# Patient Record
Sex: Male | Born: 1955 | Race: Black or African American | Hispanic: No | Marital: Married | Smoking: Current every day smoker
Health system: Southern US, Community
[De-identification: ages and names within clinical notes are randomized; demographics above are authoritative.]

## PROBLEM LIST (undated history)

## (undated) DIAGNOSIS — Z992 Dependence on renal dialysis: Secondary | ICD-10-CM

## (undated) DIAGNOSIS — I10 Essential (primary) hypertension: Secondary | ICD-10-CM

## (undated) DIAGNOSIS — I509 Heart failure, unspecified: Secondary | ICD-10-CM

## (undated) DIAGNOSIS — J189 Pneumonia, unspecified organism: Secondary | ICD-10-CM

## (undated) DIAGNOSIS — Z87442 Personal history of urinary calculi: Secondary | ICD-10-CM

## (undated) DIAGNOSIS — D649 Anemia, unspecified: Secondary | ICD-10-CM

## (undated) DIAGNOSIS — J449 Chronic obstructive pulmonary disease, unspecified: Secondary | ICD-10-CM

## (undated) DIAGNOSIS — N289 Disorder of kidney and ureter, unspecified: Secondary | ICD-10-CM

## (undated) DIAGNOSIS — N200 Calculus of kidney: Secondary | ICD-10-CM

## (undated) HISTORY — PX: EXTRACORPOREAL SHOCK WAVE LITHOTRIPSY: SHX1557

## (undated) HISTORY — DX: Chronic obstructive pulmonary disease, unspecified: J44.9

## (undated) HISTORY — PX: PROSTATE BIOPSY: SHX241

## (undated) HISTORY — PX: LITHOTRIPSY: SUR834

## (undated) HISTORY — DX: Calculus of kidney: N20.0

## (undated) SURGERY — COLONOSCOPY
Anesthesia: General

---

## 2012-04-13 DIAGNOSIS — N281 Cyst of kidney, acquired: Secondary | ICD-10-CM | POA: Insufficient documentation

## 2012-08-18 DIAGNOSIS — N184 Chronic kidney disease, stage 4 (severe): Secondary | ICD-10-CM | POA: Diagnosis present

## 2012-08-18 DIAGNOSIS — R972 Elevated prostate specific antigen [PSA]: Secondary | ICD-10-CM | POA: Diagnosis present

## 2012-08-18 DIAGNOSIS — M109 Gout, unspecified: Secondary | ICD-10-CM | POA: Insufficient documentation

## 2012-08-18 DIAGNOSIS — I10 Essential (primary) hypertension: Secondary | ICD-10-CM | POA: Diagnosis present

## 2014-12-13 DIAGNOSIS — Z9289 Personal history of other medical treatment: Secondary | ICD-10-CM | POA: Insufficient documentation

## 2015-02-04 DIAGNOSIS — N186 End stage renal disease: Secondary | ICD-10-CM | POA: Insufficient documentation

## 2015-02-04 DIAGNOSIS — E213 Hyperparathyroidism, unspecified: Secondary | ICD-10-CM | POA: Insufficient documentation

## 2015-02-04 DIAGNOSIS — D631 Anemia in chronic kidney disease: Secondary | ICD-10-CM | POA: Insufficient documentation

## 2015-02-04 DIAGNOSIS — N189 Chronic kidney disease, unspecified: Secondary | ICD-10-CM

## 2015-02-04 DIAGNOSIS — Z992 Dependence on renal dialysis: Secondary | ICD-10-CM | POA: Insufficient documentation

## 2015-04-30 DIAGNOSIS — I770 Arteriovenous fistula, acquired: Secondary | ICD-10-CM | POA: Insufficient documentation

## 2015-08-05 DIAGNOSIS — Z01818 Encounter for other preprocedural examination: Secondary | ICD-10-CM | POA: Insufficient documentation

## 2015-08-05 DIAGNOSIS — N401 Enlarged prostate with lower urinary tract symptoms: Secondary | ICD-10-CM | POA: Insufficient documentation

## 2015-09-18 DIAGNOSIS — C61 Malignant neoplasm of prostate: Secondary | ICD-10-CM | POA: Insufficient documentation

## 2016-04-13 DIAGNOSIS — N186 End stage renal disease: Secondary | ICD-10-CM | POA: Diagnosis not present

## 2016-04-13 DIAGNOSIS — Z992 Dependence on renal dialysis: Secondary | ICD-10-CM | POA: Diagnosis not present

## 2016-04-25 DIAGNOSIS — R972 Elevated prostate specific antigen [PSA]: Secondary | ICD-10-CM | POA: Diagnosis not present

## 2016-04-25 DIAGNOSIS — C61 Malignant neoplasm of prostate: Secondary | ICD-10-CM | POA: Diagnosis not present

## 2016-04-25 DIAGNOSIS — N186 End stage renal disease: Secondary | ICD-10-CM | POA: Diagnosis not present

## 2016-05-27 DIAGNOSIS — M7022 Olecranon bursitis, left elbow: Secondary | ICD-10-CM | POA: Diagnosis not present

## 2016-05-27 DIAGNOSIS — M7020 Olecranon bursitis, unspecified elbow: Secondary | ICD-10-CM | POA: Insufficient documentation

## 2016-05-27 DIAGNOSIS — M25522 Pain in left elbow: Secondary | ICD-10-CM | POA: Diagnosis not present

## 2016-07-11 DIAGNOSIS — J440 Chronic obstructive pulmonary disease with acute lower respiratory infection: Secondary | ICD-10-CM | POA: Diagnosis not present

## 2016-07-11 DIAGNOSIS — J189 Pneumonia, unspecified organism: Secondary | ICD-10-CM | POA: Diagnosis not present

## 2016-07-11 DIAGNOSIS — J441 Chronic obstructive pulmonary disease with (acute) exacerbation: Secondary | ICD-10-CM | POA: Diagnosis not present

## 2016-07-11 DIAGNOSIS — I120 Hypertensive chronic kidney disease with stage 5 chronic kidney disease or end stage renal disease: Secondary | ICD-10-CM | POA: Diagnosis not present

## 2016-07-11 DIAGNOSIS — N186 End stage renal disease: Secondary | ICD-10-CM | POA: Diagnosis not present

## 2016-07-11 DIAGNOSIS — R0602 Shortness of breath: Secondary | ICD-10-CM | POA: Diagnosis not present

## 2016-07-12 DIAGNOSIS — N186 End stage renal disease: Secondary | ICD-10-CM | POA: Diagnosis present

## 2016-07-12 DIAGNOSIS — I120 Hypertensive chronic kidney disease with stage 5 chronic kidney disease or end stage renal disease: Secondary | ICD-10-CM | POA: Diagnosis present

## 2016-07-12 DIAGNOSIS — Z992 Dependence on renal dialysis: Secondary | ICD-10-CM | POA: Diagnosis not present

## 2016-07-12 DIAGNOSIS — J441 Chronic obstructive pulmonary disease with (acute) exacerbation: Secondary | ICD-10-CM | POA: Diagnosis present

## 2016-07-12 DIAGNOSIS — D649 Anemia, unspecified: Secondary | ICD-10-CM | POA: Diagnosis present

## 2016-07-12 DIAGNOSIS — R0602 Shortness of breath: Secondary | ICD-10-CM | POA: Diagnosis not present

## 2016-07-12 DIAGNOSIS — Z85528 Personal history of other malignant neoplasm of kidney: Secondary | ICD-10-CM | POA: Diagnosis not present

## 2016-07-12 DIAGNOSIS — J440 Chronic obstructive pulmonary disease with acute lower respiratory infection: Secondary | ICD-10-CM | POA: Diagnosis present

## 2016-07-12 DIAGNOSIS — J189 Pneumonia, unspecified organism: Secondary | ICD-10-CM | POA: Diagnosis present

## 2016-07-12 DIAGNOSIS — M109 Gout, unspecified: Secondary | ICD-10-CM | POA: Diagnosis present

## 2016-07-12 DIAGNOSIS — F17210 Nicotine dependence, cigarettes, uncomplicated: Secondary | ICD-10-CM | POA: Diagnosis present

## 2016-07-12 DIAGNOSIS — E875 Hyperkalemia: Secondary | ICD-10-CM | POA: Diagnosis not present

## 2016-07-12 DIAGNOSIS — Z8546 Personal history of malignant neoplasm of prostate: Secondary | ICD-10-CM | POA: Diagnosis not present

## 2016-07-14 DIAGNOSIS — J449 Chronic obstructive pulmonary disease, unspecified: Secondary | ICD-10-CM | POA: Diagnosis present

## 2016-07-22 DIAGNOSIS — J181 Lobar pneumonia, unspecified organism: Secondary | ICD-10-CM | POA: Diagnosis not present

## 2016-07-22 DIAGNOSIS — Z6826 Body mass index (BMI) 26.0-26.9, adult: Secondary | ICD-10-CM | POA: Diagnosis not present

## 2016-08-24 DIAGNOSIS — D631 Anemia in chronic kidney disease: Secondary | ICD-10-CM | POA: Diagnosis not present

## 2016-08-24 DIAGNOSIS — N189 Chronic kidney disease, unspecified: Secondary | ICD-10-CM | POA: Diagnosis not present

## 2016-08-26 DIAGNOSIS — D631 Anemia in chronic kidney disease: Secondary | ICD-10-CM | POA: Diagnosis not present

## 2016-08-26 DIAGNOSIS — N189 Chronic kidney disease, unspecified: Secondary | ICD-10-CM | POA: Diagnosis not present

## 2016-09-02 DIAGNOSIS — C61 Malignant neoplasm of prostate: Secondary | ICD-10-CM | POA: Diagnosis not present

## 2016-09-02 DIAGNOSIS — J449 Chronic obstructive pulmonary disease, unspecified: Secondary | ICD-10-CM | POA: Diagnosis not present

## 2016-09-02 DIAGNOSIS — R972 Elevated prostate specific antigen [PSA]: Secondary | ICD-10-CM | POA: Diagnosis not present

## 2016-09-02 DIAGNOSIS — Z992 Dependence on renal dialysis: Secondary | ICD-10-CM | POA: Diagnosis not present

## 2016-09-02 DIAGNOSIS — C649 Malignant neoplasm of unspecified kidney, except renal pelvis: Secondary | ICD-10-CM | POA: Diagnosis not present

## 2016-09-02 DIAGNOSIS — N401 Enlarged prostate with lower urinary tract symptoms: Secondary | ICD-10-CM | POA: Diagnosis not present

## 2016-09-02 DIAGNOSIS — D631 Anemia in chronic kidney disease: Secondary | ICD-10-CM | POA: Diagnosis not present

## 2016-09-02 DIAGNOSIS — I10 Essential (primary) hypertension: Secondary | ICD-10-CM | POA: Diagnosis not present

## 2016-09-02 DIAGNOSIS — N186 End stage renal disease: Secondary | ICD-10-CM | POA: Diagnosis not present

## 2016-09-02 DIAGNOSIS — Z6826 Body mass index (BMI) 26.0-26.9, adult: Secondary | ICD-10-CM | POA: Diagnosis not present

## 2016-09-02 DIAGNOSIS — E213 Hyperparathyroidism, unspecified: Secondary | ICD-10-CM | POA: Diagnosis not present

## 2016-09-13 DIAGNOSIS — N186 End stage renal disease: Secondary | ICD-10-CM | POA: Diagnosis not present

## 2016-09-13 DIAGNOSIS — D631 Anemia in chronic kidney disease: Secondary | ICD-10-CM | POA: Diagnosis not present

## 2016-09-14 DIAGNOSIS — N186 End stage renal disease: Secondary | ICD-10-CM | POA: Diagnosis not present

## 2016-09-14 DIAGNOSIS — D631 Anemia in chronic kidney disease: Secondary | ICD-10-CM | POA: Diagnosis not present

## 2016-12-06 DIAGNOSIS — D631 Anemia in chronic kidney disease: Secondary | ICD-10-CM | POA: Diagnosis not present

## 2016-12-06 DIAGNOSIS — N189 Chronic kidney disease, unspecified: Secondary | ICD-10-CM | POA: Diagnosis not present

## 2016-12-07 DIAGNOSIS — N189 Chronic kidney disease, unspecified: Secondary | ICD-10-CM | POA: Diagnosis not present

## 2016-12-07 DIAGNOSIS — D631 Anemia in chronic kidney disease: Secondary | ICD-10-CM | POA: Diagnosis not present

## 2017-01-09 DIAGNOSIS — N529 Male erectile dysfunction, unspecified: Secondary | ICD-10-CM | POA: Insufficient documentation

## 2017-04-13 DIAGNOSIS — Z992 Dependence on renal dialysis: Secondary | ICD-10-CM | POA: Diagnosis not present

## 2017-04-13 DIAGNOSIS — N186 End stage renal disease: Secondary | ICD-10-CM | POA: Diagnosis not present

## 2017-05-01 DIAGNOSIS — N200 Calculus of kidney: Secondary | ICD-10-CM | POA: Diagnosis not present

## 2017-05-08 DIAGNOSIS — D631 Anemia in chronic kidney disease: Secondary | ICD-10-CM | POA: Diagnosis not present

## 2017-05-08 DIAGNOSIS — N039 Chronic nephritic syndrome with unspecified morphologic changes: Secondary | ICD-10-CM | POA: Diagnosis not present

## 2017-05-10 DIAGNOSIS — D649 Anemia, unspecified: Secondary | ICD-10-CM | POA: Diagnosis not present

## 2017-05-16 DIAGNOSIS — N202 Calculus of kidney with calculus of ureter: Secondary | ICD-10-CM | POA: Diagnosis not present

## 2017-05-18 DIAGNOSIS — Z992 Dependence on renal dialysis: Secondary | ICD-10-CM | POA: Diagnosis not present

## 2017-05-18 DIAGNOSIS — Z8249 Family history of ischemic heart disease and other diseases of the circulatory system: Secondary | ICD-10-CM | POA: Diagnosis not present

## 2017-05-18 DIAGNOSIS — J189 Pneumonia, unspecified organism: Secondary | ICD-10-CM | POA: Insufficient documentation

## 2017-05-18 DIAGNOSIS — Z87442 Personal history of urinary calculi: Secondary | ICD-10-CM | POA: Diagnosis not present

## 2017-05-18 DIAGNOSIS — J181 Lobar pneumonia, unspecified organism: Secondary | ICD-10-CM | POA: Diagnosis present

## 2017-05-18 DIAGNOSIS — D649 Anemia, unspecified: Secondary | ICD-10-CM | POA: Diagnosis not present

## 2017-05-18 DIAGNOSIS — I120 Hypertensive chronic kidney disease with stage 5 chronic kidney disease or end stage renal disease: Secondary | ICD-10-CM | POA: Diagnosis not present

## 2017-05-18 DIAGNOSIS — R0982 Postnasal drip: Secondary | ICD-10-CM | POA: Diagnosis not present

## 2017-05-18 DIAGNOSIS — D631 Anemia in chronic kidney disease: Secondary | ICD-10-CM | POA: Diagnosis present

## 2017-05-18 DIAGNOSIS — J440 Chronic obstructive pulmonary disease with acute lower respiratory infection: Secondary | ICD-10-CM | POA: Diagnosis not present

## 2017-05-18 DIAGNOSIS — J168 Pneumonia due to other specified infectious organisms: Secondary | ICD-10-CM | POA: Diagnosis not present

## 2017-05-18 DIAGNOSIS — N186 End stage renal disease: Secondary | ICD-10-CM | POA: Diagnosis present

## 2017-05-18 DIAGNOSIS — R0981 Nasal congestion: Secondary | ICD-10-CM | POA: Diagnosis not present

## 2017-05-18 DIAGNOSIS — N185 Chronic kidney disease, stage 5: Secondary | ICD-10-CM | POA: Diagnosis not present

## 2017-05-18 DIAGNOSIS — R0602 Shortness of breath: Secondary | ICD-10-CM | POA: Diagnosis not present

## 2017-05-18 DIAGNOSIS — J449 Chronic obstructive pulmonary disease, unspecified: Secondary | ICD-10-CM | POA: Diagnosis not present

## 2017-05-22 DIAGNOSIS — E213 Hyperparathyroidism, unspecified: Secondary | ICD-10-CM | POA: Diagnosis not present

## 2017-05-22 DIAGNOSIS — D631 Anemia in chronic kidney disease: Secondary | ICD-10-CM | POA: Diagnosis not present

## 2017-05-22 DIAGNOSIS — I770 Arteriovenous fistula, acquired: Secondary | ICD-10-CM | POA: Diagnosis not present

## 2017-05-22 DIAGNOSIS — Z992 Dependence on renal dialysis: Secondary | ICD-10-CM | POA: Diagnosis not present

## 2017-05-22 DIAGNOSIS — J449 Chronic obstructive pulmonary disease, unspecified: Secondary | ICD-10-CM | POA: Diagnosis not present

## 2017-05-22 DIAGNOSIS — Z85528 Personal history of other malignant neoplasm of kidney: Secondary | ICD-10-CM | POA: Diagnosis not present

## 2017-05-22 DIAGNOSIS — N186 End stage renal disease: Secondary | ICD-10-CM | POA: Diagnosis not present

## 2017-05-22 DIAGNOSIS — C61 Malignant neoplasm of prostate: Secondary | ICD-10-CM | POA: Diagnosis not present

## 2017-05-22 DIAGNOSIS — R972 Elevated prostate specific antigen [PSA]: Secondary | ICD-10-CM | POA: Diagnosis not present

## 2017-05-23 DIAGNOSIS — Z992 Dependence on renal dialysis: Secondary | ICD-10-CM | POA: Diagnosis not present

## 2017-05-23 DIAGNOSIS — I120 Hypertensive chronic kidney disease with stage 5 chronic kidney disease or end stage renal disease: Secondary | ICD-10-CM | POA: Diagnosis not present

## 2017-05-23 DIAGNOSIS — D631 Anemia in chronic kidney disease: Secondary | ICD-10-CM | POA: Diagnosis not present

## 2017-05-23 DIAGNOSIS — J181 Lobar pneumonia, unspecified organism: Secondary | ICD-10-CM | POA: Diagnosis not present

## 2017-05-23 DIAGNOSIS — N186 End stage renal disease: Secondary | ICD-10-CM | POA: Diagnosis not present

## 2017-05-23 DIAGNOSIS — N039 Chronic nephritic syndrome with unspecified morphologic changes: Secondary | ICD-10-CM | POA: Diagnosis not present

## 2017-05-24 DIAGNOSIS — D631 Anemia in chronic kidney disease: Secondary | ICD-10-CM | POA: Diagnosis not present

## 2017-05-24 DIAGNOSIS — N039 Chronic nephritic syndrome with unspecified morphologic changes: Secondary | ICD-10-CM | POA: Diagnosis not present

## 2017-06-06 DIAGNOSIS — N189 Chronic kidney disease, unspecified: Secondary | ICD-10-CM | POA: Diagnosis not present

## 2017-06-06 DIAGNOSIS — D631 Anemia in chronic kidney disease: Secondary | ICD-10-CM | POA: Diagnosis not present

## 2017-06-07 DIAGNOSIS — N189 Chronic kidney disease, unspecified: Secondary | ICD-10-CM | POA: Diagnosis not present

## 2017-06-07 DIAGNOSIS — D631 Anemia in chronic kidney disease: Secondary | ICD-10-CM | POA: Diagnosis not present

## 2017-06-16 DIAGNOSIS — N189 Chronic kidney disease, unspecified: Secondary | ICD-10-CM | POA: Diagnosis not present

## 2017-06-16 DIAGNOSIS — D631 Anemia in chronic kidney disease: Secondary | ICD-10-CM | POA: Diagnosis not present

## 2017-06-19 DIAGNOSIS — N189 Chronic kidney disease, unspecified: Secondary | ICD-10-CM | POA: Diagnosis not present

## 2017-06-19 DIAGNOSIS — D631 Anemia in chronic kidney disease: Secondary | ICD-10-CM | POA: Diagnosis not present

## 2017-06-21 DIAGNOSIS — N200 Calculus of kidney: Secondary | ICD-10-CM | POA: Insufficient documentation

## 2017-07-03 DIAGNOSIS — N189 Chronic kidney disease, unspecified: Secondary | ICD-10-CM | POA: Diagnosis not present

## 2017-07-03 DIAGNOSIS — D631 Anemia in chronic kidney disease: Secondary | ICD-10-CM | POA: Diagnosis not present

## 2017-07-05 DIAGNOSIS — N201 Calculus of ureter: Secondary | ICD-10-CM | POA: Diagnosis not present

## 2017-07-05 DIAGNOSIS — N200 Calculus of kidney: Secondary | ICD-10-CM | POA: Diagnosis not present

## 2017-07-17 ENCOUNTER — Encounter: Payer: Self-pay | Admitting: Emergency Medicine

## 2017-07-17 ENCOUNTER — Emergency Department
Admission: EM | Admit: 2017-07-17 | Discharge: 2017-07-17 | Disposition: A | Discharge status: Home / Self Care | Payer: BC Managed Care – PPO | Attending: Emergency Medicine | Admitting: Emergency Medicine

## 2017-07-17 ENCOUNTER — Other Ambulatory Visit: Payer: Self-pay

## 2017-07-17 ENCOUNTER — Emergency Department: Payer: BC Managed Care – PPO

## 2017-07-17 DIAGNOSIS — Z79899 Other long term (current) drug therapy: Secondary | ICD-10-CM | POA: Diagnosis not present

## 2017-07-17 DIAGNOSIS — R0602 Shortness of breath: Secondary | ICD-10-CM | POA: Diagnosis not present

## 2017-07-17 DIAGNOSIS — N186 End stage renal disease: Secondary | ICD-10-CM | POA: Insufficient documentation

## 2017-07-17 DIAGNOSIS — Z87891 Personal history of nicotine dependence: Secondary | ICD-10-CM | POA: Diagnosis not present

## 2017-07-17 DIAGNOSIS — I509 Heart failure, unspecified: Secondary | ICD-10-CM

## 2017-07-17 DIAGNOSIS — D649 Anemia, unspecified: Secondary | ICD-10-CM | POA: Diagnosis not present

## 2017-07-17 HISTORY — DX: Heart failure, unspecified: I50.9

## 2017-07-17 HISTORY — DX: Disorder of kidney and ureter, unspecified: N28.9

## 2017-07-17 LAB — CBC WITH DIFFERENTIAL/PLATELET
BASOS PCT: 1 %
Basophils Absolute: 0 10*3/uL (ref 0–0.1)
EOS ABS: 0.1 10*3/uL (ref 0–0.7)
Eosinophils Relative: 3 %
HEMATOCRIT: 21 % — AB (ref 40.0–52.0)
HEMOGLOBIN: 7.3 g/dL — AB (ref 13.0–18.0)
LYMPHS ABS: 0.5 10*3/uL — AB (ref 1.0–3.6)
Lymphocytes Relative: 14 %
MCH: 31.1 pg (ref 26.0–34.0)
MCHC: 34.8 g/dL (ref 32.0–36.0)
MCV: 89.5 fL (ref 80.0–100.0)
MONO ABS: 0.5 10*3/uL (ref 0.2–1.0)
MONOS PCT: 12 %
NEUTROS PCT: 70 %
Neutro Abs: 2.8 10*3/uL (ref 1.4–6.5)
Platelets: 143 10*3/uL — ABNORMAL LOW (ref 150–440)
RBC: 2.35 MIL/uL — ABNORMAL LOW (ref 4.40–5.90)
RDW: 15.3 % — AB (ref 11.5–14.5)
WBC: 4 10*3/uL (ref 3.8–10.6)

## 2017-07-17 LAB — BASIC METABOLIC PANEL
Anion gap: 8 (ref 5–15)
BUN: 28 mg/dL — AB (ref 6–20)
CALCIUM: 9.1 mg/dL (ref 8.9–10.3)
CHLORIDE: 102 mmol/L (ref 101–111)
CO2: 31 mmol/L (ref 22–32)
CREATININE: 6.64 mg/dL — AB (ref 0.61–1.24)
GFR calc Af Amer: 9 mL/min — ABNORMAL LOW (ref >60)
GFR calc non Af Amer: 8 mL/min — ABNORMAL LOW (ref >60)
Glucose, Bld: 89 mg/dL (ref 65–99)
Potassium: 4 mmol/L (ref 3.5–5.1)
SODIUM: 141 mmol/L (ref 135–145)

## 2017-07-17 LAB — TROPONIN I: Troponin I: 0.03 ng/mL (ref <0.03)

## 2017-07-17 LAB — BRAIN NATRIURETIC PEPTIDE: B NATRIURETIC PEPTIDE 5: 2204 pg/mL — AB (ref 0.0–100.0)

## 2017-07-17 NOTE — ED Provider Notes (Signed)
[facility] Emergency Department Provider Note  ____________________________________________   First MD Initiated Contact with Patient 07/17/17 1802     (approximate)  I have reviewed the triage vital signs and the nursing notes.   HISTORY  Chief Complaint Shortness of Breath    HPI Dwayne Reed is a 62 y.o. male whose medical history notably includes end-stage renal disease on hemodialysis (Monday, Wednesday, and Friday) and history of CHF who presents for evaluation of shortness of breath.  He states he has been having episodes at night few times a week where he feels very short of breath and thinks is an asthma attack.  He has an Advair inhaler that he uses that does make it better, but describes it as severe last night.  He went to dialysis this morning and has not had any shortness of breath since that time.  He is ambulatory without difficulty currently.  He denies having any chest pain.  He denies nausea, vomiting, abdominal pain.  Nothing in particular made his symptoms worse and Advair to make it a little bit better.  He denies any recent illness including cough, fever, and chills.  Past Medical History:  Diagnosis Date  . CHF (congestive heart failure) ([place])   . Renal disorder    ESRD on HD (M,W,F)    There are no active problems to display for this patient.   History reviewed. No pertinent surgical history.  Prior to Admission medications   Medication Sig Start Date End Date Taking? Authorizing Provider  albuterol (PROVENTIL HFA;VENTOLIN HFA) 108 (90 Base) MCG/ACT inhaler Inhale 2 puffs into the lungs every 6 (six) hours as needed. 05/20/17  Yes [provider]  amLODipine (NORVASC) 5 MG tablet Take 1 tablet by mouth daily. 06/21/17  Yes [provider]  AURYXIA 1 GM 210 MG(Fe) tablet Take 1 tablet by mouth 3 (three) times daily. 06/26/17  Yes [provider]  carvedilol (COREG) 25 MG tablet Take 1 tablet by mouth 2  (two) times daily. 06/21/17  Yes [provider]  Multiple Vitamins-Minerals (MULTIVITAMIN WITH MINERALS) tablet Take 1 tablet by mouth daily.   Yes [provider]  sodium bicarbonate 650 MG tablet Take 1 tablet by mouth 2 (two) times daily. 07/07/17  Yes [provider]    Allergies Patient has no allergy information on record.  History reviewed. No pertinent family history.  Social History Social History   Tobacco Use  . Smoking status: Former Research scientist (life sciences)  . Smokeless tobacco: Never Used  Substance Use Topics  . Alcohol use: Never    Frequency: Never  . Drug use: Never    Review of Systems Constitutional: No fever/chills Eyes: No visual changes. ENT: No sore throat. Cardiovascular: Denies chest pain. Respiratory: Acute shortness of breath episodes typically at night over the last week or more.  Currently no symptoms Gastrointestinal: No abdominal pain.  No nausea, no vomiting.  No diarrhea.  No constipation. Genitourinary: Negative for dysuria. Musculoskeletal: Negative for neck pain.  Negative for back pain. Integumentary: Negative for rash. Neurological: Negative for headaches, focal weakness or numbness.   ____________________________________________   PHYSICAL EXAM:  VITAL SIGNS: ED Triage Vitals  Enc Vitals Group     BP 07/17/17 1522 (!) 156/93     Pulse Rate 07/17/17 1522 86     Resp 07/17/17 1522 20     Temp 07/17/17 1522 98.1 F (36.7 C)     Temp Source 07/17/17 1522 Oral     SpO2 07/17/17  1522 96 %     Weight 07/17/17 1523 86.2 kg (190 lb)     Height 07/17/17 1523 1.829 m (6')     Head Circumference --      Peak Flow --      Pain Score 07/17/17 1531 0     Pain Loc --      Pain Edu? --      Excl. in [place]? --     Constitutional: Alert and oriented. Well appearing and in no acute distress. Eyes: Conjunctivae are normal.  Head: Atraumatic. Nose: No congestion/rhinnorhea. Mouth/Throat: Mucous membranes are moist. Neck: No  stridor.  No meningeal signs.   Cardiovascular: Normal rate, regular rhythm. Good peripheral circulation. Grossly normal heart sounds. Respiratory: Normal respiratory effort.  No retractions. Lungs CTAB. Gastrointestinal: Soft and nontender. No distention.  Musculoskeletal: No lower extremity tenderness nor edema. No gross deformities of extremities. Neurologic:  Normal speech and language. No gross focal neurologic deficits are appreciated.  Skin:  Skin is warm, dry and intact. No rash noted. Psychiatric: Mood and affect are normal. Speech and behavior are normal.  ____________________________________________   LABS (all labs ordered are listed, but only abnormal results are displayed)  Labs Reviewed  BRAIN NATRIURETIC PEPTIDE - Abnormal; Notable for the following components:      Result Value   B Natriuretic Peptide 2,204.0 (*)    All other components within normal limits  TROPONIN I - Abnormal; Notable for the following components:   Troponin I 0.03 (*)    All other components within normal limits  CBC WITH DIFFERENTIAL/PLATELET - Abnormal; Notable for the following components:   RBC 2.35 (*)    Hemoglobin 7.3 (*)    HCT 21.0 (*)    RDW 15.3 (*)    Platelets 143 (*)    Lymphs Abs 0.5 (*)    All other components within normal limits  BASIC METABOLIC PANEL - Abnormal; Notable for the following components:   BUN 28 (*)    Creatinine, Ser 6.64 (*)    GFR calc non Af Amer 8 (*)    GFR calc Af Amer 9 (*)    All other components within normal limits   ____________________________________________  EKG  ED ECG REPORT I, Hinda Kehr, the attending physician, personally viewed and interpreted this ECG.  Date: 07/17/2017 EKG Time: 15: 28 Rate: 84 Rhythm: normal sinus rhythm QRS Axis: Left axis deviation Intervals: normal ST/T Wave abnormalities: Deep inverted T waves in leads V3 through V6 with inverted T waves as well in leads II, III, and aVF. Narrative Interpretation:  Possible ischemic changes, but no old EKG against which to compare   ____________________________________________  RADIOLOGY I, Hinda Kehr, personally viewed and evaluated these images (plain radiographs) as part of my medical decision making, as well as reviewing the written report by the radiologist.  ED MD interpretation: Cardiomegaly with some mild interstitial edema  Official radiology report(s): Dg Chest Portable 1 View  Result Date: 07/17/2017 CLINICAL DATA:  Asthma attack last evening and this morning. Patient undergoes dialysis 3 times a week. EXAM: PORTABLE CHEST 1 VIEW COMPARISON:  None. FINDINGS: Portable semi upright view of the chest. Mild cardiac enlargement with slight uncoiling of the thoracic aorta. Minimal aortic atherosclerosis is noted. Diffuse interstitial prominence consistent with mild interstitial edema. No alveolar consolidation, effusion or pneumothorax. No acute nor suspicious osseous abnormality. IMPRESSION: Cardiomegaly with mild interstitial edema. Aortic atherosclerosis without aneurysm. Electronically Signed   By: Ashley Royalty M.D.   On: 07/17/2017  18:17    ____________________________________________   PROCEDURES  Critical Care performed: No   Procedure(s) performed:   Procedures   ____________________________________________   INITIAL IMPRESSION / ASSESSMENT AND PLAN / ED COURSE  As part of my medical decision making, I reviewed the following data within the [facility] notes reviewed and incorporated, Labs reviewed , EKG interpreted , Old chart reviewed, Radiograph reviewed  and Notes from prior ED visits    Differential diagnosis includes, but is not limited to, asthma/COPD, ACS, CHF.  The patient is very well-appearing with normal vital signs and no hypoxemia, no increased work of breathing, and clear lung sounds.  His EKG does show some possible ischemic changes and his troponin of 0.03, but he is having absolutely  no chest pain and is very well-appearing with no shortness of breath after getting dialysis this morning.  He has been having these episodes and it sounds like he may be having them the night before he goes to dialysis although he and his wife were not certain.  His labs are otherwise notable for significantly elevated BNP, but again I have no value against which to compare, and he just got dialysis this morning; that could represent either his chronic state or an elevation that occurred prior to his dialysis.  Electrolytes are within normal limits.  I had a long discussion with the patient about checking additional troponin and he does not want to do so.  He feels fine now and I explained to him that I think that that his shortness of breath is because of increased pulmonary edema in the setting of impending dialysis.  I encouraged him to continue using his inhaler which seems to help.  At this point there does not seem to be a reason for admission and he is fine with that.  We discussed repeat troponin but he does not want to check it again given that he has had no chest pain and this is been more of an ongoing issue.  I think that is understandable and appropriate.  As long as he is able to ambulate with no difficulty and no hypoxemia, he can go home and follow-up as an outpatient.  He and his wife understand agree with plan.   Clinical Course as of Jul 18 1947  Mon Jul 17, 2017  7517 Patient ambulated without any difficulty.     [CF]    Clinical Course User Index [CF] Hinda Kehr, MD    ____________________________________________  FINAL CLINICAL IMPRESSION(S) / ED DIAGNOSES  Final diagnoses:  SOB (shortness of breath)  Congestive heart failure, unspecified HF chronicity, unspecified heart failure type ([place])     MEDICATIONS GIVEN DURING THIS VISIT:  Medications - No data to display   ED Discharge Orders    None       Note:  This document was prepared using Dragon voice  recognition software and may include unintentional dictation errors.    Hinda Kehr, MD 07/17/17 1949

## 2017-07-17 NOTE — ED Triage Notes (Signed)
Pt states he had a "asthma attack" last night and this morning. Pt currently denies any pain or shortness of breath. Pt states he goes to dialysis MWF and he did go today. Pt in NAD and breathing unlabored and regular in triage.

## 2017-07-17 NOTE — Discharge Instructions (Signed)
As we discussed, your work-up was generally reassuring today.  Your EKG looks a little bit unusual but it is probably due to your chronic medical issues but unfortunately we do not have an old EKG I can switch to compare it.  Since you are feeling so much better, walking around without any difficulty or shortness of breath, we think it is appropriate for you to go home and follow-up with your outpatient doctor.  Return to the emergency department if you develop new or worsening symptoms that concern you.

## 2017-07-17 NOTE — ED Notes (Signed)
Ambulating with O2 monitoring. Result while ambulating: O2 within range of 97-100% on RA. MD made aware.

## 2017-07-19 DIAGNOSIS — D649 Anemia, unspecified: Secondary | ICD-10-CM | POA: Diagnosis not present

## 2017-07-24 ENCOUNTER — Other Ambulatory Visit: Payer: Self-pay

## 2017-07-24 ENCOUNTER — Encounter (HOSPITAL_COMMUNITY): Payer: Self-pay | Admitting: Emergency Medicine

## 2017-07-24 ENCOUNTER — Emergency Department (HOSPITAL_COMMUNITY): Payer: BC Managed Care – PPO

## 2017-07-24 DIAGNOSIS — Z87891 Personal history of nicotine dependence: Secondary | ICD-10-CM

## 2017-07-24 DIAGNOSIS — Z87442 Personal history of urinary calculi: Secondary | ICD-10-CM | POA: Diagnosis not present

## 2017-07-24 DIAGNOSIS — I160 Hypertensive urgency: Secondary | ICD-10-CM | POA: Diagnosis not present

## 2017-07-24 DIAGNOSIS — Z9114 Patient's other noncompliance with medication regimen: Secondary | ICD-10-CM

## 2017-07-24 DIAGNOSIS — Z992 Dependence on renal dialysis: Secondary | ICD-10-CM

## 2017-07-24 DIAGNOSIS — Z7951 Long term (current) use of inhaled steroids: Secondary | ICD-10-CM

## 2017-07-24 DIAGNOSIS — C61 Malignant neoplasm of prostate: Secondary | ICD-10-CM | POA: Diagnosis present

## 2017-07-24 DIAGNOSIS — N2581 Secondary hyperparathyroidism of renal origin: Secondary | ICD-10-CM | POA: Diagnosis not present

## 2017-07-24 DIAGNOSIS — I5023 Acute on chronic systolic (congestive) heart failure: Secondary | ICD-10-CM | POA: Diagnosis present

## 2017-07-24 DIAGNOSIS — I132 Hypertensive heart and chronic kidney disease with heart failure and with stage 5 chronic kidney disease, or end stage renal disease: Secondary | ICD-10-CM | POA: Diagnosis not present

## 2017-07-24 DIAGNOSIS — J449 Chronic obstructive pulmonary disease, unspecified: Secondary | ICD-10-CM | POA: Diagnosis present

## 2017-07-24 DIAGNOSIS — N186 End stage renal disease: Secondary | ICD-10-CM | POA: Diagnosis present

## 2017-07-24 DIAGNOSIS — J810 Acute pulmonary edema: Secondary | ICD-10-CM | POA: Diagnosis not present

## 2017-07-24 DIAGNOSIS — D631 Anemia in chronic kidney disease: Secondary | ICD-10-CM | POA: Diagnosis not present

## 2017-07-24 DIAGNOSIS — M898X9 Other specified disorders of bone, unspecified site: Secondary | ICD-10-CM | POA: Diagnosis present

## 2017-07-24 DIAGNOSIS — R0602 Shortness of breath: Secondary | ICD-10-CM | POA: Diagnosis not present

## 2017-07-24 NOTE — ED Triage Notes (Signed)
Pt states he is having shortness of breath but only in the evenings and at night  Pt states it started a couple of weeks ago and is worse tonight   Pt states it improves if he lays on his sides

## 2017-07-25 ENCOUNTER — Observation Stay (HOSPITAL_COMMUNITY)
Admission: EM | Admit: 2017-07-25 | Discharge: 2017-07-28 | DRG: 291 | Disposition: A | Discharge status: Home / Self Care | Payer: BC Managed Care – PPO | Attending: Internal Medicine | Admitting: Internal Medicine

## 2017-07-25 DIAGNOSIS — R0789 Other chest pain: Secondary | ICD-10-CM | POA: Diagnosis not present

## 2017-07-25 DIAGNOSIS — I10 Essential (primary) hypertension: Secondary | ICD-10-CM | POA: Diagnosis not present

## 2017-07-25 DIAGNOSIS — Z992 Dependence on renal dialysis: Secondary | ICD-10-CM | POA: Diagnosis not present

## 2017-07-25 DIAGNOSIS — N184 Chronic kidney disease, stage 4 (severe): Secondary | ICD-10-CM | POA: Diagnosis present

## 2017-07-25 DIAGNOSIS — D631 Anemia in chronic kidney disease: Secondary | ICD-10-CM | POA: Diagnosis present

## 2017-07-25 DIAGNOSIS — R0602 Shortness of breath: Secondary | ICD-10-CM | POA: Diagnosis present

## 2017-07-25 DIAGNOSIS — I160 Hypertensive urgency: Secondary | ICD-10-CM | POA: Diagnosis not present

## 2017-07-25 DIAGNOSIS — Z7951 Long term (current) use of inhaled steroids: Secondary | ICD-10-CM | POA: Diagnosis not present

## 2017-07-25 DIAGNOSIS — I120 Hypertensive chronic kidney disease with stage 5 chronic kidney disease or end stage renal disease: Secondary | ICD-10-CM | POA: Diagnosis not present

## 2017-07-25 DIAGNOSIS — J810 Acute pulmonary edema: Secondary | ICD-10-CM

## 2017-07-25 DIAGNOSIS — C61 Malignant neoplasm of prostate: Secondary | ICD-10-CM | POA: Diagnosis present

## 2017-07-25 DIAGNOSIS — N186 End stage renal disease: Secondary | ICD-10-CM | POA: Diagnosis present

## 2017-07-25 DIAGNOSIS — Z9114 Patient's other noncompliance with medication regimen: Secondary | ICD-10-CM | POA: Diagnosis not present

## 2017-07-25 DIAGNOSIS — J449 Chronic obstructive pulmonary disease, unspecified: Secondary | ICD-10-CM | POA: Diagnosis present

## 2017-07-25 DIAGNOSIS — Z87891 Personal history of nicotine dependence: Secondary | ICD-10-CM | POA: Diagnosis not present

## 2017-07-25 DIAGNOSIS — I5023 Acute on chronic systolic (congestive) heart failure: Secondary | ICD-10-CM

## 2017-07-25 DIAGNOSIS — Z87442 Personal history of urinary calculi: Secondary | ICD-10-CM | POA: Diagnosis not present

## 2017-07-25 DIAGNOSIS — R748 Abnormal levels of other serum enzymes: Secondary | ICD-10-CM | POA: Diagnosis not present

## 2017-07-25 DIAGNOSIS — R972 Elevated prostate specific antigen [PSA]: Secondary | ICD-10-CM | POA: Diagnosis present

## 2017-07-25 DIAGNOSIS — M898X9 Other specified disorders of bone, unspecified site: Secondary | ICD-10-CM | POA: Diagnosis present

## 2017-07-25 DIAGNOSIS — N2581 Secondary hyperparathyroidism of renal origin: Secondary | ICD-10-CM | POA: Diagnosis present

## 2017-07-25 DIAGNOSIS — I132 Hypertensive heart and chronic kidney disease with heart failure and with stage 5 chronic kidney disease, or end stage renal disease: Secondary | ICD-10-CM | POA: Diagnosis present

## 2017-07-25 HISTORY — DX: Essential (primary) hypertension: I10

## 2017-07-25 LAB — CBC WITH DIFFERENTIAL/PLATELET
Basophils Absolute: 0 10*3/uL (ref 0.0–0.1)
Basophils Relative: 0 %
EOS PCT: 2 %
Eosinophils Absolute: 0.1 10*3/uL (ref 0.0–0.7)
HCT: 25.1 % — ABNORMAL LOW (ref 39.0–52.0)
Hemoglobin: 8.2 g/dL — ABNORMAL LOW (ref 13.0–17.0)
LYMPHS ABS: 0.7 10*3/uL (ref 0.7–4.0)
LYMPHS PCT: 19 %
MCH: 29.5 pg (ref 26.0–34.0)
MCHC: 32.7 g/dL (ref 30.0–36.0)
MCV: 90.3 fL (ref 78.0–100.0)
Monocytes Absolute: 0.5 10*3/uL (ref 0.1–1.0)
Monocytes Relative: 14 %
Neutro Abs: 2.4 10*3/uL (ref 1.7–7.7)
Neutrophils Relative %: 65 %
PLATELETS: 133 10*3/uL — AB (ref 150–400)
RBC: 2.78 MIL/uL — AB (ref 4.22–5.81)
RDW: 15.1 % (ref 11.5–15.5)
WBC: 3.7 10*3/uL — AB (ref 4.0–10.5)

## 2017-07-25 LAB — BASIC METABOLIC PANEL
Anion gap: 15 (ref 5–15)
BUN: 37 mg/dL — AB (ref 6–20)
CHLORIDE: 101 mmol/L (ref 101–111)
CO2: 26 mmol/L (ref 22–32)
Calcium: 9.3 mg/dL (ref 8.9–10.3)
Creatinine, Ser: 8.32 mg/dL — ABNORMAL HIGH (ref 0.61–1.24)
GFR calc Af Amer: 7 mL/min — ABNORMAL LOW (ref >60)
GFR calc non Af Amer: 6 mL/min — ABNORMAL LOW (ref >60)
Glucose, Bld: 98 mg/dL (ref 65–99)
POTASSIUM: 4.3 mmol/L (ref 3.5–5.1)
SODIUM: 142 mmol/L (ref 135–145)

## 2017-07-25 LAB — TROPONIN I
Troponin I: 0.07 ng/mL (ref <0.03)
Troponin I: 0.06 ng/mL

## 2017-07-25 MED ORDER — DOXAZOSIN MESYLATE 2 MG PO TABS
2.0000 mg | ORAL_TABLET | Freq: Every day | ORAL | Status: DC
  Administered 2017-07-25 – 2017-07-28 (×4): 2 mg via ORAL
  Filled 2017-07-25 (×4): qty 1

## 2017-07-25 MED ORDER — AMLODIPINE BESYLATE 5 MG PO TABS: 5.0000 mg | ORAL_TABLET | Freq: Once | ORAL | Status: DC

## 2017-07-25 MED ORDER — CARVEDILOL 12.5 MG PO TABS
12.5000 mg | ORAL_TABLET | Freq: Two times a day (BID) | ORAL | Status: DC
  Administered 2017-07-25 (×2): 12.5 mg via ORAL
  Filled 2017-07-25 (×3): qty 1

## 2017-07-25 MED ORDER — HYDRALAZINE HCL 25 MG PO TABS
25.0000 mg | ORAL_TABLET | Freq: Once | ORAL | Status: DC
  Filled 2017-07-25: qty 1

## 2017-07-25 MED ORDER — MOMETASONE FURO-FORMOTEROL FUM 100-5 MCG/ACT IN AERO
2.0000 | INHALATION_SPRAY | Freq: Two times a day (BID) | RESPIRATORY_TRACT | Status: DC
Start: 2017-07-25 — End: 2017-07-28
  Administered 2017-07-25 – 2017-07-27 (×3): 2 via RESPIRATORY_TRACT
  Filled 2017-07-25: qty 8.8

## 2017-07-25 MED ORDER — ZINC SULFATE 220 (50 ZN) MG PO CAPS
220.0000 mg | ORAL_CAPSULE | Freq: Every day | ORAL | Status: DC
  Administered 2017-07-25 – 2017-07-28 (×4): 220 mg via ORAL
  Filled 2017-07-25 (×4): qty 1

## 2017-07-25 MED ORDER — ASPIRIN EC 81 MG PO TBEC
81.0000 mg | DELAYED_RELEASE_TABLET | Freq: Every day | ORAL | Status: DC
Start: 2017-07-25 — End: 2017-07-28
  Administered 2017-07-25 – 2017-07-28 (×4): 81 mg via ORAL
  Filled 2017-07-25 (×4): qty 1

## 2017-07-25 MED ORDER — AMLODIPINE BESYLATE 5 MG PO TABS
5.0000 mg | ORAL_TABLET | Freq: Every day | ORAL | Status: DC
  Administered 2017-07-25 – 2017-07-28 (×4): 5 mg via ORAL
  Filled 2017-07-25 (×4): qty 1

## 2017-07-25 MED ORDER — CARVEDILOL 25 MG PO TABS: 25.0000 mg | ORAL_TABLET | Freq: Two times a day (BID) | ORAL | Status: DC

## 2017-07-25 MED ORDER — ZINC GLUCONATE 50 MG PO TABS: 50.0000 mg | ORAL_TABLET | Freq: Every day | ORAL | Status: DC

## 2017-07-25 MED ORDER — HYDRALAZINE HCL 50 MG PO TABS
50.0000 mg | ORAL_TABLET | Freq: Once | ORAL | Status: AC
  Administered 2017-07-25: 50 mg via ORAL
  Filled 2017-07-25: qty 1

## 2017-07-25 MED ORDER — CINACALCET HCL 30 MG PO TABS
30.0000 mg | ORAL_TABLET | ORAL | Status: DC
  Administered 2017-07-25 – 2017-07-27 (×2): 30 mg via ORAL
  Filled 2017-07-25 (×4): qty 1

## 2017-07-25 MED ORDER — NITROGLYCERIN 2 % TD OINT
1.0000 [in_us] | TOPICAL_OINTMENT | Freq: Four times a day (QID) | TRANSDERMAL | Status: DC
  Administered 2017-07-25 – 2017-07-28 (×6): 1 [in_us] via TOPICAL
  Filled 2017-07-25: qty 30

## 2017-07-25 NOTE — Progress Notes (Signed)
Pt admitted to room 5 MW 21 from [place] via [employer].  Pt alert and oriented X 4.  Denies pain. Pleasant.

## 2017-07-25 NOTE — ED Notes (Signed)
Report given to RN

## 2017-07-25 NOTE — ED Notes (Signed)
Attempt report x1  

## 2017-07-25 NOTE — ED Provider Notes (Signed)
[facility] DEPT Provider Note   CSN: [id number] Arrival date & time: 07/24/17  2131     History   Chief Complaint Chief Complaint  Patient presents with  . Shortness of Breath    HPI Dwayne Reed is a 62 y.o. male.  HPI  62 year old male with history of end-stage renal disease on hemodialysis M W F, COPD comes in with chief complaint of shortness of breath.  Patient states that over the past several weeks he has been having shortness of breath when he is sleeping.  He frequently wakes up around 1 or 2 in the morning with shortness of breath that improves when he sits up or gets cold air.  Patient does not have any known history of CHF.  He does not think his events are related to his dialysis days.   Patient came into the ER today because he started having shortness of breath while he was sitting up in the evening yesterday.  Patient describes symptoms as air hunger.  He denies any wheezing, cough.  It also appears based on history that patient might not be taking his medications as prescribed.  He specifically does not appear to be taking his night doses as prescribed.  Past Medical History:  Diagnosis Date  . CHF (congestive heart failure) ([place])   . Hypertension   . Renal disorder    ESRD on HD (M,W,F)    Patient Active Problem List   Diagnosis Date Noted  . SOB (shortness of breath) 07/25/2017    History reviewed. No pertinent surgical history.      Home Medications    Prior to Admission medications   Medication Sig Start Date End Date Taking? Authorizing Provider  albuterol (PROVENTIL HFA;VENTOLIN HFA) 108 (90 Base) MCG/ACT inhaler Inhale 2 puffs into the lungs every 6 (six) hours as needed for wheezing.  05/20/17  Yes [provider]  amLODipine (NORVASC) 5 MG tablet Take 1 tablet by mouth daily. 06/21/17  Yes [provider]  AURYXIA 1 GM 210 MG(Fe) tablet Take 1 tablet by mouth 3 (three) times daily. 06/26/17  Yes  [provider]  carvedilol (COREG) 25 MG tablet Take 1 tablet by mouth 2 (two) times daily. 06/21/17  Yes [provider]  cinacalcet (SENSIPAR) 30 MG tablet Take 1 tablet by mouth as directed. Tuesday, Thursday and saturday 05/25/15  Yes [provider]  doxazosin (CARDURA) 4 MG tablet Take 2 mg by mouth daily.   Yes [provider]  Fluticasone-Salmeterol (ADVAIR) 100-50 MCG/DOSE AEPB Inhale 1 puff into the lungs 2 (two) times daily. 05/20/17  Yes [provider]  zinc gluconate 50 MG tablet Take 50 mg by mouth daily.   Yes [provider]    Family History History reviewed. No pertinent family history.  Social History Social History   Tobacco Use  . Smoking status: Former Research scientist (life sciences)  . Smokeless tobacco: Never Used  Substance Use Topics  . Alcohol use: Yes    Frequency: Never    Comment: on holidays  . Drug use: Never     Allergies   Patient has no known allergies.   Review of Systems Review of Systems  Constitutional: Positive for activity change.  Respiratory: Positive for shortness of breath.   Cardiovascular: Negative for chest pain.  Gastrointestinal: Negative for anal bleeding.  All other systems reviewed and are negative.    Physical Exam Updated Vital Signs BP (!) 169/94   Pulse 79   Temp 98 F (  36.7 C) (Oral)   Resp 16   Ht 6' (1.829 m)   Wt 86.2 kg (190 lb)   SpO2 93%   BMI 25.77 kg/m   Physical Exam  Constitutional: He is oriented to person, place, and time. He appears well-developed.  HENT:  Head: Atraumatic.  Neck: Neck supple.  Cardiovascular: Normal rate.  Pulmonary/Chest: Effort normal. Tachypnea noted. He has no wheezes. He has no rhonchi. He has no rales.  Neurological: He is alert and oriented to person, place, and time.  Skin: Skin is warm.  Nursing note and vitals reviewed.    ED Treatments / Results  Labs (all labs ordered are listed, but only abnormal results are  displayed) Labs Reviewed  BASIC METABOLIC PANEL - Abnormal; Notable for the following components:      Result Value   BUN 37 (*)    Creatinine, Ser 8.32 (*)    GFR calc non Af Amer 6 (*)    GFR calc Af Amer 7 (*)    All other components within normal limits  CBC WITH DIFFERENTIAL/PLATELET - Abnormal; Notable for the following components:   WBC 3.7 (*)    RBC 2.78 (*)    Hemoglobin 8.2 (*)    HCT 25.1 (*)    Platelets 133 (*)    All other components within normal limits  TROPONIN I - Abnormal; Notable for the following components:   Troponin I 0.07 (*)    All other components within normal limits    EKG EKG Interpretation  Date/Time:  Monday Jul 24 2017 21:50:26 EDT Ventricular Rate:  86 PR Interval:    QRS Duration: 98 QT Interval:  395 QTC Calculation: 473 R Axis:   -19 Text Interpretation:  Sinus rhythm Borderline left axis deviation RSR' in V1 or V2, right VCD or RVH Repol abnrm suggests ischemia, lateral leads No acute changes No significant change since last tracing Confirmed by Varney Biles [phone]) on 07/25/2017 2:46:48 AM   Radiology Dg Chest 2 View  Result Date: 07/24/2017 CLINICAL DATA:  Subacute onset of shortness of breath. EXAM: CHEST - 2 VIEW COMPARISON:  Chest radiograph performed 07/17/2017 FINDINGS: The lungs are well-aerated. Vascular congestion is noted. Mild bibasilar airspace opacities may reflect interstitial edema. Peribronchial thickening is seen. There is no evidence of pleural effusion or pneumothorax. The heart is borderline normal in size. No acute osseous abnormalities are seen. IMPRESSION: Vascular congestion. Mild bibasilar airspace opacities may reflect interstitial edema. Peribronchial thickening noted. Electronically Signed   By: Garald Balding M.D.   On: 07/24/2017 22:40    Procedures Procedures (including critical care time)  Medications Ordered in ED Medications  carvedilol (COREG) tablet 12.5 mg (12.5 mg Oral Given 07/25/17 0811)   hydrALAZINE (APRESOLINE) tablet 50 mg (50 mg Oral Given 07/25/17 0313)     Initial Impression / Assessment and Plan / ED Course  I have reviewed the triage vital signs and the nursing notes.  Pertinent labs & imaging results that were available during my care of the patient were reviewed by me and considered in my medical decision making (see chart for details).  Clinical Course as of Jul 25 924  Tue Jul 25, 2017  0925 Patient reassessed.  His blood pressure is improved but he feels like his tachypnea is getting worse again.  I will consult medicine to have patient admitted for optimal blood pressure management.   [AN]    Clinical Course User Index [AN] Varney Biles, MD    62 year old male comes  in with chief complaint of shortness of breath.  Patient is noted to be hypertensive, but not in respiratory distress by me.  He is slightly tachypneic, but states that his symptoms that were home were a lot worse.  It seems like patient has been having episodes of respiratory distress, mostly at nighttime.  He has been noncompliant with his night blood pressure medication.  In the setting of him being hypertensive in the ER I think he is having flash pulmonary edema is at nighttime.  Since patient is looking more comfortable, we have decided to give him his oral meds and reassess him.  Chest x-ray does show pulmonary congestion, which means there is an element of pulmonary edema likely due to his hypertension and not because of volume overload since he is up-to-date with his dialysis..   Final Clinical Impressions(s) / ED Diagnoses   Final diagnoses:  Hypertensive urgency  Acute pulmonary edema [facility])    ED Discharge Orders    None       Varney Biles, MD 07/25/17 [phone]

## 2017-07-25 NOTE — Consult Note (Signed)
Reason for Consult: Congestive  heart failure/mildly elevated troponin I Referring Physician:  Kennth Vanbenschoten is an 62 y.o. male.  HPI: patient is 62 year old male with past medical history significant for hypertension, end-stage renal disease on hemodialysis Monday Wednesday Friday in [facility], history of tobacco abuse one third pack per day for 20+ years quit about 8 months ago, history of questionable congestive heart failure, history of prostate cancer, history of kidney stones came to the ER because of progressive increasing shortness of breath for last 3-4 weeks. Patient states his breathing gradually got worse so decided to come to ED also complains of vague retrosternal chest discomfort/tired feeling. Patient does skilled history of PND orthopnea and occasional leg swelling. Denies any palpitation lightheadedness or syncope. Denies any cardiac workup in the past. Patient was noted to have minimally elevated troponin I and BNP above 2000. EKG done in the ED showed normal sinus rhythm with ST-T wave changes in anterolateral and inferior leads.  Past Medical History:  Diagnosis Date  . CHF (congestive heart failure) ([place])   . Hypertension   . Renal disorder    ESRD on HD (M,W,F)    History reviewed. No pertinent surgical history.  History reviewed. No pertinent family history.  Social History:  reports that he has quit smoking. He has never used smokeless tobacco. He reports that he drinks alcohol. He reports that he does not use drugs.  Allergies: No Known Allergies  Medications: I have reviewed the patient's current medications.  Results for orders placed or performed during the hospital encounter of 07/25/17 (from the past 48 hour(s))  Basic metabolic panel     Status: Abnormal   Collection Time: 07/25/17  3:14 AM  Result Value Ref Range   Sodium 142 135 - 145 mmol/L   Potassium 4.3 3.5 - 5.1 mmol/L   Chloride 101 101 - 111 mmol/L   CO2 26 22 - 32 mmol/L   Glucose, Bld 98 65 -  99 mg/dL   BUN 37 (H) 6 - 20 mg/dL   Creatinine, Ser 8.32 (H) 0.61 - 1.24 mg/dL   Calcium 9.3 8.9 - 10.3 mg/dL   GFR calc non Af Amer 6 (L) >60 mL/min   GFR calc Af Amer 7 (L) >60 mL/min    Comment: (NOTE) The eGFR has been calculated using the CKD EPI equation. This calculation has not been validated in all clinical situations. eGFR's persistently <60 mL/min signify possible Chronic Kidney Disease.    Anion gap 15 5 - 15    Comment: Performed at [facility], [place] [address], [place],  [address]  CBC with Differential     Status: Abnormal   Collection Time: 07/25/17  3:14 AM  Result Value Ref Range   WBC 3.7 (L) 4.0 - 10.5 K/uL   RBC 2.78 (L) 4.22 - 5.81 MIL/uL   Hemoglobin 8.2 (L) 13.0 - 17.0 g/dL   HCT 25.1 (L) 39.0 - 52.0 %   MCV 90.3 78.0 - 100.0 fL   MCH 29.5 26.0 - 34.0 pg   MCHC 32.7 30.0 - 36.0 g/dL   RDW 15.1 11.5 - 15.5 %   Platelets 133 (L) 150 - 400 K/uL   Neutrophils Relative % 65 %   Neutro Abs 2.4 1.7 - 7.7 K/uL   Lymphocytes Relative 19 %   Lymphs Abs 0.7 0.7 - 4.0 K/uL   Monocytes Relative 14 %   Monocytes Absolute 0.5 0.1 - 1.0 K/uL   Eosinophils Relative 2 %  Eosinophils Absolute 0.1 0.0 - 0.7 K/uL   Basophils Relative 0 %   Basophils Absolute 0.0 0.0 - 0.1 K/uL    Comment: Performed at [facility], [place] [address], [place], [place] [address]  Troponin I     Status: Abnormal   Collection Time: 07/25/17  3:14 AM  Result Value Ref Range   Troponin I 0.07 (HH) <0.03 ng/mL    Comment: CRITICAL RESULT CALLED TO, READ BACK BY AND VERIFIED WITH: J NASH RN [id number] 07/25/17 A NAVARRO Performed at [facility], [place] [address], [place], [place] [address]   Troponin I (q 6hr x 3)     Status: Abnormal   Collection Time: 07/25/17  1:37 PM  Result Value Ref Range   Troponin I 0.06 (HH) <0.03 ng/mL    Comment: CRITICAL VALUE NOTED.  VALUE IS CONSISTENT WITH PREVIOUSLY REPORTED AND CALLED  VALUE. Performed at [facility], [place] [address], [place], [place] [address]     Dg Chest 2 View  Result Date: 07/24/2017 CLINICAL DATA:  Subacute onset of shortness of breath. EXAM: CHEST - 2 VIEW COMPARISON:  Chest radiograph performed 07/17/2017 FINDINGS: The lungs are well-aerated. Vascular congestion is noted. Mild bibasilar airspace opacities may reflect interstitial edema. Peribronchial thickening is seen. There is no evidence of pleural effusion or pneumothorax. The heart is borderline normal in size. No acute osseous abnormalities are seen. IMPRESSION: Vascular congestion. Mild bibasilar airspace opacities may reflect interstitial edema. Peribronchial thickening noted. Electronically Signed   By: Garald Balding M.D.   On: 07/24/2017 22:40    Review of Systems  Constitutional: Negative for chills and fever.  HENT: Negative for hearing loss.   Eyes: Negative for blurred vision.  Respiratory: Positive for shortness of breath.   Cardiovascular: Positive for chest pain, orthopnea and PND.  Gastrointestinal: Negative for abdominal pain, nausea and vomiting.  Genitourinary: Negative for dysuria.  Skin: Negative for rash.  Neurological: Negative for dizziness.   Blood pressure (!) 180/94, pulse 88, temperature 98 F (36.7 C), temperature source Oral, resp. rate (!) 21, height 6' (1.829 m), weight 86.2 kg (190 lb), SpO2 94 %. Physical Exam  Constitutional: He is oriented to person, place, and time.  HENT:  Head: Normocephalic and atraumatic.  Eyes: Pupils are equal, round, and reactive to light. Conjunctivae are normal. Left eye exhibits no discharge. No scleral icterus.  Neck: Normal range of motion. Neck supple. No JVD present. No tracheal deviation present. No thyromegaly present.  Cardiovascular: Normal rate and regular rhythm.  Murmur (2/6 systolic murmur and S3 and S4 gallop noted no pericardial rub) heard. Respiratory:  Decrease breath sounds at bases with  faint rales noted  GI: Soft. Bowel sounds are normal. He exhibits no distension. There is no tenderness. There is no rebound.  Musculoskeletal: He exhibits no edema, tenderness or deformity.  Right forearm bruit noted  Neurological: He is alert and oriented to person, place, and time. No cranial nerve deficit. Coordination normal.    Assessment/Plan: . Atypical chest pain/minimally elevated troponin I rule out coronary insufficiency doubt significant MI Resolving decompensated congestive heart failure probably secondary to diastolic dysfunction Minimally elevated troponin I secondary to above Uncontrolled hypertension End-stage renal disease on hemodialysis History of prostate cancer history of kidney stones History of tobacco abuse Plan Agree with present management Add Nitropaste as per orders Check lipid panel and 2-D echo Add aspirin 81 mg daily Discussed with patient regarding various options of treatment i.e. noninvasive stress  testing versus left cardiac catheterization possible PTCA stenting its risk and benefits and agrees for noninvasive stress testing first.   Charolette Forward 07/25/2017, 4:31 PM

## 2017-07-25 NOTE — Progress Notes (Signed)
BP 175/100 gave Coreg 12.5 mg po.

## 2017-07-25 NOTE — H&P (Signed)
HPI  Dwayne Reed NKN:[id number] DOB: 1955/06/24 DOA: 07/25/2017  PCP: Beckie Salts, MD   urologist at [facility] Dr. Harlow Asa Chief Complaint: PND DOE and orthopnea  HPI:  62 year old African-American male COPD, HTN, ESRD on HD MWF He is also known to have prostate cancer under surveillance at his request and follows at [facility] for the same-last PSA was 9.4 Most recent admission 05/18/2017 with pneumonia treated with Levaquin  She just seen at [facility] ED work-up done with possible ischemic changes troponin 0 0.03 patient refused to have further troponin done given albuterol and discharged home  Returns now with shortness of breath and discomfort dyspnea on exertion and dyspnea specifically at night-states that he feels wheezy and uncomfortable and has awoken with these issues-no positional changes specific although when he lays on his right side he gets some relief- His dry weight at dialysis is 184 but his weight today is 190-he underwent dialysis for 3 and half hours as per his usual protocol yesterday  ED Course: Patient given Coreg and hydralazine for hypertensive urgency  Review of Systems:   Negative for fever, visual changes, sore throat, rash, new muscle aches, chest pain, SOB, dysuria, bleeding, n/v/abdominal pain. Does not like to use his Lorin Picket thinks it gives him nausea times--- he also does complain of insomnia over the same period of time  Past Medical History:  Diagnosis Date  . CHF (congestive heart failure) ([place])   . Hypertension   . Renal disorder    ESRD on HD (M,W,F)    History reviewed. No pertinent surgical history.   reports that he has quit smoking. He has never used smokeless tobacco. He reports that he drinks alcohol. He reports that he does not use drugs. Mobility: Mobilizes fairly well  No Known Allergies  History reviewed. No pertinent family history.   Prior to Admission medications   Medication Sig Start Date End Date Taking?  Authorizing Provider  albuterol (PROVENTIL HFA;VENTOLIN HFA) 108 (90 Base) MCG/ACT inhaler Inhale 2 puffs into the lungs every 6 (six) hours as needed for wheezing.  05/20/17  Yes [provider]  amLODipine (NORVASC) 5 MG tablet Take 1 tablet by mouth daily. 06/21/17  Yes [provider]  AURYXIA 1 GM 210 MG(Fe) tablet Take 1 tablet by mouth 3 (three) times daily. 06/26/17  Yes [provider]  carvedilol (COREG) 25 MG tablet Take 1 tablet by mouth 2 (two) times daily. 06/21/17  Yes [provider]  cinacalcet (SENSIPAR) 30 MG tablet Take 1 tablet by mouth as directed. Tuesday, Thursday and saturday 05/25/15  Yes [provider]  doxazosin (CARDURA) 4 MG tablet Take 2 mg by mouth daily.   Yes [provider]  Fluticasone-Salmeterol (ADVAIR) 100-50 MCG/DOSE AEPB Inhale 1 puff into the lungs 2 (two) times daily. 05/20/17  Yes [provider]  zinc gluconate 50 MG tablet Take 50 mg by mouth daily.   Yes [provider]    Physical Exam:  Vitals:   07/25/17 0930 07/25/17 1137  BP: (!) 168/92 (!) 171/109  Pulse: 70 71  Resp: 14 (!) 29  Temp:    SpO2: 94% 94%     Weight very pleasant coherent no distress looks younger than stated age  No icterus no pallor no arcus senilis no JVD no bruit  Chest is clear without added sound no rales no rhonchi  S1-S2 no murmur rub or gallop  Abdomen soft nontender no rebound no guarding  Neurologically intact no focal deficit  euthymic and congruent  Muscular skeletal exam is benign without any swelling of joints  Neck is soft supple without any lymphadenopathy or any swelling   I have personally reviewed following labs and imaging studies  Labs:   Labs are essentially benign except for creatinine in the 8 range which is normal for him  Hemoglobin is 8.2 platelet 133  Imaging studies:   Chest x-ray shows vascular congestion and edema  Medical tests:   EKG independently  reviewed: EKG shows T wave inversions across precordium not unchanged from 07/17/2017 however not able to compare with EKGs from [facility] which she has had done and cannot visualize those rhythm strips  Test discussed with performing physician:  Discussed with ED physician  Decision to obtain old records:   Yes  Review and summation of old records:   Viewed and extensively summarized  Active Problems:   SOB (shortness of breath)   Assessment/Plan 62 year old male with shortness of breath?  Anginal equivalent-I will repeat her troponin the trend is up going from 0.03 a couple of weeks to go to 0.07 I still feel that this is probably secondary to his hypertensive urgency and not taking his Coreg as he admits to not doing because it is twice a day-if troponin peaks then we will ask cardiology to see and evaluate-the other differential in this case is that this is pulmonary edema from under dialysis and he may need an escalation of dialysis by nephrology for getting him to a drier weight-I am not sure how he has been taking his meds once again or if he is compliant with the renal heart healthy diet Lastly he may also have some component of OSA although I think this is less likely but it may fit with his sudden awakenings at night  ESRD-I have contacted Dr. Moshe Cipro of nephrology who will see him in consult at [facility] when he arrives there from for now continue Sensipar 30 daily  Prostate cancer on surveillance as above-continue doxazosin 2 mg daily follow-up as an outpatient with his primary urologist    Severity of Illness: The appropriate patient status for this patient is OBSERVATION. Observation status is judged to be reasonable and necessary in order to provide the required intensity of service to ensure the patient's safety. The patient's presenting symptoms, physical exam findings, and initial radiographic and laboratory data in the context of their medical condition is felt  to place them at decreased risk for further clinical deterioration. Furthermore, it is anticipated that the patient will be medically stable for discharge from the hospital within 2 midnights of admission. The following factors support the patient status of observation.   " The patient's presenting symptoms include shortness of breath fatigue at night. " The physical exam findings include PND. " The initial radiographic and laboratory data are reassuring to some degree however if worsens may need to be converted to inpatient.      DVT prophylaxis: Lovenox Code Status: Code Family Communication: None present Consults called: Nephrology and potentially cardiology will be needed  Time spent: 47 minutes  Fernande Treiber, MD  [facility] Direct contact: [phone] --[place] [place]  --[email or web address]; password [email or web address]  7PM-7AM contact night coverage as above  07/25/2017, 1:04 PM

## 2017-07-25 NOTE — Progress Notes (Signed)
Pt stated that MD said they would do the stress test tomorrow but no orders present. Pt educated to go ahead and stay NPO just in case procedure is tomorrow.

## 2017-07-25 NOTE — ED Notes (Signed)
ED TO INPATIENT HANDOFF REPORT  Name/Age/Gender Dwayne Reed 62 y.o. male  Code Status    Code Status Orders  (From admission, onward)        Start     Ordered   07/25/17 0915  Full code  Continuous     07/25/17 0914    Code Status History    This patient has a current code status but no historical code status.      Home/SNF/Other Home  Chief Complaint shortness of breath   Level of Care/Admitting Diagnosis ED Disposition    ED Disposition Condition [facility] Area: [place] [100100]  Level of Care: Telemetry [5]  Diagnosis: SOB (shortness of breath) [119417]  Admitting Physician: Nita Sells [phone]  Attending Physician: Nita Sells [phone]  Estimated length of stay: 3 - 4 days  Certification:: I certify this patient will need inpatient services for at least 2 midnights  PT Class (Do Not Modify): Inpatient [101]  PT Acc Code (Do Not Modify): Private [1]       Medical History Past Medical History:  Diagnosis Date  . CHF (congestive heart failure) ([place])   . Hypertension   . Renal disorder    ESRD on HD (M,W,F)    Allergies No Known Allergies  IV Location/Drains/Wounds Patient Lines/Drains/Airways Status   Active Line/Drains/Airways    Name:   Placement date:   Placement time:   Site:   Days:   Peripheral IV 07/25/17 Left Forearm   07/25/17    0707    Forearm   less than 1          Labs/Imaging Results for orders placed or performed during the hospital encounter of 07/25/17 (from the past 48 hour(s))  Basic metabolic panel     Status: Abnormal   Collection Time: 07/25/17  3:14 AM  Result Value Ref Range   Sodium 142 135 - 145 mmol/L   Potassium 4.3 3.5 - 5.1 mmol/L   Chloride 101 101 - 111 mmol/L   CO2 26 22 - 32 mmol/L   Glucose, Bld 98 65 - 99 mg/dL   BUN 37 (H) 6 - 20 mg/dL   Creatinine, Ser 8.32 (H) 0.61 - 1.24 mg/dL   Calcium 9.3 8.9 - 10.3 mg/dL   GFR calc non Af Amer 6 (L) >60  mL/min   GFR calc Af Amer 7 (L) >60 mL/min    Comment: (NOTE) The eGFR has been calculated using the CKD EPI equation. This calculation has not been validated in all clinical situations. eGFR's persistently <60 mL/min signify possible Chronic Kidney Disease.    Anion gap 15 5 - 15    Comment: Performed at [facility], [place] [address] [place], [place] [address]  CBC with Differential     Status: Abnormal   Collection Time: 07/25/17  3:14 AM  Result Value Ref Range   WBC 3.7 (L) 4.0 - 10.5 K/uL   RBC 2.78 (L) 4.22 - 5.81 MIL/uL   Hemoglobin 8.2 (L) 13.0 - 17.0 g/dL   HCT 25.1 (L) 39.0 - 52.0 %   MCV 90.3 78.0 - 100.0 fL   MCH 29.5 26.0 - 34.0 pg   MCHC 32.7 30.0 - 36.0 g/dL   RDW 15.1 11.5 - 15.5 %   Platelets 133 (L) 150 - 400 K/uL   Neutrophils Relative % 65 %   Neutro Abs 2.4 1.7 - 7.7 K/uL   Lymphocytes Relative 19 %   Lymphs Abs  0.7 0.7 - 4.0 K/uL   Monocytes Relative 14 %   Monocytes Absolute 0.5 0.1 - 1.0 K/uL   Eosinophils Relative 2 %   Eosinophils Absolute 0.1 0.0 - 0.7 K/uL   Basophils Relative 0 %   Basophils Absolute 0.0 0.0 - 0.1 K/uL    Comment: Performed at [facility], [place] [address] [place], [place] [address]  Troponin I     Status: Abnormal   Collection Time: 07/25/17  3:14 AM  Result Value Ref Range   Troponin I 0.07 (HH) <0.03 ng/mL    Comment: CRITICAL RESULT CALLED TO, READ BACK BY AND VERIFIED WITH: J NASH RN 0407 07/25/17 A NAVARRO Performed at [facility], [place] [address] [place], [place] [address]   Troponin I (q 6hr x 3)     Status: Abnormal   Collection Time: 07/25/17  1:37 PM  Result Value Ref Range   Troponin I 0.06 (HH) <0.03 ng/mL    Comment: CRITICAL VALUE NOTED.  VALUE IS CONSISTENT WITH PREVIOUSLY REPORTED AND CALLED VALUE. Performed at [facility], [place] [address] [place], [place] [address]    Dg Chest 2 View  Result Date: 07/24/2017 CLINICAL DATA:   Subacute onset of shortness of breath. EXAM: CHEST - 2 VIEW COMPARISON:  Chest radiograph performed 07/17/2017 FINDINGS: The lungs are well-aerated. Vascular congestion is noted. Mild bibasilar airspace opacities may reflect interstitial edema. Peribronchial thickening is seen. There is no evidence of pleural effusion or pneumothorax. The heart is borderline normal in size. No acute osseous abnormalities are seen. IMPRESSION: Vascular congestion. Mild bibasilar airspace opacities may reflect interstitial edema. Peribronchial thickening noted. Electronically Signed   By: Garald Balding M.D.   On: 07/24/2017 22:40    Pending Labs Unresulted Labs (From admission, onward)   Start     Ordered   07/26/17 0500  Comprehensive metabolic panel  Tomorrow morning,   R     07/25/17 1536   07/26/17 0500  CBC  Tomorrow morning,   R     07/25/17 1536   07/26/17 0500  Lipid panel  Tomorrow morning,   R     07/25/17 1647   07/25/17 1537  HIV antibody (Routine Testing)  Once,   R     07/25/17 1536   07/25/17 1321  Troponin I (q 6hr x 3)  Now then every 6 hours,   R     07/25/17 1320      Vitals/Pain Today's Vitals   07/25/17 1600 07/25/17 1630 07/25/17 1700 07/25/17 1730  BP: (!) 180/94 (!) 158/88 (!) 164/97 (!) 171/106  Pulse: 88 82 76 73  Resp: (!) 21 (!) 25 17 (!) 28  Temp:      TempSrc:      SpO2: 94% 97% 98% 97%  Weight:      Height:      PainSc:        Isolation Precautions No active isolations  Medications Medications  carvedilol (COREG) tablet 12.5 mg (12.5 mg Oral Given 07/25/17 0811)  amLODipine (NORVASC) tablet 5 mg (5 mg Oral Given 07/25/17 1711)  cinacalcet (SENSIPAR) tablet 30 mg (30 mg Oral Given 07/25/17 1711)  doxazosin (CARDURA) tablet 2 mg (2 mg Oral Given 07/25/17 1711)  mometasone-formoterol (DULERA) 100-5 MCG/ACT inhaler 2 puff (has no administration in time range)  zinc sulfate capsule 220 mg (220 mg Oral Given 07/25/17 1711)  nitroGLYCERIN (NITROGLYN) 2 % ointment 1 inch  (has no administration in time range)  aspirin EC tablet 81 mg (81 mg Oral Given 07/25/17 1711)  hydrALAZINE (APRESOLINE) tablet 50 mg (50 mg Oral Given 07/25/17 0313)    Mobility walks

## 2017-07-25 NOTE — ED Notes (Signed)
Date and time results received: 07/25/17  0407 Test:Troponin Critical Value: 0.07  Name of Provider Notified: Kathrynn Humble Md Orders Received? Or Actions Taken?: Orders Received - See Orders for details

## 2017-07-26 ENCOUNTER — Inpatient Hospital Stay (HOSPITAL_COMMUNITY): Payer: BC Managed Care – PPO

## 2017-07-26 DIAGNOSIS — R0602 Shortness of breath: Secondary | ICD-10-CM | POA: Diagnosis not present

## 2017-07-26 DIAGNOSIS — N186 End stage renal disease: Secondary | ICD-10-CM

## 2017-07-26 DIAGNOSIS — Z992 Dependence on renal dialysis: Secondary | ICD-10-CM

## 2017-07-26 DIAGNOSIS — I5023 Acute on chronic systolic (congestive) heart failure: Secondary | ICD-10-CM

## 2017-07-26 LAB — CBC
HCT: 21.2 % — ABNORMAL LOW (ref 39.0–52.0)
Hemoglobin: 7 g/dL — ABNORMAL LOW (ref 13.0–17.0)
MCH: 29.2 pg (ref 26.0–34.0)
MCHC: 33 g/dL (ref 30.0–36.0)
MCV: 88.3 fL (ref 78.0–100.0)
Platelets: 125 10*3/uL — ABNORMAL LOW (ref 150–400)
RBC: 2.4 MIL/uL — ABNORMAL LOW (ref 4.22–5.81)
RDW: 14.6 % (ref 11.5–15.5)
WBC: 3.8 10*3/uL — ABNORMAL LOW (ref 4.0–10.5)

## 2017-07-26 LAB — RENAL FUNCTION PANEL
Albumin: 2.8 g/dL — ABNORMAL LOW (ref 3.5–5.0)
Anion gap: 13 (ref 5–15)
BUN: 64 mg/dL — ABNORMAL HIGH (ref 6–20)
CO2: 25 mmol/L (ref 22–32)
Calcium: 8.5 mg/dL — ABNORMAL LOW (ref 8.9–10.3)
Chloride: 100 mmol/L — ABNORMAL LOW (ref 101–111)
Creatinine, Ser: 12.73 mg/dL — ABNORMAL HIGH (ref 0.61–1.24)
GFR calc Af Amer: 4 mL/min — ABNORMAL LOW (ref >60)
GFR calc non Af Amer: 4 mL/min — ABNORMAL LOW (ref >60)
Glucose, Bld: 114 mg/dL — ABNORMAL HIGH (ref 65–99)
Phosphorus: 5.6 mg/dL — ABNORMAL HIGH (ref 2.5–4.6)
Potassium: 4.1 mmol/L (ref 3.5–5.1)
Sodium: 138 mmol/L (ref 135–145)

## 2017-07-26 LAB — ECHOCARDIOGRAM COMPLETE
Height: 72 in
WEIGHTICAEL: 3040 [oz_av]

## 2017-07-26 MED ORDER — HEPARIN SODIUM (PORCINE) 1000 UNIT/ML DIALYSIS
5000.0000 [IU] | Freq: Once | INTRAMUSCULAR | Status: AC
  Administered 2017-07-26: 5000 [IU] via INTRAVENOUS_CENTRAL
  Filled 2017-07-26: qty 5

## 2017-07-26 MED ORDER — DOXERCALCIFEROL 4 MCG/2ML IV SOLN
INTRAVENOUS | Status: AC
  Administered 2017-07-26: 3.5 ug via INTRAVENOUS
  Filled 2017-07-26: qty 2

## 2017-07-26 MED ORDER — REGADENOSON 0.4 MG/5ML IV SOLN
0.4000 mg | Freq: Once | INTRAVENOUS | Status: AC
Start: 2017-07-26 — End: 2017-07-26
  Administered 2017-07-26: 0.4 mg via INTRAVENOUS
  Filled 2017-07-26: qty 5

## 2017-07-26 MED ORDER — FERRIC CITRATE 1 GM 210 MG(FE) PO TABS
630.0000 mg | ORAL_TABLET | Freq: Three times a day (TID) | ORAL | Status: DC
  Administered 2017-07-26 – 2017-07-28 (×5): 630 mg via ORAL
  Filled 2017-07-26 (×8): qty 3

## 2017-07-26 MED ORDER — LIDOCAINE HCL (PF) 1 % IJ SOLN: 5.0000 mL | INTRAMUSCULAR | Status: DC | PRN

## 2017-07-26 MED ORDER — ALTEPLASE 2 MG IJ SOLR: 2.0000 mg | Freq: Once | INTRAMUSCULAR | Status: DC | PRN

## 2017-07-26 MED ORDER — TECHNETIUM TC 99M TETROFOSMIN IV KIT
30.0000 | PACK | Freq: Once | INTRAVENOUS | Status: AC | PRN
  Administered 2017-07-26: 30 via INTRAVENOUS

## 2017-07-26 MED ORDER — TECHNETIUM TC 99M TETROFOSMIN IV KIT
10.0000 | PACK | Freq: Once | INTRAVENOUS | Status: AC | PRN
Start: 2017-07-26 — End: 2017-07-26
  Administered 2017-07-26: 10 via INTRAVENOUS

## 2017-07-26 MED ORDER — SODIUM CHLORIDE 0.9 % IV SOLN: 100.0000 mL | INTRAVENOUS | Status: DC | PRN

## 2017-07-26 MED ORDER — HEPARIN SODIUM (PORCINE) 1000 UNIT/ML DIALYSIS: 1000.0000 [IU] | INTRAMUSCULAR | Status: DC | PRN

## 2017-07-26 MED ORDER — REGADENOSON 0.4 MG/5ML IV SOLN
INTRAVENOUS | Status: AC
  Filled 2017-07-26: qty 5

## 2017-07-26 MED ORDER — LIDOCAINE-PRILOCAINE 2.5-2.5 % EX CREA: 1.0000 "application " | TOPICAL_CREAM | CUTANEOUS | Status: DC | PRN

## 2017-07-26 MED ORDER — HYDRALAZINE HCL 50 MG PO TABS
50.0000 mg | ORAL_TABLET | Freq: Three times a day (TID) | ORAL | Status: DC
  Administered 2017-07-27 – 2017-07-28 (×5): 50 mg via ORAL
  Filled 2017-07-26 (×5): qty 1

## 2017-07-26 MED ORDER — DOXERCALCIFEROL 4 MCG/2ML IV SOLN
3.5000 ug | INTRAVENOUS | Status: DC
Start: 2017-07-26 — End: 2017-07-28
  Administered 2017-07-26 – 2017-07-28 (×2): 3.5 ug via INTRAVENOUS
  Filled 2017-07-26 (×2): qty 2

## 2017-07-26 MED ORDER — DARBEPOETIN ALFA 60 MCG/0.3ML IJ SOSY
60.0000 ug | PREFILLED_SYRINGE | INTRAMUSCULAR | Status: DC
  Administered 2017-07-26: 60 ug via INTRAVENOUS
  Filled 2017-07-26: qty 0.3

## 2017-07-26 MED ORDER — PENTAFLUOROPROP-TETRAFLUOROETH EX AERO
1.0000 "application " | INHALATION_SPRAY | CUTANEOUS | Status: DC | PRN
  Administered 2017-07-26: 1 via TOPICAL

## 2017-07-26 MED ORDER — CARVEDILOL 25 MG PO TABS
25.0000 mg | ORAL_TABLET | Freq: Two times a day (BID) | ORAL | Status: DC
  Administered 2017-07-27 (×3): 25 mg via ORAL
  Filled 2017-07-26 (×3): qty 1

## 2017-07-26 MED ORDER — DARBEPOETIN ALFA 60 MCG/0.3ML IJ SOSY
PREFILLED_SYRINGE | INTRAMUSCULAR | Status: AC
  Administered 2017-07-26: 60 ug via INTRAVENOUS
  Filled 2017-07-26: qty 0.3

## 2017-07-26 NOTE — Progress Notes (Signed)
HD tx initiated via 15Gx2 w/o problem, pull/push/flush well w/o problem, VSS w/ increased bp, will cont to monitor while on HD tx 

## 2017-07-26 NOTE — Progress Notes (Signed)
Subjective:  Denies any chest pain.  States breathing has improved.  Seen in nuclear medicine department.  Objective:  Vital Signs in the last 24 hours: Temp:  [97.7 F (36.5 C)-98.6 F (37 C)] 97.7 F (36.5 C) (05/15 1616) Pulse Rate:  [72-84] 73 (05/15 1616) Resp:  [15-28] 18 (05/15 1616) BP: (154-175)/(78-106) 170/94 (05/15 1616) SpO2:  [92 %-97 %] 95 % (05/15 1616)  Intake/Output from previous day: No intake/output data recorded. Intake/Output from this shift: No intake/output data recorded.  Physical Exam: Neck: no adenopathy, no carotid bruit, no JVD and supple, symmetrical, trachea midline Lungs: decreased breath sounds at bases with faint rales Heart: regular rate and rhythm, S1, S2 normal and soft systolic murmur noted Abdomen: soft, non-tender; bowel sounds normal; no masses,  no organomegaly  Lab Results: Recent Labs    07/25/17 0314  WBC 3.7*  HGB 8.2*  PLT 133*   Recent Labs    07/25/17 0314  NA 142  K 4.3  CL 101  CO2 26  GLUCOSE 98  BUN 37*  CREATININE 8.32*   Recent Labs    07/25/17 0314 07/25/17 1337  TROPONINI 0.07* 0.06*   Hepatic Function Panel No results for input(s): PROT, ALBUMIN, AST, ALT, ALKPHOS, BILITOT, BILIDIR, IBILI in the last 72 hours. No results for input(s): CHOL in the last 72 hours. No results for input(s): PROTIME in the last 72 hours.  Imaging: Imaging results have been reviewed  Cardiac Studies:  Assessment/Plan:  Atypical chest pain/minimally elevated troponin I rule out coronary insufficiency doubt significant MI Resolving decompensated congestive heart failure  secondary to systolic/ diastolic dysfunction Minimally elevated troponin I secondary to above Uncontrolled hypertension End-stage renal disease on hemodialysis History of prostate cancer history of kidney stones History of tobacco abuse Anemia of chronic disease. Plan Continue present management. Scheduled for hemodialysis today. Will discuss with  patient regarding left cardiac catheterization in view of markedly depressed LV systolic function and heart failure versus medical management.  LOS: 1 day    Charolette Forward 07/26/2017, 5:26 PM

## 2017-07-26 NOTE — Progress Notes (Addendum)
PROGRESS NOTE    Dwayne Reed  BWG:[id number] DOB: 1955/10/01 DOA: 07/25/2017 PCP: Beckie Salts, MD   Brief Narrative: Patient is a 62 year old African-American male with past medical history of ESRD on dialysis on M,W,F COPD, hypertension, prostate cancer who presented to the emergency department with shortness of breath, chest discomfort, on exertion.  Patient was also elevated blood pressure on presentation. Cardiology has evaluated the patient and he  underwent stress test today which did not show reversible ischemia but showed decreased ejection fraction.  He is also undergoing dialysis as per nephrology.  Assessment & Plan:   Principal Problem:   SOB (shortness of breath) Active Problems:   Chronic kidney disease, stage IV (severe) (HCC)   COPD (chronic obstructive pulmonary disease) (HCC)   Elevated prostate specific antigen (PSA)   Essential hypertension   Acute on chronic systolic CHF (congestive heart failure) (HCC)   ESRD on dialysis ([place])  Dyspnea/Chest discomfort: He presented with  exertional dyspnea.  Denies any chest pain now.  Chest pain was atypical on presentation.  Troponin found to be mildly elevated.  EKG on presentation showed normal sinus rhythm with ST-T changes in the anterolateral and  inferior leads. Cardiology consulted after admission.  He underwent stress test today which does not show any reversible ischemia. This morning he was found to be comfortable .Denies any chest pain, Dyspnea has resolved. Aspirin added. Will check lipid panel.  Congestive heart failure: Stress test showed ejection fraction of 35%.  Global hypokinesis. We will check echocardiogram.Possible cath by cardiology.  End-stage renal disease on dialysis: Nephrology following.  Dialysis today.  He has AV fistula on his right forearm. Continue binders, Sensipar.  Hypertension: Found to be hypertensive on presentation.  Continue his home meds.  Antihypertensives resumed.  Carvedilol 25 mg  twice daily.  Added hydralazine.  Also on amlodipine and Cardura.  COPD: Lungs clear on examination.  History of 20+ pack year .  Quit smoking.  History of prostate cancer :On surveillance.  Follow-up with primary urologist as an outpatient.Last PSA was 9.4  Anemia of chronic disease: Associated with chronic kidney disease.  Hemoglobin 8.2.Plan for Aranesp IV on HD.   DVT prophylaxis: Hep [place] Code Status: Full Family Communication: None present at the bedside Disposition Plan: Further plan awaiting by cardio.To home   Consultants: Cardiology, nephrology  Procedures: Stress test   Antimicrobials: None  Subjective: Patient seen and examined the pressure this morning.  Remains comfortable.  Denies any shortness of breath or chest pain. Underwent dialysis today  Objective: Vitals:   07/26/17 0900 07/26/17 1105 07/26/17 1107 07/26/17 1418  BP: (!) 172/82 (!) 171/85 (!) 162/82 (!) 164/87  Pulse:    72  Resp:      Temp:      TempSrc:      SpO2:      Weight:      Height:        Intake/Output Summary (Last 24 hours) at 07/26/2017 1503 Last data filed at 07/26/2017 0536 Gross per 24 hour  Intake 0 ml  Output 0 ml  Net 0 ml   Filed Weights   07/25/17 0323  Weight: 86.2 kg (190 lb)    Examination:  General exam: Appears calm and comfortable ,Not in distress,average built HEENT:PERRL,Oral mucosa moist, Ear/Nose normal on gross exam Respiratory system: Bilateral equal air entry, normal vesicular breath sounds, no wheezes or crackles  Cardiovascular system: S1 & S2 heard, RRR. No JVD, murmurs, rubs, gallops or clicks. No pedal edema. Gastrointestinal  system: Abdomen is nondistended, soft and nontender. No organomegaly or masses felt. Normal bowel sounds heard. Central nervous system: Alert and oriented. No focal neurological deficits. Extremities: No edema, no clubbing ,no cyanosis, distal peripheral pulses palpable.AV fistula on the right forearm Skin: No rashes, lesions or  ulcers,no icterus ,no pallor MSK: Normal muscle bulk,tone ,power Psychiatry: Judgement and insight appear normal. Mood & affect appropriate.     Data Reviewed: I have personally reviewed following labs and imaging studies  CBC: Recent Labs  Lab 07/25/17 0314  WBC 3.7*  NEUTROABS 2.4  HGB 8.2*  HCT 25.1*  MCV 90.3  PLT 696*   Basic Metabolic Panel: Recent Labs  Lab 07/25/17 0314  NA 142  K 4.3  CL 101  CO2 26  GLUCOSE 98  BUN 37*  CREATININE 8.32*  CALCIUM 9.3   GFR: Estimated Creatinine Clearance: 10 mL/min (A) (by C-G formula based on SCr of 8.32 mg/dL (H)). Liver Function Tests: No results for input(s): AST, ALT, ALKPHOS, BILITOT, PROT, ALBUMIN in the last 168 hours. No results for input(s): LIPASE, AMYLASE in the last 168 hours. No results for input(s): AMMONIA in the last 168 hours. Coagulation Profile: No results for input(s): INR, PROTIME in the last 168 hours. Cardiac Enzymes: Recent Labs  Lab 07/25/17 0314 07/25/17 1337  TROPONINI 0.07* 0.06*   BNP (last 3 results) No results for input(s): PROBNP in the last 8760 hours. HbA1C: No results for input(s): HGBA1C in the last 72 hours. CBG: No results for input(s): GLUCAP in the last 168 hours. Lipid Profile: No results for input(s): CHOL, HDL, LDLCALC, TRIG, CHOLHDL, LDLDIRECT in the last 72 hours. Thyroid Function Tests: No results for input(s): TSH, T4TOTAL, FREET4, T3FREE, THYROIDAB in the last 72 hours. Anemia Panel: No results for input(s): VITAMINB12, FOLATE, FERRITIN, TIBC, IRON, RETICCTPCT in the last 72 hours. Sepsis Labs: No results for input(s): PROCALCITON, LATICACIDVEN in the last 168 hours.  No results found for this or any previous visit (from the past 240 hour(s)).       Radiology Studies: Dg Chest 2 View  Result Date: 07/24/2017 CLINICAL DATA:  Subacute onset of shortness of breath. EXAM: CHEST - 2 VIEW COMPARISON:  Chest radiograph performed 07/17/2017 FINDINGS: The lungs are  well-aerated. Vascular congestion is noted. Mild bibasilar airspace opacities may reflect interstitial edema. Peribronchial thickening is seen. There is no evidence of pleural effusion or pneumothorax. The heart is borderline normal in size. No acute osseous abnormalities are seen. IMPRESSION: Vascular congestion. Mild bibasilar airspace opacities may reflect interstitial edema. Peribronchial thickening noted. Electronically Signed   By: Garald Balding M.D.   On: 07/24/2017 22:40   Nm Myocar Multi W/spect W/wall Motion / Ef  Result Date: 07/26/2017 CLINICAL DATA:  Hypertension. Short of breath. CHF. COPD. End-stage renal disease. EXAM: MYOCARDIAL IMAGING WITH SPECT (REST AND PHARMACOLOGIC-STRESS) GATED LEFT VENTRICULAR WALL MOTION STUDY LEFT VENTRICULAR EJECTION FRACTION TECHNIQUE: Standard myocardial SPECT imaging was performed after resting intravenous injection of 10 mCi Tc-52m tetrofosmin. Subsequently, intravenous infusion of Lexiscan was performed under the supervision of the Cardiology staff. At peak effect of the drug, 30 mCi Tc-39m tetrofosmin was injected intravenously and standard myocardial SPECT imaging was performed. Quantitative gated imaging was also performed to evaluate left ventricular wall motion, and estimate left ventricular ejection fraction. COMPARISON:  Chest radiograph 07/24/2017 FINDINGS: Perfusion: A medium size, mild severity apical inferior wall rest defect is identified. No areas of reversibility to suggest inducible ischemia. Wall Motion: Global hypokinesis. Left Ventricular Ejection Fraction:  35 % End diastolic volume 097 ml End systolic volume 353 ml IMPRESSION: 1. No reversible ischemia. Apical inferior wall mild rest defect could represent infarct or diaphragmatic attenuation. 2. Global hypokinesis. 3. Left ventricular ejection fraction 35% 4. Non invasive risk stratification*: Intermediate-based on ejection fraction of 35% *2012 Appropriate Use Criteria for Coronary  Revascularization Focused Update: J Am Coll Cardiol. 2992;42(6):834-196. http://content.airportbarriers.com.aspx?articleid=[email or web address] Electronically Signed   By: Abigail Miyamoto M.D.   On: 07/26/2017 14:34        Scheduled Meds: . amLODipine  5 mg Oral Daily  . aspirin EC  81 mg Oral Daily  . carvedilol  25 mg Oral BID WC  . cinacalcet  30 mg Oral Once per day on Tue Thu Sat  . darbepoetin (ARANESP) injection - DIALYSIS  60 mcg Intravenous Q Wed-HD  . doxazosin  2 mg Oral Daily  . doxercalciferol  3.5 mcg Intravenous Q M,W,F-HD  . ferric citrate  630 mg Oral TID WC  . hydrALAZINE  50 mg Oral Q8H  . mometasone-formoterol  2 puff Inhalation BID  . nitroGLYCERIN  1 inch Topical Q6H  . zinc sulfate  220 mg Oral Daily   Continuous Infusions:   LOS: 1 day    Time spent: More than 50% of that time was spent in counseling and/or coordination of care.      Shelly Coss, MD [facility] Pager [phone]  If 7PM-7AM, please contact night-coverage [email or web address] Password [facility] 07/26/2017, 3:03 PM

## 2017-07-26 NOTE — Progress Notes (Signed)
  Echocardiogram 2D Echocardiogram has been performed.  Dwayne Reed 07/26/2017, 3:09 PM

## 2017-07-26 NOTE — Consult Note (Addendum)
[facility] Renal Consultation Note    Indication for Consultation:  Management of ESRD/hemodialysis; anemia, hypertension/volume and secondary hyperparathyroidism PCP: Dr. Beckie Salts  HPI: Dwayne Reed is a 62 y.o. male with ESRD on hemodialysis since 2016 at [facility], [place]. PMH significant for HTN, COPD, prostate cancer under surveillance followed by Urology at [facility]. He has recently moved to [place], [place] and is planning on starting HD at [facility] next week.    Patient presented to ED with C/O SOB, DOE, orthopnea. He states this has been ongoing for 2 months and he wishes to find out what is going on. He says he usually feels good post HD after volume has been removed but he awoke extremely SOB this past Tuesday and decided to come for evaluation. He was seen in ED at [facility] for same complaint but left stating he felt his SOB was D/T to pulmonary edema prior to HD. Labs done in ED basically unremarkable. CXR done 07/24/17 shows vascular congestion with mild bibasilar opacities which may reflect interstitial edema. He has been admitted for SOB. Cardiology has been consulted.   Patient seen in room, just returned from VQ scan. Seems comfortable without C/O SOB at present. Asked if his EDW has been challenged in an attempt to alleviate SOB and he does not know. Primary nephrologist is Dr. Willene Hatchet. He says DOE is usually relieved with rest.  He denies fever, chills, does have some nausea associated with binder use, denies chest pain, does have SOB particularly at night, orthopnea, DOE. Denies abdominal pain, flank pain, dysuria, hematuria. (Pt still urinates). He has hemodialysis MWF at [facility], last HD 07/24/2017.   Past Medical History:  Diagnosis Date  . CHF (congestive heart failure) ([place])   . Hypertension   . Renal disorder    ESRD on HD (M,W,F)   History reviewed. No pertinent surgical history. History reviewed. No  pertinent family history. Social History:  reports that he has quit smoking. He has never used smokeless tobacco. He reports that he drinks alcohol. He reports that he does not use drugs. No Known Allergies Prior to Admission medications   Medication Sig Start Date End Date Taking? Authorizing Provider  albuterol (PROVENTIL HFA;VENTOLIN HFA) 108 (90 Base) MCG/ACT inhaler Inhale 2 puffs into the lungs every 6 (six) hours as needed for wheezing.  05/20/17  Yes [provider]  amLODipine (NORVASC) 5 MG tablet Take 1 tablet by mouth daily. 06/21/17  Yes [provider]  AURYXIA 1 GM 210 MG(Fe) tablet Take 1 tablet by mouth 3 (three) times daily. 06/26/17  Yes [provider]  carvedilol (COREG) 25 MG tablet Take 1 tablet by mouth 2 (two) times daily. 06/21/17  Yes [provider]  cinacalcet (SENSIPAR) 30 MG tablet Take 1 tablet by mouth as directed. Tuesday, Thursday and saturday 05/25/15  Yes [provider]  doxazosin (CARDURA) 4 MG tablet Take 2 mg by mouth daily.   Yes [provider]  Fluticasone-Salmeterol (ADVAIR) 100-50 MCG/DOSE AEPB Inhale 1 puff into the lungs 2 (two) times daily. 05/20/17  Yes [provider]  zinc gluconate 50 MG tablet Take 50 mg by mouth daily.   Yes [provider]   Current Facility-Administered Medications  Medication Dose Route Frequency Provider Last Rate Last Dose  . amLODipine (NORVASC) tablet 5 mg  5 mg Oral Daily Nita Sells, MD   5 mg at 07/25/17 1711  . aspirin EC tablet 81 mg  81 mg  Oral Daily Charolette Forward, MD   81 mg at 07/25/17 1711  . carvedilol (COREG) tablet 12.5 mg  12.5 mg Oral BID WC Nita Sells, MD   12.5 mg at 07/25/17 1902  . cinacalcet (SENSIPAR) tablet 30 mg  30 mg Oral Once per day on Tue Thu Sat Nita Sells, MD   30 mg at 07/25/17 1711  . Darbepoetin Alfa (ARANESP) injection 60 mcg  60 mcg Intravenous Q Wed-HD Valentina Gu, NP      .  doxazosin (CARDURA) tablet 2 mg  2 mg Oral Daily Nita Sells, MD   2 mg at 07/25/17 1711  . doxercalciferol (HECTOROL) injection 3.5 mcg  3.5 mcg Intravenous Q M,W,F-HD Valentina Gu, NP      . ferric citrate (AURYXIA) tablet 630 mg  630 mg Oral TID WC Valentina Gu, NP      . mometasone-formoterol [facility]) 100-5 MCG/ACT inhaler 2 puff  2 puff Inhalation BID Nita Sells, MD   2 puff at 07/25/17 2127  . nitroGLYCERIN (NITROGLYN) 2 % ointment 1 inch  1 inch Topical Q6H Charolette Forward, MD   1 inch at 07/25/17 2150  . zinc sulfate capsule 220 mg  220 mg Oral Daily Nita Sells, MD   220 mg at 07/25/17 1711   Labs: Basic Metabolic Panel: Recent Labs  Lab 07/25/17 0314  NA 142  K 4.3  CL 101  CO2 26  GLUCOSE 98  BUN 37*  CREATININE 8.32*  CALCIUM 9.3   Liver Function Tests: No results for input(s): AST, ALT, ALKPHOS, BILITOT, PROT, ALBUMIN in the last 168 hours. No results for input(s): LIPASE, AMYLASE in the last 168 hours. No results for input(s): AMMONIA in the last 168 hours. CBC: Recent Labs  Lab 07/25/17 0314  WBC 3.7*  NEUTROABS 2.4  HGB 8.2*  HCT 25.1*  MCV 90.3  PLT 133*   Cardiac Enzymes: Recent Labs  Lab 07/25/17 0314 07/25/17 1337  TROPONINI 0.07* 0.06*   CBG: No results for input(s): GLUCAP in the last 168 hours. Iron Studies: No results for input(s): IRON, TIBC, TRANSFERRIN, FERRITIN in the last 72 hours. Studies/Results: Dg Chest 2 View  Result Date: 07/24/2017 CLINICAL DATA:  Subacute onset of shortness of breath. EXAM: CHEST - 2 VIEW COMPARISON:  Chest radiograph performed 07/17/2017 FINDINGS: The lungs are well-aerated. Vascular congestion is noted. Mild bibasilar airspace opacities may reflect interstitial edema. Peribronchial thickening is seen. There is no evidence of pleural effusion or pneumothorax. The heart is borderline normal in size. No acute osseous abnormalities are seen. IMPRESSION: Vascular congestion.  Mild bibasilar airspace opacities may reflect interstitial edema. Peribronchial thickening noted. Electronically Signed   By: Garald Balding M.D.   On: 07/24/2017 22:40    ROS: As per HPI otherwise negative.  Physical Exam: Vitals:   07/26/17 0536 07/26/17 0900 07/26/17 1105 07/26/17 1107  BP: (!) 154/78 (!) 172/82 (!) 171/85 (!) 162/82  Pulse: 76     Resp: 15     Temp: 98.2 F (36.8 C)     TempSrc: Oral     SpO2: 92%     Weight:      Height:         General: Well developed, well nourished, in no acute distress. Head: Normocephalic, atraumatic, sclera non-icteric, mucus membranes are moist Neck: Supple. JVD not elevated. Lungs: Bilateral breath sounds, slightly decreased in bases with few bibasilar crackles.  Heart: RRR with S1 S2 + S4. No murmurs, rubs, or gallops appreciated. Abdomen:  Soft, non-tender, non-distended with normoactive bowel sounds. No rebound/guarding. No obvious abdominal masses. M-S:  Strength and tone appear normal for age. Lower extremities:without edema or ischemic changes, no open wounds  Neuro: Alert and oriented X 3. Moves all extremities spontaneously. Psych:  Responds to questions appropriately with a normal affect. Dialysis Access: RFA AVF + bruit  Dialysis Orders: [facility], MWF 3.5 hrs 186 lbs (84.5 kg) 400/800 2.0 K/2.5 Ca  -Heparin 3500 units IV bolus 500 units/hr-total heparin dose 4750 units per treatment -Hectorol 3.5 mcg IV TIW  Assessment/Plan: 1.  SOB/DOE: Per primary. CXR shows pulmonary edema.  Will attempt to lower EDW in HD today. Concern for anginal equivalent-very mildly elevated troponin-flat trend. Cardiology consulted.  2. Uncontrolled HTN-on 4 agents. Will increase carvedilol to 25 mg PO BID, Add hydralazine 50 mg PO TID, Amlodipine 5 mg PO (ordered) and Cardura 2 mg PO q HS per OP med list  3.  ESRD -  MWF via RFA AVF. Check labs with HD. Last K 4.3 2.0 K bath. Usual heparin  4.  Hypertension/volume  - Does not  appear overtly volume overloaded. Agrees to allow challenge of EDW in HD today. Attempt 2.5-3.5 liters in HD today as tolerated.  5.  Anemia  - HGB 8.2. No ESA noted on OP orders. Give Aranesp 60 mcg IV today in HD.  6.  Metabolic bone disease -  Continue binders, sensipar and VDRA 7.  Nutrition - Renal diet with fld restrictions. Nepro, renal vit.   Rita H. Owens Shark, NP-C 07/26/2017, 1:37 PM  [employer] [phone]  Pt seen, examined, agree w assess/plan as above with additions as indicated. ESRD patient from [facility] w/ SOB, orthopnea. On exam has rales and CXR showing pulm edema. Recommend max UF on HD tonight and probably needs lowering of his dry wt.  Will follow.  Kelly Splinter MD [employer] pager [phone]    cell [phone] 07/26/2017, 2:57 PM

## 2017-07-27 DIAGNOSIS — R0602 Shortness of breath: Secondary | ICD-10-CM | POA: Diagnosis not present

## 2017-07-27 LAB — CBC WITH DIFFERENTIAL/PLATELET
Abs Immature Granulocytes: 0 10*3/uL (ref 0.0–0.1)
BASOS ABS: 0 10*3/uL (ref 0.0–0.1)
BASOS PCT: 0 %
EOS ABS: 0.1 10*3/uL (ref 0.0–0.7)
Eosinophils Relative: 3 %
HCT: 22.9 % — ABNORMAL LOW (ref 39.0–52.0)
HEMOGLOBIN: 7.5 g/dL — AB (ref 13.0–17.0)
Immature Granulocytes: 0 %
LYMPHS PCT: 19 %
Lymphs Abs: 0.7 10*3/uL (ref 0.7–4.0)
MCH: 29.1 pg (ref 26.0–34.0)
MCHC: 32.8 g/dL (ref 30.0–36.0)
MCV: 88.8 fL (ref 78.0–100.0)
MONO ABS: 0.4 10*3/uL (ref 0.1–1.0)
Monocytes Relative: 12 %
Neutro Abs: 2.2 10*3/uL (ref 1.7–7.7)
Neutrophils Relative %: 66 %
Platelets: 128 10*3/uL — ABNORMAL LOW (ref 150–400)
RBC: 2.58 MIL/uL — ABNORMAL LOW (ref 4.22–5.81)
RDW: 14.6 % (ref 11.5–15.5)
WBC: 3.4 10*3/uL — ABNORMAL LOW (ref 4.0–10.5)

## 2017-07-27 LAB — BASIC METABOLIC PANEL
Anion gap: 11 (ref 5–15)
BUN: 25 mg/dL — AB (ref 6–20)
CALCIUM: 8.7 mg/dL — AB (ref 8.9–10.3)
CO2: 30 mmol/L (ref 22–32)
CREATININE: 7.12 mg/dL — AB (ref 0.61–1.24)
Chloride: 96 mmol/L — ABNORMAL LOW (ref 101–111)
GFR calc Af Amer: 8 mL/min — ABNORMAL LOW (ref >60)
GFR, EST NON AFRICAN AMERICAN: 7 mL/min — AB (ref >60)
GLUCOSE: 161 mg/dL — AB (ref 65–99)
Potassium: 3.4 mmol/L — ABNORMAL LOW (ref 3.5–5.1)
SODIUM: 137 mmol/L (ref 135–145)

## 2017-07-27 LAB — LIPID PANEL
Cholesterol: 152 mg/dL (ref 0–200)
HDL: 43 mg/dL (ref >40)
LDL CALC: 91 mg/dL (ref 0–99)
TRIGLYCERIDES: 88 mg/dL (ref <150)
Total CHOL/HDL Ratio: 3.5 RATIO
VLDL: 18 mg/dL (ref 0–40)

## 2017-07-27 LAB — PREPARE RBC (CROSSMATCH)

## 2017-07-27 LAB — ABO/RH: ABO/RH(D): O POS

## 2017-07-27 MED ORDER — HEPARIN SODIUM (PORCINE) 5000 UNIT/ML IJ SOLN
5000.0000 [IU] | Freq: Three times a day (TID) | INTRAMUSCULAR | Status: DC
Start: 2017-07-27 — End: 2017-07-28
  Administered 2017-07-27 – 2017-07-28 (×2): 5000 [IU] via SUBCUTANEOUS
  Filled 2017-07-27 (×3): qty 1

## 2017-07-27 MED ORDER — LOSARTAN POTASSIUM 25 MG PO TABS
25.0000 mg | ORAL_TABLET | Freq: Every day | ORAL | Status: DC
  Administered 2017-07-27 – 2017-07-28 (×2): 25 mg via ORAL
  Filled 2017-07-27 (×2): qty 1

## 2017-07-27 MED ORDER — SODIUM CHLORIDE 0.9 % IV SOLN: Freq: Once | INTRAVENOUS | Status: DC

## 2017-07-27 MED ORDER — POTASSIUM CHLORIDE CRYS ER 20 MEQ PO TBCR
20.0000 meq | EXTENDED_RELEASE_TABLET | Freq: Once | ORAL | Status: AC
  Administered 2017-07-27: 20 meq via ORAL
  Filled 2017-07-27: qty 1

## 2017-07-27 NOTE — Progress Notes (Signed)
HD tx completed @ 2355 w/o problem, UF goal met,blood rinsed back, VSS, report called to Percell Boston, RN

## 2017-07-27 NOTE — Care Management Note (Addendum)
Case Management Note  Patient Details  Name: Dwayne Reed MRN: [id number] Date of Birth: [birth year]  Subjective/Objective:   History of ESRD on HD, prostate cancer, kidney stones, tobacco abuse, Anemia of chronic disease; Admitted for Atypical chest pain/minimally elevated troponin I              Action/Plan: 5/16-cardiology consulted: Discussed with patient regarding invasive left cardiac catheterization possible PCI in view of depressed LV systolic function again and wanted to be treated medically. Plan: Consider packed RBC transfusion during hemodialysis in a.m.;We will add low-dose ARB Monitor renal function in a.m.  NCM will continue to follow for discharge planning needs.  Expected Discharge Date:  (UNKNOWN)               Expected Discharge Plan:   Home self-care In-House Referral:   N/A Discharge planning Services   CM consult  Status of Service:   In progress will continue to monitor  Kristen Cardinal, RN 07/27/2017, 11:04 AM

## 2017-07-27 NOTE — Progress Notes (Signed)
[facility] Progress Note  Subjective: SOB "a little" better, no new c/o's.  Took off 3.5 L on HD last night w/ no BP drops, BP's high the whole Rx.  No cough or CP.  ECHO showed new low EF 30%.    Vitals:   07/27/17 0108 07/27/17 0504 07/27/17 0831 07/27/17 0837  BP: (!) 174/89 121/67 (!) 144/80   Pulse: 84 72 75 76  Resp: 16 16  16   Temp: 98.1 F (36.7 C) 98.3 F (36.8 C) 98.3 F (36.8 C)   TempSrc: Oral Oral Oral   SpO2: 93% 93% 94% 96%  Weight:      Height:        Inpatient medications: . amLODipine  5 mg Oral Daily  . aspirin EC  81 mg Oral Daily  . carvedilol  25 mg Oral BID WC  . cinacalcet  30 mg Oral Once per day on Tue Thu Sat  . darbepoetin (ARANESP) injection - DIALYSIS  60 mcg Intravenous Q Wed-HD  . doxazosin  2 mg Oral Daily  . doxercalciferol  3.5 mcg Intravenous Q M,W,F-HD  . ferric citrate  630 mg Oral TID WC  . heparin injection (subcutaneous)  5,000 Units Subcutaneous Q8H  . hydrALAZINE  50 mg Oral Q8H  . losartan  25 mg Oral Daily  . mometasone-formoterol  2 puff Inhalation BID  . nitroGLYCERIN  1 inch Topical Q6H  . zinc sulfate  220 mg Oral Daily   . sodium chloride    . sodium chloride     sodium chloride, sodium chloride, alteplase, heparin, lidocaine (PF), lidocaine-prilocaine, pentafluoroprop-tetrafluoroeth  Exam: Gen alert wdwn no distress at 45deg in bed No rash, cyanosis or gangrene Sclera anicteric, throat clear  No jvd or bruits Chest bibasilar fine rales R> L and better than yest RRR no MRG Abd soft ntnd no mass or ascites +bs GU normal male Ext no LE edema Neuro is alert, Ox 3 , nf RFA AVF+bruit    Dialysis: [facility] MWF 3.5h  84.5kg (186lbs)  2/2.5 bath  Heparin 3500 then 500 /hr (total 4750)  RFA AVF -hectorol 3.5 ug tiw     Impression: 1  SOB/ orthopnea - pulm edema / vol overload prob due to lean body wt loss. Also new lowering of EF% by echo. Getting volume down with dialysis.  Cardiac  issues per cards consult.  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 6  Anemia ckd - Hb is low, gave darbe 60 ug yest and will get 2u prbc's on HD tomorrow 7  Wt loss/ anemia - not sure cause of this would see if he has PCP to work this up as outpt 8  Dispo - from renal / fluid standpoint should be OK for dc tomorrow after HD   Plan - HD Friday am 1st shift   Kelly Splinter MD [place] pager [phone]   07/27/2017, 12:55 PM   Recent Labs  Lab 07/25/17 0314 07/26/17 2118 07/27/17 0350  NA 142 138 137  K 4.3 4.1 3.4*  CL 101 100* 96*  CO2 26 25 30   GLUCOSE 98 114* 161*  BUN 37* 64* 25*  CREATININE 8.32* 12.73* 7.12*  CALCIUM 9.3 8.5* 8.7*  PHOS  --  5.6*  --    Recent Labs  Lab 07/26/17 2118  ALBUMIN 2.8*   Recent Labs  Lab 07/25/17 0314 07/26/17 2119 07/27/17 0811  WBC 3.7* 3.8* 3.4*  NEUTROABS 2.4  --  2.2  HGB 8.2* 7.0* 7.5*  HCT 25.1* 21.2* 22.9*  MCV 90.3 88.3 88.8  PLT 133* 125* 128*   Iron/TIBC/Ferritin/ %Sat No results found for: IRON, TIBC, FERRITIN, IRONPCTSAT

## 2017-07-27 NOTE — Progress Notes (Signed)
PROGRESS NOTE    Dwayne Reed  VQM:[id number] DOB: 1956-01-23 DOA: 07/25/2017 PCP: Beckie Salts, MD   Brief Narrative: Patient is a 62 year old African-American male with past medical history of ESRD on dialysis on M,W,F COPD, hypertension, prostate cancer who presented to the emergency department with shortness of breath, chest discomfort, on exertion.  Patient was also elevated blood pressure on presentation. Cardiology has evaluated the patient and he  underwent stress test today which did not show reversible ischemia but showed decreased ejection fraction.  He is also undergoing dialysis as per nephrology. Plan is to continue medical management.  Most likely will be dialyzed tomorrow and the plan is for discharge to home.  Assessment & Plan:   Principal Problem:   SOB (shortness of breath) Active Problems:   Chronic kidney disease, stage IV (severe) (HCC)   COPD (chronic obstructive pulmonary disease) (HCC)   Elevated prostate specific antigen (PSA)   Essential hypertension   Acute on chronic systolic CHF (congestive heart failure) (HCC)   ESRD on dialysis ([place])  Dyspnea/Chest discomfort: He presented with  exertional dyspnea.  Denies any chest pain now.  Chest pain was atypical on presentation.  Troponin found to be mildly elevated.  EKG on presentation showed normal sinus rhythm with ST-T changes in the anterolateral and  inferior leads. Cardiology consulted after admission.  He underwent stress test today which does not show any reversible ischemia. This morning he was found to be comfortable .Denies any chest pain, Dyspnea has resolved. Aspirin added.   Congestive heart failure: Echo showed ejection fraction of 35%-40%  diffuse hypokinesis.  Cardiac cath discussed by cardiology but patient chose to continue medical treatment.  Started on losartan.  He will follow-up with cardiology as an outpatient.  End-stage renal disease on dialysis: Nephrology following.  Dialysis tomorrow.   He has AV fistula on his right forearm. Continue binders, Sensipar.   Hypertension: Found to be hypertensive on presentation.  Continue his home meds.  Antihypertensives resumed.  Carvedilol 25 mg twice daily.  Added hydralazine.  Also on amlodipine and Cardura.  COPD: Lungs clear on examination.  History of 20+ pack year .  Quit smoking.  History of prostate cancer :On surveillance.  Follow-up with primary urologist as an outpatient.Last PSA was 9.4  Anemia of chronic disease: Associated with chronic kidney disease.  Hemoglobin 7.4 today.  He will be transfused tomorrow during dialysis.   DVT prophylaxis: Hep [place] Code Status: Full Family Communication: None present at the bedside Disposition Plan: Further plan awaiting by cardio.To home   Consultants: Cardiology, nephrology  Procedures: Stress test ,Echo  Antimicrobials: None   Subjective: Patient seen and examined the pressure this morning.  Remains comfortable.  Denies any shortness of breath or chest pain. No complaints today  Objective: Vitals:   07/27/17 0108 07/27/17 0504 07/27/17 0831 07/27/17 0837  BP: (!) 174/89 121/67 (!) 144/80   Pulse: 84 72 75 76  Resp: 16 16  16   Temp: 98.1 F (36.7 C) 98.3 F (36.8 C) 98.3 F (36.8 C)   TempSrc: Oral Oral Oral   SpO2: 93% 93% 94% 96%  Weight:      Height:        Intake/Output Summary (Last 24 hours) at 07/27/2017 1223 Last data filed at 07/27/2017 1000 Gross per 24 hour  Intake 600 ml  Output 3500 ml  Net -2900 ml   Filed Weights   07/25/17 0323 07/26/17 2015 07/27/17 0022  Weight: 86.2 kg (190 lb) 81.9 kg (180  lb 8.9 oz) 78.4 kg (172 lb 13.5 oz)    Examination:  General exam: Appears calm and comfortable ,Not in distress,average built HEENT:PERRL,Oral mucosa moist, Ear/Nose normal on gross exam Respiratory system: Bilateral equal air entry, normal vesicular breath sounds, no wheezes or crackles  Cardiovascular system: S1 & S2 heard, RRR. No JVD, murmurs,  rubs, gallops or clicks. Gastrointestinal system: Abdomen is nondistended, soft and nontender. No organomegaly or masses felt. Normal bowel sounds heard. Central nervous system: Alert and oriented. No focal neurological deficits. Extremities: No edema, no clubbing ,no cyanosis, distal peripheral pulses palpable. Skin: No rashes, lesions or ulcers,no icterus ,no pallor Psychiatry: Judgement and insight appear normal. Mood & affect appropriate.      Data Reviewed: I have personally reviewed following labs and imaging studies  CBC: Recent Labs  Lab 07/25/17 0314 07/26/17 2119 07/27/17 0811  WBC 3.7* 3.8* 3.4*  NEUTROABS 2.4  --  2.2  HGB 8.2* 7.0* 7.5*  HCT 25.1* 21.2* 22.9*  MCV 90.3 88.3 88.8  PLT 133* 125* 314*   Basic Metabolic Panel: Recent Labs  Lab 07/25/17 0314 07/26/17 2118 07/27/17 0350  NA 142 138 137  K 4.3 4.1 3.4*  CL 101 100* 96*  CO2 26 25 30   GLUCOSE 98 114* 161*  BUN 37* 64* 25*  CREATININE 8.32* 12.73* 7.12*  CALCIUM 9.3 8.5* 8.7*  PHOS  --  5.6*  --    GFR: Estimated Creatinine Clearance: 11.7 mL/min (A) (by C-G formula based on SCr of 7.12 mg/dL (H)). Liver Function Tests: Recent Labs  Lab 07/26/17 2118  ALBUMIN 2.8*   No results for input(s): LIPASE, AMYLASE in the last 168 hours. No results for input(s): AMMONIA in the last 168 hours. Coagulation Profile: No results for input(s): INR, PROTIME in the last 168 hours. Cardiac Enzymes: Recent Labs  Lab 07/25/17 0314 07/25/17 1337  TROPONINI 0.07* 0.06*   BNP (last 3 results) No results for input(s): PROBNP in the last 8760 hours. HbA1C: No results for input(s): HGBA1C in the last 72 hours. CBG: No results for input(s): GLUCAP in the last 168 hours. Lipid Profile: Recent Labs    07/27/17 0350  CHOL 152  HDL 43  LDLCALC 91  TRIG 88  CHOLHDL 3.5   Thyroid Function Tests: No results for input(s): TSH, T4TOTAL, FREET4, T3FREE, THYROIDAB in the last 72 hours. Anemia Panel: No  results for input(s): VITAMINB12, FOLATE, FERRITIN, TIBC, IRON, RETICCTPCT in the last 72 hours. Sepsis Labs: No results for input(s): PROCALCITON, LATICACIDVEN in the last 168 hours.  No results found for this or any previous visit (from the past 240 hour(s)).       Radiology Studies: Nm Myocar Multi W/spect W/wall Motion / Ef  Result Date: 07/26/2017 CLINICAL DATA:  Hypertension. Short of breath. CHF. COPD. End-stage renal disease. EXAM: MYOCARDIAL IMAGING WITH SPECT (REST AND PHARMACOLOGIC-STRESS) GATED LEFT VENTRICULAR WALL MOTION STUDY LEFT VENTRICULAR EJECTION FRACTION TECHNIQUE: Standard myocardial SPECT imaging was performed after resting intravenous injection of 10 mCi Tc-9m tetrofosmin. Subsequently, intravenous infusion of Lexiscan was performed under the supervision of the Cardiology staff. At peak effect of the drug, 30 mCi Tc-58m tetrofosmin was injected intravenously and standard myocardial SPECT imaging was performed. Quantitative gated imaging was also performed to evaluate left ventricular wall motion, and estimate left ventricular ejection fraction. COMPARISON:  Chest radiograph 07/24/2017 FINDINGS: Perfusion: A medium size, mild severity apical inferior wall rest defect is identified. No areas of reversibility to suggest inducible ischemia. Wall Motion: Global hypokinesis.  Left Ventricular Ejection Fraction: 35 % End diastolic volume 117 ml End systolic volume 356 ml IMPRESSION: 1. No reversible ischemia. Apical inferior wall mild rest defect could represent infarct or diaphragmatic attenuation. 2. Global hypokinesis. 3. Left ventricular ejection fraction 35% 4. Non invasive risk stratification*: Intermediate-based on ejection fraction of 35% *2012 Appropriate Use Criteria for Coronary Revascularization Focused Update: J Am Coll Cardiol. 7014;10(3):013-143. http://content.[email or web address] Electronically Signed   By: Abigail Miyamoto M.D.   On: 07/26/2017  14:34        Scheduled Meds: . amLODipine  5 mg Oral Daily  . aspirin EC  81 mg Oral Daily  . carvedilol  25 mg Oral BID WC  . cinacalcet  30 mg Oral Once per day on Tue Thu Sat  . darbepoetin (ARANESP) injection - DIALYSIS  60 mcg Intravenous Q Wed-HD  . doxazosin  2 mg Oral Daily  . doxercalciferol  3.5 mcg Intravenous Q M,W,F-HD  . ferric citrate  630 mg Oral TID WC  . hydrALAZINE  50 mg Oral Q8H  . losartan  25 mg Oral Daily  . mometasone-formoterol  2 puff Inhalation BID  . nitroGLYCERIN  1 inch Topical Q6H  . zinc sulfate  220 mg Oral Daily   Continuous Infusions: . sodium chloride    . sodium chloride       LOS: 2 days    Time spent: 25 mins.More than 50% of that time was spent in counseling and/or coordination of care.      Shelly Coss, MD [facility] Pager [phone]  If 7PM-7AM, please contact night-coverage [email or web address] Password [facility] 07/27/2017, 12:23 PM

## 2017-07-27 NOTE — Progress Notes (Signed)
Subjective:  Patient denies any chest pain or shortness of breath states breathing has improved after hemodialysis last night. Scheduled for dialysis tomorrow. Discussed with patient regarding invasive left cardiac catheterization possible PCI in view of depressed LV systolic function again and wanted to be treated medically.   Objective:  Vital Signs in the last 24 hours: Temp:  [97.7 F (36.5 C)-98.3 F (36.8 C)] 98.3 F (36.8 C) (05/16 0831) Pulse Rate:  [72-84] 76 (05/16 0837) Resp:  [15-20] 16 (05/16 0837) BP: (121-176)/(67-100) 144/80 (05/16 0831) SpO2:  [93 %-100 %] 96 % (05/16 0837) Weight:  [78.4 kg (172 lb 13.5 oz)-81.9 kg (180 lb 8.9 oz)] 78.4 kg (172 lb 13.5 oz) (05/16 0022)  Intake/Output from previous day: 05/15 0701 - 05/16 0700 In: 240 [P.O.:240] Out: 3500  Intake/Output from this shift: Total I/O In: 120 [P.O.:120] Out: -   Physical Exam: Neck: no adenopathy, no carotid bruit, no JVD and supple, symmetrical, trachea midline Lungs: Decreased breath sound at bases with faint rales air entry improved Heart: regular rate and rhythm, S1, S2 normal and Soft systolic murmur noted Abdomen: soft, non-tender; bowel sounds normal; no masses,  no organomegaly Extremities: extremities normal, atraumatic, no cyanosis or edema  Lab Results: Recent Labs    07/26/17 2119 07/27/17 0811  WBC 3.8* 3.4*  HGB 7.0* 7.5*  PLT 125* 128*   Recent Labs    07/26/17 2118 07/27/17 0350  NA 138 137  K 4.1 3.4*  CL 100* 96*  CO2 25 30  GLUCOSE 114* 161*  BUN 64* 25*  CREATININE 12.73* 7.12*   Recent Labs    07/25/17 0314 07/25/17 1337  TROPONINI 0.07* 0.06*   Hepatic Function Panel Recent Labs    07/26/17 2118  ALBUMIN 2.8*   Recent Labs    07/27/17 0350  CHOL 152   No results for input(s): PROTIME in the last 72 hours.  Imaging: Imaging results have been reviewed and Nm Myocar Multi W/spect W/wall Motion / Ef  Result Date: 07/26/2017 CLINICAL DATA:   Hypertension. Short of breath. CHF. COPD. End-stage renal disease. EXAM: MYOCARDIAL IMAGING WITH SPECT (REST AND PHARMACOLOGIC-STRESS) GATED LEFT VENTRICULAR WALL MOTION STUDY LEFT VENTRICULAR EJECTION FRACTION TECHNIQUE: Standard myocardial SPECT imaging was performed after resting intravenous injection of 10 mCi Tc-37m tetrofosmin. Subsequently, intravenous infusion of Lexiscan was performed under the supervision of the Cardiology staff. At peak effect of the drug, 30 mCi Tc-22m tetrofosmin was injected intravenously and standard myocardial SPECT imaging was performed. Quantitative gated imaging was also performed to evaluate left ventricular wall motion, and estimate left ventricular ejection fraction. COMPARISON:  Chest radiograph 07/24/2017 FINDINGS: Perfusion: A medium size, mild severity apical inferior wall rest defect is identified. No areas of reversibility to suggest inducible ischemia. Wall Motion: Global hypokinesis. Left Ventricular Ejection Fraction: 35 % End diastolic volume 209 ml End systolic volume 470 ml IMPRESSION: 1. No reversible ischemia. Apical inferior wall mild rest defect could represent infarct or diaphragmatic attenuation. 2. Global hypokinesis. 3. Left ventricular ejection fraction 35% 4. Non invasive risk stratification*: Intermediate-based on ejection fraction of 35% *2012 Appropriate Use Criteria for Coronary Revascularization Focused Update: J Am Coll Cardiol. 9628;36(6):294-765. http://content.[email or web address]?[email or web address] Electronically Signed   By: Abigail Miyamoto M.D.   On: 07/26/2017 14:34    Cardiac Studies:  Assessment/Plan:  Atypical chest pain/minimally elevated troponin I rule out coronary insufficiency doubt significant MI Resolving decompensated congestive heart failure  secondary to systolic/ diastolic dysfunction Minimally elevated troponin I secondary to above Uncontrolled hypertension End-stage  renal disease on hemodialysis History of  prostate cancer history of kidney stones History of tobacco abuse Anemia of chronic disease. Plan Consider packed RBC transfusion during hemodialysis in a.m. We will add low-dose ARB Monitor renal function in a.m.  LOS: 2 days    Dwayne Reed 07/27/2017, 10:25 AM

## 2017-07-28 DIAGNOSIS — R0602 Shortness of breath: Secondary | ICD-10-CM | POA: Diagnosis not present

## 2017-07-28 LAB — CBC
HEMATOCRIT: 22.6 % — AB (ref 39.0–52.0)
Hemoglobin: 7.4 g/dL — ABNORMAL LOW (ref 13.0–17.0)
MCH: 29.4 pg (ref 26.0–34.0)
MCHC: 32.7 g/dL (ref 30.0–36.0)
MCV: 89.7 fL (ref 78.0–100.0)
Platelets: 128 10*3/uL — ABNORMAL LOW (ref 150–400)
RBC: 2.52 MIL/uL — ABNORMAL LOW (ref 4.22–5.81)
RDW: 14.6 % (ref 11.5–15.5)
WBC: 3.6 10*3/uL — ABNORMAL LOW (ref 4.0–10.5)

## 2017-07-28 LAB — LIPID PANEL
Cholesterol: 144 mg/dL (ref 0–200)
HDL: 35 mg/dL — AB (ref >40)
LDL CALC: 88 mg/dL (ref 0–99)
TRIGLYCERIDES: 104 mg/dL (ref <150)
Total CHOL/HDL Ratio: 4.1 RATIO
VLDL: 21 mg/dL (ref 0–40)

## 2017-07-28 LAB — HEPATITIS B SURFACE ANTIGEN: Hepatitis B Surface Ag: NEGATIVE

## 2017-07-28 LAB — RENAL FUNCTION PANEL
ALBUMIN: 2.7 g/dL — AB (ref 3.5–5.0)
Anion gap: 15 (ref 5–15)
BUN: 45 mg/dL — AB (ref 6–20)
CALCIUM: 8.7 mg/dL — AB (ref 8.9–10.3)
CO2: 24 mmol/L (ref 22–32)
CREATININE: 11.31 mg/dL — AB (ref 0.61–1.24)
Chloride: 100 mmol/L — ABNORMAL LOW (ref 101–111)
GFR calc Af Amer: 5 mL/min — ABNORMAL LOW (ref >60)
GFR, EST NON AFRICAN AMERICAN: 4 mL/min — AB (ref >60)
GLUCOSE: 90 mg/dL (ref 65–99)
PHOSPHORUS: 5.3 mg/dL — AB (ref 2.5–4.6)
POTASSIUM: 4.2 mmol/L (ref 3.5–5.1)
SODIUM: 139 mmol/L (ref 135–145)

## 2017-07-28 MED ORDER — LOSARTAN POTASSIUM 25 MG PO TABS: 25.0000 mg | ORAL_TABLET | Freq: Every day | ORAL | 0 refills | Status: DC

## 2017-07-28 MED ORDER — CALCITRIOL 1 MCG/ML IV SOLN
INTRAVENOUS | Status: AC
  Filled 2017-07-28: qty 4

## 2017-07-28 MED ORDER — HYDRALAZINE HCL 50 MG PO TABS: 50.0000 mg | ORAL_TABLET | Freq: Three times a day (TID) | ORAL | 0 refills | Status: DC

## 2017-07-28 MED ORDER — DOXERCALCIFEROL 4 MCG/2ML IV SOLN
INTRAVENOUS | Status: AC
  Administered 2017-07-28: 3.5 ug via INTRAVENOUS
  Filled 2017-07-28: qty 2

## 2017-07-28 MED ORDER — ASPIRIN 81 MG PO TBEC: 81.0000 mg | DELAYED_RELEASE_TABLET | Freq: Every day | ORAL | 0 refills | Status: DC

## 2017-07-28 MED ORDER — VANCOMYCIN HCL IN DEXTROSE 1-5 GM/200ML-% IV SOLN
INTRAVENOUS | Status: AC
  Filled 2017-07-28: qty 200

## 2017-07-28 MED ORDER — HEPARIN SODIUM (PORCINE) 1000 UNIT/ML DIALYSIS: 2400.0000 [IU] | INTRAMUSCULAR | Status: DC | PRN

## 2017-07-28 NOTE — Progress Notes (Signed)
Subjective:  atient denies any chest pain.  States breathing is improved.  Seen during hemodialysis, tolerating well  We will be receiving 2 units of packed RBCs during hemodialysis.  Objective:  Vital Signs in the last 24 hours: Temp:  [97.8 F (36.6 C)-98.5 F (36.9 C)] 97.8 F (36.6 C) (05/17 0945) Pulse Rate:  [69-80] 74 (05/17 1000) Resp:  [9-18] 17 (05/17 0945) BP: (133-157)/(76-89) 133/82 (05/17 1000) SpO2:  [94 %-98 %] 98 % (05/17 0945) Weight:  [78.4 kg (172 lb 13.4 oz)-80.6 kg (177 lb 11.1 oz)] 80.6 kg (177 lb 11.1 oz) (05/17 0756)  Intake/Output from previous day: 05/16 0701 - 05/17 0700 In: 420 [P.O.:420] Out: 0  Intake/Output from this shift: No intake/output data recorded.  Physical Exam: Neck: no adenopathy, no carotid bruit, no JVD and supple, symmetrical, trachea midline Lungs: clear to auscultation bilaterally Heart: regular rate and rhythm, S1, S2 normal and soft systolic murmur noted Abdomen: soft, non-tender; bowel sounds normal; no masses,  no organomegaly Extremities: extremities normal, atraumatic, no cyanosis or edema  Lab Results: Recent Labs    07/27/17 0811 07/28/17 0640  WBC 3.4* 3.6*  HGB 7.5* 7.4*  PLT 128* 128*   Recent Labs    07/27/17 0350 07/28/17 0633  NA 137 139  K 3.4* 4.2  CL 96* 100*  CO2 30 24  GLUCOSE 161* 90  BUN 25* 45*  CREATININE 7.12* 11.31*   Recent Labs    07/25/17 1337  TROPONINI 0.06*   Hepatic Function Panel Recent Labs    07/28/17 0633  ALBUMIN 2.7*   Recent Labs    07/28/17 0633  CHOL 144   No results for input(s): PROTIME in the last 72 hours.  Imaging: Imaging results have been reviewed and Nm Myocar Multi W/spect W/wall Motion / Ef  Result Date: 07/26/2017 CLINICAL DATA:  Hypertension. Short of breath. CHF. COPD. End-stage renal disease. EXAM: MYOCARDIAL IMAGING WITH SPECT (REST AND PHARMACOLOGIC-STRESS) GATED LEFT VENTRICULAR WALL MOTION STUDY LEFT VENTRICULAR EJECTION FRACTION TECHNIQUE:  Standard myocardial SPECT imaging was performed after resting intravenous injection of 10 mCi Tc-35m tetrofosmin. Subsequently, intravenous infusion of Lexiscan was performed under the supervision of the Cardiology staff. At peak effect of the drug, 30 mCi Tc-38m tetrofosmin was injected intravenously and standard myocardial SPECT imaging was performed. Quantitative gated imaging was also performed to evaluate left ventricular wall motion, and estimate left ventricular ejection fraction. COMPARISON:  Chest radiograph 07/24/2017 FINDINGS: Perfusion: A medium size, mild severity apical inferior wall rest defect is identified. No areas of reversibility to suggest inducible ischemia. Wall Motion: Global hypokinesis. Left Ventricular Ejection Fraction: 35 % End diastolic volume 893 ml End systolic volume 734 ml IMPRESSION: 1. No reversible ischemia. Apical inferior wall mild rest defect could represent infarct or diaphragmatic attenuation. 2. Global hypokinesis. 3. Left ventricular ejection fraction 35% 4. Non invasive risk stratification*: Intermediate-based on ejection fraction of 35% *2012 Appropriate Use Criteria for Coronary Revascularization Focused Update: J Am Coll Cardiol. 2876;81(1):572-620. http://content.[email or web address]?[email or web address] Electronically Signed   By: Abigail Miyamoto M.D.   On: 07/26/2017 14:34    Cardiac Studies:  Assessment/Plan:  Atypical chest pain/minimally elevated troponin I rule out coronary insufficiency doubt significant MI Resolving decompensated congestive heart failure secondary to systolic/diastolic dysfunction Minimally elevated troponin I secondary to above Uncontrolled hypertension End-stage renal disease on hemodialysis History of prostate cancer history of kidney stones History of tobacco abuse Anemia of chronic disease. Plan Continue present management. okay to discharge from cardiac point of view. Follow-up with  me in 2 weeks   LOS: 3 days     Charolette Forward 07/28/2017, 10:28 AM

## 2017-07-28 NOTE — Progress Notes (Signed)
Discharge instructions discussed and reviewed with pt and wife at bedside, verbalized understanding. Copy of AVS given. Pt was escorted out of the unit in wheelchair.

## 2017-07-28 NOTE — Progress Notes (Signed)
[facility] Progress Note  Subjective: sob much better  Vitals:   07/28/17 1130 07/28/17 1151 07/28/17 1215 07/28/17 1226  BP: (!) 163/88 (!) 150/82 (!) 143/83   Pulse: 69 66 69   Resp:  11    Temp:  98 F (36.7 C)    TempSrc:  Oral    SpO2:  98%    Weight:    78.1 kg (172 lb 2.9 oz)  Height:        Inpatient medications: . amLODipine  5 mg Oral Daily  . aspirin EC  81 mg Oral Daily  . carvedilol  25 mg Oral BID WC  . cinacalcet  30 mg Oral Once per day on Tue Thu Sat  . darbepoetin (ARANESP) injection - DIALYSIS  60 mcg Intravenous Q Wed-HD  . doxazosin  2 mg Oral Daily  . doxercalciferol  3.5 mcg Intravenous Q M,W,F-HD  . ferric citrate  630 mg Oral TID WC  . heparin injection (subcutaneous)  5,000 Units Subcutaneous Q8H  . hydrALAZINE  50 mg Oral Q8H  . losartan  25 mg Oral Daily  . mometasone-formoterol  2 puff Inhalation BID  . nitroGLYCERIN  1 inch Topical Q6H  . zinc sulfate  220 mg Oral Daily   . sodium chloride       Exam: Gen alert wdwn no distress  No jvd or bruits Chest clear bilat, rales resolved RRR no MRG Abd soft ntnd no mass or ascites +bs GU normal male Ext no LE edema Neuro is alert, Ox 3 , nf RFA AVF+bruit    Dialysis: [place] [facility] MWF 3.5h  84.5kg (186lbs)  2/2.5 bath  Heparin 3500 then 500 /hr (total 4750)  RFA AVF -hectorol 3.5 ug tiw     Impression: 1  SOB/ orthopnea - pulm edema / vol overload due to lean body wt loss in esrd patient.  New dry wt will be about 78kg (was 84kg).  I have notified his HD unit in [place] of this change.    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 6  Anemia ckd - Hb was low got 2u prbc's here 7  Dispo - ok for dc after hd today   Plan - as above   Kelly Splinter MD [facility] pager [phone]   07/28/2017, 1:22 PM   Recent Labs  Lab 07/26/17 2118 07/27/17 0350 07/28/17 0633  NA 138 137 139  K  4.1 3.4* 4.2  CL 100* 96* 100*  CO2 25 30 24   GLUCOSE 114* 161* 90  BUN 64* 25* 45*  CREATININE 12.73* 7.12* 11.31*  CALCIUM 8.5* 8.7* 8.7*  PHOS 5.6*  --  5.3*   Recent Labs  Lab 07/26/17 2118 07/28/17 0633  ALBUMIN 2.8* 2.7*   Recent Labs  Lab 07/25/17 0314 07/26/17 2119 07/27/17 0811 07/28/17 0640  WBC 3.7* 3.8* 3.4* 3.6*  NEUTROABS 2.4  --  2.2  --   HGB 8.2* 7.0* 7.5* 7.4*  HCT 25.1* 21.2* 22.9* 22.6*  MCV 90.3 88.3 88.8 89.7  PLT 133* 125* 128* 128*   Iron/TIBC/Ferritin/ %Sat No results found for: IRON, TIBC, FERRITIN, IRONPCTSAT

## 2017-07-28 NOTE — Discharge Summary (Signed)
Physician Discharge Summary  Dwayne Reed EPP:[id number] DOB: 12-19-1955 DOA: 07/25/2017  PCP: Beckie Salts, MD  Admit date: 07/25/2017 Discharge date: 07/28/2017  Admitted From: Home Disposition:  Home  Discharge Condition:Stable CODE STATUS:FULL Diet recommendation: Heart Healthy  Brief/Interim Summary: Patient is a 62 year old African-American male with past medical history of ESRD on dialysis on M,W,F COPD, hypertension, prostate cancer who presented to the emergency department with shortness of breath, chest discomfort, on exertion.  Patient was also found to have elevated blood pressure on presentation. Chest discomfort,dysnpnea resolved after admission.  Cardiology  evaluated the patient and he  underwent stress test  which did not show reversible ischemia but showed decreased ejection fraction.Echocardiogram showed ejection fraction of 35 to 40%, diffuse hypokinesis, grade 1 diastolic dysfunction.  Different options were discussed with the patient including left heart catheterization and medical management.Patient was reluctant for undergoing cardiac cath so plan is to continue medical management.  He also underwent dialysis as per nephrology. Patient is stable to be discharged to home today.  He will follow-up with cardiology as an outpatient.  Following problems were addressed during his hospitalization:  Dyspnea/Chest discomfort: He presented with  exertional dyspnea.  Denies any chest pain now.  Chest pain was atypical on presentation.  Troponin found to be mildly elevated.  EKG on presentation showed normal sinus rhythm with ST-T changes in the anterolateral and  inferior leads. Cardiology consulted after admission.  He underwent stress test today which does not show any reversible ischemia. This morning he was found to be comfortable .Denies any chest pain, Dyspnea has resolved. Aspirin has been added.   Congestive heart failure: Echo showed ejection fraction of 35%-40%   diffuse hypokinesis.  Cardiac cath discussed by cardiology but patient chose to continue medical treatment.  Started on losartan.  He will follow-up with cardiology as an outpatient.  End-stage renal disease on dialysis: Nephrology following.  Dialysis tomorrow.  He has AV fistula on his right forearm. Continue binders, Sensipar.   Hypertension: Found to be hypertensive on presentation.  Continue his home meds.    Added hydralazine.  Also on coreg, amlodipine and Cardura.  COPD: Lungs clear on examination.  History of 20+ pack year .  Quit smoking.  History of prostate cancer :On surveillance.  Follow-up with primary urologist as an outpatient.Last PSA was 9.4  Anemia of chronic disease: Associated with chronic kidney disease. Hemoglobin 7.4 on 07/27/17 andhe will be transfused today during dialysis.    Discharge Diagnoses:  Principal Problem:   SOB (shortness of breath) Active Problems:   Chronic kidney disease, stage IV (severe) (HCC)   COPD (chronic obstructive pulmonary disease) (HCC)   Elevated prostate specific antigen (PSA)   Essential hypertension   Acute on chronic systolic CHF (congestive heart failure) (HCC)   ESRD on dialysis [facility])    Discharge Instructions  Discharge Instructions    Diet - low sodium heart healthy   Complete by:  As directed    Discharge instructions   Complete by:  As directed    1) Follow up with your PCP in a week. 2 follow-up with cardiology in 2 weeks.  Name and number of the provider has been attached. 3)Take prescribed medications as instructed.   Increase activity slowly   Complete by:  As directed      Allergies as of 07/28/2017   No Known Allergies     Medication List    TAKE these medications   albuterol 108 (90 Base) MCG/ACT inhaler Commonly known as:  PROVENTIL HFA;VENTOLIN HFA Inhale 2 puffs into the lungs every 6 (six) hours as needed for wheezing.   amLODipine 5 MG tablet Commonly known as:  NORVASC Take 1 tablet  by mouth daily.   aspirin 81 MG EC tablet Take 1 tablet (81 mg total) by mouth daily.   AURYXIA 1 GM 210 MG(Fe) tablet Generic drug:  ferric citrate Take 1 tablet by mouth 3 (three) times daily.   carvedilol 25 MG tablet Commonly known as:  COREG Take 1 tablet by mouth 2 (two) times daily.   doxazosin 4 MG tablet Commonly known as:  CARDURA Take 2 mg by mouth daily.   Fluticasone-Salmeterol 100-50 MCG/DOSE Aepb Commonly known as:  ADVAIR Inhale 1 puff into the lungs 2 (two) times daily.   hydrALAZINE 50 MG tablet Commonly known as:  APRESOLINE Take 1 tablet (50 mg total) by mouth every 8 (eight) hours.   losartan 25 MG tablet Commonly known as:  COZAAR Take 1 tablet (25 mg total) by mouth daily.   SENSIPAR 30 MG tablet Generic drug:  cinacalcet Take 1 tablet by mouth as directed. Tuesday, Thursday and saturday   zinc gluconate 50 MG tablet Take 50 mg by mouth daily.      Follow-up Information    Beckie Salts, MD. Schedule an appointment as soon as possible for a visit in 1 week(s).   Specialty:  Internal Medicine       Charolette Forward, MD. Schedule an appointment as soon as possible for a visit in 2 week(s).   Specialty:  Cardiology Contact information: [address] [place] [address] [phone]          No Known Allergies  Consultations: Cardiology,Nephrology  Procedures/Studies: Dg Chest 2 View  Result Date: 07/24/2017 CLINICAL DATA:  Subacute onset of shortness of breath. EXAM: CHEST - 2 VIEW COMPARISON:  Chest radiograph performed 07/17/2017 FINDINGS: The lungs are well-aerated. Vascular congestion is noted. Mild bibasilar airspace opacities may reflect interstitial edema. Peribronchial thickening is seen. There is no evidence of pleural effusion or pneumothorax. The heart is borderline normal in size. No acute osseous abnormalities are seen. IMPRESSION: Vascular congestion. Mild bibasilar airspace opacities may reflect  interstitial edema. Peribronchial thickening noted. Electronically Signed   By: Garald Balding M.D.   On: 07/24/2017 22:40   Nm Myocar Multi W/spect W/wall Motion / Ef  Result Date: 07/26/2017 CLINICAL DATA:  Hypertension. Short of breath. CHF. COPD. End-stage renal disease. EXAM: MYOCARDIAL IMAGING WITH SPECT (REST AND PHARMACOLOGIC-STRESS) GATED LEFT VENTRICULAR WALL MOTION STUDY LEFT VENTRICULAR EJECTION FRACTION TECHNIQUE: Standard myocardial SPECT imaging was performed after resting intravenous injection of 10 mCi Tc-66m tetrofosmin. Subsequently, intravenous infusion of Lexiscan was performed under the supervision of the Cardiology staff. At peak effect of the drug, 30 mCi Tc-35m tetrofosmin was injected intravenously and standard myocardial SPECT imaging was performed. Quantitative gated imaging was also performed to evaluate left ventricular wall motion, and estimate left ventricular ejection fraction. COMPARISON:  Chest radiograph 07/24/2017 FINDINGS: Perfusion: A medium size, mild severity apical inferior wall rest defect is identified. No areas of reversibility to suggest inducible ischemia. Wall Motion: Global hypokinesis. Left Ventricular Ejection Fraction: 35 % End diastolic volume 176 ml End systolic volume 160 ml IMPRESSION: 1. No reversible ischemia. Apical inferior wall mild rest defect could represent infarct or diaphragmatic attenuation. 2. Global hypokinesis. 3. Left ventricular ejection fraction 35% 4. Non invasive risk stratification*: Intermediate-based on ejection fraction of 35% *2012 Appropriate Use Criteria for Coronary Revascularization Focused  Update: J Am Coll Cardiol. 8588;50(2):774-128. http://content.airportbarriers.com.aspx?articleid=[email or web address] Electronically Signed   By: Abigail Miyamoto M.D.   On: 07/26/2017 14:34   Dg Chest Portable 1 View  Result Date: 07/17/2017 CLINICAL DATA:  Asthma attack last evening and this morning. Patient undergoes dialysis 3 times a week. EXAM:  PORTABLE CHEST 1 VIEW COMPARISON:  None. FINDINGS: Portable semi upright view of the chest. Mild cardiac enlargement with slight uncoiling of the thoracic aorta. Minimal aortic atherosclerosis is noted. Diffuse interstitial prominence consistent with mild interstitial edema. No alveolar consolidation, effusion or pneumothorax. No acute nor suspicious osseous abnormality. IMPRESSION: Cardiomegaly with mild interstitial edema. Aortic atherosclerosis without aneurysm. Electronically Signed   By: Ashley Royalty M.D.   On: 07/17/2017 18:17       Subjective: Patient seen and examined the bedside in the dialysis .Remains comfortable.  Denies any complaints.  No new issues/events.  Stable for discharge home today.  Discharge Exam: Vitals:   07/28/17 0945 07/28/17 1000  BP: (!) 144/76 133/82  Pulse: 75 74  Resp: 17   Temp: 97.8 F (36.6 C)   SpO2: 98%    Vitals:   07/28/17 0900 07/28/17 0930 07/28/17 0945 07/28/17 1000  BP: (!) 146/81 (!) 142/78 (!) 144/76 133/82  Pulse: 69 70 75 74  Resp:   17   Temp:   97.8 F (36.6 C)   TempSrc:   Oral   SpO2:   98%   Weight:      Height:        General: Pt is alert, awake, not in acute distress Cardiovascular: RRR, S1/S2 +, no rubs, no gallops Respiratory: CTA bilaterally, no wheezing, no rhonchi Abdominal: Soft, NT, ND, bowel sounds + Extremities: no edema, no cyanosis    The results of significant diagnostics from this hospitalization (including imaging, microbiology, ancillary and laboratory) are listed below for reference.     Microbiology: No results found for this or any previous visit (from the past 240 hour(s)).   Labs: BNP (last 3 results) Recent Labs    07/17/17 1532  BNP 7,867.6*   Basic Metabolic Panel: Recent Labs  Lab 07/25/17 0314 07/26/17 2118 07/27/17 0350 07/28/17 0633  NA 142 138 137 139  K 4.3 4.1 3.4* 4.2  CL 101 100* 96* 100*  CO2 26 25 30 24   GLUCOSE 98 114* 161* 90  BUN 37* 64* 25* 45*  CREATININE  8.32* 12.73* 7.12* 11.31*  CALCIUM 9.3 8.5* 8.7* 8.7*  PHOS  --  5.6*  --  5.3*   Liver Function Tests: Recent Labs  Lab 07/26/17 2118 07/28/17 0633  ALBUMIN 2.8* 2.7*   No results for input(s): LIPASE, AMYLASE in the last 168 hours. No results for input(s): AMMONIA in the last 168 hours. CBC: Recent Labs  Lab 07/25/17 0314 07/26/17 2119 07/27/17 0811 07/28/17 0640  WBC 3.7* 3.8* 3.4* 3.6*  NEUTROABS 2.4  --  2.2  --   HGB 8.2* 7.0* 7.5* 7.4*  HCT 25.1* 21.2* 22.9* 22.6*  MCV 90.3 88.3 88.8 89.7  PLT 133* 125* 128* 128*   Cardiac Enzymes: Recent Labs  Lab 07/25/17 0314 07/25/17 1337  TROPONINI 0.07* 0.06*   BNP: Invalid input(s): POCBNP CBG: No results for input(s): GLUCAP in the last 168 hours. D-Dimer No results for input(s): DDIMER in the last 72 hours. Hgb A1c No results for input(s): HGBA1C in the last 72 hours. Lipid Profile Recent Labs    07/27/17 0350 07/28/17 0633  CHOL 152 144  HDL 43 35*  LDLCALC 91 88  TRIG 88 104  CHOLHDL 3.5 4.1   Thyroid function studies No results for input(s): TSH, T4TOTAL, T3FREE, THYROIDAB in the last 72 hours.  Invalid input(s): FREET3 Anemia work up No results for input(s): VITAMINB12, FOLATE, FERRITIN, TIBC, IRON, RETICCTPCT in the last 72 hours. Urinalysis No results found for: COLORURINE, APPEARANCEUR, LABSPEC, [place], GLUCOSEU, HGBUR, BILIRUBINUR, KETONESUR, PROTEINUR, UROBILINOGEN, NITRITE, LEUKOCYTESUR Sepsis Labs Invalid input(s): PROCALCITONIN,  WBC,  LACTICIDVEN Microbiology No results found for this or any previous visit (from the past 240 hour(s)).  Please note: You were cared for by a hospitalist during your hospital stay. Once you are discharged, your primary care physician will handle any further medical issues. Please note that NO REFILLS for any discharge medications will be authorized once you are discharged, as it is imperative that you return to your primary care physician (or establish a  relationship with a primary care physician if you do not have one) for your post hospital discharge needs so that they can reassess your need for medications and monitor your lab values.    Time coordinating discharge: 40 minutes  SIGNED:   Shelly Coss, MD  [facility] 07/28/2017, 10:26 AM Pager [phone]  If 7PM-7AM, please contact night-coverage [email or web address] Password [email or web address]

## 2017-07-29 DIAGNOSIS — N2581 Secondary hyperparathyroidism of renal origin: Secondary | ICD-10-CM | POA: Insufficient documentation

## 2017-07-29 DIAGNOSIS — D509 Iron deficiency anemia, unspecified: Secondary | ICD-10-CM | POA: Insufficient documentation

## 2017-07-29 DIAGNOSIS — D631 Anemia in chronic kidney disease: Secondary | ICD-10-CM | POA: Insufficient documentation

## 2017-07-29 DIAGNOSIS — N189 Chronic kidney disease, unspecified: Secondary | ICD-10-CM | POA: Insufficient documentation

## 2017-07-29 DIAGNOSIS — D689 Coagulation defect, unspecified: Secondary | ICD-10-CM | POA: Insufficient documentation

## 2017-07-29 LAB — BPAM RBC
BLOOD PRODUCT EXPIRATION DATE: 201906092359
Blood Product Expiration Date: 201906092359
ISSUE DATE / TIME: 201905170944
ISSUE DATE / TIME: 201905171055
Unit Type and Rh: 5100
Unit Type and Rh: 5100

## 2017-07-29 LAB — TYPE AND SCREEN
ABO/RH(D): O POS
Antibody Screen: NEGATIVE
UNIT DIVISION: 0
UNIT DIVISION: 0

## 2017-08-04 DIAGNOSIS — C61 Malignant neoplasm of prostate: Secondary | ICD-10-CM | POA: Diagnosis not present

## 2017-08-04 DIAGNOSIS — Z992 Dependence on renal dialysis: Secondary | ICD-10-CM | POA: Diagnosis not present

## 2017-08-04 DIAGNOSIS — E785 Hyperlipidemia, unspecified: Secondary | ICD-10-CM | POA: Diagnosis not present

## 2017-08-04 DIAGNOSIS — I1311 Hypertensive heart and chronic kidney disease without heart failure, with stage 5 chronic kidney disease, or end stage renal disease: Secondary | ICD-10-CM | POA: Diagnosis not present

## 2017-08-04 DIAGNOSIS — F17290 Nicotine dependence, other tobacco product, uncomplicated: Secondary | ICD-10-CM | POA: Diagnosis not present

## 2017-08-04 DIAGNOSIS — I5020 Unspecified systolic (congestive) heart failure: Secondary | ICD-10-CM | POA: Diagnosis not present

## 2017-08-04 DIAGNOSIS — N186 End stage renal disease: Secondary | ICD-10-CM | POA: Diagnosis not present

## 2017-08-18 DIAGNOSIS — E441 Mild protein-calorie malnutrition: Secondary | ICD-10-CM | POA: Insufficient documentation

## 2017-08-22 ENCOUNTER — Ambulatory Visit (HOSPITAL_COMMUNITY)
Admission: RE | Admit: 2017-08-22 | Discharge: 2017-08-22 | Disposition: A | Discharge status: Home / Self Care | Payer: BC Managed Care – PPO | Source: Ambulatory Visit | Attending: Internal Medicine | Admitting: Internal Medicine

## 2017-08-22 DIAGNOSIS — D649 Anemia, unspecified: Secondary | ICD-10-CM | POA: Insufficient documentation

## 2017-08-22 LAB — PREPARE RBC (CROSSMATCH)

## 2017-08-22 LAB — ABO/RH: ABO/RH(D): O POS

## 2017-08-22 MED ORDER — SODIUM CHLORIDE 0.9 % IV SOLN: Freq: Once | INTRAVENOUS | Status: DC

## 2017-08-22 NOTE — Discharge Instructions (Signed)

## 2017-08-22 NOTE — Progress Notes (Signed)
PATIENT CARE CENTER NOTE  Diagnosis: Anemia    Provider: Dr. Pearson Grippe   Procedure: 2 units PRBC's    Note: Patient received 2 units of blood. Patient tolerated transfusion well with no adverse reaction. Discharge instructions given to patient. Patient alert, oriented and ambulatory at discharge.

## 2017-08-22 NOTE — Progress Notes (Signed)
Post transfusion of the first unit of Blood, patient's blood pressure was 146/67 and pulse 62. Sanford R. MD was notified. Per MD verbal order, it's okay to transfuse the second unit of blood.

## 2017-08-23 LAB — TYPE AND SCREEN
ABO/RH(D): O POS
Antibody Screen: NEGATIVE
UNIT DIVISION: 0
Unit division: 0

## 2017-08-23 LAB — BPAM RBC
BLOOD PRODUCT EXPIRATION DATE: 201907042359
Blood Product Expiration Date: 201907042359
ISSUE DATE / TIME: 201906110945
ISSUE DATE / TIME: 201906110945
UNIT TYPE AND RH: 5100
Unit Type and Rh: 5100

## 2017-08-29 ENCOUNTER — Other Ambulatory Visit (INDEPENDENT_AMBULATORY_CARE_PROVIDER_SITE_OTHER): Payer: BC Managed Care – PPO

## 2017-08-29 ENCOUNTER — Ambulatory Visit (INDEPENDENT_AMBULATORY_CARE_PROVIDER_SITE_OTHER): Payer: BC Managed Care – PPO | Admitting: Gastroenterology

## 2017-08-29 ENCOUNTER — Encounter: Payer: Self-pay | Admitting: Gastroenterology

## 2017-08-29 VITALS — BP 90/58 | HR 76 | Ht 70.75 in | Wt 187.1 lb

## 2017-08-29 DIAGNOSIS — D508 Other iron deficiency anemias: Secondary | ICD-10-CM

## 2017-08-29 DIAGNOSIS — D649 Anemia, unspecified: Secondary | ICD-10-CM

## 2017-08-29 DIAGNOSIS — Z992 Dependence on renal dialysis: Secondary | ICD-10-CM | POA: Diagnosis not present

## 2017-08-29 DIAGNOSIS — R195 Other fecal abnormalities: Secondary | ICD-10-CM

## 2017-08-29 DIAGNOSIS — N186 End stage renal disease: Secondary | ICD-10-CM

## 2017-08-29 LAB — CBC WITH DIFFERENTIAL/PLATELET
Basophils Absolute: 0 10*3/uL (ref 0.0–0.1)
Basophils Relative: 1 % (ref 0.0–3.0)
EOS ABS: 0.1 10*3/uL (ref 0.0–0.7)
EOS PCT: 3.6 % (ref 0.0–5.0)
Hemoglobin: 6.5 g/dL — CL (ref 13.0–17.0)
LYMPHS PCT: 18.5 % (ref 12.0–46.0)
Lymphs Abs: 0.7 10*3/uL (ref 0.7–4.0)
MCHC: 34.5 g/dL (ref 30.0–36.0)
MCV: 95.4 fl (ref 78.0–100.0)
MONO ABS: 0.5 10*3/uL (ref 0.1–1.0)
Monocytes Relative: 13.4 % — ABNORMAL HIGH (ref 3.0–12.0)
Neutro Abs: 2.4 10*3/uL (ref 1.4–7.7)
Neutrophils Relative %: 63.5 % (ref 43.0–77.0)
Platelets: 176 10*3/uL (ref 150.0–400.0)
RDW: 19.6 % — ABNORMAL HIGH (ref 11.5–15.5)
WBC: 3.7 10*3/uL — AB (ref 4.0–10.5)

## 2017-08-29 NOTE — Patient Instructions (Signed)
Go to the basement for labs today  You have been scheduled for an endoscopy. Please follow written instructions given to you at your visit today. If you use inhalers (even only as needed), please bring them with you on the day of your procedure. Your physician has requested that you go to www.startemmi.com and enter the access code given to you at your visit today. This web site gives a general overview about your procedure. However, you should still follow specific instructions given to you by our office regarding your preparation for the procedure.  We will try to obtain your colonoscopy report from [facility]   Follow up as needed   If you are age 40 or older, your body mass index should be between 23-30. Your Body mass index is 26.28 kg/m. If this is out of the aforementioned range listed, please consider follow up with your Primary Care Provider.  If you are age 62 or younger, your body mass index should be between 19-25. Your Body mass index is 26.28 kg/m. If this is out of the aformentioned range listed, please consider follow up with your Primary Care Provider.

## 2017-08-29 NOTE — Progress Notes (Addendum)
Dwayne Reed    [id number]    Sep 21, 1955  Primary Care Physician:Gassemi, Ronalee Belts, MD  Referring Physician: Beckie Salts, MD No address on file  Chief complaint:  Heme positive stool, anemia  HPI:  62 year old male with end-stage renal disease on hemodialysis with AV fistula, chronic anemia, COPD, CHF here for evaluation of heme positive stool and anemia. Hgb 7-8 based on labs since 2016 but he is having more transfusion requirement.  He was recently hospitalized in May with severe symptomatic anemia and was transfused.  Atypical chest pain with mild elevation of troponin was thought to be secondary to demand ischemia with severe anemia.  Based on records and labs from dialysis center patient had hemoglobin of 6.3 on May 22 and he was transfused a unit.  Denies any dark stool, melena or blood per rectum.  Fecal Hemoccult was positive.  According to patient he [facility] about 3 or 4 years ago, does not think it is over 5 years and was normal with no polyps.  Never had an EGD. He undergoes hemol dialysis on Monday-Wednesday and Friday through AV fistula  Denies any nausea, vomiting, abdominal pain, dysphagia, odynophagia, loss of appetite or weight loss.  He has severe fatigue and also shortness of breath.   Outpatient Encounter Medications as of 08/29/2017  Medication Sig  . albuterol (PROVENTIL HFA;VENTOLIN HFA) 108 (90 Base) MCG/ACT inhaler Inhale 2 puffs into the lungs every 6 (six) hours as needed for wheezing.   Marland Kitchen amLODipine (NORVASC) 5 MG tablet Take 1 tablet by mouth daily.  Marland Kitchen aspirin 81 MG EC tablet Take 1 tablet (81 mg total) by mouth daily.  Lorin Picket 1 GM 210 MG(Fe) tablet Take 1 tablet by mouth 3 (three) times daily.  . carvedilol (COREG) 25 MG tablet Take 1 tablet by mouth 2 (two) times daily.  . cinacalcet (SENSIPAR) 30 MG tablet Take 1 tablet by mouth as directed. Tuesday, Thursday and saturday  . doxazosin (CARDURA) 4  MG tablet Take 2 mg by mouth daily.  . Fluticasone-Salmeterol (ADVAIR) 100-50 MCG/DOSE AEPB Inhale 1 puff into the lungs 2 (two) times daily.  . hydrALAZINE (APRESOLINE) 50 MG tablet Take 1 tablet (50 mg total) by mouth every 8 (eight) hours.  Marland Kitchen losartan (COZAAR) 25 MG tablet Take 1 tablet (25 mg total) by mouth daily.  Marland Kitchen zinc gluconate 50 MG tablet Take 50 mg by mouth daily.   No facility-administered encounter medications on file as of 08/29/2017.     Allergies as of 08/29/2017  . (No Known Allergies)    Past Medical History:  Diagnosis Date  . CHF (congestive heart failure) ([place])   . COPD (chronic obstructive pulmonary disease) ([place])   . Hypertension   . Kidney stones   . Renal disorder    ESRD on HD (M,W,F)    Past Surgical History:  Procedure Laterality Date  . EXTRACORPOREAL SHOCK WAVE LITHOTRIPSY    . LITHOTRIPSY     x 5  . PROSTATE BIOPSY      Family History  Problem Relation Age of Onset  . Heart disease Mother   . Sarcoidosis Sister   . Cancer Maternal Grandmother        type unknown    Social History   Socioeconomic History  . Marital status: Single    Spouse name: Not on file  . Number of children: 1  . Years of education: Not on  file  . Highest education level: Not on file  Occupational History  . Occupation: admin support speacialist  Social Needs  . Financial resource strain: Not on file  . Food insecurity:    Worry: Not on file    Inability: Not on file  . Transportation needs:    Medical: Not on file    Non-medical: Not on file  Tobacco Use  . Smoking status: Former Smoker    Last attempt to quit: 2019    Years since quitting: 0.4  . Smokeless tobacco: Never Used  Substance and Sexual Activity  . Alcohol use: Yes    Frequency: Never    Comment: on holidays  . Drug use: Never  . Sexual activity: Not on file  Lifestyle  . Physical activity:    Days per week: Not on file    Minutes per session: Not on file  . Stress: Not on file    Relationships  . Social connections:    Talks on phone: Not on file    Gets together: Not on file    Attends religious service: Not on file    Active member of club or organization: Not on file    Attends meetings of clubs or organizations: Not on file    Relationship status: Not on file  . Intimate partner violence:    Fear of current or ex partner: Not on file    Emotionally abused: Not on file    Physically abused: Not on file    Forced sexual activity: Not on file  Other Topics Concern  . Not on file  Social History Narrative  . Not on file      Review of systems: Review of Systems  Constitutional: Negative for fever and chills.  Positive for fatigue HENT: Negative.   Eyes: Negative for blurred vision.  Respiratory: Negative for cough and wheezing.  Positive for shortness of breath Cardiovascular: Negative for chest pain and palpitations.  Gastrointestinal: as per HPI Genitourinary: Negative for dysuria, urgency, frequency and hematuria.  Musculoskeletal: Positive for myalgias, back pain and joint pain.  Skin: Negative for itching and rash.  Neurological: Negative for dizziness, tremors, focal weakness, seizures and loss of consciousness.  Endo/Heme/Allergies: Positive for seasonal allergies.  Psychiatric/Behavioral: Negative for depression, suicidal ideas and hallucinations.  All other systems reviewed and are negative.   Physical Exam: Vitals:   08/29/17 1405  BP: (!) 90/58  Pulse: 76   Body mass index is 26.28 kg/m. Gen:      No acute distress HEENT:  EOMI, sclera anicteric Neck:     No masses; no thyromegaly Lungs:    Clear to auscultation bilaterally; normal respiratory effort CV:         Regular rate and rhythm; no murmurs Abd:      + bowel sounds; soft, non-tender; no palpable masses, no distension Ext:    No edema; adequate peripheral perfusion Skin:      Warm and dry; no rash Neuro: alert and oriented x 3 Psych: normal mood and affect  Data  Reviewed:  Reviewed labs, radiology imaging, old records and pertinent past GI work up   Assessment and Plan/Recommendations:  62 year old male with history of end-stage renal disease on hemodialysis through AV fistula, CHF (EF 35 to 40%) with severe symptomatic anemia requiring blood transfusion more frequently in the past couple months.  Fecal Hemoccult positive no overt GI bleed. He is currently not on renal transplant list due to elevated PSA with small ?prostate cancer  According to patient he had a normal colonoscopy within the last 5 years at [facility], will try to obtain the records Recheck hemoglobin Scheduled for EGD for evaluation  The risks and benefits as well as alternatives of endoscopic procedure(s) have been discussed and reviewed. All questions answered. The patient agrees to proceed.  Damaris Hippo , MD [phone]    CC: Beckie Salts, MD

## 2017-08-30 ENCOUNTER — Encounter (HOSPITAL_COMMUNITY)
Admission: RE | Admit: 2017-08-30 | Discharge: 2017-08-30 | Disposition: A | Discharge status: Home / Self Care | Payer: BC Managed Care – PPO | Source: Ambulatory Visit | Attending: Nephrology | Admitting: Nephrology

## 2017-08-30 DIAGNOSIS — D631 Anemia in chronic kidney disease: Secondary | ICD-10-CM | POA: Insufficient documentation

## 2017-08-30 DIAGNOSIS — N186 End stage renal disease: Secondary | ICD-10-CM | POA: Diagnosis not present

## 2017-08-30 DIAGNOSIS — J449 Chronic obstructive pulmonary disease, unspecified: Secondary | ICD-10-CM | POA: Diagnosis not present

## 2017-08-30 DIAGNOSIS — I120 Hypertensive chronic kidney disease with stage 5 chronic kidney disease or end stage renal disease: Secondary | ICD-10-CM | POA: Diagnosis not present

## 2017-08-30 LAB — PREPARE RBC (CROSSMATCH)

## 2017-08-30 LAB — ABO/RH: ABO/RH(D): O POS

## 2017-08-31 ENCOUNTER — Encounter (HOSPITAL_COMMUNITY): Payer: Self-pay

## 2017-08-31 ENCOUNTER — Telehealth: Payer: Self-pay | Admitting: *Deleted

## 2017-08-31 ENCOUNTER — Encounter: Payer: Self-pay | Admitting: Gastroenterology

## 2017-08-31 ENCOUNTER — Encounter (HOSPITAL_COMMUNITY)
Admission: RE | Admit: 2017-08-31 | Discharge: 2017-08-31 | Disposition: A | Discharge status: Home / Self Care | Payer: BC Managed Care – PPO | Source: Ambulatory Visit | Attending: Nephrology | Admitting: Nephrology

## 2017-08-31 DIAGNOSIS — I120 Hypertensive chronic kidney disease with stage 5 chronic kidney disease or end stage renal disease: Secondary | ICD-10-CM | POA: Diagnosis not present

## 2017-08-31 MED ORDER — SODIUM CHLORIDE 0.9% IV SOLUTION
Freq: Once | INTRAVENOUS | Status: AC
  Administered 2017-08-31: 10:00:00 via INTRAVENOUS

## 2017-08-31 MED ORDER — DIPHENHYDRAMINE HCL 25 MG PO CAPS
25.0000 mg | ORAL_CAPSULE | Freq: Once | ORAL | Status: AC
  Administered 2017-08-31: 25 mg via ORAL
  Filled 2017-08-31: qty 1

## 2017-08-31 MED ORDER — ACETAMINOPHEN 325 MG PO TABS
650.0000 mg | ORAL_TABLET | Freq: Once | ORAL | Status: AC
  Administered 2017-08-31: 650 mg via ORAL
  Filled 2017-08-31: qty 2

## 2017-08-31 NOTE — Progress Notes (Signed)
  August 31, 2017  Patient: Dwayne Reed  Date of Birth: [birth year]  Date of Visit: 08/31/2017    To Whom It May Concern:  [name] was seen and treated in our department or urgent care center on 08/31/2017. [name] may return to work on 09/01/2017.  Sincerely,   Richarda Osmond, RN

## 2017-08-31 NOTE — Telephone Encounter (Signed)
Im trying to get in touch with patient to inform him is EGD has been changed to [facility] on 09/07/2017 at 12 noon  Cant get a working number for him

## 2017-09-01 LAB — TYPE AND SCREEN
ABO/RH(D): O POS
Antibody Screen: NEGATIVE
Unit division: 0
Unit division: 0

## 2017-09-01 LAB — BPAM RBC
BLOOD PRODUCT EXPIRATION DATE: 201907112359
Blood Product Expiration Date: 201907132359
ISSUE DATE / TIME: 201906200958
ISSUE DATE / TIME: 201906201218
UNIT TYPE AND RH: 5100
Unit Type and Rh: 5100

## 2017-09-01 NOTE — Telephone Encounter (Signed)
[facility] [phone] 

## 2017-09-01 NOTE — Telephone Encounter (Signed)
Spoke with [facility] and told them to give patient the message that his procedure date and time and location has changed...... He was there in their office getting dialysis treatment today

## 2017-09-05 ENCOUNTER — Encounter (HOSPITAL_COMMUNITY): Payer: BC Managed Care – PPO

## 2017-09-06 ENCOUNTER — Encounter (HOSPITAL_COMMUNITY): Payer: Self-pay | Admitting: *Deleted

## 2017-09-06 ENCOUNTER — Other Ambulatory Visit: Payer: Self-pay

## 2017-09-06 NOTE — Progress Notes (Signed)
Spoke with pt for pre-op call. Pt denies cardiac history, he is treated for HTN. Pt is on Hemodialysis M/W/F.  EKG - 07/25/17 - in Epic Echo - 07/26/17 - in [place] - 07/26/17 - in [place]

## 2017-09-07 ENCOUNTER — Ambulatory Visit (HOSPITAL_COMMUNITY): Payer: Medicare Other | Admitting: Certified Registered Nurse Anesthetist

## 2017-09-07 ENCOUNTER — Encounter (HOSPITAL_COMMUNITY)
Admission: RE | Disposition: A | Discharge status: Home / Self Care | Payer: Self-pay | Source: Ambulatory Visit | Attending: Gastroenterology

## 2017-09-07 ENCOUNTER — Ambulatory Visit (HOSPITAL_COMMUNITY)
Admission: RE | Admit: 2017-09-07 | Discharge: 2017-09-07 | Disposition: A | Discharge status: Home / Self Care | Payer: Medicare Other | Source: Ambulatory Visit | Attending: Gastroenterology | Admitting: Gastroenterology

## 2017-09-07 ENCOUNTER — Encounter (HOSPITAL_COMMUNITY): Payer: Self-pay | Admitting: Certified Registered Nurse Anesthetist

## 2017-09-07 DIAGNOSIS — D508 Other iron deficiency anemias: Secondary | ICD-10-CM

## 2017-09-07 DIAGNOSIS — R195 Other fecal abnormalities: Secondary | ICD-10-CM

## 2017-09-07 DIAGNOSIS — Z538 Procedure and treatment not carried out for other reasons: Secondary | ICD-10-CM | POA: Insufficient documentation

## 2017-09-07 DIAGNOSIS — I509 Heart failure, unspecified: Secondary | ICD-10-CM | POA: Insufficient documentation

## 2017-09-07 DIAGNOSIS — N186 End stage renal disease: Secondary | ICD-10-CM | POA: Insufficient documentation

## 2017-09-07 DIAGNOSIS — D649 Anemia, unspecified: Secondary | ICD-10-CM | POA: Diagnosis not present

## 2017-09-07 DIAGNOSIS — I132 Hypertensive heart and chronic kidney disease with heart failure and with stage 5 chronic kidney disease, or end stage renal disease: Secondary | ICD-10-CM | POA: Diagnosis not present

## 2017-09-07 DIAGNOSIS — Z992 Dependence on renal dialysis: Secondary | ICD-10-CM | POA: Insufficient documentation

## 2017-09-07 HISTORY — DX: Pneumonia, unspecified organism: J18.9

## 2017-09-07 HISTORY — DX: Anemia, unspecified: D64.9

## 2017-09-07 HISTORY — DX: Personal history of urinary calculi: Z87.442

## 2017-09-07 SURGERY — CANCELLED PROCEDURE
Anesthesia: Monitor Anesthesia Care

## 2017-09-07 MED ORDER — SODIUM CHLORIDE 0.9 % IV SOLN: INTRAVENOUS | Status: DC

## 2017-09-07 SURGICAL SUPPLY — 14 items

## 2017-09-07 NOTE — Anesthesia Preprocedure Evaluation (Addendum)
Anesthesia Evaluation  Patient identified by MRN, date of birth, ID band Patient awake    Reviewed: Allergy & Precautions, H&P , NPO status , Patient's Chart, lab work & pertinent test results, reviewed documented beta blocker date and time   Airway Mallampati: II  TM Distance: >3 FB Neck ROM: Full    Dental no notable dental hx. (+) Teeth Intact, Dental Advisory Given   Pulmonary COPD,  COPD inhaler, former smoker,    Pulmonary exam normal breath sounds clear to auscultation       Cardiovascular hypertension, Pt. on medications and Pt. on home beta blockers +CHF   Rhythm:Regular Rate:Normal     Neuro/Psych negative neurological ROS  negative psych ROS   GI/Hepatic negative GI ROS, Neg liver ROS,   Endo/Other  negative endocrine ROS  Renal/GU ESRF and DialysisRenal disease  negative genitourinary   Musculoskeletal   Abdominal   Peds  Hematology  (+) anemia ,   Anesthesia Other Findings   Reproductive/Obstetrics negative OB ROS                            Anesthesia Physical Anesthesia Plan  ASA: III  Anesthesia Plan: MAC   Post-op Pain Management:    Induction: Intravenous  PONV Risk Score and Plan: 1 and Propofol infusion  Airway Management Planned: Nasal Cannula  Additional Equipment:   Intra-op Plan:   Post-operative Plan:   Informed Consent: I have reviewed the patients History and Physical, chart, labs and discussed the procedure including the risks, benefits and alternatives for the proposed anesthesia with the patient or authorized representative who has indicated his/her understanding and acceptance.   Dental advisory given  Plan Discussed with: CRNA  Anesthesia Plan Comments:         Anesthesia Quick Evaluation

## 2017-09-07 NOTE — Progress Notes (Signed)
Patient admitted to Endo for EGD. Multiple attempts for IV. (see flowsheets). Patient frustrated and wanting to cancel procedure and reschedule for a different time. Dr. Silverio Decamp notified and asked patient to stay and continue with procedure due to patients probability of bleeding. Patient signed out AMA and MD notified.

## 2017-09-11 DIAGNOSIS — Z23 Encounter for immunization: Secondary | ICD-10-CM | POA: Diagnosis not present

## 2017-09-11 DIAGNOSIS — D509 Iron deficiency anemia, unspecified: Secondary | ICD-10-CM | POA: Diagnosis not present

## 2017-09-11 DIAGNOSIS — D631 Anemia in chronic kidney disease: Secondary | ICD-10-CM | POA: Diagnosis not present

## 2017-09-11 DIAGNOSIS — N2581 Secondary hyperparathyroidism of renal origin: Secondary | ICD-10-CM | POA: Diagnosis not present

## 2017-09-11 DIAGNOSIS — N186 End stage renal disease: Secondary | ICD-10-CM | POA: Diagnosis not present

## 2017-09-13 DIAGNOSIS — N186 End stage renal disease: Secondary | ICD-10-CM | POA: Diagnosis not present

## 2017-09-13 DIAGNOSIS — D509 Iron deficiency anemia, unspecified: Secondary | ICD-10-CM | POA: Diagnosis not present

## 2017-09-13 DIAGNOSIS — Z23 Encounter for immunization: Secondary | ICD-10-CM | POA: Diagnosis not present

## 2017-09-13 DIAGNOSIS — D631 Anemia in chronic kidney disease: Secondary | ICD-10-CM | POA: Diagnosis not present

## 2017-09-13 DIAGNOSIS — N2581 Secondary hyperparathyroidism of renal origin: Secondary | ICD-10-CM | POA: Diagnosis not present

## 2017-09-13 NOTE — Telephone Encounter (Signed)
Patient left Dwayne Reed on AMA refused to have his procedure.....  Dr Silverio Decamp tried to talk with patient but he hung up on her..... Will mail him a letter to call back to schedule another Endoscopy    Mailed letter out to patient today to call back and reschedule

## 2017-09-15 DIAGNOSIS — D631 Anemia in chronic kidney disease: Secondary | ICD-10-CM | POA: Diagnosis not present

## 2017-09-15 DIAGNOSIS — Z23 Encounter for immunization: Secondary | ICD-10-CM | POA: Diagnosis not present

## 2017-09-15 DIAGNOSIS — D509 Iron deficiency anemia, unspecified: Secondary | ICD-10-CM | POA: Diagnosis not present

## 2017-09-15 DIAGNOSIS — N2581 Secondary hyperparathyroidism of renal origin: Secondary | ICD-10-CM | POA: Diagnosis not present

## 2017-09-15 DIAGNOSIS — N186 End stage renal disease: Secondary | ICD-10-CM | POA: Diagnosis not present

## 2017-09-18 ENCOUNTER — Ambulatory Visit: Payer: BC Managed Care – PPO | Admitting: Family Medicine

## 2017-09-18 DIAGNOSIS — N186 End stage renal disease: Secondary | ICD-10-CM | POA: Diagnosis not present

## 2017-09-18 DIAGNOSIS — N2581 Secondary hyperparathyroidism of renal origin: Secondary | ICD-10-CM | POA: Diagnosis not present

## 2017-09-18 DIAGNOSIS — D509 Iron deficiency anemia, unspecified: Secondary | ICD-10-CM | POA: Diagnosis not present

## 2017-09-18 DIAGNOSIS — Z23 Encounter for immunization: Secondary | ICD-10-CM | POA: Diagnosis not present

## 2017-09-18 DIAGNOSIS — Z0289 Encounter for other administrative examinations: Secondary | ICD-10-CM

## 2017-09-18 DIAGNOSIS — D631 Anemia in chronic kidney disease: Secondary | ICD-10-CM | POA: Diagnosis not present

## 2017-09-20 DIAGNOSIS — D631 Anemia in chronic kidney disease: Secondary | ICD-10-CM | POA: Diagnosis not present

## 2017-09-20 DIAGNOSIS — D509 Iron deficiency anemia, unspecified: Secondary | ICD-10-CM | POA: Diagnosis not present

## 2017-09-20 DIAGNOSIS — Z23 Encounter for immunization: Secondary | ICD-10-CM | POA: Diagnosis not present

## 2017-09-20 DIAGNOSIS — N186 End stage renal disease: Secondary | ICD-10-CM | POA: Diagnosis not present

## 2017-09-20 DIAGNOSIS — N2581 Secondary hyperparathyroidism of renal origin: Secondary | ICD-10-CM | POA: Diagnosis not present

## 2017-09-22 DIAGNOSIS — N186 End stage renal disease: Secondary | ICD-10-CM | POA: Diagnosis not present

## 2017-09-22 DIAGNOSIS — D631 Anemia in chronic kidney disease: Secondary | ICD-10-CM | POA: Diagnosis not present

## 2017-09-22 DIAGNOSIS — N2581 Secondary hyperparathyroidism of renal origin: Secondary | ICD-10-CM | POA: Diagnosis not present

## 2017-09-22 DIAGNOSIS — D509 Iron deficiency anemia, unspecified: Secondary | ICD-10-CM | POA: Diagnosis not present

## 2017-09-22 DIAGNOSIS — Z23 Encounter for immunization: Secondary | ICD-10-CM | POA: Diagnosis not present

## 2017-09-25 DIAGNOSIS — Z23 Encounter for immunization: Secondary | ICD-10-CM | POA: Diagnosis not present

## 2017-09-25 DIAGNOSIS — D631 Anemia in chronic kidney disease: Secondary | ICD-10-CM | POA: Diagnosis not present

## 2017-09-25 DIAGNOSIS — N2581 Secondary hyperparathyroidism of renal origin: Secondary | ICD-10-CM | POA: Diagnosis not present

## 2017-09-25 DIAGNOSIS — N186 End stage renal disease: Secondary | ICD-10-CM | POA: Diagnosis not present

## 2017-09-25 DIAGNOSIS — D509 Iron deficiency anemia, unspecified: Secondary | ICD-10-CM | POA: Diagnosis not present

## 2017-09-27 DIAGNOSIS — D631 Anemia in chronic kidney disease: Secondary | ICD-10-CM | POA: Diagnosis not present

## 2017-09-27 DIAGNOSIS — D509 Iron deficiency anemia, unspecified: Secondary | ICD-10-CM | POA: Diagnosis not present

## 2017-09-27 DIAGNOSIS — N186 End stage renal disease: Secondary | ICD-10-CM | POA: Diagnosis not present

## 2017-09-27 DIAGNOSIS — Z23 Encounter for immunization: Secondary | ICD-10-CM | POA: Diagnosis not present

## 2017-09-27 DIAGNOSIS — N2581 Secondary hyperparathyroidism of renal origin: Secondary | ICD-10-CM | POA: Diagnosis not present

## 2017-09-28 ENCOUNTER — Encounter: Payer: BC Managed Care – PPO | Admitting: Gastroenterology

## 2017-09-28 DIAGNOSIS — R3121 Asymptomatic microscopic hematuria: Secondary | ICD-10-CM | POA: Diagnosis not present

## 2017-09-28 DIAGNOSIS — Z85528 Personal history of other malignant neoplasm of kidney: Secondary | ICD-10-CM | POA: Diagnosis not present

## 2017-09-28 DIAGNOSIS — C61 Malignant neoplasm of prostate: Secondary | ICD-10-CM | POA: Diagnosis not present

## 2017-09-28 DIAGNOSIS — N200 Calculus of kidney: Secondary | ICD-10-CM | POA: Diagnosis not present

## 2017-09-29 DIAGNOSIS — D631 Anemia in chronic kidney disease: Secondary | ICD-10-CM | POA: Diagnosis not present

## 2017-09-29 DIAGNOSIS — Z23 Encounter for immunization: Secondary | ICD-10-CM | POA: Diagnosis not present

## 2017-09-29 DIAGNOSIS — N2581 Secondary hyperparathyroidism of renal origin: Secondary | ICD-10-CM | POA: Diagnosis not present

## 2017-09-29 DIAGNOSIS — D509 Iron deficiency anemia, unspecified: Secondary | ICD-10-CM | POA: Diagnosis not present

## 2017-09-29 DIAGNOSIS — N186 End stage renal disease: Secondary | ICD-10-CM | POA: Diagnosis not present

## 2017-10-02 DIAGNOSIS — N2581 Secondary hyperparathyroidism of renal origin: Secondary | ICD-10-CM | POA: Diagnosis not present

## 2017-10-02 DIAGNOSIS — D509 Iron deficiency anemia, unspecified: Secondary | ICD-10-CM | POA: Diagnosis not present

## 2017-10-02 DIAGNOSIS — D631 Anemia in chronic kidney disease: Secondary | ICD-10-CM | POA: Diagnosis not present

## 2017-10-02 DIAGNOSIS — Z23 Encounter for immunization: Secondary | ICD-10-CM | POA: Diagnosis not present

## 2017-10-02 DIAGNOSIS — N186 End stage renal disease: Secondary | ICD-10-CM | POA: Diagnosis not present

## 2017-10-04 DIAGNOSIS — D631 Anemia in chronic kidney disease: Secondary | ICD-10-CM | POA: Diagnosis not present

## 2017-10-04 DIAGNOSIS — N186 End stage renal disease: Secondary | ICD-10-CM | POA: Diagnosis not present

## 2017-10-04 DIAGNOSIS — D509 Iron deficiency anemia, unspecified: Secondary | ICD-10-CM | POA: Diagnosis not present

## 2017-10-04 DIAGNOSIS — N2581 Secondary hyperparathyroidism of renal origin: Secondary | ICD-10-CM | POA: Diagnosis not present

## 2017-10-04 DIAGNOSIS — Z23 Encounter for immunization: Secondary | ICD-10-CM | POA: Diagnosis not present

## 2017-10-06 DIAGNOSIS — N186 End stage renal disease: Secondary | ICD-10-CM | POA: Diagnosis not present

## 2017-10-06 DIAGNOSIS — Z23 Encounter for immunization: Secondary | ICD-10-CM | POA: Diagnosis not present

## 2017-10-06 DIAGNOSIS — D631 Anemia in chronic kidney disease: Secondary | ICD-10-CM | POA: Diagnosis not present

## 2017-10-06 DIAGNOSIS — N2581 Secondary hyperparathyroidism of renal origin: Secondary | ICD-10-CM | POA: Diagnosis not present

## 2017-10-06 DIAGNOSIS — D509 Iron deficiency anemia, unspecified: Secondary | ICD-10-CM | POA: Diagnosis not present

## 2017-10-09 DIAGNOSIS — D631 Anemia in chronic kidney disease: Secondary | ICD-10-CM | POA: Diagnosis not present

## 2017-10-09 DIAGNOSIS — Z23 Encounter for immunization: Secondary | ICD-10-CM | POA: Diagnosis not present

## 2017-10-09 DIAGNOSIS — N2581 Secondary hyperparathyroidism of renal origin: Secondary | ICD-10-CM | POA: Diagnosis not present

## 2017-10-09 DIAGNOSIS — N186 End stage renal disease: Secondary | ICD-10-CM | POA: Diagnosis not present

## 2017-10-09 DIAGNOSIS — D509 Iron deficiency anemia, unspecified: Secondary | ICD-10-CM | POA: Diagnosis not present

## 2017-10-11 DIAGNOSIS — Z23 Encounter for immunization: Secondary | ICD-10-CM | POA: Diagnosis not present

## 2017-10-11 DIAGNOSIS — N186 End stage renal disease: Secondary | ICD-10-CM | POA: Diagnosis not present

## 2017-10-11 DIAGNOSIS — D631 Anemia in chronic kidney disease: Secondary | ICD-10-CM | POA: Diagnosis not present

## 2017-10-11 DIAGNOSIS — Z992 Dependence on renal dialysis: Secondary | ICD-10-CM | POA: Diagnosis not present

## 2017-10-11 DIAGNOSIS — D509 Iron deficiency anemia, unspecified: Secondary | ICD-10-CM | POA: Diagnosis not present

## 2017-10-11 DIAGNOSIS — N2581 Secondary hyperparathyroidism of renal origin: Secondary | ICD-10-CM | POA: Diagnosis not present

## 2017-10-11 DIAGNOSIS — I120 Hypertensive chronic kidney disease with stage 5 chronic kidney disease or end stage renal disease: Secondary | ICD-10-CM | POA: Diagnosis not present

## 2017-10-13 DIAGNOSIS — D509 Iron deficiency anemia, unspecified: Secondary | ICD-10-CM | POA: Diagnosis not present

## 2017-10-13 DIAGNOSIS — N2581 Secondary hyperparathyroidism of renal origin: Secondary | ICD-10-CM | POA: Diagnosis not present

## 2017-10-13 DIAGNOSIS — N186 End stage renal disease: Secondary | ICD-10-CM | POA: Diagnosis not present

## 2017-10-13 DIAGNOSIS — D631 Anemia in chronic kidney disease: Secondary | ICD-10-CM | POA: Diagnosis not present

## 2017-10-16 DIAGNOSIS — N2581 Secondary hyperparathyroidism of renal origin: Secondary | ICD-10-CM | POA: Diagnosis not present

## 2017-10-16 DIAGNOSIS — D631 Anemia in chronic kidney disease: Secondary | ICD-10-CM | POA: Diagnosis not present

## 2017-10-16 DIAGNOSIS — N186 End stage renal disease: Secondary | ICD-10-CM | POA: Diagnosis not present

## 2017-10-16 DIAGNOSIS — D509 Iron deficiency anemia, unspecified: Secondary | ICD-10-CM | POA: Diagnosis not present

## 2017-10-17 DIAGNOSIS — N200 Calculus of kidney: Secondary | ICD-10-CM | POA: Diagnosis not present

## 2017-10-17 DIAGNOSIS — C61 Malignant neoplasm of prostate: Secondary | ICD-10-CM | POA: Diagnosis not present

## 2017-10-18 DIAGNOSIS — N186 End stage renal disease: Secondary | ICD-10-CM | POA: Diagnosis not present

## 2017-10-18 DIAGNOSIS — D631 Anemia in chronic kidney disease: Secondary | ICD-10-CM | POA: Diagnosis not present

## 2017-10-18 DIAGNOSIS — N2581 Secondary hyperparathyroidism of renal origin: Secondary | ICD-10-CM | POA: Diagnosis not present

## 2017-10-18 DIAGNOSIS — D509 Iron deficiency anemia, unspecified: Secondary | ICD-10-CM | POA: Diagnosis not present

## 2017-10-20 DIAGNOSIS — D631 Anemia in chronic kidney disease: Secondary | ICD-10-CM | POA: Diagnosis not present

## 2017-10-20 DIAGNOSIS — N186 End stage renal disease: Secondary | ICD-10-CM | POA: Diagnosis not present

## 2017-10-20 DIAGNOSIS — N2581 Secondary hyperparathyroidism of renal origin: Secondary | ICD-10-CM | POA: Diagnosis not present

## 2017-10-20 DIAGNOSIS — D509 Iron deficiency anemia, unspecified: Secondary | ICD-10-CM | POA: Diagnosis not present

## 2017-10-23 ENCOUNTER — Encounter: Payer: Self-pay | Admitting: Family Medicine

## 2017-10-23 ENCOUNTER — Ambulatory Visit (INDEPENDENT_AMBULATORY_CARE_PROVIDER_SITE_OTHER): Payer: BC Managed Care – PPO | Admitting: Family Medicine

## 2017-10-23 VITALS — BP 120/64 | HR 77 | Temp 98.3°F | Ht 70.75 in | Wt 190.5 lb

## 2017-10-23 DIAGNOSIS — D631 Anemia in chronic kidney disease: Secondary | ICD-10-CM | POA: Diagnosis not present

## 2017-10-23 DIAGNOSIS — D509 Iron deficiency anemia, unspecified: Secondary | ICD-10-CM | POA: Diagnosis not present

## 2017-10-23 DIAGNOSIS — N2581 Secondary hyperparathyroidism of renal origin: Secondary | ICD-10-CM | POA: Diagnosis not present

## 2017-10-23 DIAGNOSIS — R062 Wheezing: Secondary | ICD-10-CM | POA: Diagnosis not present

## 2017-10-23 DIAGNOSIS — Z7689 Persons encountering health services in other specified circumstances: Secondary | ICD-10-CM | POA: Diagnosis not present

## 2017-10-23 DIAGNOSIS — I10 Essential (primary) hypertension: Secondary | ICD-10-CM | POA: Diagnosis not present

## 2017-10-23 DIAGNOSIS — N186 End stage renal disease: Secondary | ICD-10-CM | POA: Diagnosis not present

## 2017-10-23 DIAGNOSIS — R5383 Other fatigue: Secondary | ICD-10-CM | POA: Insufficient documentation

## 2017-10-23 DIAGNOSIS — T82510A Breakdown (mechanical) of surgically created arteriovenous fistula, initial encounter: Secondary | ICD-10-CM | POA: Insufficient documentation

## 2017-10-23 DIAGNOSIS — N184 Chronic kidney disease, stage 4 (severe): Secondary | ICD-10-CM

## 2017-10-23 MED ORDER — ALBUTEROL SULFATE HFA 108 (90 BASE) MCG/ACT IN AERS: 2.0000 | INHALATION_SPRAY | Freq: Four times a day (QID) | RESPIRATORY_TRACT | 3 refills | Status: DC | PRN

## 2017-10-23 NOTE — Patient Instructions (Signed)
Good to see you today  Please follow up in 3-4 months for your complete physical

## 2017-10-23 NOTE — Progress Notes (Signed)
Subjective:    Patient ID: Dwayne Reed, male    DOB: April 06, 1955, 62 y.o.   MRN: [id number]  HPI This is a 62 yo male who presents today to establish care. Previously patient of Dr. Genevie Cheshire.  Moved to [facility] from [place].  Enjoys sports. Works at [employer], works in Youth worker. Lives with wife.   Last CPE- overdue PSA- followed by urology Colonoscopy- thinks it was 2 years ago, done in [facility], per patient 5 year recall Tdap- unsure Flu- annual Dental- regular Exercise- active  PSA- elevated, biopsy, having second biopsy. Sees Dr. Monia Pouch. Was on kidney transplant list but was taken off due to elevated PSA.   Anemia- unknown cause, was admitted for EGD, left AMA when they were unable to get IV access.   Nephrolitatsis- no symptoms recently  CKD- on dialysis, voids some. Dr. Willene Hatchet- nephrologist  No headaches, no dizziness, no chest pain, no SOB, Pain at base of thumbs. No abdominal pain, nausea/vomiting/diarrhea/constipation/blood or dark stools. Requests refill of albuterol inhaler for rare wheezing.    Past Medical History:  Diagnosis Date  . Anemia    low iron  . CHF (congestive heart failure) ([place])    pt denies  . COPD (chronic obstructive pulmonary disease) ([place])   . History of kidney stones   . Hypertension   . Kidney stones   . Pneumonia   . 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 EXTRACORPOREAL SHOCK WAVE LITHOTRIPSY    . LITHOTRIPSY     x 5  . PROSTATE BIOPSY     Family History  Problem Relation Age of Onset  . Heart disease Mother   . Sarcoidosis Sister   . Cancer Maternal Grandmother        type unknown   Social History   Tobacco Use  . Smoking status: Former Smoker    Last attempt to quit: 2019    Years since quitting: 0.6  . Smokeless tobacco: Never Used  Substance Use Topics  . Alcohol use: Yes    Frequency: Never    Comment: on holidays  . Drug use: Never       Review of Systems Per HPI    Objective:   Physical Exam  Constitutional: He is oriented to person, place, and time. He appears well-developed and well-nourished.  HENT:  Head: Normocephalic and atraumatic.  Eyes: Conjunctivae are normal.  Cardiovascular: Normal rate, regular rhythm and normal heart sounds.  Pulmonary/Chest: Effort normal and breath sounds normal.  Musculoskeletal: He exhibits no edema.  Neurological: He is alert and oriented to person, place, and time.  Skin: Skin is warm and dry.  R UE bandage over fistula clean and dry.   Psychiatric: He has a normal mood and affect. His behavior is normal. Judgment and thought content normal.      BP 120/64 (BP Location: Left Arm, Patient Position: Sitting, Cuff Size: Normal)   Pulse 77   Temp 98.3 F (36.8 C) (Oral)   Ht 5' 10.75" (1.797 m)   Wt 190 lb 8 oz (86.4 kg)   SpO2 97%   BMI 26.76 kg/m      Assessment & Plan:  1. Chronic kidney disease, stage IV (severe) (HCC) - CBC with Differential - Comprehensive metabolic panel  2. Essential hypertension - CBC with Differential - Comprehensive metabolic panel  3. Wheeze - albuterol (PROVENTIL HFA;VENTOLIN HFA) 108 (90 Base) MCG/ACT inhaler; Inhale 2  puffs into the lungs every 6 (six) hours as needed for wheezing.  Dispense: 1 Inhaler; Refill: 3  4. Encounter to establish care - will request outside records and have him follow up for CPE in 3-4 months   Clarene Reamer, FNP-BC  [facility], [place]  10/27/2017 5:39 AM

## 2017-10-24 ENCOUNTER — Telehealth: Payer: Self-pay | Admitting: Radiology

## 2017-10-24 LAB — COMPREHENSIVE METABOLIC PANEL
ALK PHOS: 76 U/L (ref 39–117)
ALT: 9 U/L (ref 0–53)
AST: 10 U/L (ref 0–37)
Albumin: 4.1 g/dL (ref 3.5–5.2)
BUN: 32 mg/dL — AB (ref 6–23)
CHLORIDE: 95 meq/L — AB (ref 96–112)
CO2: 34 mEq/L — ABNORMAL HIGH (ref 19–32)
CREATININE: 9.07 mg/dL — AB (ref 0.40–1.50)
Calcium: 10.2 mg/dL (ref 8.4–10.5)
GFR: 7.61 mL/min — CL (ref >60.00)
GLUCOSE: 83 mg/dL (ref 70–99)
POTASSIUM: 4.1 meq/L (ref 3.5–5.1)
SODIUM: 137 meq/L (ref 135–145)
TOTAL PROTEIN: 7.2 g/dL (ref 6.0–8.3)
Total Bilirubin: 0.4 mg/dL (ref 0.2–1.2)

## 2017-10-24 LAB — CBC WITH DIFFERENTIAL/PLATELET
Basophils Absolute: 0 10*3/uL (ref 0.0–0.1)
Basophils Relative: 0.5 % (ref 0.0–3.0)
EOS ABS: 0.2 10*3/uL (ref 0.0–0.7)
EOS PCT: 4.4 % (ref 0.0–5.0)
HCT: 32.7 % — ABNORMAL LOW (ref 39.0–52.0)
Hemoglobin: 11.3 g/dL — ABNORMAL LOW (ref 13.0–17.0)
LYMPHS ABS: 0.9 10*3/uL (ref 0.7–4.0)
Lymphocytes Relative: 22 % (ref 12.0–46.0)
MCHC: 34.6 g/dL (ref 30.0–36.0)
MCV: 93 fl (ref 78.0–100.0)
MONO ABS: 0.6 10*3/uL (ref 0.1–1.0)
Monocytes Relative: 14.7 % — ABNORMAL HIGH (ref 3.0–12.0)
NEUTROS PCT: 58.4 % (ref 43.0–77.0)
Neutro Abs: 2.4 10*3/uL (ref 1.4–7.7)
Platelets: 168 10*3/uL (ref 150.0–400.0)
RBC: 3.52 Mil/uL — ABNORMAL LOW (ref 4.22–5.81)
RDW: 16.7 % — ABNORMAL HIGH (ref 11.5–15.5)
WBC: 4.1 10*3/uL (ref 4.0–10.5)

## 2017-10-24 NOTE — Telephone Encounter (Signed)
ESRD on dialysis

## 2017-10-24 NOTE — Telephone Encounter (Signed)
[facility] lac called critical results, CRT - 9.07, GFR - 7.61. Results given to Dr Diona Browner

## 2017-10-25 DIAGNOSIS — D631 Anemia in chronic kidney disease: Secondary | ICD-10-CM | POA: Diagnosis not present

## 2017-10-25 DIAGNOSIS — D509 Iron deficiency anemia, unspecified: Secondary | ICD-10-CM | POA: Diagnosis not present

## 2017-10-25 DIAGNOSIS — N186 End stage renal disease: Secondary | ICD-10-CM | POA: Diagnosis not present

## 2017-10-25 DIAGNOSIS — N2581 Secondary hyperparathyroidism of renal origin: Secondary | ICD-10-CM | POA: Diagnosis not present

## 2017-10-25 NOTE — Telephone Encounter (Signed)
Aware. 

## 2017-10-27 ENCOUNTER — Encounter: Payer: Self-pay | Admitting: Family Medicine

## 2017-10-27 DIAGNOSIS — D509 Iron deficiency anemia, unspecified: Secondary | ICD-10-CM | POA: Diagnosis not present

## 2017-10-27 DIAGNOSIS — D631 Anemia in chronic kidney disease: Secondary | ICD-10-CM | POA: Diagnosis not present

## 2017-10-27 DIAGNOSIS — N186 End stage renal disease: Secondary | ICD-10-CM | POA: Diagnosis not present

## 2017-10-27 DIAGNOSIS — N2581 Secondary hyperparathyroidism of renal origin: Secondary | ICD-10-CM | POA: Diagnosis not present

## 2017-10-30 DIAGNOSIS — N2581 Secondary hyperparathyroidism of renal origin: Secondary | ICD-10-CM | POA: Diagnosis not present

## 2017-10-30 DIAGNOSIS — N186 End stage renal disease: Secondary | ICD-10-CM | POA: Diagnosis not present

## 2017-10-30 DIAGNOSIS — D631 Anemia in chronic kidney disease: Secondary | ICD-10-CM | POA: Diagnosis not present

## 2017-10-30 DIAGNOSIS — D509 Iron deficiency anemia, unspecified: Secondary | ICD-10-CM | POA: Diagnosis not present

## 2017-11-01 DIAGNOSIS — D631 Anemia in chronic kidney disease: Secondary | ICD-10-CM | POA: Diagnosis not present

## 2017-11-01 DIAGNOSIS — D509 Iron deficiency anemia, unspecified: Secondary | ICD-10-CM | POA: Diagnosis not present

## 2017-11-01 DIAGNOSIS — N2581 Secondary hyperparathyroidism of renal origin: Secondary | ICD-10-CM | POA: Diagnosis not present

## 2017-11-01 DIAGNOSIS — N186 End stage renal disease: Secondary | ICD-10-CM | POA: Diagnosis not present

## 2017-11-03 DIAGNOSIS — D631 Anemia in chronic kidney disease: Secondary | ICD-10-CM | POA: Diagnosis not present

## 2017-11-03 DIAGNOSIS — D509 Iron deficiency anemia, unspecified: Secondary | ICD-10-CM | POA: Diagnosis not present

## 2017-11-03 DIAGNOSIS — N186 End stage renal disease: Secondary | ICD-10-CM | POA: Diagnosis not present

## 2017-11-03 DIAGNOSIS — N2581 Secondary hyperparathyroidism of renal origin: Secondary | ICD-10-CM | POA: Diagnosis not present

## 2017-11-06 DIAGNOSIS — N186 End stage renal disease: Secondary | ICD-10-CM | POA: Diagnosis not present

## 2017-11-06 DIAGNOSIS — D631 Anemia in chronic kidney disease: Secondary | ICD-10-CM | POA: Diagnosis not present

## 2017-11-06 DIAGNOSIS — D509 Iron deficiency anemia, unspecified: Secondary | ICD-10-CM | POA: Diagnosis not present

## 2017-11-06 DIAGNOSIS — N2581 Secondary hyperparathyroidism of renal origin: Secondary | ICD-10-CM | POA: Diagnosis not present

## 2017-11-08 DIAGNOSIS — N2581 Secondary hyperparathyroidism of renal origin: Secondary | ICD-10-CM | POA: Diagnosis not present

## 2017-11-08 DIAGNOSIS — D631 Anemia in chronic kidney disease: Secondary | ICD-10-CM | POA: Diagnosis not present

## 2017-11-08 DIAGNOSIS — N186 End stage renal disease: Secondary | ICD-10-CM | POA: Diagnosis not present

## 2017-11-08 DIAGNOSIS — D509 Iron deficiency anemia, unspecified: Secondary | ICD-10-CM | POA: Diagnosis not present

## 2017-11-10 DIAGNOSIS — N2581 Secondary hyperparathyroidism of renal origin: Secondary | ICD-10-CM | POA: Diagnosis not present

## 2017-11-10 DIAGNOSIS — N186 End stage renal disease: Secondary | ICD-10-CM | POA: Diagnosis not present

## 2017-11-10 DIAGNOSIS — D509 Iron deficiency anemia, unspecified: Secondary | ICD-10-CM | POA: Diagnosis not present

## 2017-11-10 DIAGNOSIS — D631 Anemia in chronic kidney disease: Secondary | ICD-10-CM | POA: Diagnosis not present

## 2017-11-11 DIAGNOSIS — I129 Hypertensive chronic kidney disease with stage 1 through stage 4 chronic kidney disease, or unspecified chronic kidney disease: Secondary | ICD-10-CM | POA: Diagnosis not present

## 2017-11-11 DIAGNOSIS — N186 End stage renal disease: Secondary | ICD-10-CM | POA: Diagnosis not present

## 2017-11-11 DIAGNOSIS — Z992 Dependence on renal dialysis: Secondary | ICD-10-CM | POA: Diagnosis not present

## 2017-11-13 DIAGNOSIS — N2581 Secondary hyperparathyroidism of renal origin: Secondary | ICD-10-CM | POA: Diagnosis not present

## 2017-11-13 DIAGNOSIS — D631 Anemia in chronic kidney disease: Secondary | ICD-10-CM | POA: Diagnosis not present

## 2017-11-13 DIAGNOSIS — D509 Iron deficiency anemia, unspecified: Secondary | ICD-10-CM | POA: Diagnosis not present

## 2017-11-13 DIAGNOSIS — Z23 Encounter for immunization: Secondary | ICD-10-CM | POA: Diagnosis not present

## 2017-11-13 DIAGNOSIS — N186 End stage renal disease: Secondary | ICD-10-CM | POA: Diagnosis not present

## 2017-11-15 DIAGNOSIS — N2581 Secondary hyperparathyroidism of renal origin: Secondary | ICD-10-CM | POA: Diagnosis not present

## 2017-11-15 DIAGNOSIS — D631 Anemia in chronic kidney disease: Secondary | ICD-10-CM | POA: Diagnosis not present

## 2017-11-15 DIAGNOSIS — N186 End stage renal disease: Secondary | ICD-10-CM | POA: Diagnosis not present

## 2017-11-15 DIAGNOSIS — D509 Iron deficiency anemia, unspecified: Secondary | ICD-10-CM | POA: Diagnosis not present

## 2017-11-15 DIAGNOSIS — Z23 Encounter for immunization: Secondary | ICD-10-CM | POA: Diagnosis not present

## 2017-11-17 DIAGNOSIS — N2581 Secondary hyperparathyroidism of renal origin: Secondary | ICD-10-CM | POA: Diagnosis not present

## 2017-11-17 DIAGNOSIS — N186 End stage renal disease: Secondary | ICD-10-CM | POA: Diagnosis not present

## 2017-11-17 DIAGNOSIS — D509 Iron deficiency anemia, unspecified: Secondary | ICD-10-CM | POA: Diagnosis not present

## 2017-11-17 DIAGNOSIS — Z23 Encounter for immunization: Secondary | ICD-10-CM | POA: Diagnosis not present

## 2017-11-17 DIAGNOSIS — D631 Anemia in chronic kidney disease: Secondary | ICD-10-CM | POA: Diagnosis not present

## 2017-11-20 DIAGNOSIS — D509 Iron deficiency anemia, unspecified: Secondary | ICD-10-CM | POA: Diagnosis not present

## 2017-11-20 DIAGNOSIS — D631 Anemia in chronic kidney disease: Secondary | ICD-10-CM | POA: Diagnosis not present

## 2017-11-20 DIAGNOSIS — N186 End stage renal disease: Secondary | ICD-10-CM | POA: Diagnosis not present

## 2017-11-20 DIAGNOSIS — Z23 Encounter for immunization: Secondary | ICD-10-CM | POA: Diagnosis not present

## 2017-11-20 DIAGNOSIS — N2581 Secondary hyperparathyroidism of renal origin: Secondary | ICD-10-CM | POA: Diagnosis not present

## 2017-11-22 DIAGNOSIS — D631 Anemia in chronic kidney disease: Secondary | ICD-10-CM | POA: Diagnosis not present

## 2017-11-22 DIAGNOSIS — Z23 Encounter for immunization: Secondary | ICD-10-CM | POA: Diagnosis not present

## 2017-11-22 DIAGNOSIS — D509 Iron deficiency anemia, unspecified: Secondary | ICD-10-CM | POA: Diagnosis not present

## 2017-11-22 DIAGNOSIS — N186 End stage renal disease: Secondary | ICD-10-CM | POA: Diagnosis not present

## 2017-11-22 DIAGNOSIS — N2581 Secondary hyperparathyroidism of renal origin: Secondary | ICD-10-CM | POA: Diagnosis not present

## 2017-11-24 DIAGNOSIS — D509 Iron deficiency anemia, unspecified: Secondary | ICD-10-CM | POA: Diagnosis not present

## 2017-11-24 DIAGNOSIS — D631 Anemia in chronic kidney disease: Secondary | ICD-10-CM | POA: Diagnosis not present

## 2017-11-24 DIAGNOSIS — Z23 Encounter for immunization: Secondary | ICD-10-CM | POA: Diagnosis not present

## 2017-11-24 DIAGNOSIS — N2581 Secondary hyperparathyroidism of renal origin: Secondary | ICD-10-CM | POA: Diagnosis not present

## 2017-11-24 DIAGNOSIS — N186 End stage renal disease: Secondary | ICD-10-CM | POA: Diagnosis not present

## 2017-11-27 DIAGNOSIS — Z23 Encounter for immunization: Secondary | ICD-10-CM | POA: Diagnosis not present

## 2017-11-27 DIAGNOSIS — D509 Iron deficiency anemia, unspecified: Secondary | ICD-10-CM | POA: Diagnosis not present

## 2017-11-27 DIAGNOSIS — N2581 Secondary hyperparathyroidism of renal origin: Secondary | ICD-10-CM | POA: Diagnosis not present

## 2017-11-27 DIAGNOSIS — N186 End stage renal disease: Secondary | ICD-10-CM | POA: Diagnosis not present

## 2017-11-27 DIAGNOSIS — D631 Anemia in chronic kidney disease: Secondary | ICD-10-CM | POA: Diagnosis not present

## 2017-11-29 DIAGNOSIS — Z23 Encounter for immunization: Secondary | ICD-10-CM | POA: Diagnosis not present

## 2017-11-29 DIAGNOSIS — D509 Iron deficiency anemia, unspecified: Secondary | ICD-10-CM | POA: Diagnosis not present

## 2017-11-29 DIAGNOSIS — N2581 Secondary hyperparathyroidism of renal origin: Secondary | ICD-10-CM | POA: Diagnosis not present

## 2017-11-29 DIAGNOSIS — D631 Anemia in chronic kidney disease: Secondary | ICD-10-CM | POA: Diagnosis not present

## 2017-11-29 DIAGNOSIS — N186 End stage renal disease: Secondary | ICD-10-CM | POA: Diagnosis not present

## 2017-12-01 DIAGNOSIS — N186 End stage renal disease: Secondary | ICD-10-CM | POA: Diagnosis not present

## 2017-12-01 DIAGNOSIS — D509 Iron deficiency anemia, unspecified: Secondary | ICD-10-CM | POA: Diagnosis not present

## 2017-12-01 DIAGNOSIS — N2581 Secondary hyperparathyroidism of renal origin: Secondary | ICD-10-CM | POA: Diagnosis not present

## 2017-12-01 DIAGNOSIS — D631 Anemia in chronic kidney disease: Secondary | ICD-10-CM | POA: Diagnosis not present

## 2017-12-01 DIAGNOSIS — Z23 Encounter for immunization: Secondary | ICD-10-CM | POA: Diagnosis not present

## 2017-12-04 DIAGNOSIS — Z23 Encounter for immunization: Secondary | ICD-10-CM | POA: Diagnosis not present

## 2017-12-04 DIAGNOSIS — N2581 Secondary hyperparathyroidism of renal origin: Secondary | ICD-10-CM | POA: Diagnosis not present

## 2017-12-04 DIAGNOSIS — N186 End stage renal disease: Secondary | ICD-10-CM | POA: Diagnosis not present

## 2017-12-04 DIAGNOSIS — D631 Anemia in chronic kidney disease: Secondary | ICD-10-CM | POA: Diagnosis not present

## 2017-12-04 DIAGNOSIS — D509 Iron deficiency anemia, unspecified: Secondary | ICD-10-CM | POA: Diagnosis not present

## 2017-12-06 DIAGNOSIS — D509 Iron deficiency anemia, unspecified: Secondary | ICD-10-CM | POA: Diagnosis not present

## 2017-12-06 DIAGNOSIS — D631 Anemia in chronic kidney disease: Secondary | ICD-10-CM | POA: Diagnosis not present

## 2017-12-06 DIAGNOSIS — Z23 Encounter for immunization: Secondary | ICD-10-CM | POA: Diagnosis not present

## 2017-12-06 DIAGNOSIS — N2581 Secondary hyperparathyroidism of renal origin: Secondary | ICD-10-CM | POA: Diagnosis not present

## 2017-12-06 DIAGNOSIS — N186 End stage renal disease: Secondary | ICD-10-CM | POA: Diagnosis not present

## 2017-12-08 DIAGNOSIS — Z23 Encounter for immunization: Secondary | ICD-10-CM | POA: Diagnosis not present

## 2017-12-08 DIAGNOSIS — N2581 Secondary hyperparathyroidism of renal origin: Secondary | ICD-10-CM | POA: Diagnosis not present

## 2017-12-08 DIAGNOSIS — N186 End stage renal disease: Secondary | ICD-10-CM | POA: Diagnosis not present

## 2017-12-08 DIAGNOSIS — D631 Anemia in chronic kidney disease: Secondary | ICD-10-CM | POA: Diagnosis not present

## 2017-12-08 DIAGNOSIS — D509 Iron deficiency anemia, unspecified: Secondary | ICD-10-CM | POA: Diagnosis not present

## 2017-12-11 DIAGNOSIS — Z992 Dependence on renal dialysis: Secondary | ICD-10-CM | POA: Diagnosis not present

## 2017-12-11 DIAGNOSIS — Z23 Encounter for immunization: Secondary | ICD-10-CM | POA: Diagnosis not present

## 2017-12-11 DIAGNOSIS — I129 Hypertensive chronic kidney disease with stage 1 through stage 4 chronic kidney disease, or unspecified chronic kidney disease: Secondary | ICD-10-CM | POA: Diagnosis not present

## 2017-12-11 DIAGNOSIS — D631 Anemia in chronic kidney disease: Secondary | ICD-10-CM | POA: Diagnosis not present

## 2017-12-11 DIAGNOSIS — N186 End stage renal disease: Secondary | ICD-10-CM | POA: Diagnosis not present

## 2017-12-11 DIAGNOSIS — N2581 Secondary hyperparathyroidism of renal origin: Secondary | ICD-10-CM | POA: Diagnosis not present

## 2017-12-11 DIAGNOSIS — D509 Iron deficiency anemia, unspecified: Secondary | ICD-10-CM | POA: Diagnosis not present

## 2017-12-13 DIAGNOSIS — N186 End stage renal disease: Secondary | ICD-10-CM | POA: Diagnosis not present

## 2017-12-13 DIAGNOSIS — N2581 Secondary hyperparathyroidism of renal origin: Secondary | ICD-10-CM | POA: Diagnosis not present

## 2017-12-15 DIAGNOSIS — N2581 Secondary hyperparathyroidism of renal origin: Secondary | ICD-10-CM | POA: Diagnosis not present

## 2017-12-15 DIAGNOSIS — N186 End stage renal disease: Secondary | ICD-10-CM | POA: Diagnosis not present

## 2017-12-18 DIAGNOSIS — N186 End stage renal disease: Secondary | ICD-10-CM | POA: Diagnosis not present

## 2017-12-18 DIAGNOSIS — N2581 Secondary hyperparathyroidism of renal origin: Secondary | ICD-10-CM | POA: Diagnosis not present

## 2017-12-20 DIAGNOSIS — N2581 Secondary hyperparathyroidism of renal origin: Secondary | ICD-10-CM | POA: Diagnosis not present

## 2017-12-20 DIAGNOSIS — N186 End stage renal disease: Secondary | ICD-10-CM | POA: Diagnosis not present

## 2017-12-22 DIAGNOSIS — N186 End stage renal disease: Secondary | ICD-10-CM | POA: Diagnosis not present

## 2017-12-22 DIAGNOSIS — N2581 Secondary hyperparathyroidism of renal origin: Secondary | ICD-10-CM | POA: Diagnosis not present

## 2017-12-25 DIAGNOSIS — N186 End stage renal disease: Secondary | ICD-10-CM | POA: Diagnosis not present

## 2017-12-25 DIAGNOSIS — N2581 Secondary hyperparathyroidism of renal origin: Secondary | ICD-10-CM | POA: Diagnosis not present

## 2017-12-27 DIAGNOSIS — N2581 Secondary hyperparathyroidism of renal origin: Secondary | ICD-10-CM | POA: Diagnosis not present

## 2017-12-27 DIAGNOSIS — N186 End stage renal disease: Secondary | ICD-10-CM | POA: Diagnosis not present

## 2017-12-29 DIAGNOSIS — N2581 Secondary hyperparathyroidism of renal origin: Secondary | ICD-10-CM | POA: Diagnosis not present

## 2017-12-29 DIAGNOSIS — N186 End stage renal disease: Secondary | ICD-10-CM | POA: Diagnosis not present

## 2018-01-01 DIAGNOSIS — N2581 Secondary hyperparathyroidism of renal origin: Secondary | ICD-10-CM | POA: Diagnosis not present

## 2018-01-01 DIAGNOSIS — N186 End stage renal disease: Secondary | ICD-10-CM | POA: Diagnosis not present

## 2018-01-03 DIAGNOSIS — N186 End stage renal disease: Secondary | ICD-10-CM | POA: Diagnosis not present

## 2018-01-03 DIAGNOSIS — N2581 Secondary hyperparathyroidism of renal origin: Secondary | ICD-10-CM | POA: Diagnosis not present

## 2018-01-05 DIAGNOSIS — N2581 Secondary hyperparathyroidism of renal origin: Secondary | ICD-10-CM | POA: Diagnosis not present

## 2018-01-05 DIAGNOSIS — N186 End stage renal disease: Secondary | ICD-10-CM | POA: Diagnosis not present

## 2018-01-08 DIAGNOSIS — N2581 Secondary hyperparathyroidism of renal origin: Secondary | ICD-10-CM | POA: Diagnosis not present

## 2018-01-08 DIAGNOSIS — N186 End stage renal disease: Secondary | ICD-10-CM | POA: Diagnosis not present

## 2018-01-10 DIAGNOSIS — N2581 Secondary hyperparathyroidism of renal origin: Secondary | ICD-10-CM | POA: Diagnosis not present

## 2018-01-10 DIAGNOSIS — N186 End stage renal disease: Secondary | ICD-10-CM | POA: Diagnosis not present

## 2018-01-11 DIAGNOSIS — N186 End stage renal disease: Secondary | ICD-10-CM | POA: Diagnosis not present

## 2018-01-11 DIAGNOSIS — I129 Hypertensive chronic kidney disease with stage 1 through stage 4 chronic kidney disease, or unspecified chronic kidney disease: Secondary | ICD-10-CM | POA: Diagnosis not present

## 2018-01-11 DIAGNOSIS — Z992 Dependence on renal dialysis: Secondary | ICD-10-CM | POA: Diagnosis not present

## 2018-01-12 DIAGNOSIS — N186 End stage renal disease: Secondary | ICD-10-CM | POA: Diagnosis not present

## 2018-01-12 DIAGNOSIS — I129 Hypertensive chronic kidney disease with stage 1 through stage 4 chronic kidney disease, or unspecified chronic kidney disease: Secondary | ICD-10-CM | POA: Diagnosis not present

## 2018-01-12 DIAGNOSIS — Z992 Dependence on renal dialysis: Secondary | ICD-10-CM | POA: Diagnosis not present

## 2018-01-12 DIAGNOSIS — D631 Anemia in chronic kidney disease: Secondary | ICD-10-CM | POA: Diagnosis not present

## 2018-01-12 DIAGNOSIS — N2581 Secondary hyperparathyroidism of renal origin: Secondary | ICD-10-CM | POA: Diagnosis not present

## 2018-01-12 DIAGNOSIS — Z23 Encounter for immunization: Secondary | ICD-10-CM | POA: Diagnosis not present

## 2018-01-15 DIAGNOSIS — Z23 Encounter for immunization: Secondary | ICD-10-CM | POA: Diagnosis not present

## 2018-01-15 DIAGNOSIS — N186 End stage renal disease: Secondary | ICD-10-CM | POA: Diagnosis not present

## 2018-01-15 DIAGNOSIS — D631 Anemia in chronic kidney disease: Secondary | ICD-10-CM | POA: Diagnosis not present

## 2018-01-15 DIAGNOSIS — N2581 Secondary hyperparathyroidism of renal origin: Secondary | ICD-10-CM | POA: Diagnosis not present

## 2018-01-17 DIAGNOSIS — N2581 Secondary hyperparathyroidism of renal origin: Secondary | ICD-10-CM | POA: Diagnosis not present

## 2018-01-17 DIAGNOSIS — Z23 Encounter for immunization: Secondary | ICD-10-CM | POA: Diagnosis not present

## 2018-01-17 DIAGNOSIS — D631 Anemia in chronic kidney disease: Secondary | ICD-10-CM | POA: Diagnosis not present

## 2018-01-17 DIAGNOSIS — N186 End stage renal disease: Secondary | ICD-10-CM | POA: Diagnosis not present

## 2018-01-19 DIAGNOSIS — Z23 Encounter for immunization: Secondary | ICD-10-CM | POA: Diagnosis not present

## 2018-01-19 DIAGNOSIS — N186 End stage renal disease: Secondary | ICD-10-CM | POA: Diagnosis not present

## 2018-01-19 DIAGNOSIS — N2581 Secondary hyperparathyroidism of renal origin: Secondary | ICD-10-CM | POA: Diagnosis not present

## 2018-01-19 DIAGNOSIS — D631 Anemia in chronic kidney disease: Secondary | ICD-10-CM | POA: Diagnosis not present

## 2018-01-22 DIAGNOSIS — N186 End stage renal disease: Secondary | ICD-10-CM | POA: Diagnosis not present

## 2018-01-22 DIAGNOSIS — Z23 Encounter for immunization: Secondary | ICD-10-CM | POA: Diagnosis not present

## 2018-01-22 DIAGNOSIS — N2581 Secondary hyperparathyroidism of renal origin: Secondary | ICD-10-CM | POA: Diagnosis not present

## 2018-01-22 DIAGNOSIS — D631 Anemia in chronic kidney disease: Secondary | ICD-10-CM | POA: Diagnosis not present

## 2018-01-24 DIAGNOSIS — D631 Anemia in chronic kidney disease: Secondary | ICD-10-CM | POA: Diagnosis not present

## 2018-01-24 DIAGNOSIS — N186 End stage renal disease: Secondary | ICD-10-CM | POA: Diagnosis not present

## 2018-01-24 DIAGNOSIS — Z23 Encounter for immunization: Secondary | ICD-10-CM | POA: Diagnosis not present

## 2018-01-24 DIAGNOSIS — N2581 Secondary hyperparathyroidism of renal origin: Secondary | ICD-10-CM | POA: Diagnosis not present

## 2018-01-26 ENCOUNTER — Encounter: Payer: BC Managed Care – PPO | Admitting: Family Medicine

## 2018-01-26 DIAGNOSIS — D631 Anemia in chronic kidney disease: Secondary | ICD-10-CM | POA: Diagnosis not present

## 2018-01-26 DIAGNOSIS — Z0289 Encounter for other administrative examinations: Secondary | ICD-10-CM

## 2018-01-26 DIAGNOSIS — N2581 Secondary hyperparathyroidism of renal origin: Secondary | ICD-10-CM | POA: Diagnosis not present

## 2018-01-26 DIAGNOSIS — N186 End stage renal disease: Secondary | ICD-10-CM | POA: Diagnosis not present

## 2018-01-26 DIAGNOSIS — Z23 Encounter for immunization: Secondary | ICD-10-CM | POA: Diagnosis not present

## 2018-01-29 DIAGNOSIS — D631 Anemia in chronic kidney disease: Secondary | ICD-10-CM | POA: Diagnosis not present

## 2018-01-29 DIAGNOSIS — Z23 Encounter for immunization: Secondary | ICD-10-CM | POA: Diagnosis not present

## 2018-01-29 DIAGNOSIS — N186 End stage renal disease: Secondary | ICD-10-CM | POA: Diagnosis not present

## 2018-01-29 DIAGNOSIS — N2581 Secondary hyperparathyroidism of renal origin: Secondary | ICD-10-CM | POA: Diagnosis not present

## 2018-01-31 DIAGNOSIS — Z23 Encounter for immunization: Secondary | ICD-10-CM | POA: Diagnosis not present

## 2018-01-31 DIAGNOSIS — D631 Anemia in chronic kidney disease: Secondary | ICD-10-CM | POA: Diagnosis not present

## 2018-01-31 DIAGNOSIS — N186 End stage renal disease: Secondary | ICD-10-CM | POA: Diagnosis not present

## 2018-01-31 DIAGNOSIS — N2581 Secondary hyperparathyroidism of renal origin: Secondary | ICD-10-CM | POA: Diagnosis not present

## 2018-02-02 DIAGNOSIS — D631 Anemia in chronic kidney disease: Secondary | ICD-10-CM | POA: Diagnosis not present

## 2018-02-02 DIAGNOSIS — N2581 Secondary hyperparathyroidism of renal origin: Secondary | ICD-10-CM | POA: Diagnosis not present

## 2018-02-02 DIAGNOSIS — Z23 Encounter for immunization: Secondary | ICD-10-CM | POA: Diagnosis not present

## 2018-02-02 DIAGNOSIS — N186 End stage renal disease: Secondary | ICD-10-CM | POA: Diagnosis not present

## 2018-02-04 DIAGNOSIS — N186 End stage renal disease: Secondary | ICD-10-CM | POA: Diagnosis not present

## 2018-02-04 DIAGNOSIS — D631 Anemia in chronic kidney disease: Secondary | ICD-10-CM | POA: Diagnosis not present

## 2018-02-04 DIAGNOSIS — N2581 Secondary hyperparathyroidism of renal origin: Secondary | ICD-10-CM | POA: Diagnosis not present

## 2018-02-04 DIAGNOSIS — Z23 Encounter for immunization: Secondary | ICD-10-CM | POA: Diagnosis not present

## 2018-02-06 DIAGNOSIS — Z23 Encounter for immunization: Secondary | ICD-10-CM | POA: Diagnosis not present

## 2018-02-06 DIAGNOSIS — D631 Anemia in chronic kidney disease: Secondary | ICD-10-CM | POA: Diagnosis not present

## 2018-02-06 DIAGNOSIS — N2581 Secondary hyperparathyroidism of renal origin: Secondary | ICD-10-CM | POA: Diagnosis not present

## 2018-02-06 DIAGNOSIS — N186 End stage renal disease: Secondary | ICD-10-CM | POA: Diagnosis not present

## 2018-02-09 DIAGNOSIS — Z23 Encounter for immunization: Secondary | ICD-10-CM | POA: Diagnosis not present

## 2018-02-09 DIAGNOSIS — N2581 Secondary hyperparathyroidism of renal origin: Secondary | ICD-10-CM | POA: Diagnosis not present

## 2018-02-09 DIAGNOSIS — D631 Anemia in chronic kidney disease: Secondary | ICD-10-CM | POA: Diagnosis not present

## 2018-02-09 DIAGNOSIS — N186 End stage renal disease: Secondary | ICD-10-CM | POA: Diagnosis not present

## 2018-02-11 DIAGNOSIS — I129 Hypertensive chronic kidney disease with stage 1 through stage 4 chronic kidney disease, or unspecified chronic kidney disease: Secondary | ICD-10-CM | POA: Diagnosis not present

## 2018-02-11 DIAGNOSIS — N186 End stage renal disease: Secondary | ICD-10-CM | POA: Diagnosis not present

## 2018-02-11 DIAGNOSIS — Z992 Dependence on renal dialysis: Secondary | ICD-10-CM | POA: Diagnosis not present

## 2018-02-12 DIAGNOSIS — D631 Anemia in chronic kidney disease: Secondary | ICD-10-CM | POA: Diagnosis not present

## 2018-02-12 DIAGNOSIS — N2581 Secondary hyperparathyroidism of renal origin: Secondary | ICD-10-CM | POA: Diagnosis not present

## 2018-02-12 DIAGNOSIS — Z23 Encounter for immunization: Secondary | ICD-10-CM | POA: Diagnosis not present

## 2018-02-12 DIAGNOSIS — N186 End stage renal disease: Secondary | ICD-10-CM | POA: Diagnosis not present

## 2018-02-14 DIAGNOSIS — Z23 Encounter for immunization: Secondary | ICD-10-CM | POA: Diagnosis not present

## 2018-02-14 DIAGNOSIS — N186 End stage renal disease: Secondary | ICD-10-CM | POA: Diagnosis not present

## 2018-02-14 DIAGNOSIS — N2581 Secondary hyperparathyroidism of renal origin: Secondary | ICD-10-CM | POA: Diagnosis not present

## 2018-02-14 DIAGNOSIS — D631 Anemia in chronic kidney disease: Secondary | ICD-10-CM | POA: Diagnosis not present

## 2018-02-16 DIAGNOSIS — D631 Anemia in chronic kidney disease: Secondary | ICD-10-CM | POA: Diagnosis not present

## 2018-02-16 DIAGNOSIS — N2581 Secondary hyperparathyroidism of renal origin: Secondary | ICD-10-CM | POA: Diagnosis not present

## 2018-02-16 DIAGNOSIS — N186 End stage renal disease: Secondary | ICD-10-CM | POA: Diagnosis not present

## 2018-02-16 DIAGNOSIS — Z23 Encounter for immunization: Secondary | ICD-10-CM | POA: Diagnosis not present

## 2018-02-19 DIAGNOSIS — Z23 Encounter for immunization: Secondary | ICD-10-CM | POA: Diagnosis not present

## 2018-02-19 DIAGNOSIS — N186 End stage renal disease: Secondary | ICD-10-CM | POA: Diagnosis not present

## 2018-02-19 DIAGNOSIS — N2581 Secondary hyperparathyroidism of renal origin: Secondary | ICD-10-CM | POA: Diagnosis not present

## 2018-02-19 DIAGNOSIS — D631 Anemia in chronic kidney disease: Secondary | ICD-10-CM | POA: Diagnosis not present

## 2018-02-21 DIAGNOSIS — N2581 Secondary hyperparathyroidism of renal origin: Secondary | ICD-10-CM | POA: Diagnosis not present

## 2018-02-21 DIAGNOSIS — D631 Anemia in chronic kidney disease: Secondary | ICD-10-CM | POA: Diagnosis not present

## 2018-02-21 DIAGNOSIS — Z23 Encounter for immunization: Secondary | ICD-10-CM | POA: Diagnosis not present

## 2018-02-21 DIAGNOSIS — N186 End stage renal disease: Secondary | ICD-10-CM | POA: Diagnosis not present

## 2018-02-23 DIAGNOSIS — Z23 Encounter for immunization: Secondary | ICD-10-CM | POA: Diagnosis not present

## 2018-02-23 DIAGNOSIS — N2581 Secondary hyperparathyroidism of renal origin: Secondary | ICD-10-CM | POA: Diagnosis not present

## 2018-02-23 DIAGNOSIS — D631 Anemia in chronic kidney disease: Secondary | ICD-10-CM | POA: Diagnosis not present

## 2018-02-23 DIAGNOSIS — N186 End stage renal disease: Secondary | ICD-10-CM | POA: Diagnosis not present

## 2018-02-27 DIAGNOSIS — N186 End stage renal disease: Secondary | ICD-10-CM | POA: Diagnosis not present

## 2018-02-27 DIAGNOSIS — Z23 Encounter for immunization: Secondary | ICD-10-CM | POA: Diagnosis not present

## 2018-02-27 DIAGNOSIS — D631 Anemia in chronic kidney disease: Secondary | ICD-10-CM | POA: Diagnosis not present

## 2018-02-27 DIAGNOSIS — N2581 Secondary hyperparathyroidism of renal origin: Secondary | ICD-10-CM | POA: Diagnosis not present

## 2018-02-28 DIAGNOSIS — N186 End stage renal disease: Secondary | ICD-10-CM | POA: Diagnosis not present

## 2018-02-28 DIAGNOSIS — N2581 Secondary hyperparathyroidism of renal origin: Secondary | ICD-10-CM | POA: Diagnosis not present

## 2018-02-28 DIAGNOSIS — D631 Anemia in chronic kidney disease: Secondary | ICD-10-CM | POA: Diagnosis not present

## 2018-02-28 DIAGNOSIS — Z23 Encounter for immunization: Secondary | ICD-10-CM | POA: Diagnosis not present

## 2018-03-02 DIAGNOSIS — Z23 Encounter for immunization: Secondary | ICD-10-CM | POA: Diagnosis not present

## 2018-03-02 DIAGNOSIS — N186 End stage renal disease: Secondary | ICD-10-CM | POA: Diagnosis not present

## 2018-03-02 DIAGNOSIS — N2581 Secondary hyperparathyroidism of renal origin: Secondary | ICD-10-CM | POA: Diagnosis not present

## 2018-03-02 DIAGNOSIS — D631 Anemia in chronic kidney disease: Secondary | ICD-10-CM | POA: Diagnosis not present

## 2018-03-04 DIAGNOSIS — D631 Anemia in chronic kidney disease: Secondary | ICD-10-CM | POA: Diagnosis not present

## 2018-03-04 DIAGNOSIS — N186 End stage renal disease: Secondary | ICD-10-CM | POA: Diagnosis not present

## 2018-03-04 DIAGNOSIS — Z23 Encounter for immunization: Secondary | ICD-10-CM | POA: Diagnosis not present

## 2018-03-04 DIAGNOSIS — N2581 Secondary hyperparathyroidism of renal origin: Secondary | ICD-10-CM | POA: Diagnosis not present

## 2018-03-06 DIAGNOSIS — D631 Anemia in chronic kidney disease: Secondary | ICD-10-CM | POA: Diagnosis not present

## 2018-03-06 DIAGNOSIS — Z23 Encounter for immunization: Secondary | ICD-10-CM | POA: Diagnosis not present

## 2018-03-06 DIAGNOSIS — N186 End stage renal disease: Secondary | ICD-10-CM | POA: Diagnosis not present

## 2018-03-06 DIAGNOSIS — N2581 Secondary hyperparathyroidism of renal origin: Secondary | ICD-10-CM | POA: Diagnosis not present

## 2018-03-09 DIAGNOSIS — Z23 Encounter for immunization: Secondary | ICD-10-CM | POA: Diagnosis not present

## 2018-03-09 DIAGNOSIS — N2581 Secondary hyperparathyroidism of renal origin: Secondary | ICD-10-CM | POA: Diagnosis not present

## 2018-03-09 DIAGNOSIS — N186 End stage renal disease: Secondary | ICD-10-CM | POA: Diagnosis not present

## 2018-03-09 DIAGNOSIS — D631 Anemia in chronic kidney disease: Secondary | ICD-10-CM | POA: Diagnosis not present

## 2018-03-11 DIAGNOSIS — Z23 Encounter for immunization: Secondary | ICD-10-CM | POA: Diagnosis not present

## 2018-03-11 DIAGNOSIS — D631 Anemia in chronic kidney disease: Secondary | ICD-10-CM | POA: Diagnosis not present

## 2018-03-11 DIAGNOSIS — N186 End stage renal disease: Secondary | ICD-10-CM | POA: Diagnosis not present

## 2018-03-11 DIAGNOSIS — N2581 Secondary hyperparathyroidism of renal origin: Secondary | ICD-10-CM | POA: Diagnosis not present

## 2018-03-13 DIAGNOSIS — N186 End stage renal disease: Secondary | ICD-10-CM | POA: Diagnosis not present

## 2018-03-13 DIAGNOSIS — D631 Anemia in chronic kidney disease: Secondary | ICD-10-CM | POA: Diagnosis not present

## 2018-03-13 DIAGNOSIS — N2581 Secondary hyperparathyroidism of renal origin: Secondary | ICD-10-CM | POA: Diagnosis not present

## 2018-03-13 DIAGNOSIS — Z23 Encounter for immunization: Secondary | ICD-10-CM | POA: Diagnosis not present

## 2018-03-14 DIAGNOSIS — I129 Hypertensive chronic kidney disease with stage 1 through stage 4 chronic kidney disease, or unspecified chronic kidney disease: Secondary | ICD-10-CM | POA: Diagnosis not present

## 2018-03-14 DIAGNOSIS — N186 End stage renal disease: Secondary | ICD-10-CM | POA: Diagnosis not present

## 2018-03-14 DIAGNOSIS — Z992 Dependence on renal dialysis: Secondary | ICD-10-CM | POA: Diagnosis not present

## 2018-03-16 DIAGNOSIS — N186 End stage renal disease: Secondary | ICD-10-CM | POA: Diagnosis not present

## 2018-03-16 DIAGNOSIS — N2581 Secondary hyperparathyroidism of renal origin: Secondary | ICD-10-CM | POA: Diagnosis not present

## 2018-03-16 DIAGNOSIS — D631 Anemia in chronic kidney disease: Secondary | ICD-10-CM | POA: Diagnosis not present

## 2018-03-16 DIAGNOSIS — Z23 Encounter for immunization: Secondary | ICD-10-CM | POA: Diagnosis not present

## 2018-03-16 DIAGNOSIS — D509 Iron deficiency anemia, unspecified: Secondary | ICD-10-CM | POA: Diagnosis not present

## 2018-03-19 DIAGNOSIS — N2581 Secondary hyperparathyroidism of renal origin: Secondary | ICD-10-CM | POA: Diagnosis not present

## 2018-03-19 DIAGNOSIS — D509 Iron deficiency anemia, unspecified: Secondary | ICD-10-CM | POA: Diagnosis not present

## 2018-03-19 DIAGNOSIS — Z23 Encounter for immunization: Secondary | ICD-10-CM | POA: Diagnosis not present

## 2018-03-19 DIAGNOSIS — D631 Anemia in chronic kidney disease: Secondary | ICD-10-CM | POA: Diagnosis not present

## 2018-03-19 DIAGNOSIS — N186 End stage renal disease: Secondary | ICD-10-CM | POA: Diagnosis not present

## 2018-03-21 DIAGNOSIS — Z23 Encounter for immunization: Secondary | ICD-10-CM | POA: Diagnosis not present

## 2018-03-21 DIAGNOSIS — N2581 Secondary hyperparathyroidism of renal origin: Secondary | ICD-10-CM | POA: Diagnosis not present

## 2018-03-21 DIAGNOSIS — D509 Iron deficiency anemia, unspecified: Secondary | ICD-10-CM | POA: Diagnosis not present

## 2018-03-21 DIAGNOSIS — D631 Anemia in chronic kidney disease: Secondary | ICD-10-CM | POA: Diagnosis not present

## 2018-03-21 DIAGNOSIS — N186 End stage renal disease: Secondary | ICD-10-CM | POA: Diagnosis not present

## 2018-03-23 DIAGNOSIS — D509 Iron deficiency anemia, unspecified: Secondary | ICD-10-CM | POA: Diagnosis not present

## 2018-03-23 DIAGNOSIS — N186 End stage renal disease: Secondary | ICD-10-CM | POA: Diagnosis not present

## 2018-03-23 DIAGNOSIS — Z23 Encounter for immunization: Secondary | ICD-10-CM | POA: Diagnosis not present

## 2018-03-23 DIAGNOSIS — D631 Anemia in chronic kidney disease: Secondary | ICD-10-CM | POA: Diagnosis not present

## 2018-03-23 DIAGNOSIS — N2581 Secondary hyperparathyroidism of renal origin: Secondary | ICD-10-CM | POA: Diagnosis not present

## 2018-03-26 DIAGNOSIS — D509 Iron deficiency anemia, unspecified: Secondary | ICD-10-CM | POA: Diagnosis not present

## 2018-03-26 DIAGNOSIS — Z23 Encounter for immunization: Secondary | ICD-10-CM | POA: Diagnosis not present

## 2018-03-26 DIAGNOSIS — N186 End stage renal disease: Secondary | ICD-10-CM | POA: Diagnosis not present

## 2018-03-26 DIAGNOSIS — N2581 Secondary hyperparathyroidism of renal origin: Secondary | ICD-10-CM | POA: Diagnosis not present

## 2018-03-26 DIAGNOSIS — D631 Anemia in chronic kidney disease: Secondary | ICD-10-CM | POA: Diagnosis not present

## 2018-03-29 DIAGNOSIS — N186 End stage renal disease: Secondary | ICD-10-CM | POA: Diagnosis not present

## 2018-03-29 DIAGNOSIS — N2581 Secondary hyperparathyroidism of renal origin: Secondary | ICD-10-CM | POA: Diagnosis not present

## 2018-03-29 DIAGNOSIS — D631 Anemia in chronic kidney disease: Secondary | ICD-10-CM | POA: Diagnosis not present

## 2018-03-29 DIAGNOSIS — Z23 Encounter for immunization: Secondary | ICD-10-CM | POA: Diagnosis not present

## 2018-03-29 DIAGNOSIS — D509 Iron deficiency anemia, unspecified: Secondary | ICD-10-CM | POA: Diagnosis not present

## 2018-03-30 DIAGNOSIS — D631 Anemia in chronic kidney disease: Secondary | ICD-10-CM | POA: Diagnosis not present

## 2018-03-30 DIAGNOSIS — N186 End stage renal disease: Secondary | ICD-10-CM | POA: Diagnosis not present

## 2018-03-30 DIAGNOSIS — D509 Iron deficiency anemia, unspecified: Secondary | ICD-10-CM | POA: Diagnosis not present

## 2018-03-30 DIAGNOSIS — Z23 Encounter for immunization: Secondary | ICD-10-CM | POA: Diagnosis not present

## 2018-03-30 DIAGNOSIS — N2581 Secondary hyperparathyroidism of renal origin: Secondary | ICD-10-CM | POA: Diagnosis not present

## 2018-04-02 DIAGNOSIS — N2581 Secondary hyperparathyroidism of renal origin: Secondary | ICD-10-CM | POA: Diagnosis not present

## 2018-04-02 DIAGNOSIS — Z23 Encounter for immunization: Secondary | ICD-10-CM | POA: Diagnosis not present

## 2018-04-02 DIAGNOSIS — N186 End stage renal disease: Secondary | ICD-10-CM | POA: Diagnosis not present

## 2018-04-02 DIAGNOSIS — D509 Iron deficiency anemia, unspecified: Secondary | ICD-10-CM | POA: Diagnosis not present

## 2018-04-02 DIAGNOSIS — D631 Anemia in chronic kidney disease: Secondary | ICD-10-CM | POA: Diagnosis not present

## 2018-04-04 DIAGNOSIS — D631 Anemia in chronic kidney disease: Secondary | ICD-10-CM | POA: Diagnosis not present

## 2018-04-04 DIAGNOSIS — D509 Iron deficiency anemia, unspecified: Secondary | ICD-10-CM | POA: Diagnosis not present

## 2018-04-04 DIAGNOSIS — Z23 Encounter for immunization: Secondary | ICD-10-CM | POA: Diagnosis not present

## 2018-04-04 DIAGNOSIS — N186 End stage renal disease: Secondary | ICD-10-CM | POA: Diagnosis not present

## 2018-04-04 DIAGNOSIS — N2581 Secondary hyperparathyroidism of renal origin: Secondary | ICD-10-CM | POA: Diagnosis not present

## 2018-04-06 DIAGNOSIS — Z23 Encounter for immunization: Secondary | ICD-10-CM | POA: Diagnosis not present

## 2018-04-06 DIAGNOSIS — D631 Anemia in chronic kidney disease: Secondary | ICD-10-CM | POA: Diagnosis not present

## 2018-04-06 DIAGNOSIS — N2581 Secondary hyperparathyroidism of renal origin: Secondary | ICD-10-CM | POA: Diagnosis not present

## 2018-04-06 DIAGNOSIS — D509 Iron deficiency anemia, unspecified: Secondary | ICD-10-CM | POA: Diagnosis not present

## 2018-04-06 DIAGNOSIS — N186 End stage renal disease: Secondary | ICD-10-CM | POA: Diagnosis not present

## 2018-04-09 DIAGNOSIS — D631 Anemia in chronic kidney disease: Secondary | ICD-10-CM | POA: Diagnosis not present

## 2018-04-09 DIAGNOSIS — N2581 Secondary hyperparathyroidism of renal origin: Secondary | ICD-10-CM | POA: Diagnosis not present

## 2018-04-09 DIAGNOSIS — N186 End stage renal disease: Secondary | ICD-10-CM | POA: Diagnosis not present

## 2018-04-09 DIAGNOSIS — D509 Iron deficiency anemia, unspecified: Secondary | ICD-10-CM | POA: Diagnosis not present

## 2018-04-09 DIAGNOSIS — Z23 Encounter for immunization: Secondary | ICD-10-CM | POA: Diagnosis not present

## 2018-04-11 DIAGNOSIS — D509 Iron deficiency anemia, unspecified: Secondary | ICD-10-CM | POA: Diagnosis not present

## 2018-04-11 DIAGNOSIS — Z23 Encounter for immunization: Secondary | ICD-10-CM | POA: Diagnosis not present

## 2018-04-11 DIAGNOSIS — N186 End stage renal disease: Secondary | ICD-10-CM | POA: Diagnosis not present

## 2018-04-11 DIAGNOSIS — D631 Anemia in chronic kidney disease: Secondary | ICD-10-CM | POA: Diagnosis not present

## 2018-04-11 DIAGNOSIS — N2581 Secondary hyperparathyroidism of renal origin: Secondary | ICD-10-CM | POA: Diagnosis not present

## 2018-04-13 DIAGNOSIS — Z23 Encounter for immunization: Secondary | ICD-10-CM | POA: Diagnosis not present

## 2018-04-13 DIAGNOSIS — N2581 Secondary hyperparathyroidism of renal origin: Secondary | ICD-10-CM | POA: Diagnosis not present

## 2018-04-13 DIAGNOSIS — D631 Anemia in chronic kidney disease: Secondary | ICD-10-CM | POA: Diagnosis not present

## 2018-04-13 DIAGNOSIS — D509 Iron deficiency anemia, unspecified: Secondary | ICD-10-CM | POA: Diagnosis not present

## 2018-04-13 DIAGNOSIS — N186 End stage renal disease: Secondary | ICD-10-CM | POA: Diagnosis not present

## 2018-04-14 DIAGNOSIS — N186 End stage renal disease: Secondary | ICD-10-CM | POA: Diagnosis not present

## 2018-04-14 DIAGNOSIS — I129 Hypertensive chronic kidney disease with stage 1 through stage 4 chronic kidney disease, or unspecified chronic kidney disease: Secondary | ICD-10-CM | POA: Diagnosis not present

## 2018-04-14 DIAGNOSIS — Z992 Dependence on renal dialysis: Secondary | ICD-10-CM | POA: Diagnosis not present

## 2018-04-16 DIAGNOSIS — D509 Iron deficiency anemia, unspecified: Secondary | ICD-10-CM | POA: Diagnosis not present

## 2018-04-16 DIAGNOSIS — N2581 Secondary hyperparathyroidism of renal origin: Secondary | ICD-10-CM | POA: Diagnosis not present

## 2018-04-16 DIAGNOSIS — N186 End stage renal disease: Secondary | ICD-10-CM | POA: Diagnosis not present

## 2018-04-16 DIAGNOSIS — D631 Anemia in chronic kidney disease: Secondary | ICD-10-CM | POA: Diagnosis not present

## 2018-04-18 DIAGNOSIS — N186 End stage renal disease: Secondary | ICD-10-CM | POA: Diagnosis not present

## 2018-04-18 DIAGNOSIS — D509 Iron deficiency anemia, unspecified: Secondary | ICD-10-CM | POA: Diagnosis not present

## 2018-04-18 DIAGNOSIS — N2581 Secondary hyperparathyroidism of renal origin: Secondary | ICD-10-CM | POA: Diagnosis not present

## 2018-04-18 DIAGNOSIS — D631 Anemia in chronic kidney disease: Secondary | ICD-10-CM | POA: Diagnosis not present

## 2018-04-20 DIAGNOSIS — N2581 Secondary hyperparathyroidism of renal origin: Secondary | ICD-10-CM | POA: Diagnosis not present

## 2018-04-20 DIAGNOSIS — D631 Anemia in chronic kidney disease: Secondary | ICD-10-CM | POA: Diagnosis not present

## 2018-04-20 DIAGNOSIS — N186 End stage renal disease: Secondary | ICD-10-CM | POA: Diagnosis not present

## 2018-04-20 DIAGNOSIS — D509 Iron deficiency anemia, unspecified: Secondary | ICD-10-CM | POA: Diagnosis not present

## 2018-04-23 DIAGNOSIS — D631 Anemia in chronic kidney disease: Secondary | ICD-10-CM | POA: Diagnosis not present

## 2018-04-23 DIAGNOSIS — N2581 Secondary hyperparathyroidism of renal origin: Secondary | ICD-10-CM | POA: Diagnosis not present

## 2018-04-23 DIAGNOSIS — N186 End stage renal disease: Secondary | ICD-10-CM | POA: Diagnosis not present

## 2018-04-23 DIAGNOSIS — D509 Iron deficiency anemia, unspecified: Secondary | ICD-10-CM | POA: Diagnosis not present

## 2018-04-25 DIAGNOSIS — N2581 Secondary hyperparathyroidism of renal origin: Secondary | ICD-10-CM | POA: Diagnosis not present

## 2018-04-25 DIAGNOSIS — D631 Anemia in chronic kidney disease: Secondary | ICD-10-CM | POA: Diagnosis not present

## 2018-04-25 DIAGNOSIS — D509 Iron deficiency anemia, unspecified: Secondary | ICD-10-CM | POA: Diagnosis not present

## 2018-04-25 DIAGNOSIS — N186 End stage renal disease: Secondary | ICD-10-CM | POA: Diagnosis not present

## 2018-04-27 DIAGNOSIS — N2581 Secondary hyperparathyroidism of renal origin: Secondary | ICD-10-CM | POA: Diagnosis not present

## 2018-04-27 DIAGNOSIS — D631 Anemia in chronic kidney disease: Secondary | ICD-10-CM | POA: Diagnosis not present

## 2018-04-27 DIAGNOSIS — N186 End stage renal disease: Secondary | ICD-10-CM | POA: Diagnosis not present

## 2018-04-27 DIAGNOSIS — D509 Iron deficiency anemia, unspecified: Secondary | ICD-10-CM | POA: Diagnosis not present

## 2018-04-30 DIAGNOSIS — D509 Iron deficiency anemia, unspecified: Secondary | ICD-10-CM | POA: Diagnosis not present

## 2018-04-30 DIAGNOSIS — N2581 Secondary hyperparathyroidism of renal origin: Secondary | ICD-10-CM | POA: Diagnosis not present

## 2018-04-30 DIAGNOSIS — N186 End stage renal disease: Secondary | ICD-10-CM | POA: Diagnosis not present

## 2018-04-30 DIAGNOSIS — D631 Anemia in chronic kidney disease: Secondary | ICD-10-CM | POA: Diagnosis not present

## 2018-05-02 DIAGNOSIS — N2581 Secondary hyperparathyroidism of renal origin: Secondary | ICD-10-CM | POA: Diagnosis not present

## 2018-05-02 DIAGNOSIS — D509 Iron deficiency anemia, unspecified: Secondary | ICD-10-CM | POA: Diagnosis not present

## 2018-05-02 DIAGNOSIS — N186 End stage renal disease: Secondary | ICD-10-CM | POA: Diagnosis not present

## 2018-05-02 DIAGNOSIS — D631 Anemia in chronic kidney disease: Secondary | ICD-10-CM | POA: Diagnosis not present

## 2018-05-04 DIAGNOSIS — D631 Anemia in chronic kidney disease: Secondary | ICD-10-CM | POA: Diagnosis not present

## 2018-05-04 DIAGNOSIS — N2581 Secondary hyperparathyroidism of renal origin: Secondary | ICD-10-CM | POA: Diagnosis not present

## 2018-05-04 DIAGNOSIS — N186 End stage renal disease: Secondary | ICD-10-CM | POA: Diagnosis not present

## 2018-05-04 DIAGNOSIS — D509 Iron deficiency anemia, unspecified: Secondary | ICD-10-CM | POA: Diagnosis not present

## 2018-05-07 DIAGNOSIS — D509 Iron deficiency anemia, unspecified: Secondary | ICD-10-CM | POA: Diagnosis not present

## 2018-05-07 DIAGNOSIS — N2581 Secondary hyperparathyroidism of renal origin: Secondary | ICD-10-CM | POA: Diagnosis not present

## 2018-05-07 DIAGNOSIS — D631 Anemia in chronic kidney disease: Secondary | ICD-10-CM | POA: Diagnosis not present

## 2018-05-07 DIAGNOSIS — N186 End stage renal disease: Secondary | ICD-10-CM | POA: Diagnosis not present

## 2018-05-09 DIAGNOSIS — D631 Anemia in chronic kidney disease: Secondary | ICD-10-CM | POA: Diagnosis not present

## 2018-05-09 DIAGNOSIS — D509 Iron deficiency anemia, unspecified: Secondary | ICD-10-CM | POA: Diagnosis not present

## 2018-05-09 DIAGNOSIS — N2581 Secondary hyperparathyroidism of renal origin: Secondary | ICD-10-CM | POA: Diagnosis not present

## 2018-05-09 DIAGNOSIS — N186 End stage renal disease: Secondary | ICD-10-CM | POA: Diagnosis not present

## 2018-05-11 DIAGNOSIS — D509 Iron deficiency anemia, unspecified: Secondary | ICD-10-CM | POA: Diagnosis not present

## 2018-05-11 DIAGNOSIS — D631 Anemia in chronic kidney disease: Secondary | ICD-10-CM | POA: Diagnosis not present

## 2018-05-11 DIAGNOSIS — N186 End stage renal disease: Secondary | ICD-10-CM | POA: Diagnosis not present

## 2018-05-11 DIAGNOSIS — N2581 Secondary hyperparathyroidism of renal origin: Secondary | ICD-10-CM | POA: Diagnosis not present

## 2018-05-13 DIAGNOSIS — N186 End stage renal disease: Secondary | ICD-10-CM | POA: Diagnosis not present

## 2018-05-13 DIAGNOSIS — I129 Hypertensive chronic kidney disease with stage 1 through stage 4 chronic kidney disease, or unspecified chronic kidney disease: Secondary | ICD-10-CM | POA: Diagnosis not present

## 2018-05-13 DIAGNOSIS — Z992 Dependence on renal dialysis: Secondary | ICD-10-CM | POA: Diagnosis not present

## 2018-05-14 DIAGNOSIS — D509 Iron deficiency anemia, unspecified: Secondary | ICD-10-CM | POA: Diagnosis not present

## 2018-05-14 DIAGNOSIS — N2581 Secondary hyperparathyroidism of renal origin: Secondary | ICD-10-CM | POA: Diagnosis not present

## 2018-05-14 DIAGNOSIS — D631 Anemia in chronic kidney disease: Secondary | ICD-10-CM | POA: Diagnosis not present

## 2018-05-14 DIAGNOSIS — Z23 Encounter for immunization: Secondary | ICD-10-CM | POA: Diagnosis not present

## 2018-05-14 DIAGNOSIS — N186 End stage renal disease: Secondary | ICD-10-CM | POA: Diagnosis not present

## 2018-05-16 DIAGNOSIS — D631 Anemia in chronic kidney disease: Secondary | ICD-10-CM | POA: Diagnosis not present

## 2018-05-16 DIAGNOSIS — D509 Iron deficiency anemia, unspecified: Secondary | ICD-10-CM | POA: Diagnosis not present

## 2018-05-16 DIAGNOSIS — N2581 Secondary hyperparathyroidism of renal origin: Secondary | ICD-10-CM | POA: Diagnosis not present

## 2018-05-16 DIAGNOSIS — Z23 Encounter for immunization: Secondary | ICD-10-CM | POA: Diagnosis not present

## 2018-05-16 DIAGNOSIS — N186 End stage renal disease: Secondary | ICD-10-CM | POA: Diagnosis not present

## 2018-05-18 DIAGNOSIS — N2581 Secondary hyperparathyroidism of renal origin: Secondary | ICD-10-CM | POA: Diagnosis not present

## 2018-05-18 DIAGNOSIS — D631 Anemia in chronic kidney disease: Secondary | ICD-10-CM | POA: Diagnosis not present

## 2018-05-18 DIAGNOSIS — Z23 Encounter for immunization: Secondary | ICD-10-CM | POA: Diagnosis not present

## 2018-05-18 DIAGNOSIS — N186 End stage renal disease: Secondary | ICD-10-CM | POA: Diagnosis not present

## 2018-05-18 DIAGNOSIS — D509 Iron deficiency anemia, unspecified: Secondary | ICD-10-CM | POA: Diagnosis not present

## 2018-05-21 DIAGNOSIS — D631 Anemia in chronic kidney disease: Secondary | ICD-10-CM | POA: Diagnosis not present

## 2018-05-21 DIAGNOSIS — D509 Iron deficiency anemia, unspecified: Secondary | ICD-10-CM | POA: Diagnosis not present

## 2018-05-21 DIAGNOSIS — Z23 Encounter for immunization: Secondary | ICD-10-CM | POA: Diagnosis not present

## 2018-05-21 DIAGNOSIS — N186 End stage renal disease: Secondary | ICD-10-CM | POA: Diagnosis not present

## 2018-05-21 DIAGNOSIS — N2581 Secondary hyperparathyroidism of renal origin: Secondary | ICD-10-CM | POA: Diagnosis not present

## 2018-05-23 DIAGNOSIS — N2581 Secondary hyperparathyroidism of renal origin: Secondary | ICD-10-CM | POA: Diagnosis not present

## 2018-05-23 DIAGNOSIS — D509 Iron deficiency anemia, unspecified: Secondary | ICD-10-CM | POA: Diagnosis not present

## 2018-05-23 DIAGNOSIS — Z23 Encounter for immunization: Secondary | ICD-10-CM | POA: Diagnosis not present

## 2018-05-23 DIAGNOSIS — D631 Anemia in chronic kidney disease: Secondary | ICD-10-CM | POA: Diagnosis not present

## 2018-05-23 DIAGNOSIS — N186 End stage renal disease: Secondary | ICD-10-CM | POA: Diagnosis not present

## 2018-05-25 DIAGNOSIS — N186 End stage renal disease: Secondary | ICD-10-CM | POA: Diagnosis not present

## 2018-05-25 DIAGNOSIS — N2581 Secondary hyperparathyroidism of renal origin: Secondary | ICD-10-CM | POA: Diagnosis not present

## 2018-05-25 DIAGNOSIS — D509 Iron deficiency anemia, unspecified: Secondary | ICD-10-CM | POA: Diagnosis not present

## 2018-05-25 DIAGNOSIS — Z23 Encounter for immunization: Secondary | ICD-10-CM | POA: Diagnosis not present

## 2018-05-25 DIAGNOSIS — D631 Anemia in chronic kidney disease: Secondary | ICD-10-CM | POA: Diagnosis not present

## 2018-05-28 DIAGNOSIS — N186 End stage renal disease: Secondary | ICD-10-CM | POA: Diagnosis not present

## 2018-05-28 DIAGNOSIS — Z23 Encounter for immunization: Secondary | ICD-10-CM | POA: Diagnosis not present

## 2018-05-28 DIAGNOSIS — D631 Anemia in chronic kidney disease: Secondary | ICD-10-CM | POA: Diagnosis not present

## 2018-05-28 DIAGNOSIS — N2581 Secondary hyperparathyroidism of renal origin: Secondary | ICD-10-CM | POA: Diagnosis not present

## 2018-05-28 DIAGNOSIS — D509 Iron deficiency anemia, unspecified: Secondary | ICD-10-CM | POA: Diagnosis not present

## 2018-05-30 DIAGNOSIS — D509 Iron deficiency anemia, unspecified: Secondary | ICD-10-CM | POA: Diagnosis not present

## 2018-05-30 DIAGNOSIS — D631 Anemia in chronic kidney disease: Secondary | ICD-10-CM | POA: Diagnosis not present

## 2018-05-30 DIAGNOSIS — N186 End stage renal disease: Secondary | ICD-10-CM | POA: Diagnosis not present

## 2018-05-30 DIAGNOSIS — N2581 Secondary hyperparathyroidism of renal origin: Secondary | ICD-10-CM | POA: Diagnosis not present

## 2018-05-30 DIAGNOSIS — Z23 Encounter for immunization: Secondary | ICD-10-CM | POA: Diagnosis not present

## 2018-06-01 DIAGNOSIS — D631 Anemia in chronic kidney disease: Secondary | ICD-10-CM | POA: Diagnosis not present

## 2018-06-01 DIAGNOSIS — N186 End stage renal disease: Secondary | ICD-10-CM | POA: Diagnosis not present

## 2018-06-01 DIAGNOSIS — Z23 Encounter for immunization: Secondary | ICD-10-CM | POA: Diagnosis not present

## 2018-06-01 DIAGNOSIS — N2581 Secondary hyperparathyroidism of renal origin: Secondary | ICD-10-CM | POA: Diagnosis not present

## 2018-06-01 DIAGNOSIS — D509 Iron deficiency anemia, unspecified: Secondary | ICD-10-CM | POA: Diagnosis not present

## 2018-06-04 DIAGNOSIS — N186 End stage renal disease: Secondary | ICD-10-CM | POA: Diagnosis not present

## 2018-06-04 DIAGNOSIS — N2581 Secondary hyperparathyroidism of renal origin: Secondary | ICD-10-CM | POA: Diagnosis not present

## 2018-06-04 DIAGNOSIS — Z23 Encounter for immunization: Secondary | ICD-10-CM | POA: Diagnosis not present

## 2018-06-04 DIAGNOSIS — D631 Anemia in chronic kidney disease: Secondary | ICD-10-CM | POA: Diagnosis not present

## 2018-06-04 DIAGNOSIS — D509 Iron deficiency anemia, unspecified: Secondary | ICD-10-CM | POA: Diagnosis not present

## 2018-06-06 DIAGNOSIS — D509 Iron deficiency anemia, unspecified: Secondary | ICD-10-CM | POA: Diagnosis not present

## 2018-06-06 DIAGNOSIS — D631 Anemia in chronic kidney disease: Secondary | ICD-10-CM | POA: Diagnosis not present

## 2018-06-06 DIAGNOSIS — N2581 Secondary hyperparathyroidism of renal origin: Secondary | ICD-10-CM | POA: Diagnosis not present

## 2018-06-06 DIAGNOSIS — Z23 Encounter for immunization: Secondary | ICD-10-CM | POA: Diagnosis not present

## 2018-06-06 DIAGNOSIS — N186 End stage renal disease: Secondary | ICD-10-CM | POA: Diagnosis not present

## 2018-06-08 DIAGNOSIS — D509 Iron deficiency anemia, unspecified: Secondary | ICD-10-CM | POA: Diagnosis not present

## 2018-06-08 DIAGNOSIS — N186 End stage renal disease: Secondary | ICD-10-CM | POA: Diagnosis not present

## 2018-06-08 DIAGNOSIS — Z23 Encounter for immunization: Secondary | ICD-10-CM | POA: Diagnosis not present

## 2018-06-08 DIAGNOSIS — N2581 Secondary hyperparathyroidism of renal origin: Secondary | ICD-10-CM | POA: Diagnosis not present

## 2018-06-08 DIAGNOSIS — D631 Anemia in chronic kidney disease: Secondary | ICD-10-CM | POA: Diagnosis not present

## 2018-06-11 DIAGNOSIS — Z23 Encounter for immunization: Secondary | ICD-10-CM | POA: Diagnosis not present

## 2018-06-11 DIAGNOSIS — D631 Anemia in chronic kidney disease: Secondary | ICD-10-CM | POA: Diagnosis not present

## 2018-06-11 DIAGNOSIS — N2581 Secondary hyperparathyroidism of renal origin: Secondary | ICD-10-CM | POA: Diagnosis not present

## 2018-06-11 DIAGNOSIS — D509 Iron deficiency anemia, unspecified: Secondary | ICD-10-CM | POA: Diagnosis not present

## 2018-06-11 DIAGNOSIS — N186 End stage renal disease: Secondary | ICD-10-CM | POA: Diagnosis not present

## 2018-06-13 DIAGNOSIS — Z23 Encounter for immunization: Secondary | ICD-10-CM | POA: Diagnosis not present

## 2018-06-13 DIAGNOSIS — Z992 Dependence on renal dialysis: Secondary | ICD-10-CM | POA: Diagnosis not present

## 2018-06-13 DIAGNOSIS — I129 Hypertensive chronic kidney disease with stage 1 through stage 4 chronic kidney disease, or unspecified chronic kidney disease: Secondary | ICD-10-CM | POA: Diagnosis not present

## 2018-06-13 DIAGNOSIS — N186 End stage renal disease: Secondary | ICD-10-CM | POA: Diagnosis not present

## 2018-06-13 DIAGNOSIS — N2581 Secondary hyperparathyroidism of renal origin: Secondary | ICD-10-CM | POA: Diagnosis not present

## 2018-06-13 DIAGNOSIS — D631 Anemia in chronic kidney disease: Secondary | ICD-10-CM | POA: Diagnosis not present

## 2018-06-13 DIAGNOSIS — D509 Iron deficiency anemia, unspecified: Secondary | ICD-10-CM | POA: Diagnosis not present

## 2018-06-15 DIAGNOSIS — Z23 Encounter for immunization: Secondary | ICD-10-CM | POA: Diagnosis not present

## 2018-06-15 DIAGNOSIS — N2581 Secondary hyperparathyroidism of renal origin: Secondary | ICD-10-CM | POA: Diagnosis not present

## 2018-06-15 DIAGNOSIS — D509 Iron deficiency anemia, unspecified: Secondary | ICD-10-CM | POA: Diagnosis not present

## 2018-06-15 DIAGNOSIS — D631 Anemia in chronic kidney disease: Secondary | ICD-10-CM | POA: Diagnosis not present

## 2018-06-15 DIAGNOSIS — N186 End stage renal disease: Secondary | ICD-10-CM | POA: Diagnosis not present

## 2018-06-18 DIAGNOSIS — D631 Anemia in chronic kidney disease: Secondary | ICD-10-CM | POA: Diagnosis not present

## 2018-06-18 DIAGNOSIS — N2581 Secondary hyperparathyroidism of renal origin: Secondary | ICD-10-CM | POA: Diagnosis not present

## 2018-06-18 DIAGNOSIS — N186 End stage renal disease: Secondary | ICD-10-CM | POA: Diagnosis not present

## 2018-06-18 DIAGNOSIS — Z23 Encounter for immunization: Secondary | ICD-10-CM | POA: Diagnosis not present

## 2018-06-18 DIAGNOSIS — D509 Iron deficiency anemia, unspecified: Secondary | ICD-10-CM | POA: Diagnosis not present

## 2018-06-20 DIAGNOSIS — D631 Anemia in chronic kidney disease: Secondary | ICD-10-CM | POA: Diagnosis not present

## 2018-06-20 DIAGNOSIS — N2581 Secondary hyperparathyroidism of renal origin: Secondary | ICD-10-CM | POA: Diagnosis not present

## 2018-06-20 DIAGNOSIS — D509 Iron deficiency anemia, unspecified: Secondary | ICD-10-CM | POA: Diagnosis not present

## 2018-06-20 DIAGNOSIS — N186 End stage renal disease: Secondary | ICD-10-CM | POA: Diagnosis not present

## 2018-06-20 DIAGNOSIS — Z23 Encounter for immunization: Secondary | ICD-10-CM | POA: Diagnosis not present

## 2018-06-22 DIAGNOSIS — N2581 Secondary hyperparathyroidism of renal origin: Secondary | ICD-10-CM | POA: Diagnosis not present

## 2018-06-22 DIAGNOSIS — Z23 Encounter for immunization: Secondary | ICD-10-CM | POA: Diagnosis not present

## 2018-06-22 DIAGNOSIS — D509 Iron deficiency anemia, unspecified: Secondary | ICD-10-CM | POA: Diagnosis not present

## 2018-06-22 DIAGNOSIS — D631 Anemia in chronic kidney disease: Secondary | ICD-10-CM | POA: Diagnosis not present

## 2018-06-22 DIAGNOSIS — N186 End stage renal disease: Secondary | ICD-10-CM | POA: Diagnosis not present

## 2018-06-25 DIAGNOSIS — N186 End stage renal disease: Secondary | ICD-10-CM | POA: Diagnosis not present

## 2018-06-25 DIAGNOSIS — D509 Iron deficiency anemia, unspecified: Secondary | ICD-10-CM | POA: Diagnosis not present

## 2018-06-25 DIAGNOSIS — N2581 Secondary hyperparathyroidism of renal origin: Secondary | ICD-10-CM | POA: Diagnosis not present

## 2018-06-25 DIAGNOSIS — D631 Anemia in chronic kidney disease: Secondary | ICD-10-CM | POA: Diagnosis not present

## 2018-06-25 DIAGNOSIS — Z23 Encounter for immunization: Secondary | ICD-10-CM | POA: Diagnosis not present

## 2018-06-27 DIAGNOSIS — D509 Iron deficiency anemia, unspecified: Secondary | ICD-10-CM | POA: Diagnosis not present

## 2018-06-27 DIAGNOSIS — N2581 Secondary hyperparathyroidism of renal origin: Secondary | ICD-10-CM | POA: Diagnosis not present

## 2018-06-27 DIAGNOSIS — N186 End stage renal disease: Secondary | ICD-10-CM | POA: Diagnosis not present

## 2018-06-27 DIAGNOSIS — Z23 Encounter for immunization: Secondary | ICD-10-CM | POA: Diagnosis not present

## 2018-06-27 DIAGNOSIS — D631 Anemia in chronic kidney disease: Secondary | ICD-10-CM | POA: Diagnosis not present

## 2018-06-29 DIAGNOSIS — D631 Anemia in chronic kidney disease: Secondary | ICD-10-CM | POA: Diagnosis not present

## 2018-06-29 DIAGNOSIS — D509 Iron deficiency anemia, unspecified: Secondary | ICD-10-CM | POA: Diagnosis not present

## 2018-06-29 DIAGNOSIS — Z23 Encounter for immunization: Secondary | ICD-10-CM | POA: Diagnosis not present

## 2018-06-29 DIAGNOSIS — N186 End stage renal disease: Secondary | ICD-10-CM | POA: Diagnosis not present

## 2018-06-29 DIAGNOSIS — N2581 Secondary hyperparathyroidism of renal origin: Secondary | ICD-10-CM | POA: Diagnosis not present

## 2018-07-02 DIAGNOSIS — D509 Iron deficiency anemia, unspecified: Secondary | ICD-10-CM | POA: Diagnosis not present

## 2018-07-02 DIAGNOSIS — D631 Anemia in chronic kidney disease: Secondary | ICD-10-CM | POA: Diagnosis not present

## 2018-07-02 DIAGNOSIS — N186 End stage renal disease: Secondary | ICD-10-CM | POA: Diagnosis not present

## 2018-07-02 DIAGNOSIS — N2581 Secondary hyperparathyroidism of renal origin: Secondary | ICD-10-CM | POA: Diagnosis not present

## 2018-07-02 DIAGNOSIS — Z23 Encounter for immunization: Secondary | ICD-10-CM | POA: Diagnosis not present

## 2018-07-04 DIAGNOSIS — D509 Iron deficiency anemia, unspecified: Secondary | ICD-10-CM | POA: Diagnosis not present

## 2018-07-04 DIAGNOSIS — N186 End stage renal disease: Secondary | ICD-10-CM | POA: Diagnosis not present

## 2018-07-04 DIAGNOSIS — D631 Anemia in chronic kidney disease: Secondary | ICD-10-CM | POA: Diagnosis not present

## 2018-07-04 DIAGNOSIS — N2581 Secondary hyperparathyroidism of renal origin: Secondary | ICD-10-CM | POA: Diagnosis not present

## 2018-07-04 DIAGNOSIS — Z23 Encounter for immunization: Secondary | ICD-10-CM | POA: Diagnosis not present

## 2018-07-06 DIAGNOSIS — N186 End stage renal disease: Secondary | ICD-10-CM | POA: Diagnosis not present

## 2018-07-06 DIAGNOSIS — N2581 Secondary hyperparathyroidism of renal origin: Secondary | ICD-10-CM | POA: Diagnosis not present

## 2018-07-06 DIAGNOSIS — Z23 Encounter for immunization: Secondary | ICD-10-CM | POA: Diagnosis not present

## 2018-07-06 DIAGNOSIS — D631 Anemia in chronic kidney disease: Secondary | ICD-10-CM | POA: Diagnosis not present

## 2018-07-06 DIAGNOSIS — D509 Iron deficiency anemia, unspecified: Secondary | ICD-10-CM | POA: Diagnosis not present

## 2018-07-09 DIAGNOSIS — D631 Anemia in chronic kidney disease: Secondary | ICD-10-CM | POA: Diagnosis not present

## 2018-07-09 DIAGNOSIS — N186 End stage renal disease: Secondary | ICD-10-CM | POA: Diagnosis not present

## 2018-07-09 DIAGNOSIS — Z23 Encounter for immunization: Secondary | ICD-10-CM | POA: Diagnosis not present

## 2018-07-09 DIAGNOSIS — N2581 Secondary hyperparathyroidism of renal origin: Secondary | ICD-10-CM | POA: Diagnosis not present

## 2018-07-09 DIAGNOSIS — D509 Iron deficiency anemia, unspecified: Secondary | ICD-10-CM | POA: Diagnosis not present

## 2018-07-11 DIAGNOSIS — N186 End stage renal disease: Secondary | ICD-10-CM | POA: Diagnosis not present

## 2018-07-11 DIAGNOSIS — N2581 Secondary hyperparathyroidism of renal origin: Secondary | ICD-10-CM | POA: Diagnosis not present

## 2018-07-11 DIAGNOSIS — D509 Iron deficiency anemia, unspecified: Secondary | ICD-10-CM | POA: Diagnosis not present

## 2018-07-11 DIAGNOSIS — D631 Anemia in chronic kidney disease: Secondary | ICD-10-CM | POA: Diagnosis not present

## 2018-07-11 DIAGNOSIS — Z23 Encounter for immunization: Secondary | ICD-10-CM | POA: Diagnosis not present

## 2018-07-13 DIAGNOSIS — N2581 Secondary hyperparathyroidism of renal origin: Secondary | ICD-10-CM | POA: Diagnosis not present

## 2018-07-13 DIAGNOSIS — Z992 Dependence on renal dialysis: Secondary | ICD-10-CM | POA: Diagnosis not present

## 2018-07-13 DIAGNOSIS — N186 End stage renal disease: Secondary | ICD-10-CM | POA: Diagnosis not present

## 2018-07-13 DIAGNOSIS — D631 Anemia in chronic kidney disease: Secondary | ICD-10-CM | POA: Diagnosis not present

## 2018-07-13 DIAGNOSIS — D509 Iron deficiency anemia, unspecified: Secondary | ICD-10-CM | POA: Diagnosis not present

## 2018-07-13 DIAGNOSIS — Z23 Encounter for immunization: Secondary | ICD-10-CM | POA: Diagnosis not present

## 2018-07-13 DIAGNOSIS — I129 Hypertensive chronic kidney disease with stage 1 through stage 4 chronic kidney disease, or unspecified chronic kidney disease: Secondary | ICD-10-CM | POA: Diagnosis not present

## 2018-07-16 DIAGNOSIS — Z23 Encounter for immunization: Secondary | ICD-10-CM | POA: Diagnosis not present

## 2018-07-16 DIAGNOSIS — D631 Anemia in chronic kidney disease: Secondary | ICD-10-CM | POA: Diagnosis not present

## 2018-07-16 DIAGNOSIS — N186 End stage renal disease: Secondary | ICD-10-CM | POA: Diagnosis not present

## 2018-07-16 DIAGNOSIS — N2581 Secondary hyperparathyroidism of renal origin: Secondary | ICD-10-CM | POA: Diagnosis not present

## 2018-07-16 DIAGNOSIS — D509 Iron deficiency anemia, unspecified: Secondary | ICD-10-CM | POA: Diagnosis not present

## 2018-07-18 DIAGNOSIS — D631 Anemia in chronic kidney disease: Secondary | ICD-10-CM | POA: Diagnosis not present

## 2018-07-18 DIAGNOSIS — D509 Iron deficiency anemia, unspecified: Secondary | ICD-10-CM | POA: Diagnosis not present

## 2018-07-18 DIAGNOSIS — N2581 Secondary hyperparathyroidism of renal origin: Secondary | ICD-10-CM | POA: Diagnosis not present

## 2018-07-18 DIAGNOSIS — Z23 Encounter for immunization: Secondary | ICD-10-CM | POA: Diagnosis not present

## 2018-07-18 DIAGNOSIS — N186 End stage renal disease: Secondary | ICD-10-CM | POA: Diagnosis not present

## 2018-07-20 DIAGNOSIS — Z23 Encounter for immunization: Secondary | ICD-10-CM | POA: Diagnosis not present

## 2018-07-20 DIAGNOSIS — N186 End stage renal disease: Secondary | ICD-10-CM | POA: Diagnosis not present

## 2018-07-20 DIAGNOSIS — N2581 Secondary hyperparathyroidism of renal origin: Secondary | ICD-10-CM | POA: Diagnosis not present

## 2018-07-20 DIAGNOSIS — D631 Anemia in chronic kidney disease: Secondary | ICD-10-CM | POA: Diagnosis not present

## 2018-07-20 DIAGNOSIS — D509 Iron deficiency anemia, unspecified: Secondary | ICD-10-CM | POA: Diagnosis not present

## 2018-07-23 DIAGNOSIS — D509 Iron deficiency anemia, unspecified: Secondary | ICD-10-CM | POA: Diagnosis not present

## 2018-07-23 DIAGNOSIS — N2581 Secondary hyperparathyroidism of renal origin: Secondary | ICD-10-CM | POA: Diagnosis not present

## 2018-07-23 DIAGNOSIS — D631 Anemia in chronic kidney disease: Secondary | ICD-10-CM | POA: Diagnosis not present

## 2018-07-23 DIAGNOSIS — N186 End stage renal disease: Secondary | ICD-10-CM | POA: Diagnosis not present

## 2018-07-23 DIAGNOSIS — Z23 Encounter for immunization: Secondary | ICD-10-CM | POA: Diagnosis not present

## 2018-07-25 DIAGNOSIS — N186 End stage renal disease: Secondary | ICD-10-CM | POA: Diagnosis not present

## 2018-07-25 DIAGNOSIS — D631 Anemia in chronic kidney disease: Secondary | ICD-10-CM | POA: Diagnosis not present

## 2018-07-25 DIAGNOSIS — D509 Iron deficiency anemia, unspecified: Secondary | ICD-10-CM | POA: Diagnosis not present

## 2018-07-25 DIAGNOSIS — N2581 Secondary hyperparathyroidism of renal origin: Secondary | ICD-10-CM | POA: Diagnosis not present

## 2018-07-25 DIAGNOSIS — Z23 Encounter for immunization: Secondary | ICD-10-CM | POA: Diagnosis not present

## 2018-07-27 DIAGNOSIS — Z23 Encounter for immunization: Secondary | ICD-10-CM | POA: Diagnosis not present

## 2018-07-27 DIAGNOSIS — N2581 Secondary hyperparathyroidism of renal origin: Secondary | ICD-10-CM | POA: Diagnosis not present

## 2018-07-27 DIAGNOSIS — N186 End stage renal disease: Secondary | ICD-10-CM | POA: Diagnosis not present

## 2018-07-27 DIAGNOSIS — D631 Anemia in chronic kidney disease: Secondary | ICD-10-CM | POA: Diagnosis not present

## 2018-07-27 DIAGNOSIS — D509 Iron deficiency anemia, unspecified: Secondary | ICD-10-CM | POA: Diagnosis not present

## 2018-07-30 DIAGNOSIS — N2581 Secondary hyperparathyroidism of renal origin: Secondary | ICD-10-CM | POA: Diagnosis not present

## 2018-07-30 DIAGNOSIS — D631 Anemia in chronic kidney disease: Secondary | ICD-10-CM | POA: Diagnosis not present

## 2018-07-30 DIAGNOSIS — N186 End stage renal disease: Secondary | ICD-10-CM | POA: Diagnosis not present

## 2018-07-30 DIAGNOSIS — Z23 Encounter for immunization: Secondary | ICD-10-CM | POA: Diagnosis not present

## 2018-07-30 DIAGNOSIS — D509 Iron deficiency anemia, unspecified: Secondary | ICD-10-CM | POA: Diagnosis not present

## 2018-08-01 DIAGNOSIS — N2581 Secondary hyperparathyroidism of renal origin: Secondary | ICD-10-CM | POA: Diagnosis not present

## 2018-08-01 DIAGNOSIS — D631 Anemia in chronic kidney disease: Secondary | ICD-10-CM | POA: Diagnosis not present

## 2018-08-01 DIAGNOSIS — D509 Iron deficiency anemia, unspecified: Secondary | ICD-10-CM | POA: Diagnosis not present

## 2018-08-01 DIAGNOSIS — Z23 Encounter for immunization: Secondary | ICD-10-CM | POA: Diagnosis not present

## 2018-08-01 DIAGNOSIS — N186 End stage renal disease: Secondary | ICD-10-CM | POA: Diagnosis not present

## 2018-08-03 DIAGNOSIS — N186 End stage renal disease: Secondary | ICD-10-CM | POA: Diagnosis not present

## 2018-08-03 DIAGNOSIS — D509 Iron deficiency anemia, unspecified: Secondary | ICD-10-CM | POA: Diagnosis not present

## 2018-08-03 DIAGNOSIS — Z23 Encounter for immunization: Secondary | ICD-10-CM | POA: Diagnosis not present

## 2018-08-03 DIAGNOSIS — N2581 Secondary hyperparathyroidism of renal origin: Secondary | ICD-10-CM | POA: Diagnosis not present

## 2018-08-03 DIAGNOSIS — D631 Anemia in chronic kidney disease: Secondary | ICD-10-CM | POA: Diagnosis not present

## 2018-08-06 DIAGNOSIS — N186 End stage renal disease: Secondary | ICD-10-CM | POA: Diagnosis not present

## 2018-08-06 DIAGNOSIS — N2581 Secondary hyperparathyroidism of renal origin: Secondary | ICD-10-CM | POA: Diagnosis not present

## 2018-08-06 DIAGNOSIS — D631 Anemia in chronic kidney disease: Secondary | ICD-10-CM | POA: Diagnosis not present

## 2018-08-06 DIAGNOSIS — Z23 Encounter for immunization: Secondary | ICD-10-CM | POA: Diagnosis not present

## 2018-08-06 DIAGNOSIS — D509 Iron deficiency anemia, unspecified: Secondary | ICD-10-CM | POA: Diagnosis not present

## 2018-08-08 DIAGNOSIS — N2581 Secondary hyperparathyroidism of renal origin: Secondary | ICD-10-CM | POA: Diagnosis not present

## 2018-08-08 DIAGNOSIS — D509 Iron deficiency anemia, unspecified: Secondary | ICD-10-CM | POA: Diagnosis not present

## 2018-08-08 DIAGNOSIS — N186 End stage renal disease: Secondary | ICD-10-CM | POA: Diagnosis not present

## 2018-08-08 DIAGNOSIS — Z23 Encounter for immunization: Secondary | ICD-10-CM | POA: Diagnosis not present

## 2018-08-08 DIAGNOSIS — D631 Anemia in chronic kidney disease: Secondary | ICD-10-CM | POA: Diagnosis not present

## 2018-08-10 DIAGNOSIS — Z23 Encounter for immunization: Secondary | ICD-10-CM | POA: Diagnosis not present

## 2018-08-10 DIAGNOSIS — D509 Iron deficiency anemia, unspecified: Secondary | ICD-10-CM | POA: Diagnosis not present

## 2018-08-10 DIAGNOSIS — N2581 Secondary hyperparathyroidism of renal origin: Secondary | ICD-10-CM | POA: Diagnosis not present

## 2018-08-10 DIAGNOSIS — D631 Anemia in chronic kidney disease: Secondary | ICD-10-CM | POA: Diagnosis not present

## 2018-08-10 DIAGNOSIS — N186 End stage renal disease: Secondary | ICD-10-CM | POA: Diagnosis not present

## 2018-08-13 DIAGNOSIS — N186 End stage renal disease: Secondary | ICD-10-CM | POA: Diagnosis not present

## 2018-08-13 DIAGNOSIS — N2581 Secondary hyperparathyroidism of renal origin: Secondary | ICD-10-CM | POA: Diagnosis not present

## 2018-08-13 DIAGNOSIS — D631 Anemia in chronic kidney disease: Secondary | ICD-10-CM | POA: Diagnosis not present

## 2018-08-13 DIAGNOSIS — Z992 Dependence on renal dialysis: Secondary | ICD-10-CM | POA: Diagnosis not present

## 2018-08-13 DIAGNOSIS — I129 Hypertensive chronic kidney disease with stage 1 through stage 4 chronic kidney disease, or unspecified chronic kidney disease: Secondary | ICD-10-CM | POA: Diagnosis not present

## 2018-08-15 DIAGNOSIS — D631 Anemia in chronic kidney disease: Secondary | ICD-10-CM | POA: Diagnosis not present

## 2018-08-15 DIAGNOSIS — N186 End stage renal disease: Secondary | ICD-10-CM | POA: Diagnosis not present

## 2018-08-15 DIAGNOSIS — N2581 Secondary hyperparathyroidism of renal origin: Secondary | ICD-10-CM | POA: Diagnosis not present

## 2018-08-17 DIAGNOSIS — D631 Anemia in chronic kidney disease: Secondary | ICD-10-CM | POA: Diagnosis not present

## 2018-08-17 DIAGNOSIS — N2581 Secondary hyperparathyroidism of renal origin: Secondary | ICD-10-CM | POA: Diagnosis not present

## 2018-08-17 DIAGNOSIS — N186 End stage renal disease: Secondary | ICD-10-CM | POA: Diagnosis not present

## 2018-08-20 DIAGNOSIS — N2581 Secondary hyperparathyroidism of renal origin: Secondary | ICD-10-CM | POA: Diagnosis not present

## 2018-08-20 DIAGNOSIS — N186 End stage renal disease: Secondary | ICD-10-CM | POA: Diagnosis not present

## 2018-08-20 DIAGNOSIS — D631 Anemia in chronic kidney disease: Secondary | ICD-10-CM | POA: Diagnosis not present

## 2018-08-22 DIAGNOSIS — N186 End stage renal disease: Secondary | ICD-10-CM | POA: Diagnosis not present

## 2018-08-22 DIAGNOSIS — D631 Anemia in chronic kidney disease: Secondary | ICD-10-CM | POA: Diagnosis not present

## 2018-08-22 DIAGNOSIS — N2581 Secondary hyperparathyroidism of renal origin: Secondary | ICD-10-CM | POA: Diagnosis not present

## 2018-08-24 DIAGNOSIS — N2581 Secondary hyperparathyroidism of renal origin: Secondary | ICD-10-CM | POA: Diagnosis not present

## 2018-08-24 DIAGNOSIS — D631 Anemia in chronic kidney disease: Secondary | ICD-10-CM | POA: Diagnosis not present

## 2018-08-24 DIAGNOSIS — N186 End stage renal disease: Secondary | ICD-10-CM | POA: Diagnosis not present

## 2018-08-27 DIAGNOSIS — D631 Anemia in chronic kidney disease: Secondary | ICD-10-CM | POA: Diagnosis not present

## 2018-08-27 DIAGNOSIS — N186 End stage renal disease: Secondary | ICD-10-CM | POA: Diagnosis not present

## 2018-08-27 DIAGNOSIS — N2581 Secondary hyperparathyroidism of renal origin: Secondary | ICD-10-CM | POA: Diagnosis not present

## 2018-08-29 DIAGNOSIS — D631 Anemia in chronic kidney disease: Secondary | ICD-10-CM | POA: Diagnosis not present

## 2018-08-29 DIAGNOSIS — N2581 Secondary hyperparathyroidism of renal origin: Secondary | ICD-10-CM | POA: Diagnosis not present

## 2018-08-29 DIAGNOSIS — N186 End stage renal disease: Secondary | ICD-10-CM | POA: Diagnosis not present

## 2018-08-31 DIAGNOSIS — D631 Anemia in chronic kidney disease: Secondary | ICD-10-CM | POA: Diagnosis not present

## 2018-08-31 DIAGNOSIS — N186 End stage renal disease: Secondary | ICD-10-CM | POA: Diagnosis not present

## 2018-08-31 DIAGNOSIS — N2581 Secondary hyperparathyroidism of renal origin: Secondary | ICD-10-CM | POA: Diagnosis not present

## 2018-09-03 DIAGNOSIS — N2581 Secondary hyperparathyroidism of renal origin: Secondary | ICD-10-CM | POA: Diagnosis not present

## 2018-09-03 DIAGNOSIS — N186 End stage renal disease: Secondary | ICD-10-CM | POA: Diagnosis not present

## 2018-09-03 DIAGNOSIS — D631 Anemia in chronic kidney disease: Secondary | ICD-10-CM | POA: Diagnosis not present

## 2018-09-05 DIAGNOSIS — N186 End stage renal disease: Secondary | ICD-10-CM | POA: Diagnosis not present

## 2018-09-05 DIAGNOSIS — N2581 Secondary hyperparathyroidism of renal origin: Secondary | ICD-10-CM | POA: Diagnosis not present

## 2018-09-05 DIAGNOSIS — D631 Anemia in chronic kidney disease: Secondary | ICD-10-CM | POA: Diagnosis not present

## 2018-09-07 DIAGNOSIS — N186 End stage renal disease: Secondary | ICD-10-CM | POA: Diagnosis not present

## 2018-09-07 DIAGNOSIS — D631 Anemia in chronic kidney disease: Secondary | ICD-10-CM | POA: Diagnosis not present

## 2018-09-07 DIAGNOSIS — N2581 Secondary hyperparathyroidism of renal origin: Secondary | ICD-10-CM | POA: Diagnosis not present

## 2018-09-10 DIAGNOSIS — N2581 Secondary hyperparathyroidism of renal origin: Secondary | ICD-10-CM | POA: Diagnosis not present

## 2018-09-10 DIAGNOSIS — D631 Anemia in chronic kidney disease: Secondary | ICD-10-CM | POA: Diagnosis not present

## 2018-09-10 DIAGNOSIS — N186 End stage renal disease: Secondary | ICD-10-CM | POA: Diagnosis not present

## 2018-09-12 DIAGNOSIS — N2581 Secondary hyperparathyroidism of renal origin: Secondary | ICD-10-CM | POA: Diagnosis not present

## 2018-09-12 DIAGNOSIS — D631 Anemia in chronic kidney disease: Secondary | ICD-10-CM | POA: Diagnosis not present

## 2018-09-12 DIAGNOSIS — Z992 Dependence on renal dialysis: Secondary | ICD-10-CM | POA: Diagnosis not present

## 2018-09-12 DIAGNOSIS — I129 Hypertensive chronic kidney disease with stage 1 through stage 4 chronic kidney disease, or unspecified chronic kidney disease: Secondary | ICD-10-CM | POA: Diagnosis not present

## 2018-09-12 DIAGNOSIS — N186 End stage renal disease: Secondary | ICD-10-CM | POA: Diagnosis not present

## 2018-09-14 DIAGNOSIS — N186 End stage renal disease: Secondary | ICD-10-CM | POA: Diagnosis not present

## 2018-09-14 DIAGNOSIS — D631 Anemia in chronic kidney disease: Secondary | ICD-10-CM | POA: Diagnosis not present

## 2018-09-14 DIAGNOSIS — N2581 Secondary hyperparathyroidism of renal origin: Secondary | ICD-10-CM | POA: Diagnosis not present

## 2018-09-17 DIAGNOSIS — D631 Anemia in chronic kidney disease: Secondary | ICD-10-CM | POA: Diagnosis not present

## 2018-09-17 DIAGNOSIS — N2581 Secondary hyperparathyroidism of renal origin: Secondary | ICD-10-CM | POA: Diagnosis not present

## 2018-09-17 DIAGNOSIS — N186 End stage renal disease: Secondary | ICD-10-CM | POA: Diagnosis not present

## 2018-09-19 DIAGNOSIS — N2581 Secondary hyperparathyroidism of renal origin: Secondary | ICD-10-CM | POA: Diagnosis not present

## 2018-09-19 DIAGNOSIS — D631 Anemia in chronic kidney disease: Secondary | ICD-10-CM | POA: Diagnosis not present

## 2018-09-19 DIAGNOSIS — N186 End stage renal disease: Secondary | ICD-10-CM | POA: Diagnosis not present

## 2018-09-21 DIAGNOSIS — D631 Anemia in chronic kidney disease: Secondary | ICD-10-CM | POA: Diagnosis not present

## 2018-09-21 DIAGNOSIS — N2581 Secondary hyperparathyroidism of renal origin: Secondary | ICD-10-CM | POA: Diagnosis not present

## 2018-09-21 DIAGNOSIS — N186 End stage renal disease: Secondary | ICD-10-CM | POA: Diagnosis not present

## 2018-09-24 DIAGNOSIS — N186 End stage renal disease: Secondary | ICD-10-CM | POA: Diagnosis not present

## 2018-09-24 DIAGNOSIS — N2581 Secondary hyperparathyroidism of renal origin: Secondary | ICD-10-CM | POA: Diagnosis not present

## 2018-09-24 DIAGNOSIS — D631 Anemia in chronic kidney disease: Secondary | ICD-10-CM | POA: Diagnosis not present

## 2018-09-26 DIAGNOSIS — N2581 Secondary hyperparathyroidism of renal origin: Secondary | ICD-10-CM | POA: Diagnosis not present

## 2018-09-26 DIAGNOSIS — N186 End stage renal disease: Secondary | ICD-10-CM | POA: Diagnosis not present

## 2018-09-26 DIAGNOSIS — D631 Anemia in chronic kidney disease: Secondary | ICD-10-CM | POA: Diagnosis not present

## 2018-09-28 DIAGNOSIS — D631 Anemia in chronic kidney disease: Secondary | ICD-10-CM | POA: Diagnosis not present

## 2018-09-28 DIAGNOSIS — N2581 Secondary hyperparathyroidism of renal origin: Secondary | ICD-10-CM | POA: Diagnosis not present

## 2018-09-28 DIAGNOSIS — N186 End stage renal disease: Secondary | ICD-10-CM | POA: Diagnosis not present

## 2018-10-01 DIAGNOSIS — D631 Anemia in chronic kidney disease: Secondary | ICD-10-CM | POA: Diagnosis not present

## 2018-10-01 DIAGNOSIS — N186 End stage renal disease: Secondary | ICD-10-CM | POA: Diagnosis not present

## 2018-10-01 DIAGNOSIS — N2581 Secondary hyperparathyroidism of renal origin: Secondary | ICD-10-CM | POA: Diagnosis not present

## 2018-10-03 DIAGNOSIS — N2581 Secondary hyperparathyroidism of renal origin: Secondary | ICD-10-CM | POA: Diagnosis not present

## 2018-10-03 DIAGNOSIS — N186 End stage renal disease: Secondary | ICD-10-CM | POA: Diagnosis not present

## 2018-10-03 DIAGNOSIS — D631 Anemia in chronic kidney disease: Secondary | ICD-10-CM | POA: Diagnosis not present

## 2018-10-05 DIAGNOSIS — N186 End stage renal disease: Secondary | ICD-10-CM | POA: Diagnosis not present

## 2018-10-05 DIAGNOSIS — D631 Anemia in chronic kidney disease: Secondary | ICD-10-CM | POA: Diagnosis not present

## 2018-10-05 DIAGNOSIS — N2581 Secondary hyperparathyroidism of renal origin: Secondary | ICD-10-CM | POA: Diagnosis not present

## 2018-10-08 DIAGNOSIS — N186 End stage renal disease: Secondary | ICD-10-CM | POA: Diagnosis not present

## 2018-10-08 DIAGNOSIS — D631 Anemia in chronic kidney disease: Secondary | ICD-10-CM | POA: Diagnosis not present

## 2018-10-08 DIAGNOSIS — N2581 Secondary hyperparathyroidism of renal origin: Secondary | ICD-10-CM | POA: Diagnosis not present

## 2018-10-10 DIAGNOSIS — N186 End stage renal disease: Secondary | ICD-10-CM | POA: Diagnosis not present

## 2018-10-10 DIAGNOSIS — N2581 Secondary hyperparathyroidism of renal origin: Secondary | ICD-10-CM | POA: Diagnosis not present

## 2018-10-10 DIAGNOSIS — D631 Anemia in chronic kidney disease: Secondary | ICD-10-CM | POA: Diagnosis not present

## 2018-10-12 DIAGNOSIS — N186 End stage renal disease: Secondary | ICD-10-CM | POA: Diagnosis not present

## 2018-10-12 DIAGNOSIS — D631 Anemia in chronic kidney disease: Secondary | ICD-10-CM | POA: Diagnosis not present

## 2018-10-12 DIAGNOSIS — N2581 Secondary hyperparathyroidism of renal origin: Secondary | ICD-10-CM | POA: Diagnosis not present

## 2018-10-13 DIAGNOSIS — I129 Hypertensive chronic kidney disease with stage 1 through stage 4 chronic kidney disease, or unspecified chronic kidney disease: Secondary | ICD-10-CM | POA: Diagnosis not present

## 2018-10-13 DIAGNOSIS — N186 End stage renal disease: Secondary | ICD-10-CM | POA: Diagnosis not present

## 2018-10-13 DIAGNOSIS — Z992 Dependence on renal dialysis: Secondary | ICD-10-CM | POA: Diagnosis not present

## 2018-10-15 DIAGNOSIS — N186 End stage renal disease: Secondary | ICD-10-CM | POA: Diagnosis not present

## 2018-10-15 DIAGNOSIS — N2581 Secondary hyperparathyroidism of renal origin: Secondary | ICD-10-CM | POA: Diagnosis not present

## 2018-10-15 DIAGNOSIS — D631 Anemia in chronic kidney disease: Secondary | ICD-10-CM | POA: Diagnosis not present

## 2018-10-15 DIAGNOSIS — Z992 Dependence on renal dialysis: Secondary | ICD-10-CM | POA: Diagnosis not present

## 2018-10-16 ENCOUNTER — Encounter: Payer: Self-pay | Admitting: Podiatry

## 2018-10-16 ENCOUNTER — Other Ambulatory Visit: Payer: Self-pay

## 2018-10-16 ENCOUNTER — Ambulatory Visit (INDEPENDENT_AMBULATORY_CARE_PROVIDER_SITE_OTHER): Payer: Medicare Other | Admitting: Podiatry

## 2018-10-16 VITALS — BP 113/57 | Temp 98.1°F

## 2018-10-16 DIAGNOSIS — L84 Corns and callosities: Secondary | ICD-10-CM | POA: Diagnosis not present

## 2018-10-16 DIAGNOSIS — N186 End stage renal disease: Secondary | ICD-10-CM

## 2018-10-16 DIAGNOSIS — M2012 Hallux valgus (acquired), left foot: Secondary | ICD-10-CM

## 2018-10-16 DIAGNOSIS — M2011 Hallux valgus (acquired), right foot: Secondary | ICD-10-CM

## 2018-10-16 DIAGNOSIS — M2041 Other hammer toe(s) (acquired), right foot: Secondary | ICD-10-CM | POA: Diagnosis not present

## 2018-10-16 DIAGNOSIS — D631 Anemia in chronic kidney disease: Secondary | ICD-10-CM | POA: Diagnosis not present

## 2018-10-16 DIAGNOSIS — M79674 Pain in right toe(s): Secondary | ICD-10-CM

## 2018-10-16 DIAGNOSIS — M2042 Other hammer toe(s) (acquired), left foot: Secondary | ICD-10-CM | POA: Diagnosis not present

## 2018-10-16 DIAGNOSIS — B351 Tinea unguium: Secondary | ICD-10-CM | POA: Diagnosis not present

## 2018-10-16 DIAGNOSIS — M79675 Pain in left toe(s): Secondary | ICD-10-CM | POA: Diagnosis not present

## 2018-10-16 DIAGNOSIS — B353 Tinea pedis: Secondary | ICD-10-CM | POA: Diagnosis not present

## 2018-10-16 MED ORDER — CLOTRIMAZOLE 1 % EX CREA
TOPICAL_CREAM | CUTANEOUS | 1 refills | Status: DC
Start: 2018-10-16 — End: 2018-12-31

## 2018-10-16 NOTE — Patient Instructions (Signed)
Corns and Calluses Corns are small areas of thickened skin that occur on the top, sides, or tip of a toe. They contain a cone-shaped core with a point that can press on a nerve below. This causes pain.  Calluses are areas of thickened skin that can occur anywhere on the body, including the hands, fingers, palms, soles of the feet, and heels. Calluses are usually larger than corns. What are the causes? Corns and calluses are caused by rubbing (friction) or pressure, such as from shoes that are too tight or do not fit properly. What increases the risk? Corns are more likely to develop in people who have misshapen toes (toe deformities), such as hammer toes. Calluses can occur with friction to any area of the skin. They are more likely to develop in people who:  Work with their hands.  Wear shoes that fit poorly, are too tight, or are high-heeled.  Have toe deformities. What are the signs or symptoms? Symptoms of a corn or callus include:  A hard growth on the skin.  Pain or tenderness under the skin.  Redness and swelling.  Increased discomfort while wearing tight-fitting shoes, if your feet are affected. If a corn or callus becomes infected, symptoms may include:  Redness and swelling that gets worse.  Pain.  Fluid, blood, or pus draining from the corn or callus. How is this diagnosed? Corns and calluses may be diagnosed based on your symptoms, your medical history, and a physical exam. How is this treated? Treatment for corns and calluses may include:  Removing the cause of the friction or pressure. This may involve: ? Changing your shoes. ? Wearing shoe inserts (orthotics) or other protective layers in your shoes, such as a corn pad. ? Wearing gloves.  Applying medicine to the skin (topical medicine) to help soften skin in the hardened, thickened areas.  Removing layers of dead skin with a file to reduce the size of the corn or callus.  Removing the corn or callus with a  scalpel or laser.  Taking antibiotic medicines, if your corn or callus is infected.  Having surgery, if a toe deformity is the cause. Follow these instructions at home:   Take over-the-counter and prescription medicines only as told by your health care provider.  If you were prescribed an antibiotic, take it as told by your health care provider. Do not stop taking it even if your condition starts to improve.  Wear shoes that fit well. Avoid wearing high-heeled shoes and shoes that are too tight or too loose.  Wear any padding, protective layers, gloves, or orthotics as told by your health care provider.  Soak your hands or feet and then use a file or pumice stone to soften your corn or callus. Do this as told by your health care provider.  Check your corn or callus every day for symptoms of infection. Contact a health care provider if you:  Notice that your symptoms do not improve with treatment.  Have redness or swelling that gets worse.  Notice that your corn or callus becomes painful.  Have fluid, blood, or pus coming from your corn or callus.  Have new symptoms. Summary  Corns are small areas of thickened skin that occur on the top, sides, or tip of a toe.  Calluses are areas of thickened skin that can occur anywhere on the body, including the hands, fingers, palms, and soles of the feet. Calluses are usually larger than corns.  Corns and calluses are caused by   rubbing (friction) or pressure, such as from shoes that are too tight or do not fit properly.  Treatment may include wearing any padding, protective layers, gloves, or orthotics as told by your health care provider. This information is not intended to replace advice given to you by your health care provider. Make sure you discuss any questions you have with your health care provider. Document Released: 12/05/2003 Document Revised: 06/20/2018 Document Reviewed: 01/11/2017 Elsevier Patient Education  [place]  Athlete's foot (tinea pedis) is a fungal infection of the skin on your feet. It often occurs on the skin that is between or underneath the toes. It can also occur on the soles of your feet. The infection can spread from person to person (is contagious). It can also spread when a person's bare feet come in contact with the fungus on shower floors or on items such as shoes. What are the causes? This condition is caused by a fungus that grows in warm, moist places. You can get athlete's foot by sharing shoes, shower stalls, towels, and wet floors with someone who is infected. Not washing your feet or changing your socks often enough can also lead to athlete's foot. What increases the risk? This condition is more likely to develop in:  Men.  People who have a weak body defense system (immune system).  People who have diabetes.  People who use public showers, such as at a gym.  People who wear heavy-duty shoes, such as Environmental manager.  Seasons with warm, humid weather. What are the signs or symptoms? Symptoms of this condition include:  Itchy areas between your toes or on the soles of your feet.  White, flaky, or scaly areas between your toes or on the soles of your feet.  Very itchy small blisters between your toes or on the soles of your feet.  Small cuts in your skin. These cuts can become infected.  Thick or discolored toenails. How is this diagnosed? This condition may be diagnosed with a physical exam and a review of your medical history. Your health care provider may also take a skin or toenail sample to examine under a microscope. How is this treated? This condition is treated with antifungal medicines. These may be applied as powders, ointments, or creams. In severe cases, an oral antifungal medicine may be given. Follow these instructions at home: Medicines  Apply or take over-the-counter and prescription medicines only as told by your  health care provider.  Apply your antifungal medicine as told by your health care provider. Do not stop using the antifungal even if your condition improves. Foot care  Do not scratch your feet.  Keep your feet dry: ? Wear cotton or wool socks. Change your socks every day or if they become wet. ? Wear shoes that allow air to flow, such as sandals or canvas tennis shoes.  Wash and dry your feet, including the area between your toes. Also, wash and dry your feet: ? Every day or as told by your health care provider. ? After exercising. General instructions  Do not let others use towels, shoes, nail clippers, or other personal items that touch your feet.  Protect your feet by wearing sandals in wet areas, such as locker rooms and shared showers.  Keep all follow-up visits as told by your health care provider. This is important.  If you have diabetes, keep your blood sugar under control. Contact a health care provider if:  You have  a fever.  You have swelling, soreness, warmth, or redness in your foot.  Your feet are not getting better with treatment.  Your symptoms get worse.  You have new symptoms. Summary  Athlete's foot (tinea pedis) is a fungal infection of the skin on your feet. It often occurs on skin that is between or underneath the toes.  This condition is caused by a fungus that grows in warm, moist places.  Symptoms include white, flaky, or scaly areas between your toes or on the soles of your feet.  This condition is treated with antifungal medicines.  Keep your feet clean. Always dry them thoroughly. This information is not intended to replace advice given to you by your health care provider. Make sure you discuss any questions you have with your health care provider. Document Released: 02/26/2000 Document Revised: 02/23/2017 Document Reviewed: 12/19/2016 Elsevier Patient Education  [place]? An  infection that lies within the keratin of your nail plate that is caused by a fungus.  WHY ME? Fungal infections affect all ages, sexes, races, and creeds.  There may be many factors that predispose you to a fungal infection such as age, coexisting medical conditions such as diabetes, or an autoimmune disease; stress, medications, fatigue, genetics, etc.  Bottom line: fungus thrives in a warm, moist environment and your shoes offer such a location.  IS IT CONTAGIOUS? Theoretically, yes.  You do not want to share shoes, nail clippers or files with someone who has fungal toenails.  Walking around barefoot in the same room or sleeping in the same bed is unlikely to transfer the organism.  It is important to realize, however, that fungus can spread easily from one nail to the next on the same foot.  HOW DO WE TREAT THIS?  There are several ways to treat this condition.  Treatment may depend on many factors such as age, medications, pregnancy, liver and kidney conditions, etc.  It is best to ask your doctor which options are available to you.  1. No treatment.   Unlike many other medical concerns, you can live with this condition.  However for many people this can be a painful condition and may lead to ingrown toenails or a bacterial infection.  It is recommended that you keep the nails cut short to help reduce the amount of fungal nail. 2. Topical treatment.  These range from herbal remedies to prescription strength nail lacquers.  About 40-50% effective, topicals require twice daily application for approximately 9 to 12 months or until an entirely new nail has grown out.  The most effective topicals are medical grade medications available through physicians offices. 3. Oral antifungal medications.  With an 80-90% cure rate, the most common oral medication requires 3 to 4 months of therapy and stays in your system for a year as the new nail grows out.  Oral antifungal medications do require blood work to make  sure it is a safe drug for you.  A liver function panel will be performed prior to starting the medication and after the first month of treatment.  It is important to have the blood work performed to avoid any harmful side effects.  In general, this medication safe but blood work is required. 4. Laser Therapy.  This treatment is performed by applying a specialized laser to the affected nail plate.  This therapy is noninvasive, fast, and non-painful.  It is not covered by insurance and is therefore, out of pocket.  The results have been very good with a 80-95% cure rate.  [facility] is the only practice in the area to offer this therapy. 5. Permanent Nail Avulsion.  Removing the entire nail so that a new nail will not grow back.

## 2018-10-17 DIAGNOSIS — N186 End stage renal disease: Secondary | ICD-10-CM | POA: Diagnosis not present

## 2018-10-17 DIAGNOSIS — Z992 Dependence on renal dialysis: Secondary | ICD-10-CM | POA: Diagnosis not present

## 2018-10-17 DIAGNOSIS — N2581 Secondary hyperparathyroidism of renal origin: Secondary | ICD-10-CM | POA: Diagnosis not present

## 2018-10-17 DIAGNOSIS — D631 Anemia in chronic kidney disease: Secondary | ICD-10-CM | POA: Diagnosis not present

## 2018-10-18 ENCOUNTER — Encounter: Payer: Self-pay | Admitting: Podiatry

## 2018-10-18 NOTE — Progress Notes (Signed)
Subjective: Dwayne Reed presents today referred by Elby Beck, FNP with cc of painful, discolored, thick toenails which interfere with daily activities.  Pain is aggravated when wearing enclosed shoe gear.   He also has c/o of calluses on plantar aspect of both feet for several months in duration. Pain is aggravated when weightbearing with or without shoe gear. He has made no attempt to trim them.  Patient is on dialysis on MWF.  Past Medical History:  Diagnosis Date  . Anemia    low iron  . CHF (congestive heart failure) ([place])    pt denies  . COPD (chronic obstructive pulmonary disease) ([place])   . History of kidney stones   . Hypertension   . Kidney stones   . Pneumonia   . Renal disorder    ESRD on HD (M,W,F)     Patient Active Problem List   Diagnosis Date Noted  . Breakdown of surgically created AV fistula, init ([place]) 10/23/2017  . Decreased energy 10/23/2017  . Acute on chronic systolic CHF (congestive heart failure) ([place]) 07/26/2017  . ESRD on dialysis ([place]) 07/26/2017  . SOB (shortness of breath) 07/25/2017  . 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 COPD (chronic obstructive pulmonary disease) ([place]) 07/14/2016  . History of renal cell cancer 07/12/2016  . Olecranon bursitis of left elbow 05/27/2016  . Malignant tumor of prostate ([place]) 09/18/2015  . Benign prostatic hyperplasia with lower urinary tract symptoms 08/05/2015  . Pre-transplant evaluation for kidney transplant 08/05/2015  . Acquired arteriovenous fistula ([place]) 04/30/2015  . ESRD on hemodialysis ([place]) 02/04/2015  . Hyperparathyroidism ([place]) 02/04/2015  . Hyperphosphatemia 02/04/2015  . Anemia of renal disease 02/04/2015  . History of transfusion 12/13/2014  . Chronic kidney disease, stage IV (severe) ([place]) 08/18/2012  . Elevated prostate specific antigen (PSA) 08/18/2012  . Essential hypertension 08/18/2012  . Gout 08/18/2012  . Acquired cyst of kidney 04/13/2012     Past Surgical  History:  Procedure Laterality Date  . EXTRACORPOREAL SHOCK WAVE LITHOTRIPSY    . LITHOTRIPSY     x 5  . PROSTATE BIOPSY      Alpha-Beta Blockers carvedilol (COREG) 25 MG tablet 1 tablet, 2 times daily    Angiotensin II Receptor Antagonists losartan (COZAAR) 25 MG tablet 25 mg, Daily    Antacids - Bicarbonate sodium bicarbonate 650 MG tablet 650 mg, 2 times daily    Antiadrenergic Antihypertensives doxazosin (CARDURA) 4 MG tablet 2 mg, Daily    B-Complex w/ Folic Acid B Complex-C-Zn-Folic Acid (DIALYVITE 542-HCWC 15) 0.8 MG TABS    Calcium Channel Blockers amLODipine (NORVASC) 10 MG tablet 10 mg, Every evening    amLODipine (NORVASC) 5 MG tablet 1 tablet, Daily    Multiple Vitamins w/ Minerals Multiple Vitamins-Minerals (MULTIVITAMIN WITH MINERALS) tablet    Pediatric Multiple Vitamins Pediatric Multiple Vitamins (CHEWABLE MULTIPLE VITAMINS PO) 1 tablet, Every other day    Phosphate Binder Agents AURYXIA 1 GM 210 MG(Fe) tablet 1 tablet, 3 times daily    Sympathomimetics albuterol (PROVENTIL HFA;VENTOLIN HFA) 108 (90 Base) MCG/ACT inhaler 2 puff, Every 6 hours PRN    Fluticasone-Salmeterol (ADVAIR) 100-50 MCG/DOSE AEPB    Zinc zinc gluconate 50 MG tablet 50 mg, Daily   No Known Allergies   Social History   Occupational History  . Occupation: admin support speacialist  Tobacco Use  . Smoking status: Current Every Day Smoker    Packs/day: 0.15  . Smokeless tobacco: Never Used  Substance and Sexual Activity  . Alcohol use: Yes  Frequency: Never    Comment: on holidays  . Drug use: Never  . Sexual activity: Not on file     Family History  Problem Relation Age of Onset  . Heart disease Mother   . Sarcoidosis Sister   . Cancer Maternal Grandmother        type unknown     Immunization History  Administered Date(s) Administered  . Influenza Split 12/13/2011  . Influenza,inj,quad, With Preservative 01/15/2014  . Influenza-Unspecified 12/22/2014, 12/26/2016  .  Pneumococcal Polysaccharide-23 10/01/2014     Review of systems: Positive Findings in bold print.  Constitutional:  chills, fatigue, fever, sweats, weight change Communication: Optometrist, sign Ecologist, hand writing, iPad/Android device Head: headaches, head injury Eyes: changes in vision, eye pain, glaucoma, cataracts, macular degeneration, diplopia, glare,  light sensitivity, eyeglasses or contacts, blindness Ears nose mouth throat: hearing impaired, hearing aids,  ringing in ears, deaf, sign language,  vertigo,   nosebleeds,  rhinitis,  cold sores, snoring, swollen glands Cardiovascular: HTN, edema, arrhythmia, pacemaker in place, defibrillator in place, chest pain/tightness, chronic anticoagulation, blood clot, heart failure, MI Peripheral Vascular: leg cramps, varicose veins, blood clots, lymphedema, varicosities, AV fistula Respiratory:  difficulty breathing, denies congestion, SOB, wheezing, cough, emphysema Gastrointestinal: change in appetite or weight, abdominal pain, constipation, diarrhea, nausea, vomiting, vomiting blood, change in bowel habits, abdominal pain, jaundice, rectal bleeding, hemorrhoids, GERD Genitourinary:  nocturia,  pain on urination, polyuria,  blood in urine, Foley catheter, urinary urgency, ESRD on hemodialysis Musculoskeletal: amputation, cramping, stiff joints, painful joints, decreased joint motion, fractures, OA, gout, hemiplegia, paraplegia, uses cane, wheelchair bound, uses walker, uses rollator Skin: +changes in toenails, color change, dryness, itching, mole changes,  rash, wound(s) Neurological: headaches, numbness in feet, paresthesias in feet, burning in feet, fainting,  seizures, change in speech. denies headaches, memory problems/poor historian, cerebral palsy, weakness, paralysis, CVA, TIA Endocrine: diabetes, hypothyroidism, hyperthyroidism,  goiter, dry mouth, flushing, heat intolerance,  cold intolerance,  excessive thirst, denies  polyuria,  nocturia Hematological:  easy bleeding, excessive bleeding, easy bruising, enlarged lymph nodes, on long term blood thinner, history of past transusions Allergy/immunological:  hives, eczema, frequent infections, multiple drug allergies, seasonal allergies, transplant recipient, multiple food allergies Psychiatric:  anxiety, depression, mood disorder, suicidal ideations, hallucinations, insomnia  Objective: Vitals:   10/16/18 1157  BP: (!) 113/57  Temp: 98.1 F (36.7 C)    Vascular Examination: Capillary refill time <3 seconds x 10 digits.  Dorsalis pedis pulses palpable b/l.   Posterior tibial pulses palpable b/l.   No digital hair x 10 digits.  Skin temperature gradient WNL b/l  Dermatological Examination: Skin with normal turgor, texture and tone b/l.  Toenails 1-5 b/l discolored, thick, dystrophic with subungual debris and pain with palpation to nailbeds due to thickness of nails.  Hyperkeratotic lesion submet head 2 right foot and submet head 1 left foot with tenderness to palpation. No edema, no erythema, no drainage, no flocculence.  Diffuse scaling noted peripherally and plantarly b/l feet with mild foot odor.  No interdigital macerations.  No blisters, no weeping. No signs of secondary bacterial infection noted.  Musculoskeletal: Muscle strength 5/5 to all LE muscle groups.  HAV with bunion b/l.  Hammertoes lesser digits b/l.  Neurological: Sensation intact 5/5 b/l with 10 gram monofilament.  Vibratory sensation intact b/l.  Assessment: 1. Painful onychomycosis toenails 1-5 b/l  2. Calluses submet head 2 right, submet head 1 left foot 3. Anemia secondary to ESRD (on hemodialysis) 4. HAV with bunion 5. Hammertoes 2-5  b/l  Plan: 1. Discussed onychomycosis and treatment options.  Literature dispensed on today. 2. Toenails 1-5 b/l were debrided in length and girth without iatrogenic bleeding.  Rx written for nonformulary compounding topical  antifungal: [facility]: Antifungal cream - Terbinafine 3%, Fluconazole 2%, Tea Tree Oil 5%, Urea 10%, Ibuprofen 2% in DMSO Suspension #21ml. Apply to the affected nail(s) at bedtime. 3. Rx for Clotrimazole Cream 1% to be applied to both feet and between toes bid x 4 weeks 4. Calluses pared submetatarsal head 2 right foot, submet head 1 left foot utilizing sterile scalpel blade without incident. 5. Patient to continue soft, supportive shoe gear daily. 6. Patient to report any pedal injuries to medical professional immediately. 7. Follow up 3 months.  8. Patient/POA to call should there be a concern in the interim.

## 2018-10-19 DIAGNOSIS — D631 Anemia in chronic kidney disease: Secondary | ICD-10-CM | POA: Diagnosis not present

## 2018-10-19 DIAGNOSIS — N2581 Secondary hyperparathyroidism of renal origin: Secondary | ICD-10-CM | POA: Diagnosis not present

## 2018-10-19 DIAGNOSIS — N186 End stage renal disease: Secondary | ICD-10-CM | POA: Diagnosis not present

## 2018-10-19 DIAGNOSIS — Z992 Dependence on renal dialysis: Secondary | ICD-10-CM | POA: Diagnosis not present

## 2018-10-22 DIAGNOSIS — N2581 Secondary hyperparathyroidism of renal origin: Secondary | ICD-10-CM | POA: Diagnosis not present

## 2018-10-22 DIAGNOSIS — D631 Anemia in chronic kidney disease: Secondary | ICD-10-CM | POA: Diagnosis not present

## 2018-10-22 DIAGNOSIS — Z992 Dependence on renal dialysis: Secondary | ICD-10-CM | POA: Diagnosis not present

## 2018-10-22 DIAGNOSIS — N186 End stage renal disease: Secondary | ICD-10-CM | POA: Diagnosis not present

## 2018-10-24 DIAGNOSIS — N2581 Secondary hyperparathyroidism of renal origin: Secondary | ICD-10-CM | POA: Diagnosis not present

## 2018-10-24 DIAGNOSIS — Z992 Dependence on renal dialysis: Secondary | ICD-10-CM | POA: Diagnosis not present

## 2018-10-24 DIAGNOSIS — D631 Anemia in chronic kidney disease: Secondary | ICD-10-CM | POA: Diagnosis not present

## 2018-10-24 DIAGNOSIS — N186 End stage renal disease: Secondary | ICD-10-CM | POA: Diagnosis not present

## 2018-10-26 DIAGNOSIS — Z992 Dependence on renal dialysis: Secondary | ICD-10-CM | POA: Diagnosis not present

## 2018-10-26 DIAGNOSIS — N2581 Secondary hyperparathyroidism of renal origin: Secondary | ICD-10-CM | POA: Diagnosis not present

## 2018-10-26 DIAGNOSIS — D631 Anemia in chronic kidney disease: Secondary | ICD-10-CM | POA: Diagnosis not present

## 2018-10-26 DIAGNOSIS — N186 End stage renal disease: Secondary | ICD-10-CM | POA: Diagnosis not present

## 2018-10-29 DIAGNOSIS — Z992 Dependence on renal dialysis: Secondary | ICD-10-CM | POA: Diagnosis not present

## 2018-10-29 DIAGNOSIS — D631 Anemia in chronic kidney disease: Secondary | ICD-10-CM | POA: Diagnosis not present

## 2018-10-29 DIAGNOSIS — N186 End stage renal disease: Secondary | ICD-10-CM | POA: Diagnosis not present

## 2018-10-29 DIAGNOSIS — N2581 Secondary hyperparathyroidism of renal origin: Secondary | ICD-10-CM | POA: Diagnosis not present

## 2018-10-31 DIAGNOSIS — N2581 Secondary hyperparathyroidism of renal origin: Secondary | ICD-10-CM | POA: Diagnosis not present

## 2018-10-31 DIAGNOSIS — N186 End stage renal disease: Secondary | ICD-10-CM | POA: Diagnosis not present

## 2018-10-31 DIAGNOSIS — D631 Anemia in chronic kidney disease: Secondary | ICD-10-CM | POA: Diagnosis not present

## 2018-10-31 DIAGNOSIS — Z992 Dependence on renal dialysis: Secondary | ICD-10-CM | POA: Diagnosis not present

## 2018-11-02 DIAGNOSIS — Z992 Dependence on renal dialysis: Secondary | ICD-10-CM | POA: Diagnosis not present

## 2018-11-02 DIAGNOSIS — D631 Anemia in chronic kidney disease: Secondary | ICD-10-CM | POA: Diagnosis not present

## 2018-11-02 DIAGNOSIS — N2581 Secondary hyperparathyroidism of renal origin: Secondary | ICD-10-CM | POA: Diagnosis not present

## 2018-11-02 DIAGNOSIS — N186 End stage renal disease: Secondary | ICD-10-CM | POA: Diagnosis not present

## 2018-11-05 DIAGNOSIS — N2581 Secondary hyperparathyroidism of renal origin: Secondary | ICD-10-CM | POA: Diagnosis not present

## 2018-11-05 DIAGNOSIS — Z992 Dependence on renal dialysis: Secondary | ICD-10-CM | POA: Diagnosis not present

## 2018-11-05 DIAGNOSIS — N186 End stage renal disease: Secondary | ICD-10-CM | POA: Diagnosis not present

## 2018-11-05 DIAGNOSIS — D631 Anemia in chronic kidney disease: Secondary | ICD-10-CM | POA: Diagnosis not present

## 2018-11-07 DIAGNOSIS — Z992 Dependence on renal dialysis: Secondary | ICD-10-CM | POA: Diagnosis not present

## 2018-11-07 DIAGNOSIS — D631 Anemia in chronic kidney disease: Secondary | ICD-10-CM | POA: Diagnosis not present

## 2018-11-07 DIAGNOSIS — N2581 Secondary hyperparathyroidism of renal origin: Secondary | ICD-10-CM | POA: Diagnosis not present

## 2018-11-07 DIAGNOSIS — N186 End stage renal disease: Secondary | ICD-10-CM | POA: Diagnosis not present

## 2018-11-09 DIAGNOSIS — N2581 Secondary hyperparathyroidism of renal origin: Secondary | ICD-10-CM | POA: Diagnosis not present

## 2018-11-09 DIAGNOSIS — D631 Anemia in chronic kidney disease: Secondary | ICD-10-CM | POA: Diagnosis not present

## 2018-11-09 DIAGNOSIS — Z992 Dependence on renal dialysis: Secondary | ICD-10-CM | POA: Diagnosis not present

## 2018-11-09 DIAGNOSIS — N186 End stage renal disease: Secondary | ICD-10-CM | POA: Diagnosis not present

## 2018-11-12 DIAGNOSIS — Z992 Dependence on renal dialysis: Secondary | ICD-10-CM | POA: Diagnosis not present

## 2018-11-12 DIAGNOSIS — D631 Anemia in chronic kidney disease: Secondary | ICD-10-CM | POA: Diagnosis not present

## 2018-11-12 DIAGNOSIS — N186 End stage renal disease: Secondary | ICD-10-CM | POA: Diagnosis not present

## 2018-11-12 DIAGNOSIS — N2581 Secondary hyperparathyroidism of renal origin: Secondary | ICD-10-CM | POA: Diagnosis not present

## 2018-11-13 DIAGNOSIS — I129 Hypertensive chronic kidney disease with stage 1 through stage 4 chronic kidney disease, or unspecified chronic kidney disease: Secondary | ICD-10-CM | POA: Diagnosis not present

## 2018-11-13 DIAGNOSIS — N186 End stage renal disease: Secondary | ICD-10-CM | POA: Diagnosis not present

## 2018-11-13 DIAGNOSIS — Z992 Dependence on renal dialysis: Secondary | ICD-10-CM | POA: Diagnosis not present

## 2018-11-14 DIAGNOSIS — N186 End stage renal disease: Secondary | ICD-10-CM | POA: Diagnosis not present

## 2018-11-14 DIAGNOSIS — D631 Anemia in chronic kidney disease: Secondary | ICD-10-CM | POA: Diagnosis not present

## 2018-11-14 DIAGNOSIS — Z992 Dependence on renal dialysis: Secondary | ICD-10-CM | POA: Diagnosis not present

## 2018-11-14 DIAGNOSIS — N2581 Secondary hyperparathyroidism of renal origin: Secondary | ICD-10-CM | POA: Diagnosis not present

## 2018-11-14 DIAGNOSIS — Z23 Encounter for immunization: Secondary | ICD-10-CM | POA: Diagnosis not present

## 2018-11-16 DIAGNOSIS — Z992 Dependence on renal dialysis: Secondary | ICD-10-CM | POA: Diagnosis not present

## 2018-11-16 DIAGNOSIS — N186 End stage renal disease: Secondary | ICD-10-CM | POA: Diagnosis not present

## 2018-11-16 DIAGNOSIS — N2581 Secondary hyperparathyroidism of renal origin: Secondary | ICD-10-CM | POA: Diagnosis not present

## 2018-11-16 DIAGNOSIS — D631 Anemia in chronic kidney disease: Secondary | ICD-10-CM | POA: Diagnosis not present

## 2018-11-16 DIAGNOSIS — Z23 Encounter for immunization: Secondary | ICD-10-CM | POA: Diagnosis not present

## 2018-11-19 DIAGNOSIS — N2581 Secondary hyperparathyroidism of renal origin: Secondary | ICD-10-CM | POA: Diagnosis not present

## 2018-11-19 DIAGNOSIS — Z992 Dependence on renal dialysis: Secondary | ICD-10-CM | POA: Diagnosis not present

## 2018-11-19 DIAGNOSIS — Z23 Encounter for immunization: Secondary | ICD-10-CM | POA: Diagnosis not present

## 2018-11-19 DIAGNOSIS — D631 Anemia in chronic kidney disease: Secondary | ICD-10-CM | POA: Diagnosis not present

## 2018-11-19 DIAGNOSIS — N186 End stage renal disease: Secondary | ICD-10-CM | POA: Diagnosis not present

## 2018-11-21 DIAGNOSIS — Z23 Encounter for immunization: Secondary | ICD-10-CM | POA: Diagnosis not present

## 2018-11-21 DIAGNOSIS — N186 End stage renal disease: Secondary | ICD-10-CM | POA: Diagnosis not present

## 2018-11-21 DIAGNOSIS — Z992 Dependence on renal dialysis: Secondary | ICD-10-CM | POA: Diagnosis not present

## 2018-11-21 DIAGNOSIS — D631 Anemia in chronic kidney disease: Secondary | ICD-10-CM | POA: Diagnosis not present

## 2018-11-21 DIAGNOSIS — N2581 Secondary hyperparathyroidism of renal origin: Secondary | ICD-10-CM | POA: Diagnosis not present

## 2018-11-23 DIAGNOSIS — N186 End stage renal disease: Secondary | ICD-10-CM | POA: Diagnosis not present

## 2018-11-23 DIAGNOSIS — Z992 Dependence on renal dialysis: Secondary | ICD-10-CM | POA: Diagnosis not present

## 2018-11-23 DIAGNOSIS — N2581 Secondary hyperparathyroidism of renal origin: Secondary | ICD-10-CM | POA: Diagnosis not present

## 2018-11-23 DIAGNOSIS — D631 Anemia in chronic kidney disease: Secondary | ICD-10-CM | POA: Diagnosis not present

## 2018-11-23 DIAGNOSIS — Z23 Encounter for immunization: Secondary | ICD-10-CM | POA: Diagnosis not present

## 2018-11-26 DIAGNOSIS — Z23 Encounter for immunization: Secondary | ICD-10-CM | POA: Diagnosis not present

## 2018-11-26 DIAGNOSIS — D631 Anemia in chronic kidney disease: Secondary | ICD-10-CM | POA: Diagnosis not present

## 2018-11-26 DIAGNOSIS — N186 End stage renal disease: Secondary | ICD-10-CM | POA: Diagnosis not present

## 2018-11-26 DIAGNOSIS — Z992 Dependence on renal dialysis: Secondary | ICD-10-CM | POA: Diagnosis not present

## 2018-11-26 DIAGNOSIS — N2581 Secondary hyperparathyroidism of renal origin: Secondary | ICD-10-CM | POA: Diagnosis not present

## 2018-11-28 DIAGNOSIS — N186 End stage renal disease: Secondary | ICD-10-CM | POA: Diagnosis not present

## 2018-11-28 DIAGNOSIS — D631 Anemia in chronic kidney disease: Secondary | ICD-10-CM | POA: Diagnosis not present

## 2018-11-28 DIAGNOSIS — Z23 Encounter for immunization: Secondary | ICD-10-CM | POA: Diagnosis not present

## 2018-11-28 DIAGNOSIS — N2581 Secondary hyperparathyroidism of renal origin: Secondary | ICD-10-CM | POA: Diagnosis not present

## 2018-11-28 DIAGNOSIS — Z992 Dependence on renal dialysis: Secondary | ICD-10-CM | POA: Diagnosis not present

## 2018-11-30 DIAGNOSIS — D631 Anemia in chronic kidney disease: Secondary | ICD-10-CM | POA: Diagnosis not present

## 2018-11-30 DIAGNOSIS — N2581 Secondary hyperparathyroidism of renal origin: Secondary | ICD-10-CM | POA: Diagnosis not present

## 2018-11-30 DIAGNOSIS — Z23 Encounter for immunization: Secondary | ICD-10-CM | POA: Diagnosis not present

## 2018-11-30 DIAGNOSIS — Z992 Dependence on renal dialysis: Secondary | ICD-10-CM | POA: Diagnosis not present

## 2018-11-30 DIAGNOSIS — N186 End stage renal disease: Secondary | ICD-10-CM | POA: Diagnosis not present

## 2018-12-03 DIAGNOSIS — N2581 Secondary hyperparathyroidism of renal origin: Secondary | ICD-10-CM | POA: Diagnosis not present

## 2018-12-03 DIAGNOSIS — N186 End stage renal disease: Secondary | ICD-10-CM | POA: Diagnosis not present

## 2018-12-03 DIAGNOSIS — Z23 Encounter for immunization: Secondary | ICD-10-CM | POA: Diagnosis not present

## 2018-12-03 DIAGNOSIS — Z992 Dependence on renal dialysis: Secondary | ICD-10-CM | POA: Diagnosis not present

## 2018-12-03 DIAGNOSIS — D631 Anemia in chronic kidney disease: Secondary | ICD-10-CM | POA: Diagnosis not present

## 2018-12-05 DIAGNOSIS — Z992 Dependence on renal dialysis: Secondary | ICD-10-CM | POA: Diagnosis not present

## 2018-12-05 DIAGNOSIS — N186 End stage renal disease: Secondary | ICD-10-CM | POA: Diagnosis not present

## 2018-12-05 DIAGNOSIS — D631 Anemia in chronic kidney disease: Secondary | ICD-10-CM | POA: Diagnosis not present

## 2018-12-05 DIAGNOSIS — Z23 Encounter for immunization: Secondary | ICD-10-CM | POA: Diagnosis not present

## 2018-12-05 DIAGNOSIS — N2581 Secondary hyperparathyroidism of renal origin: Secondary | ICD-10-CM | POA: Diagnosis not present

## 2018-12-07 DIAGNOSIS — N2581 Secondary hyperparathyroidism of renal origin: Secondary | ICD-10-CM | POA: Diagnosis not present

## 2018-12-07 DIAGNOSIS — Z992 Dependence on renal dialysis: Secondary | ICD-10-CM | POA: Diagnosis not present

## 2018-12-07 DIAGNOSIS — D631 Anemia in chronic kidney disease: Secondary | ICD-10-CM | POA: Diagnosis not present

## 2018-12-07 DIAGNOSIS — N186 End stage renal disease: Secondary | ICD-10-CM | POA: Diagnosis not present

## 2018-12-07 DIAGNOSIS — Z23 Encounter for immunization: Secondary | ICD-10-CM | POA: Diagnosis not present

## 2018-12-10 DIAGNOSIS — N186 End stage renal disease: Secondary | ICD-10-CM | POA: Diagnosis not present

## 2018-12-10 DIAGNOSIS — N2581 Secondary hyperparathyroidism of renal origin: Secondary | ICD-10-CM | POA: Diagnosis not present

## 2018-12-10 DIAGNOSIS — D631 Anemia in chronic kidney disease: Secondary | ICD-10-CM | POA: Diagnosis not present

## 2018-12-10 DIAGNOSIS — Z23 Encounter for immunization: Secondary | ICD-10-CM | POA: Diagnosis not present

## 2018-12-10 DIAGNOSIS — Z992 Dependence on renal dialysis: Secondary | ICD-10-CM | POA: Diagnosis not present

## 2018-12-12 DIAGNOSIS — D631 Anemia in chronic kidney disease: Secondary | ICD-10-CM | POA: Diagnosis not present

## 2018-12-12 DIAGNOSIS — Z23 Encounter for immunization: Secondary | ICD-10-CM | POA: Diagnosis not present

## 2018-12-12 DIAGNOSIS — N2581 Secondary hyperparathyroidism of renal origin: Secondary | ICD-10-CM | POA: Diagnosis not present

## 2018-12-12 DIAGNOSIS — Z992 Dependence on renal dialysis: Secondary | ICD-10-CM | POA: Diagnosis not present

## 2018-12-12 DIAGNOSIS — N186 End stage renal disease: Secondary | ICD-10-CM | POA: Diagnosis not present

## 2018-12-13 DIAGNOSIS — I129 Hypertensive chronic kidney disease with stage 1 through stage 4 chronic kidney disease, or unspecified chronic kidney disease: Secondary | ICD-10-CM | POA: Diagnosis not present

## 2018-12-13 DIAGNOSIS — N186 End stage renal disease: Secondary | ICD-10-CM | POA: Diagnosis not present

## 2018-12-13 DIAGNOSIS — Z992 Dependence on renal dialysis: Secondary | ICD-10-CM | POA: Diagnosis not present

## 2018-12-14 DIAGNOSIS — D631 Anemia in chronic kidney disease: Secondary | ICD-10-CM | POA: Diagnosis not present

## 2018-12-14 DIAGNOSIS — N186 End stage renal disease: Secondary | ICD-10-CM | POA: Diagnosis not present

## 2018-12-14 DIAGNOSIS — N2581 Secondary hyperparathyroidism of renal origin: Secondary | ICD-10-CM | POA: Diagnosis not present

## 2018-12-14 DIAGNOSIS — Z992 Dependence on renal dialysis: Secondary | ICD-10-CM | POA: Diagnosis not present

## 2018-12-17 DIAGNOSIS — N2581 Secondary hyperparathyroidism of renal origin: Secondary | ICD-10-CM | POA: Diagnosis not present

## 2018-12-17 DIAGNOSIS — D631 Anemia in chronic kidney disease: Secondary | ICD-10-CM | POA: Diagnosis not present

## 2018-12-17 DIAGNOSIS — Z992 Dependence on renal dialysis: Secondary | ICD-10-CM | POA: Diagnosis not present

## 2018-12-17 DIAGNOSIS — N186 End stage renal disease: Secondary | ICD-10-CM | POA: Diagnosis not present

## 2018-12-19 DIAGNOSIS — N186 End stage renal disease: Secondary | ICD-10-CM | POA: Diagnosis not present

## 2018-12-19 DIAGNOSIS — N2581 Secondary hyperparathyroidism of renal origin: Secondary | ICD-10-CM | POA: Diagnosis not present

## 2018-12-19 DIAGNOSIS — D631 Anemia in chronic kidney disease: Secondary | ICD-10-CM | POA: Diagnosis not present

## 2018-12-19 DIAGNOSIS — Z992 Dependence on renal dialysis: Secondary | ICD-10-CM | POA: Diagnosis not present

## 2018-12-21 DIAGNOSIS — N186 End stage renal disease: Secondary | ICD-10-CM | POA: Diagnosis not present

## 2018-12-21 DIAGNOSIS — D631 Anemia in chronic kidney disease: Secondary | ICD-10-CM | POA: Diagnosis not present

## 2018-12-21 DIAGNOSIS — Z992 Dependence on renal dialysis: Secondary | ICD-10-CM | POA: Diagnosis not present

## 2018-12-21 DIAGNOSIS — N2581 Secondary hyperparathyroidism of renal origin: Secondary | ICD-10-CM | POA: Diagnosis not present

## 2018-12-24 DIAGNOSIS — N2581 Secondary hyperparathyroidism of renal origin: Secondary | ICD-10-CM | POA: Diagnosis not present

## 2018-12-24 DIAGNOSIS — Z992 Dependence on renal dialysis: Secondary | ICD-10-CM | POA: Diagnosis not present

## 2018-12-24 DIAGNOSIS — D631 Anemia in chronic kidney disease: Secondary | ICD-10-CM | POA: Diagnosis not present

## 2018-12-24 DIAGNOSIS — N186 End stage renal disease: Secondary | ICD-10-CM | POA: Diagnosis not present

## 2018-12-26 DIAGNOSIS — Z992 Dependence on renal dialysis: Secondary | ICD-10-CM | POA: Diagnosis not present

## 2018-12-26 DIAGNOSIS — N2581 Secondary hyperparathyroidism of renal origin: Secondary | ICD-10-CM | POA: Diagnosis not present

## 2018-12-26 DIAGNOSIS — D631 Anemia in chronic kidney disease: Secondary | ICD-10-CM | POA: Diagnosis not present

## 2018-12-26 DIAGNOSIS — N186 End stage renal disease: Secondary | ICD-10-CM | POA: Diagnosis not present

## 2018-12-28 DIAGNOSIS — D631 Anemia in chronic kidney disease: Secondary | ICD-10-CM | POA: Diagnosis not present

## 2018-12-28 DIAGNOSIS — N2581 Secondary hyperparathyroidism of renal origin: Secondary | ICD-10-CM | POA: Diagnosis not present

## 2018-12-28 DIAGNOSIS — Z992 Dependence on renal dialysis: Secondary | ICD-10-CM | POA: Diagnosis not present

## 2018-12-28 DIAGNOSIS — N186 End stage renal disease: Secondary | ICD-10-CM | POA: Diagnosis not present

## 2018-12-31 ENCOUNTER — Telehealth: Payer: Self-pay | Admitting: Podiatry

## 2018-12-31 DIAGNOSIS — N186 End stage renal disease: Secondary | ICD-10-CM | POA: Diagnosis not present

## 2018-12-31 DIAGNOSIS — D631 Anemia in chronic kidney disease: Secondary | ICD-10-CM | POA: Diagnosis not present

## 2018-12-31 DIAGNOSIS — N2581 Secondary hyperparathyroidism of renal origin: Secondary | ICD-10-CM | POA: Diagnosis not present

## 2018-12-31 DIAGNOSIS — B353 Tinea pedis: Secondary | ICD-10-CM

## 2018-12-31 DIAGNOSIS — Z992 Dependence on renal dialysis: Secondary | ICD-10-CM | POA: Diagnosis not present

## 2018-12-31 MED ORDER — CLOTRIMAZOLE 1 % EX CREA
TOPICAL_CREAM | CUTANEOUS | 1 refills | Status: DC
Start: 2018-12-31 — End: 2019-03-27

## 2018-12-31 NOTE — Telephone Encounter (Signed)
I called pt and asked if his pharmacy was [facility] in [place] on [address] and he said it was. I told him the medication had been sent on the day he was seen and confirmed received by the pharmacy, but I would send it again.

## 2018-12-31 NOTE — Addendum Note (Signed)
Addended by: Harriett Sine D on: 12/31/2018 12:02 PM   Modules accepted: Orders

## 2018-12-31 NOTE — Telephone Encounter (Signed)
Pt was seen on 10/16/18 and was supposed to have a medicated cream for nail fungus sent to the pharmacy but he has not heard anything. Pt calling to follow up.

## 2019-01-02 DIAGNOSIS — D631 Anemia in chronic kidney disease: Secondary | ICD-10-CM | POA: Diagnosis not present

## 2019-01-02 DIAGNOSIS — Z992 Dependence on renal dialysis: Secondary | ICD-10-CM | POA: Diagnosis not present

## 2019-01-02 DIAGNOSIS — N186 End stage renal disease: Secondary | ICD-10-CM | POA: Diagnosis not present

## 2019-01-02 DIAGNOSIS — N2581 Secondary hyperparathyroidism of renal origin: Secondary | ICD-10-CM | POA: Diagnosis not present

## 2019-01-04 DIAGNOSIS — D631 Anemia in chronic kidney disease: Secondary | ICD-10-CM | POA: Diagnosis not present

## 2019-01-04 DIAGNOSIS — N2581 Secondary hyperparathyroidism of renal origin: Secondary | ICD-10-CM | POA: Diagnosis not present

## 2019-01-04 DIAGNOSIS — Z992 Dependence on renal dialysis: Secondary | ICD-10-CM | POA: Diagnosis not present

## 2019-01-04 DIAGNOSIS — N186 End stage renal disease: Secondary | ICD-10-CM | POA: Diagnosis not present

## 2019-01-07 DIAGNOSIS — N2581 Secondary hyperparathyroidism of renal origin: Secondary | ICD-10-CM | POA: Diagnosis not present

## 2019-01-07 DIAGNOSIS — D631 Anemia in chronic kidney disease: Secondary | ICD-10-CM | POA: Diagnosis not present

## 2019-01-07 DIAGNOSIS — N186 End stage renal disease: Secondary | ICD-10-CM | POA: Diagnosis not present

## 2019-01-07 DIAGNOSIS — Z992 Dependence on renal dialysis: Secondary | ICD-10-CM | POA: Diagnosis not present

## 2019-01-09 DIAGNOSIS — N186 End stage renal disease: Secondary | ICD-10-CM | POA: Diagnosis not present

## 2019-01-09 DIAGNOSIS — D631 Anemia in chronic kidney disease: Secondary | ICD-10-CM | POA: Diagnosis not present

## 2019-01-09 DIAGNOSIS — Z992 Dependence on renal dialysis: Secondary | ICD-10-CM | POA: Diagnosis not present

## 2019-01-09 DIAGNOSIS — N2581 Secondary hyperparathyroidism of renal origin: Secondary | ICD-10-CM | POA: Diagnosis not present

## 2019-01-11 DIAGNOSIS — N186 End stage renal disease: Secondary | ICD-10-CM | POA: Diagnosis not present

## 2019-01-11 DIAGNOSIS — Z992 Dependence on renal dialysis: Secondary | ICD-10-CM | POA: Diagnosis not present

## 2019-01-11 DIAGNOSIS — N2581 Secondary hyperparathyroidism of renal origin: Secondary | ICD-10-CM | POA: Diagnosis not present

## 2019-01-11 DIAGNOSIS — D631 Anemia in chronic kidney disease: Secondary | ICD-10-CM | POA: Diagnosis not present

## 2019-01-13 DIAGNOSIS — Z992 Dependence on renal dialysis: Secondary | ICD-10-CM | POA: Diagnosis not present

## 2019-01-13 DIAGNOSIS — N186 End stage renal disease: Secondary | ICD-10-CM | POA: Diagnosis not present

## 2019-01-13 DIAGNOSIS — I129 Hypertensive chronic kidney disease with stage 1 through stage 4 chronic kidney disease, or unspecified chronic kidney disease: Secondary | ICD-10-CM | POA: Diagnosis not present

## 2019-01-14 DIAGNOSIS — N186 End stage renal disease: Secondary | ICD-10-CM | POA: Diagnosis not present

## 2019-01-14 DIAGNOSIS — N2581 Secondary hyperparathyroidism of renal origin: Secondary | ICD-10-CM | POA: Diagnosis not present

## 2019-01-14 DIAGNOSIS — Z992 Dependence on renal dialysis: Secondary | ICD-10-CM | POA: Diagnosis not present

## 2019-01-16 ENCOUNTER — Ambulatory Visit: Payer: Medicare Other | Admitting: Podiatry

## 2019-01-16 DIAGNOSIS — Z992 Dependence on renal dialysis: Secondary | ICD-10-CM | POA: Diagnosis not present

## 2019-01-16 DIAGNOSIS — N186 End stage renal disease: Secondary | ICD-10-CM | POA: Diagnosis not present

## 2019-01-16 DIAGNOSIS — N2581 Secondary hyperparathyroidism of renal origin: Secondary | ICD-10-CM | POA: Diagnosis not present

## 2019-01-18 DIAGNOSIS — Z992 Dependence on renal dialysis: Secondary | ICD-10-CM | POA: Diagnosis not present

## 2019-01-18 DIAGNOSIS — N2581 Secondary hyperparathyroidism of renal origin: Secondary | ICD-10-CM | POA: Diagnosis not present

## 2019-01-18 DIAGNOSIS — N186 End stage renal disease: Secondary | ICD-10-CM | POA: Diagnosis not present

## 2019-01-21 DIAGNOSIS — N186 End stage renal disease: Secondary | ICD-10-CM | POA: Diagnosis not present

## 2019-01-21 DIAGNOSIS — Z992 Dependence on renal dialysis: Secondary | ICD-10-CM | POA: Diagnosis not present

## 2019-01-21 DIAGNOSIS — N2581 Secondary hyperparathyroidism of renal origin: Secondary | ICD-10-CM | POA: Diagnosis not present

## 2019-01-23 DIAGNOSIS — N2581 Secondary hyperparathyroidism of renal origin: Secondary | ICD-10-CM | POA: Diagnosis not present

## 2019-01-23 DIAGNOSIS — N186 End stage renal disease: Secondary | ICD-10-CM | POA: Diagnosis not present

## 2019-01-23 DIAGNOSIS — Z992 Dependence on renal dialysis: Secondary | ICD-10-CM | POA: Diagnosis not present

## 2019-01-25 DIAGNOSIS — Z992 Dependence on renal dialysis: Secondary | ICD-10-CM | POA: Diagnosis not present

## 2019-01-25 DIAGNOSIS — N186 End stage renal disease: Secondary | ICD-10-CM | POA: Diagnosis not present

## 2019-01-25 DIAGNOSIS — N2581 Secondary hyperparathyroidism of renal origin: Secondary | ICD-10-CM | POA: Diagnosis not present

## 2019-01-28 DIAGNOSIS — Z992 Dependence on renal dialysis: Secondary | ICD-10-CM | POA: Diagnosis not present

## 2019-01-28 DIAGNOSIS — N2581 Secondary hyperparathyroidism of renal origin: Secondary | ICD-10-CM | POA: Diagnosis not present

## 2019-01-28 DIAGNOSIS — N186 End stage renal disease: Secondary | ICD-10-CM | POA: Diagnosis not present

## 2019-01-30 DIAGNOSIS — Z992 Dependence on renal dialysis: Secondary | ICD-10-CM | POA: Diagnosis not present

## 2019-01-30 DIAGNOSIS — N2581 Secondary hyperparathyroidism of renal origin: Secondary | ICD-10-CM | POA: Diagnosis not present

## 2019-01-30 DIAGNOSIS — N186 End stage renal disease: Secondary | ICD-10-CM | POA: Diagnosis not present

## 2019-02-01 DIAGNOSIS — Z992 Dependence on renal dialysis: Secondary | ICD-10-CM | POA: Diagnosis not present

## 2019-02-01 DIAGNOSIS — N186 End stage renal disease: Secondary | ICD-10-CM | POA: Diagnosis not present

## 2019-02-01 DIAGNOSIS — N2581 Secondary hyperparathyroidism of renal origin: Secondary | ICD-10-CM | POA: Diagnosis not present

## 2019-02-03 DIAGNOSIS — Z992 Dependence on renal dialysis: Secondary | ICD-10-CM | POA: Diagnosis not present

## 2019-02-03 DIAGNOSIS — N2581 Secondary hyperparathyroidism of renal origin: Secondary | ICD-10-CM | POA: Diagnosis not present

## 2019-02-03 DIAGNOSIS — N186 End stage renal disease: Secondary | ICD-10-CM | POA: Diagnosis not present

## 2019-02-05 DIAGNOSIS — N186 End stage renal disease: Secondary | ICD-10-CM | POA: Diagnosis not present

## 2019-02-05 DIAGNOSIS — Z992 Dependence on renal dialysis: Secondary | ICD-10-CM | POA: Diagnosis not present

## 2019-02-05 DIAGNOSIS — N2581 Secondary hyperparathyroidism of renal origin: Secondary | ICD-10-CM | POA: Diagnosis not present

## 2019-02-08 DIAGNOSIS — N186 End stage renal disease: Secondary | ICD-10-CM | POA: Diagnosis not present

## 2019-02-08 DIAGNOSIS — N2581 Secondary hyperparathyroidism of renal origin: Secondary | ICD-10-CM | POA: Diagnosis not present

## 2019-02-08 DIAGNOSIS — Z992 Dependence on renal dialysis: Secondary | ICD-10-CM | POA: Diagnosis not present

## 2019-02-11 DIAGNOSIS — Z992 Dependence on renal dialysis: Secondary | ICD-10-CM | POA: Diagnosis not present

## 2019-02-11 DIAGNOSIS — N2581 Secondary hyperparathyroidism of renal origin: Secondary | ICD-10-CM | POA: Diagnosis not present

## 2019-02-11 DIAGNOSIS — N186 End stage renal disease: Secondary | ICD-10-CM | POA: Diagnosis not present

## 2019-03-15 DIAGNOSIS — Z992 Dependence on renal dialysis: Secondary | ICD-10-CM | POA: Diagnosis not present

## 2019-03-15 DIAGNOSIS — I129 Hypertensive chronic kidney disease with stage 1 through stage 4 chronic kidney disease, or unspecified chronic kidney disease: Secondary | ICD-10-CM | POA: Diagnosis not present

## 2019-03-15 DIAGNOSIS — N186 End stage renal disease: Secondary | ICD-10-CM | POA: Diagnosis not present

## 2019-03-16 DIAGNOSIS — D631 Anemia in chronic kidney disease: Secondary | ICD-10-CM | POA: Diagnosis not present

## 2019-03-16 DIAGNOSIS — N186 End stage renal disease: Secondary | ICD-10-CM | POA: Diagnosis not present

## 2019-03-16 DIAGNOSIS — N2581 Secondary hyperparathyroidism of renal origin: Secondary | ICD-10-CM | POA: Diagnosis not present

## 2019-03-16 DIAGNOSIS — Z992 Dependence on renal dialysis: Secondary | ICD-10-CM | POA: Diagnosis not present

## 2019-03-18 DIAGNOSIS — D631 Anemia in chronic kidney disease: Secondary | ICD-10-CM | POA: Diagnosis not present

## 2019-03-18 DIAGNOSIS — N2581 Secondary hyperparathyroidism of renal origin: Secondary | ICD-10-CM | POA: Diagnosis not present

## 2019-03-18 DIAGNOSIS — N186 End stage renal disease: Secondary | ICD-10-CM | POA: Diagnosis not present

## 2019-03-18 DIAGNOSIS — Z992 Dependence on renal dialysis: Secondary | ICD-10-CM | POA: Diagnosis not present

## 2019-03-19 ENCOUNTER — Encounter: Payer: Self-pay | Admitting: Family Medicine

## 2019-03-19 ENCOUNTER — Other Ambulatory Visit: Payer: Self-pay

## 2019-03-19 ENCOUNTER — Ambulatory Visit (INDEPENDENT_AMBULATORY_CARE_PROVIDER_SITE_OTHER): Payer: Medicare Other | Admitting: Family Medicine

## 2019-03-19 VITALS — BP 102/58 | HR 94 | Temp 97.2°F | Ht 70.75 in | Wt 183.3 lb

## 2019-03-19 DIAGNOSIS — Z992 Dependence on renal dialysis: Secondary | ICD-10-CM

## 2019-03-19 DIAGNOSIS — N186 End stage renal disease: Secondary | ICD-10-CM

## 2019-03-19 DIAGNOSIS — M7021 Olecranon bursitis, right elbow: Secondary | ICD-10-CM

## 2019-03-19 MED ORDER — AURYXIA 1 GM 210 MG(FE) PO TABS: 630.0000 mg | ORAL_TABLET | Freq: Three times a day (TID) | ORAL | Status: AC

## 2019-03-19 NOTE — Progress Notes (Signed)
This visit occurred during the SARS-CoV-2 public health emergency.  Safety protocols were in place, including screening questions prior to the visit, additional usage of staff PPE, and extensive cleaning of exam room while observing appropriate contact time as indicated for disinfecting solutions.  The patient was asking about a referral to the renal clinic that would be closer.  I need to talk with his PCP about this.  HD site on R forearm.  Separate issue with olecranon swelling.  Noted about 2-3 weeks ago, not painful, feels soft. He may have bumped his elbow prior but he does not recall significant trauma otherwise.  Normal range of motion.  He was asking about multiorgan vascular disease screening (brain, cardiac, liver, etc.).  No h/o MI/CVA.  HTN led to renal disease. No CP, not SOB.  No BLE edema.  He referred to it as a "presidential physical".  I told him have never heard of such an exam.  We may not have all of his old records from the renal clinic and I was unable to review the entirety of his old chart at the office visit as it was only an acute visit regarding his elbow.  I also need to talk to his PCP.  I did not put in any screening orders.  Meds, vitals, and allergies reviewed.   ROS: Per HPI unless specifically indicated in ROS section   nad ncat Neck supple, no LA rrr ctab HD site intact R forearm with thrill as expected.  No spreading erythema. Normal range of motion at the right elbow.  He has nontender olecranon bursitis noted. No BLE edema.

## 2019-03-19 NOTE — Patient Instructions (Signed)
Try limit pressure on the area.  Try ice locally.  Update [place] as needed.  As long as you don't have a fever or spreading redness, then I would leave it alone.  Take care.  Glad to see you.

## 2019-03-20 DIAGNOSIS — N186 End stage renal disease: Secondary | ICD-10-CM | POA: Diagnosis not present

## 2019-03-20 DIAGNOSIS — N2581 Secondary hyperparathyroidism of renal origin: Secondary | ICD-10-CM | POA: Diagnosis not present

## 2019-03-20 DIAGNOSIS — Z992 Dependence on renal dialysis: Secondary | ICD-10-CM | POA: Diagnosis not present

## 2019-03-20 DIAGNOSIS — D631 Anemia in chronic kidney disease: Secondary | ICD-10-CM | POA: Diagnosis not present

## 2019-03-20 NOTE — Assessment & Plan Note (Signed)
No sign of infection.  Not tender.  Would not attempt aspiration for multiple reasons, discussed with patient.  Avoid trauma, use an elbow pad as long as it does not interfere with his hemodialysis site and update [place] as needed.  Icing may help.

## 2019-03-20 NOTE — Assessment & Plan Note (Signed)
I will route this to his PCP about potential renal referral.  I will defer to PCP about vascular screening otherwise.  See above.

## 2019-03-22 DIAGNOSIS — Z992 Dependence on renal dialysis: Secondary | ICD-10-CM | POA: Diagnosis not present

## 2019-03-22 DIAGNOSIS — N186 End stage renal disease: Secondary | ICD-10-CM | POA: Diagnosis not present

## 2019-03-22 DIAGNOSIS — D631 Anemia in chronic kidney disease: Secondary | ICD-10-CM | POA: Diagnosis not present

## 2019-03-22 DIAGNOSIS — N2581 Secondary hyperparathyroidism of renal origin: Secondary | ICD-10-CM | POA: Diagnosis not present

## 2019-03-25 DIAGNOSIS — N2581 Secondary hyperparathyroidism of renal origin: Secondary | ICD-10-CM | POA: Diagnosis not present

## 2019-03-25 DIAGNOSIS — Z992 Dependence on renal dialysis: Secondary | ICD-10-CM | POA: Diagnosis not present

## 2019-03-25 DIAGNOSIS — N186 End stage renal disease: Secondary | ICD-10-CM | POA: Diagnosis not present

## 2019-03-25 DIAGNOSIS — D631 Anemia in chronic kidney disease: Secondary | ICD-10-CM | POA: Diagnosis not present

## 2019-03-27 ENCOUNTER — Encounter: Payer: Self-pay | Admitting: Family Medicine

## 2019-03-27 ENCOUNTER — Other Ambulatory Visit: Payer: Self-pay

## 2019-03-27 ENCOUNTER — Ambulatory Visit (INDEPENDENT_AMBULATORY_CARE_PROVIDER_SITE_OTHER): Payer: Medicare Other | Admitting: Family Medicine

## 2019-03-27 VITALS — BP 88/50 | HR 72 | Temp 98.3°F | Ht 71.0 in | Wt 184.4 lb

## 2019-03-27 DIAGNOSIS — Z992 Dependence on renal dialysis: Secondary | ICD-10-CM | POA: Diagnosis not present

## 2019-03-27 DIAGNOSIS — D631 Anemia in chronic kidney disease: Secondary | ICD-10-CM

## 2019-03-27 DIAGNOSIS — N189 Chronic kidney disease, unspecified: Secondary | ICD-10-CM | POA: Diagnosis not present

## 2019-03-27 DIAGNOSIS — N2581 Secondary hyperparathyroidism of renal origin: Secondary | ICD-10-CM | POA: Diagnosis not present

## 2019-03-27 DIAGNOSIS — M7021 Olecranon bursitis, right elbow: Secondary | ICD-10-CM | POA: Diagnosis not present

## 2019-03-27 DIAGNOSIS — Z23 Encounter for immunization: Secondary | ICD-10-CM

## 2019-03-27 DIAGNOSIS — N186 End stage renal disease: Secondary | ICD-10-CM

## 2019-03-27 DIAGNOSIS — Z125 Encounter for screening for malignant neoplasm of prostate: Secondary | ICD-10-CM | POA: Diagnosis not present

## 2019-03-27 DIAGNOSIS — I10 Essential (primary) hypertension: Secondary | ICD-10-CM | POA: Diagnosis not present

## 2019-03-27 NOTE — Patient Instructions (Signed)
Good to see you today  Please stop your amlodipine, if blood pressure over 130/80 at dialysis, please let me know and I will send in lower dose of amlodipine.   Follow up with me in 6 months  You will get a call about a referral to new nephrologist

## 2019-03-27 NOTE — Progress Notes (Signed)
Subjective:    Patient ID: Dwayne Reed, male    DOB: May 11, 1955, 64 y.o.   MRN: [id number]  HPI Chief Complaint  Patient presents with  . Annual Exam    NO concerns    This is a 64 yo male who presents today for follow up of chronic medical conditions.   CRF on dialysis- continues to attend M/W/F. Marland Kitchen Had been seeing nephrologist in [facility] but wishes to see someone closer. He works at [employer], has been working from home.   Low blood pressure- noted at dialysis, patient currently taking amlodipine 10 mg. He has not been symptomatic.   Olecranon bursitis- was seen by Dr. Damita Dunnings 03/19/19. Swelling of left elbow, no known trauma. No pain. Had on right elbow in past and had eventual spontaneous resolution.   Past Medical History:  Diagnosis Date  . Anemia    low iron  . CHF (congestive heart failure) ([place])    pt denies  . COPD (chronic obstructive pulmonary disease) ([place])   . History of kidney stones   . Hypertension   . Kidney stones   . Pneumonia   . 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 EXTRACORPOREAL SHOCK WAVE LITHOTRIPSY    . LITHOTRIPSY     x 5  . PROSTATE BIOPSY     Family History  Problem Relation Age of Onset  . Heart disease Mother   . Sarcoidosis Sister   . Cancer Maternal Grandmother        type unknown   Social History   Tobacco Use  . Smoking status: Current Every Day Smoker    Packs/day: 0.15  . Smokeless tobacco: Never Used  Substance Use Topics  . Alcohol use: Yes    Comment: on holidays  . Drug use: Never       Review of Systems  Constitutional: Negative.   HENT: Negative.   Eyes: Negative.   Respiratory: Negative.   Cardiovascular: Negative.   Gastrointestinal: Negative.   Endocrine: Negative.   Genitourinary: Negative.   Musculoskeletal: Negative.   Skin: Negative.   Allergic/Immunologic: Negative.   Neurological: Negative.   Hematological: Negative.    Psychiatric/Behavioral: Negative.        Objective:   Physical Exam Vitals reviewed.  Constitutional:      General: He is not in acute distress.    Appearance: Normal appearance. He is normal weight. He is not ill-appearing, toxic-appearing or diaphoretic.  HENT:     Head: Normocephalic and atraumatic.     Right Ear: External ear normal.     Left Ear: External ear normal.  Eyes:     Conjunctiva/sclera: Conjunctivae normal.  Cardiovascular:     Rate and Rhythm: Normal rate and regular rhythm.     Heart sounds: Normal heart sounds.  Pulmonary:     Effort: Pulmonary effort is normal.     Breath sounds: Normal breath sounds.  Abdominal:     General: Abdomen is flat. There is no distension.     Palpations: Abdomen is soft. There is no mass.     Tenderness: There is no abdominal tenderness. There is no guarding or rebound.     Hernia: No hernia is present.  Musculoskeletal:        General: Swelling (right elbow, no erythema, non tender. ) present.     Right lower leg: No edema.     Left lower leg: No edema.  Skin:  General: Skin is warm and dry.     Comments: Right forearm fistula dressing dry and intact.   Neurological:     Mental Status: He is alert and oriented to person, place, and time.          BP (!) 88/50 (BP Location: Left Arm, Patient Position: Sitting, Cuff Size: Normal)   Pulse 72   Temp 98.3 F (36.8 C) (Temporal)   Ht 5\' 11"  (1.803 m)   Wt 184 lb 6.4 oz (83.6 kg)   SpO2 99%   BMI 25.72 kg/m  Depression screen [facility] 2/9 03/27/2019 10/23/2017  Decreased Interest 0 0  Down, Depressed, Hopeless 0 0  PHQ - 2 Score 0 0    Assessment & Plan:  1. Essential hypertension - BP running low, will stop amlodipine and he will continue to get blood pressure readings at dialysis, he will let me know if blood pressure gets high, may need lower dose - Lipid Panel - Comprehensive metabolic panel  2. ESRD on dialysis [facility]) - Ambulatory referral to Nephrology  3.  Anemia of renal disease - CBC with Differential  4. Screening for prostate cancer - PSA, Medicare  5. Need for pneumococcal vaccination - Pneumococcal conjugate vaccine 13-valent  6. Right olecranon bursitis - no s/s infection, unable to wear compression due to location of fistula. Reassured patient and instructed him to notify me if any s/s infection occur  - follow up in 6 months  This visit occurred during the SARS-CoV-2 public health emergency.  Safety protocols were in place, including screening questions prior to the visit, additional usage of staff PPE, and extensive cleaning of exam room while observing appropriate contact time as indicated for disinfecting solutions.    Clarene Reamer, FNP-BC  [facility] [employer] [facility]  03/30/2019 7:25 AM

## 2019-03-28 ENCOUNTER — Telehealth: Payer: Self-pay | Admitting: Radiology

## 2019-03-28 LAB — CBC WITH DIFFERENTIAL/PLATELET
Basophils Absolute: 0.1 10*3/uL (ref 0.0–0.1)
Basophils Relative: 1.1 % (ref 0.0–3.0)
Eosinophils Absolute: 0.4 10*3/uL (ref 0.0–0.7)
Eosinophils Relative: 7.4 % — ABNORMAL HIGH (ref 0.0–5.0)
HCT: 35 % — ABNORMAL LOW (ref 39.0–52.0)
Hemoglobin: 11.8 g/dL — ABNORMAL LOW (ref 13.0–17.0)
Lymphocytes Relative: 18.5 % (ref 12.0–46.0)
Lymphs Abs: 1 10*3/uL (ref 0.7–4.0)
MCHC: 33.6 g/dL (ref 30.0–36.0)
MCV: 98.1 fl (ref 78.0–100.0)
Monocytes Absolute: 0.7 10*3/uL (ref 0.1–1.0)
Monocytes Relative: 13.3 % — ABNORMAL HIGH (ref 3.0–12.0)
Neutro Abs: 3.1 10*3/uL (ref 1.4–7.7)
Neutrophils Relative %: 59.7 % (ref 43.0–77.0)
Platelets: 181 10*3/uL (ref 150.0–400.0)
RBC: 3.57 Mil/uL — ABNORMAL LOW (ref 4.22–5.81)
RDW: 15.4 % (ref 11.5–15.5)
WBC: 5.2 10*3/uL (ref 4.0–10.5)

## 2019-03-28 LAB — LIPID PANEL
Cholesterol: 225 mg/dL — ABNORMAL HIGH (ref 0–200)
HDL: 47.2 mg/dL (ref >39.00)
NonHDL: 177.37
Total CHOL/HDL Ratio: 5
Triglycerides: 203 mg/dL — ABNORMAL HIGH (ref 0.0–149.0)
VLDL: 40.6 mg/dL — ABNORMAL HIGH (ref 0.0–40.0)

## 2019-03-28 LAB — COMPREHENSIVE METABOLIC PANEL
ALT: 12 U/L (ref 0–53)
AST: 10 U/L (ref 0–37)
Albumin: 4.5 g/dL (ref 3.5–5.2)
Alkaline Phosphatase: 69 U/L (ref 39–117)
BUN: 23 mg/dL (ref 6–23)
CO2: 37 mEq/L — ABNORMAL HIGH (ref 19–32)
Calcium: 8.4 mg/dL (ref 8.4–10.5)
Chloride: 90 mEq/L — ABNORMAL LOW (ref 96–112)
Creatinine, Ser: 7.09 mg/dL (ref 0.40–1.50)
GFR: 9.47 mL/min — CL (ref >60.00)
Glucose, Bld: 71 mg/dL (ref 70–99)
Potassium: 3.8 mEq/L (ref 3.5–5.1)
Sodium: 137 mEq/L (ref 135–145)
Total Bilirubin: 0.7 mg/dL (ref 0.2–1.2)
Total Protein: 7.5 g/dL (ref 6.0–8.3)

## 2019-03-28 LAB — LDL CHOLESTEROL, DIRECT: Direct LDL: 152 mg/dL

## 2019-03-28 LAB — PSA, MEDICARE: PSA: 10.56 ng/ml — ABNORMAL HIGH (ref 0.10–4.00)

## 2019-03-28 NOTE — Telephone Encounter (Signed)
Noted. Patient with ESRD on dialysis.

## 2019-03-28 NOTE — Telephone Encounter (Signed)
[facility] lab called critical results, CRT - 7.09, GFR - 9.46. Results sent to D. Carlean Purl

## 2019-03-29 DIAGNOSIS — D631 Anemia in chronic kidney disease: Secondary | ICD-10-CM | POA: Diagnosis not present

## 2019-03-29 DIAGNOSIS — N186 End stage renal disease: Secondary | ICD-10-CM | POA: Diagnosis not present

## 2019-03-29 DIAGNOSIS — N2581 Secondary hyperparathyroidism of renal origin: Secondary | ICD-10-CM | POA: Diagnosis not present

## 2019-03-29 DIAGNOSIS — Z992 Dependence on renal dialysis: Secondary | ICD-10-CM | POA: Diagnosis not present

## 2019-03-30 ENCOUNTER — Encounter: Payer: Self-pay | Admitting: Family Medicine

## 2019-04-01 ENCOUNTER — Other Ambulatory Visit: Payer: Self-pay | Admitting: Family Medicine

## 2019-04-01 DIAGNOSIS — E7800 Pure hypercholesterolemia, unspecified: Secondary | ICD-10-CM

## 2019-04-01 DIAGNOSIS — Z992 Dependence on renal dialysis: Secondary | ICD-10-CM | POA: Diagnosis not present

## 2019-04-01 DIAGNOSIS — Z8546 Personal history of malignant neoplasm of prostate: Secondary | ICD-10-CM

## 2019-04-01 DIAGNOSIS — N186 End stage renal disease: Secondary | ICD-10-CM | POA: Diagnosis not present

## 2019-04-01 DIAGNOSIS — D631 Anemia in chronic kidney disease: Secondary | ICD-10-CM | POA: Diagnosis not present

## 2019-04-01 DIAGNOSIS — N2581 Secondary hyperparathyroidism of renal origin: Secondary | ICD-10-CM | POA: Diagnosis not present

## 2019-04-01 DIAGNOSIS — R972 Elevated prostate specific antigen [PSA]: Secondary | ICD-10-CM

## 2019-04-01 MED ORDER — ATORVASTATIN CALCIUM 20 MG PO TABS: 20.0000 mg | ORAL_TABLET | Freq: Every day | ORAL | 3 refills | Status: DC

## 2019-04-03 DIAGNOSIS — Z992 Dependence on renal dialysis: Secondary | ICD-10-CM | POA: Diagnosis not present

## 2019-04-03 DIAGNOSIS — N2581 Secondary hyperparathyroidism of renal origin: Secondary | ICD-10-CM | POA: Diagnosis not present

## 2019-04-03 DIAGNOSIS — D631 Anemia in chronic kidney disease: Secondary | ICD-10-CM | POA: Diagnosis not present

## 2019-04-03 DIAGNOSIS — N186 End stage renal disease: Secondary | ICD-10-CM | POA: Diagnosis not present

## 2019-04-05 DIAGNOSIS — N2581 Secondary hyperparathyroidism of renal origin: Secondary | ICD-10-CM | POA: Diagnosis not present

## 2019-04-05 DIAGNOSIS — D631 Anemia in chronic kidney disease: Secondary | ICD-10-CM | POA: Diagnosis not present

## 2019-04-05 DIAGNOSIS — Z992 Dependence on renal dialysis: Secondary | ICD-10-CM | POA: Diagnosis not present

## 2019-04-05 DIAGNOSIS — N186 End stage renal disease: Secondary | ICD-10-CM | POA: Diagnosis not present

## 2019-04-08 DIAGNOSIS — N186 End stage renal disease: Secondary | ICD-10-CM | POA: Diagnosis not present

## 2019-04-08 DIAGNOSIS — D631 Anemia in chronic kidney disease: Secondary | ICD-10-CM | POA: Diagnosis not present

## 2019-04-08 DIAGNOSIS — N2581 Secondary hyperparathyroidism of renal origin: Secondary | ICD-10-CM | POA: Diagnosis not present

## 2019-04-08 DIAGNOSIS — Z992 Dependence on renal dialysis: Secondary | ICD-10-CM | POA: Diagnosis not present

## 2019-04-10 DIAGNOSIS — D631 Anemia in chronic kidney disease: Secondary | ICD-10-CM | POA: Diagnosis not present

## 2019-04-10 DIAGNOSIS — N2581 Secondary hyperparathyroidism of renal origin: Secondary | ICD-10-CM | POA: Diagnosis not present

## 2019-04-10 DIAGNOSIS — N186 End stage renal disease: Secondary | ICD-10-CM | POA: Diagnosis not present

## 2019-04-10 DIAGNOSIS — Z992 Dependence on renal dialysis: Secondary | ICD-10-CM | POA: Diagnosis not present

## 2019-04-12 DIAGNOSIS — N2581 Secondary hyperparathyroidism of renal origin: Secondary | ICD-10-CM | POA: Diagnosis not present

## 2019-04-12 DIAGNOSIS — Z992 Dependence on renal dialysis: Secondary | ICD-10-CM | POA: Diagnosis not present

## 2019-04-12 DIAGNOSIS — D631 Anemia in chronic kidney disease: Secondary | ICD-10-CM | POA: Diagnosis not present

## 2019-04-12 DIAGNOSIS — N186 End stage renal disease: Secondary | ICD-10-CM | POA: Diagnosis not present

## 2019-04-15 DIAGNOSIS — Z992 Dependence on renal dialysis: Secondary | ICD-10-CM | POA: Diagnosis not present

## 2019-04-15 DIAGNOSIS — B351 Tinea unguium: Secondary | ICD-10-CM | POA: Diagnosis not present

## 2019-04-15 DIAGNOSIS — M79675 Pain in left toe(s): Secondary | ICD-10-CM | POA: Diagnosis not present

## 2019-04-15 DIAGNOSIS — Z23 Encounter for immunization: Secondary | ICD-10-CM | POA: Diagnosis not present

## 2019-04-15 DIAGNOSIS — I129 Hypertensive chronic kidney disease with stage 1 through stage 4 chronic kidney disease, or unspecified chronic kidney disease: Secondary | ICD-10-CM | POA: Diagnosis not present

## 2019-04-15 DIAGNOSIS — N186 End stage renal disease: Secondary | ICD-10-CM | POA: Diagnosis not present

## 2019-04-15 DIAGNOSIS — N2581 Secondary hyperparathyroidism of renal origin: Secondary | ICD-10-CM | POA: Diagnosis not present

## 2019-04-15 DIAGNOSIS — M79674 Pain in right toe(s): Secondary | ICD-10-CM | POA: Diagnosis not present

## 2019-04-17 ENCOUNTER — Encounter: Payer: Self-pay | Admitting: Podiatry

## 2019-04-17 ENCOUNTER — Ambulatory Visit (INDEPENDENT_AMBULATORY_CARE_PROVIDER_SITE_OTHER): Payer: Medicare Other | Admitting: Podiatry

## 2019-04-17 ENCOUNTER — Other Ambulatory Visit: Payer: Self-pay

## 2019-04-17 DIAGNOSIS — N2581 Secondary hyperparathyroidism of renal origin: Secondary | ICD-10-CM | POA: Diagnosis not present

## 2019-04-17 DIAGNOSIS — M79675 Pain in left toe(s): Secondary | ICD-10-CM | POA: Diagnosis not present

## 2019-04-17 DIAGNOSIS — B351 Tinea unguium: Secondary | ICD-10-CM

## 2019-04-17 DIAGNOSIS — D631 Anemia in chronic kidney disease: Secondary | ICD-10-CM

## 2019-04-17 DIAGNOSIS — N186 End stage renal disease: Secondary | ICD-10-CM | POA: Diagnosis not present

## 2019-04-17 DIAGNOSIS — M79674 Pain in right toe(s): Secondary | ICD-10-CM | POA: Diagnosis not present

## 2019-04-17 DIAGNOSIS — Z23 Encounter for immunization: Secondary | ICD-10-CM | POA: Diagnosis not present

## 2019-04-17 DIAGNOSIS — Z992 Dependence on renal dialysis: Secondary | ICD-10-CM

## 2019-04-17 DIAGNOSIS — L84 Corns and callosities: Secondary | ICD-10-CM

## 2019-04-17 MED ORDER — NONFORMULARY OR COMPOUNDED ITEM: 3 refills | Status: DC

## 2019-04-17 NOTE — Patient Instructions (Addendum)
[place] [phone] Antifungal nail solution  Onychomycosis/Fungal Toenails  WHAT IS IT? An infection that lies within the keratin of your nail plate that is caused by a fungus.  WHY ME? Fungal infections affect all ages, sexes, races, and creeds.  There may be many factors that predispose you to a fungal infection such as age, coexisting medical conditions such as diabetes, or an autoimmune disease; stress, medications, fatigue, genetics, etc.  Bottom line: fungus thrives in a warm, moist environment and your shoes offer such a location.  IS IT CONTAGIOUS? Theoretically, yes.  You do not want to share shoes, nail clippers or files with someone who has fungal toenails.  Walking around barefoot in the same room or sleeping in the same bed is unlikely to transfer the organism.  It is important to realize, however, that fungus can spread easily from one nail to the next on the same foot.  HOW DO WE TREAT THIS?  There are several ways to treat this condition.  Treatment may depend on many factors such as age, medications, pregnancy, liver and kidney conditions, etc.  It is best to ask your doctor which options are available to you.  1. No treatment.   Unlike many other medical concerns, you can live with this condition.  However for many people this can be a painful condition and may lead to ingrown toenails or a bacterial infection.  It is recommended that you keep the nails cut short to help reduce the amount of fungal nail. 2. Topical treatment.  These range from herbal remedies to prescription strength nail lacquers.  About 40-50% effective, topicals require twice daily application for approximately 9 to 12 months or until an entirely new nail has grown out.  The most effective topicals are medical grade medications available through physicians offices. 3. Oral antifungal medications.  With an 80-90% cure rate, the most common oral medication requires 3 to 4 months of therapy and stays  in your system for a year as the new nail grows out.  Oral antifungal medications do require blood work to make sure it is a safe drug for you.  A liver function panel will be performed prior to starting the medication and after the first month of treatment.  It is important to have the blood work performed to avoid any harmful side effects.  In general, this medication safe but blood work is required. 4. Laser Therapy.  This treatment is performed by applying a specialized laser to the affected nail plate.  This therapy is noninvasive, fast, and non-painful.  It is not covered by insurance and is therefore, out of pocket.  The results have been very good with a 80-95% cure rate.  The [facility] is the only practice in the area to offer this therapy. 5. Permanent Nail Avulsion.  Removing the entire nail so that a new nail will not grow back.  Athlete's Foot  Athlete's foot (tinea pedis) is a fungal infection of the skin on your feet. It often occurs on the skin that is between or underneath the toes. It can also occur on the soles of your feet. The infection can spread from person to person (is contagious). It can also spread when a person's bare feet come in contact with the fungus on shower floors or on items such as shoes. What are the causes? This condition is caused by a fungus that grows in warm, moist places. You can get athlete's foot by sharing shoes, shower stalls, towels,  and wet floors with someone who is infected. Not washing your feet or changing your socks often enough can also lead to athlete's foot. What increases the risk? This condition is more likely to develop in:  Men.  People who have a weak body defense system (immune system).  People who have diabetes.  People who use public showers, such as at a gym.  People who wear heavy-duty shoes, such as Environmental manager.  Seasons with warm, humid weather. What are the signs or symptoms? Symptoms of this  condition include:  Itchy areas between your toes or on the soles of your feet.  White, flaky, or scaly areas between your toes or on the soles of your feet.  Very itchy small blisters between your toes or on the soles of your feet.  Small cuts in your skin. These cuts can become infected.  Thick or discolored toenails. How is this diagnosed? This condition may be diagnosed with a physical exam and a review of your medical history. Your health care provider may also take a skin or toenail sample to examine under a microscope. How is this treated? This condition is treated with antifungal medicines. These may be applied as powders, ointments, or creams. In severe cases, an oral antifungal medicine may be given. Follow these instructions at home: Medicines  Apply or take over-the-counter and prescription medicines only as told by your health care provider.  Apply your antifungal medicine as told by your health care provider. Do not stop using the antifungal even if your condition improves. Foot care  Do not scratch your feet.  Keep your feet dry: ? Wear cotton or wool socks. Change your socks every day or if they become wet. ? Wear shoes that allow air to flow, such as sandals or canvas tennis shoes.  Wash and dry your feet, including the area between your toes. Also, wash and dry your feet: ? Every day or as told by your health care provider. ? After exercising. General instructions  Do not let others use towels, shoes, nail clippers, or other personal items that touch your feet.  Protect your feet by wearing sandals in wet areas, such as locker rooms and shared showers.  Keep all follow-up visits as told by your health care provider. This is important.  If you have diabetes, keep your blood sugar under control. Contact a health care provider if:  You have a fever.  You have swelling, soreness, warmth, or redness in your foot.  Your feet are not getting better with  treatment.  Your symptoms get worse.  You have new symptoms. Summary  Athlete's foot (tinea pedis) is a fungal infection of the skin on your feet. It often occurs on skin that is between or underneath the toes.  This condition is caused by a fungus that grows in warm, moist places.  Symptoms include white, flaky, or scaly areas between your toes or on the soles of your feet.  This condition is treated with antifungal medicines.  Keep your feet clean. Always dry them thoroughly. This information is not intended to replace advice given to you by your health care provider. Make sure you discuss any questions you have with your health care provider. Document Revised: 02/23/2017 Document Reviewed: 12/19/2016 Elsevier Patient Education  [place].

## 2019-04-19 DIAGNOSIS — Z23 Encounter for immunization: Secondary | ICD-10-CM | POA: Diagnosis not present

## 2019-04-19 DIAGNOSIS — N2581 Secondary hyperparathyroidism of renal origin: Secondary | ICD-10-CM | POA: Diagnosis not present

## 2019-04-19 DIAGNOSIS — N186 End stage renal disease: Secondary | ICD-10-CM | POA: Diagnosis not present

## 2019-04-19 DIAGNOSIS — Z992 Dependence on renal dialysis: Secondary | ICD-10-CM | POA: Diagnosis not present

## 2019-04-22 DIAGNOSIS — N2581 Secondary hyperparathyroidism of renal origin: Secondary | ICD-10-CM | POA: Diagnosis not present

## 2019-04-22 DIAGNOSIS — Z992 Dependence on renal dialysis: Secondary | ICD-10-CM | POA: Diagnosis not present

## 2019-04-22 DIAGNOSIS — N186 End stage renal disease: Secondary | ICD-10-CM | POA: Diagnosis not present

## 2019-04-22 DIAGNOSIS — Z23 Encounter for immunization: Secondary | ICD-10-CM | POA: Diagnosis not present

## 2019-04-22 NOTE — Progress Notes (Signed)
Subjective: Dwayne Reed presents today for follow up of callus(es) b/l and painful mycotic toenails b/l that are difficult to trim. Pain interferes with ambulation. Aggravating factors include wearing enclosed shoe gear. Pain is relieved with periodic professional debridement..   No Known Allergies   Objective: There were no vitals filed for this visit.  Vascular Examination:  Capillary fill time to digits <3s b/l, palpable DP pulses b/l, palpable PT pulses b/l, pedal hair absent b/l and skin temperature gradient within normal limits b/l  Dermatological Examination: Pedal skin with normal turgor, texture and tone bilaterally, no open wounds bilaterally, no interdigital macerations bilaterally, toenails 1-5 b/l elongated, dystrophic, thickened, crumbly with subungual debris and hyperkeratotic lesion(s) submet head 1 left, submet head 2 b/l and submet head 5 left.  No erythema, no edema, no drainage, no flocculence  Musculoskeletal: Normal muscle strength 5/5 to all lower extremity muscle groups bilaterally, no pain crepitus or joint limitation noted with ROM b/l, bunion deformity noted b/l and hammertoes noted to the  2-5 bilaterally  Neurological: Protective sensation intact 5/5 intact bilaterally with 10g monofilament b/l and vibratory sensation intact b/l  Assessment: No diagnosis found.  Plan: -Toenails 1-5 b/l were debrided in length and girth without iatrogenic bleeding. -calluses were debrided without complication or incident. Total number debrided =4 submet head 1 left, submet head 2 b/l and submet head 5 left  -Patient to continue soft, supportive shoe gear daily. -Patient to report any pedal injuries to medical professional immediately. -Discussed topical, laser and oral medication. Patient opted for topical treatment with compounded medication. Rx written for nonformulary compounding topical antifungal: [facility]: Antifungal cream - Terbinafine 3%, Fluconazole 2%, Tea  Tree Oil 5%, Urea 10%, Ibuprofen 2% in DMSO Suspension #24ml. Apply to the affected nail(s) at bedtime. -Patient/POA to call should there be question/concern in the interim.  Return in about 3 months (around 07/15/2019) for nail trim.

## 2019-04-24 DIAGNOSIS — N186 End stage renal disease: Secondary | ICD-10-CM | POA: Diagnosis not present

## 2019-04-24 DIAGNOSIS — Z992 Dependence on renal dialysis: Secondary | ICD-10-CM | POA: Diagnosis not present

## 2019-04-24 DIAGNOSIS — N2581 Secondary hyperparathyroidism of renal origin: Secondary | ICD-10-CM | POA: Diagnosis not present

## 2019-04-24 DIAGNOSIS — Z23 Encounter for immunization: Secondary | ICD-10-CM | POA: Diagnosis not present

## 2019-04-26 DIAGNOSIS — N2581 Secondary hyperparathyroidism of renal origin: Secondary | ICD-10-CM | POA: Diagnosis not present

## 2019-04-26 DIAGNOSIS — N186 End stage renal disease: Secondary | ICD-10-CM | POA: Diagnosis not present

## 2019-04-26 DIAGNOSIS — Z23 Encounter for immunization: Secondary | ICD-10-CM | POA: Diagnosis not present

## 2019-04-26 DIAGNOSIS — Z992 Dependence on renal dialysis: Secondary | ICD-10-CM | POA: Diagnosis not present

## 2019-04-29 DIAGNOSIS — N186 End stage renal disease: Secondary | ICD-10-CM | POA: Diagnosis not present

## 2019-04-29 DIAGNOSIS — N2581 Secondary hyperparathyroidism of renal origin: Secondary | ICD-10-CM | POA: Diagnosis not present

## 2019-04-29 DIAGNOSIS — Z23 Encounter for immunization: Secondary | ICD-10-CM | POA: Diagnosis not present

## 2019-04-29 DIAGNOSIS — Z992 Dependence on renal dialysis: Secondary | ICD-10-CM | POA: Diagnosis not present

## 2019-04-30 ENCOUNTER — Other Ambulatory Visit: Payer: Self-pay

## 2019-04-30 ENCOUNTER — Encounter: Payer: Self-pay | Admitting: Urology

## 2019-04-30 ENCOUNTER — Ambulatory Visit (INDEPENDENT_AMBULATORY_CARE_PROVIDER_SITE_OTHER): Payer: Medicare Other | Admitting: Urology

## 2019-04-30 VITALS — BP 116/76 | HR 91 | Wt 187.0 lb

## 2019-04-30 DIAGNOSIS — N401 Enlarged prostate with lower urinary tract symptoms: Secondary | ICD-10-CM

## 2019-04-30 DIAGNOSIS — Z992 Dependence on renal dialysis: Secondary | ICD-10-CM | POA: Diagnosis not present

## 2019-04-30 DIAGNOSIS — R972 Elevated prostate specific antigen [PSA]: Secondary | ICD-10-CM | POA: Diagnosis not present

## 2019-04-30 DIAGNOSIS — N3943 Post-void dribbling: Secondary | ICD-10-CM | POA: Diagnosis not present

## 2019-04-30 DIAGNOSIS — Z85528 Personal history of other malignant neoplasm of kidney: Secondary | ICD-10-CM

## 2019-04-30 DIAGNOSIS — N200 Calculus of kidney: Secondary | ICD-10-CM | POA: Diagnosis not present

## 2019-04-30 DIAGNOSIS — N186 End stage renal disease: Secondary | ICD-10-CM | POA: Diagnosis not present

## 2019-04-30 DIAGNOSIS — C61 Malignant neoplasm of prostate: Secondary | ICD-10-CM | POA: Diagnosis not present

## 2019-04-30 NOTE — Progress Notes (Signed)
04/30/2019 12:32 PM   Cecile Hearing 1955-12-02 [id number]  Referring provider: Elby Beck, [place],  [place] [address]  Chief Complaint  Patient presents with  . Elevated PSA    New patient    HPI: 64 year old male with multiple GU issues who presents today to establish care.  He is.  Is followed at [facility].  He has a personal history of end-stage renal disease on hemodialysis 2016.  He has a personal history of low risk prostate cancer, Gleason 3+3 (single foci at the left mid base) his PSA was in the 9 range a back to 2014.  Ultimately, he underwent prostate biopsy in 2017 at which time his PSA was 9.4.  Prostate volume 66 g at that time.  He also has a personal history of renal cell carcinoma for which he underwent cryoablation of the left-sided tumor and 2016.  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  He is primarily referred for elevated PSA.  His PSA is 10.56 Routine annual screening PSA was primary care on 03/27/2019.  He also has a personal history of kidney stones.  Most recent CT scan 05/2017 indicating a chronic 6 mm right distal ureteral calculus.  Prostamegaly was appreciated.  This was a noncontrast study.  [place]ela of end-stage renal disease noted including bilateral renal atrophy.  No flank pain.    He voids very small amount, 1 per day with small dribbles depending on fluid intake.  Minimal bother.    PMH: Past Medical History:  Diagnosis Date  . Anemia    low iron  . CHF (congestive heart failure) ([place])    pt denies  . COPD (chronic obstructive pulmonary disease) ([place])   . History of kidney stones   . Hypertension   . Kidney stones   . Pneumonia   . Renal disorder    ESRD on HD (M,W,F)    Surgical History: Past Surgical History:  Procedure Laterality Date  . EXTRACORPOREAL SHOCK WAVE LITHOTRIPSY    . LITHOTRIPSY     x 5  . PROSTATE BIOPSY      Home Medications:    Allergies as of 04/30/2019   No Known Allergies     Medication List       Accurate as of April 30, 2019 12:32 PM. If you have any questions, ask your nurse or doctor.        STOP taking these medications   atorvastatin 20 MG tablet Commonly known as: LIPITOR Stopped by: Hollice Espy, MD     TAKE these medications   Auryxia 1 GM 210 MG(Fe) tablet Generic drug: ferric citrate Take 3 tablets (630 mg total) by mouth 3 (three) times daily.   carvedilol 25 MG tablet Commonly known as: COREG Take 1 tablet by mouth 2 (two) times daily.   Dialyvite 800-Zinc 15 0.8 MG Tabs TAKE 1 TABLET BY MOUTH EVERY DAY WITH THE EVENING MEAL   NONFORMULARY OR COMPOUNDED ITEM Antifungal solution: Terbinafine 3%, Fluconazole 2%, Tea Tree Oil 5%, Urea 10%, Ibuprofen 2% in DMSO suspension #56mL       Allergies: No Known Allergies  Family History: Family History  Problem Relation Age of Onset  . Heart disease Mother   . Sarcoidosis Sister   . Cancer Maternal Grandmother        type unknown    Social History:  reports that he has been smoking. He has been smoking about 0.15 packs per day. He  has never used smokeless tobacco. He reports current alcohol use. He reports that he does not use drugs.   Physical Exam: BP 116/76   Pulse 91   Wt 187 lb (84.8 kg)   BMI 26.08 kg/m   Constitutional:  Alert and oriented, No acute distress. HEENT: [place] AT, moist mucus membranes.  Trachea midline, no masses. Cardiovascular: No clubbing, cyanosis, or edema. Respiratory: Normal respiratory effort, no increased work of breathing. GI: Abdomen is soft, nontender, nondistended, no abdominal masses Rectal: Sphincter tone.  Significant prostamegaly, 55 cc gland, nontender, no nodules.  External hemorrhoid appreciated. Skin: No rashes, bruises or suspicious lesions. Neurologic: Grossly intact, no focal deficits, moving all 4 extremities. Psychiatric: Normal mood and affect.  Laboratory Data: Lab  Results  Component Value Date   WBC 5.2 03/27/2019   HGB 11.8 (L) 03/27/2019   HCT 35.0 (L) 03/27/2019   MCV 98.1 03/27/2019   PLT 181.0 03/27/2019    Lab Results  Component Value Date   CREATININE 7.09 ([place]) 03/27/2019    Lab Results  Component Value Date   PSA 10.56 (H) 03/27/2019    Pertinent Imaging: No recent cross-sectional imaging  Assessment & Plan:     1. Malignant tumor of prostate ([place]) Personal history of low volume, low risk prostate cancer as above  No recent imaging or confirmatory biopsies  Looking at overall PSA trend over time, his PSA has been markedly elevated and stably so dating back to at least 2014 which is reassuring  Most recent PSA is consistent with previous, recheck today  Rectal exam also reassuring  In the setting of medical comorbidities and stable PSA, I recommended continued annual follow-up for his low volume low risk prostate cancer with PSA rectal exam.  If he becomes more symptomatic or if his PSA begins to rise, would recommend either repeat biopsy versus MRI.  He is agreeable this plan. - US RENAL; Future  2. History of renal cell cancer Patient history of papillary renal cell carcinoma status post cryoablation without recent follow-up imaging  I recommend renal ultrasound for surveillance and if this is unremarkable, would not likely need any further follow-up given that its been 5 years since intervention  3. ESRD on dialysis [facility]) Currently on hemodialysis  Briefly consider transplantation but unwilling to receive transplant for complete outpatient thus abandoned this  4. Benign prostatic hyperplasia with post-void dribbling Marked prostamegaly on exam and previous transrectal ultrasound  Relatively asymptomatic this point time given resting polyuria  5. Nephrolithiasis Personal history of recurrent nephrolithiasis with chronic obstructing stone on most recent cross-sectional imaging as above  Asymptomatic  Given  that he is on hemodialysis 10 otherwise asymptomatic, would only treat as needed for an infection which does not seem to be an issue or pain from obstruction.   He is agreeable this plan.   Return in about 1 year (around 04/29/2020) for PSA/ DRE (will call with RUS results).  Hollice Espy, MD  [facility] [address], [place], [place] [address] [phone]

## 2019-05-01 ENCOUNTER — Ambulatory Visit
Admission: RE | Admit: 2019-05-01 | Discharge: 2019-05-01 | Disposition: A | Discharge status: Home / Self Care | Payer: Medicare Other | Source: Ambulatory Visit | Attending: Urology | Admitting: Urology

## 2019-05-01 DIAGNOSIS — C642 Malignant neoplasm of left kidney, except renal pelvis: Secondary | ICD-10-CM | POA: Diagnosis not present

## 2019-05-01 DIAGNOSIS — C61 Malignant neoplasm of prostate: Secondary | ICD-10-CM | POA: Diagnosis not present

## 2019-05-01 DIAGNOSIS — Z23 Encounter for immunization: Secondary | ICD-10-CM | POA: Diagnosis not present

## 2019-05-01 DIAGNOSIS — Z992 Dependence on renal dialysis: Secondary | ICD-10-CM | POA: Diagnosis not present

## 2019-05-01 DIAGNOSIS — N2581 Secondary hyperparathyroidism of renal origin: Secondary | ICD-10-CM | POA: Diagnosis not present

## 2019-05-01 DIAGNOSIS — N186 End stage renal disease: Secondary | ICD-10-CM | POA: Diagnosis not present

## 2019-05-01 LAB — PSA: Prostate Specific Ag, Serum: 8.7 ng/mL — ABNORMAL HIGH (ref 0.0–4.0)

## 2019-05-03 ENCOUNTER — Telehealth: Payer: Self-pay | Admitting: *Deleted

## 2019-05-03 DIAGNOSIS — Z23 Encounter for immunization: Secondary | ICD-10-CM | POA: Diagnosis not present

## 2019-05-03 DIAGNOSIS — Z992 Dependence on renal dialysis: Secondary | ICD-10-CM | POA: Diagnosis not present

## 2019-05-03 DIAGNOSIS — N186 End stage renal disease: Secondary | ICD-10-CM | POA: Diagnosis not present

## 2019-05-03 DIAGNOSIS — N2581 Secondary hyperparathyroidism of renal origin: Secondary | ICD-10-CM | POA: Diagnosis not present

## 2019-05-03 NOTE — Telephone Encounter (Addendum)
Patient informed, verbalized understanding.    ----- Message from Hollice Espy, MD sent at 05/02/2019  1:26 PM EST ----- Renal ultrasound looks fine, kidneys are atrophic which is expected given your kidney disease but there is no recurrent masses or any other concerning findings.  No obvious stones are seen.  Hollice Espy, MD

## 2019-05-06 DIAGNOSIS — N2581 Secondary hyperparathyroidism of renal origin: Secondary | ICD-10-CM | POA: Diagnosis not present

## 2019-05-06 DIAGNOSIS — N186 End stage renal disease: Secondary | ICD-10-CM | POA: Diagnosis not present

## 2019-05-06 DIAGNOSIS — Z992 Dependence on renal dialysis: Secondary | ICD-10-CM | POA: Diagnosis not present

## 2019-05-06 DIAGNOSIS — Z23 Encounter for immunization: Secondary | ICD-10-CM | POA: Diagnosis not present

## 2019-05-07 DIAGNOSIS — Z992 Dependence on renal dialysis: Secondary | ICD-10-CM | POA: Diagnosis not present

## 2019-05-07 DIAGNOSIS — N186 End stage renal disease: Secondary | ICD-10-CM | POA: Diagnosis not present

## 2019-05-07 DIAGNOSIS — I871 Compression of vein: Secondary | ICD-10-CM | POA: Diagnosis not present

## 2019-05-08 DIAGNOSIS — N2581 Secondary hyperparathyroidism of renal origin: Secondary | ICD-10-CM | POA: Diagnosis not present

## 2019-05-08 DIAGNOSIS — Z992 Dependence on renal dialysis: Secondary | ICD-10-CM | POA: Diagnosis not present

## 2019-05-08 DIAGNOSIS — Z23 Encounter for immunization: Secondary | ICD-10-CM | POA: Diagnosis not present

## 2019-05-08 DIAGNOSIS — N186 End stage renal disease: Secondary | ICD-10-CM | POA: Diagnosis not present

## 2019-05-10 DIAGNOSIS — N2581 Secondary hyperparathyroidism of renal origin: Secondary | ICD-10-CM | POA: Diagnosis not present

## 2019-05-10 DIAGNOSIS — Z23 Encounter for immunization: Secondary | ICD-10-CM | POA: Diagnosis not present

## 2019-05-10 DIAGNOSIS — N186 End stage renal disease: Secondary | ICD-10-CM | POA: Diagnosis not present

## 2019-05-10 DIAGNOSIS — Z992 Dependence on renal dialysis: Secondary | ICD-10-CM | POA: Diagnosis not present

## 2019-05-13 DIAGNOSIS — N2581 Secondary hyperparathyroidism of renal origin: Secondary | ICD-10-CM | POA: Diagnosis not present

## 2019-05-13 DIAGNOSIS — I129 Hypertensive chronic kidney disease with stage 1 through stage 4 chronic kidney disease, or unspecified chronic kidney disease: Secondary | ICD-10-CM | POA: Diagnosis not present

## 2019-05-13 DIAGNOSIS — N186 End stage renal disease: Secondary | ICD-10-CM | POA: Diagnosis not present

## 2019-05-13 DIAGNOSIS — Z23 Encounter for immunization: Secondary | ICD-10-CM | POA: Diagnosis not present

## 2019-05-13 DIAGNOSIS — Z992 Dependence on renal dialysis: Secondary | ICD-10-CM | POA: Diagnosis not present

## 2019-05-15 DIAGNOSIS — N2581 Secondary hyperparathyroidism of renal origin: Secondary | ICD-10-CM | POA: Diagnosis not present

## 2019-05-15 DIAGNOSIS — Z23 Encounter for immunization: Secondary | ICD-10-CM | POA: Diagnosis not present

## 2019-05-15 DIAGNOSIS — N186 End stage renal disease: Secondary | ICD-10-CM | POA: Diagnosis not present

## 2019-05-15 DIAGNOSIS — Z992 Dependence on renal dialysis: Secondary | ICD-10-CM | POA: Diagnosis not present

## 2019-05-17 DIAGNOSIS — N2581 Secondary hyperparathyroidism of renal origin: Secondary | ICD-10-CM | POA: Diagnosis not present

## 2019-05-17 DIAGNOSIS — Z23 Encounter for immunization: Secondary | ICD-10-CM | POA: Diagnosis not present

## 2019-05-17 DIAGNOSIS — Z992 Dependence on renal dialysis: Secondary | ICD-10-CM | POA: Diagnosis not present

## 2019-05-17 DIAGNOSIS — N186 End stage renal disease: Secondary | ICD-10-CM | POA: Diagnosis not present

## 2019-05-20 DIAGNOSIS — N186 End stage renal disease: Secondary | ICD-10-CM | POA: Diagnosis not present

## 2019-05-20 DIAGNOSIS — Z23 Encounter for immunization: Secondary | ICD-10-CM | POA: Diagnosis not present

## 2019-05-20 DIAGNOSIS — Z992 Dependence on renal dialysis: Secondary | ICD-10-CM | POA: Diagnosis not present

## 2019-05-20 DIAGNOSIS — N2581 Secondary hyperparathyroidism of renal origin: Secondary | ICD-10-CM | POA: Diagnosis not present

## 2019-05-22 DIAGNOSIS — Z23 Encounter for immunization: Secondary | ICD-10-CM | POA: Diagnosis not present

## 2019-05-22 DIAGNOSIS — N2581 Secondary hyperparathyroidism of renal origin: Secondary | ICD-10-CM | POA: Diagnosis not present

## 2019-05-22 DIAGNOSIS — N186 End stage renal disease: Secondary | ICD-10-CM | POA: Diagnosis not present

## 2019-05-22 DIAGNOSIS — Z992 Dependence on renal dialysis: Secondary | ICD-10-CM | POA: Diagnosis not present

## 2019-05-24 DIAGNOSIS — N186 End stage renal disease: Secondary | ICD-10-CM | POA: Diagnosis not present

## 2019-05-24 DIAGNOSIS — Z992 Dependence on renal dialysis: Secondary | ICD-10-CM | POA: Diagnosis not present

## 2019-05-24 DIAGNOSIS — Z23 Encounter for immunization: Secondary | ICD-10-CM | POA: Diagnosis not present

## 2019-05-24 DIAGNOSIS — N2581 Secondary hyperparathyroidism of renal origin: Secondary | ICD-10-CM | POA: Diagnosis not present

## 2019-05-27 DIAGNOSIS — N186 End stage renal disease: Secondary | ICD-10-CM | POA: Diagnosis not present

## 2019-05-27 DIAGNOSIS — Z23 Encounter for immunization: Secondary | ICD-10-CM | POA: Diagnosis not present

## 2019-05-27 DIAGNOSIS — N2581 Secondary hyperparathyroidism of renal origin: Secondary | ICD-10-CM | POA: Diagnosis not present

## 2019-05-27 DIAGNOSIS — Z992 Dependence on renal dialysis: Secondary | ICD-10-CM | POA: Diagnosis not present

## 2019-05-28 DIAGNOSIS — N186 End stage renal disease: Secondary | ICD-10-CM | POA: Diagnosis not present

## 2019-05-29 DIAGNOSIS — Z992 Dependence on renal dialysis: Secondary | ICD-10-CM | POA: Diagnosis not present

## 2019-05-29 DIAGNOSIS — Z23 Encounter for immunization: Secondary | ICD-10-CM | POA: Diagnosis not present

## 2019-05-29 DIAGNOSIS — N186 End stage renal disease: Secondary | ICD-10-CM | POA: Diagnosis not present

## 2019-05-29 DIAGNOSIS — N2581 Secondary hyperparathyroidism of renal origin: Secondary | ICD-10-CM | POA: Diagnosis not present

## 2019-05-31 DIAGNOSIS — Z23 Encounter for immunization: Secondary | ICD-10-CM | POA: Diagnosis not present

## 2019-05-31 DIAGNOSIS — N186 End stage renal disease: Secondary | ICD-10-CM | POA: Diagnosis not present

## 2019-05-31 DIAGNOSIS — N2581 Secondary hyperparathyroidism of renal origin: Secondary | ICD-10-CM | POA: Diagnosis not present

## 2019-05-31 DIAGNOSIS — Z992 Dependence on renal dialysis: Secondary | ICD-10-CM | POA: Diagnosis not present

## 2019-06-03 DIAGNOSIS — N2581 Secondary hyperparathyroidism of renal origin: Secondary | ICD-10-CM | POA: Diagnosis not present

## 2019-06-03 DIAGNOSIS — Z992 Dependence on renal dialysis: Secondary | ICD-10-CM | POA: Diagnosis not present

## 2019-06-03 DIAGNOSIS — Z23 Encounter for immunization: Secondary | ICD-10-CM | POA: Diagnosis not present

## 2019-06-03 DIAGNOSIS — N186 End stage renal disease: Secondary | ICD-10-CM | POA: Diagnosis not present

## 2019-06-05 DIAGNOSIS — N186 End stage renal disease: Secondary | ICD-10-CM | POA: Diagnosis not present

## 2019-06-05 DIAGNOSIS — Z992 Dependence on renal dialysis: Secondary | ICD-10-CM | POA: Diagnosis not present

## 2019-06-05 DIAGNOSIS — N2581 Secondary hyperparathyroidism of renal origin: Secondary | ICD-10-CM | POA: Diagnosis not present

## 2019-06-05 DIAGNOSIS — Z23 Encounter for immunization: Secondary | ICD-10-CM | POA: Diagnosis not present

## 2019-06-07 DIAGNOSIS — Z992 Dependence on renal dialysis: Secondary | ICD-10-CM | POA: Diagnosis not present

## 2019-06-07 DIAGNOSIS — N2581 Secondary hyperparathyroidism of renal origin: Secondary | ICD-10-CM | POA: Diagnosis not present

## 2019-06-07 DIAGNOSIS — Z23 Encounter for immunization: Secondary | ICD-10-CM | POA: Diagnosis not present

## 2019-06-07 DIAGNOSIS — N186 End stage renal disease: Secondary | ICD-10-CM | POA: Diagnosis not present

## 2019-06-10 DIAGNOSIS — Z23 Encounter for immunization: Secondary | ICD-10-CM | POA: Diagnosis not present

## 2019-06-10 DIAGNOSIS — N2581 Secondary hyperparathyroidism of renal origin: Secondary | ICD-10-CM | POA: Diagnosis not present

## 2019-06-10 DIAGNOSIS — N186 End stage renal disease: Secondary | ICD-10-CM | POA: Diagnosis not present

## 2019-06-10 DIAGNOSIS — Z992 Dependence on renal dialysis: Secondary | ICD-10-CM | POA: Diagnosis not present

## 2019-06-12 DIAGNOSIS — Z992 Dependence on renal dialysis: Secondary | ICD-10-CM | POA: Diagnosis not present

## 2019-06-12 DIAGNOSIS — N186 End stage renal disease: Secondary | ICD-10-CM | POA: Diagnosis not present

## 2019-06-12 DIAGNOSIS — N2581 Secondary hyperparathyroidism of renal origin: Secondary | ICD-10-CM | POA: Diagnosis not present

## 2019-06-12 DIAGNOSIS — Z23 Encounter for immunization: Secondary | ICD-10-CM | POA: Diagnosis not present

## 2019-06-13 DIAGNOSIS — Z992 Dependence on renal dialysis: Secondary | ICD-10-CM | POA: Diagnosis not present

## 2019-06-13 DIAGNOSIS — N186 End stage renal disease: Secondary | ICD-10-CM | POA: Diagnosis not present

## 2019-06-13 DIAGNOSIS — I129 Hypertensive chronic kidney disease with stage 1 through stage 4 chronic kidney disease, or unspecified chronic kidney disease: Secondary | ICD-10-CM | POA: Diagnosis not present

## 2019-06-14 DIAGNOSIS — N186 End stage renal disease: Secondary | ICD-10-CM | POA: Diagnosis not present

## 2019-06-14 DIAGNOSIS — N2581 Secondary hyperparathyroidism of renal origin: Secondary | ICD-10-CM | POA: Diagnosis not present

## 2019-06-14 DIAGNOSIS — Z992 Dependence on renal dialysis: Secondary | ICD-10-CM | POA: Diagnosis not present

## 2019-06-14 DIAGNOSIS — D631 Anemia in chronic kidney disease: Secondary | ICD-10-CM | POA: Diagnosis not present

## 2019-06-17 DIAGNOSIS — Z992 Dependence on renal dialysis: Secondary | ICD-10-CM | POA: Diagnosis not present

## 2019-06-17 DIAGNOSIS — D631 Anemia in chronic kidney disease: Secondary | ICD-10-CM | POA: Diagnosis not present

## 2019-06-17 DIAGNOSIS — N186 End stage renal disease: Secondary | ICD-10-CM | POA: Diagnosis not present

## 2019-06-17 DIAGNOSIS — N2581 Secondary hyperparathyroidism of renal origin: Secondary | ICD-10-CM | POA: Diagnosis not present

## 2019-06-19 DIAGNOSIS — Z992 Dependence on renal dialysis: Secondary | ICD-10-CM | POA: Diagnosis not present

## 2019-06-19 DIAGNOSIS — N186 End stage renal disease: Secondary | ICD-10-CM | POA: Diagnosis not present

## 2019-06-19 DIAGNOSIS — N2581 Secondary hyperparathyroidism of renal origin: Secondary | ICD-10-CM | POA: Diagnosis not present

## 2019-06-19 DIAGNOSIS — D631 Anemia in chronic kidney disease: Secondary | ICD-10-CM | POA: Diagnosis not present

## 2019-06-21 DIAGNOSIS — Z992 Dependence on renal dialysis: Secondary | ICD-10-CM | POA: Diagnosis not present

## 2019-06-21 DIAGNOSIS — D631 Anemia in chronic kidney disease: Secondary | ICD-10-CM | POA: Diagnosis not present

## 2019-06-21 DIAGNOSIS — N186 End stage renal disease: Secondary | ICD-10-CM | POA: Diagnosis not present

## 2019-06-21 DIAGNOSIS — N2581 Secondary hyperparathyroidism of renal origin: Secondary | ICD-10-CM | POA: Diagnosis not present

## 2019-06-24 DIAGNOSIS — N186 End stage renal disease: Secondary | ICD-10-CM | POA: Diagnosis not present

## 2019-06-24 DIAGNOSIS — N2581 Secondary hyperparathyroidism of renal origin: Secondary | ICD-10-CM | POA: Diagnosis not present

## 2019-06-24 DIAGNOSIS — Z992 Dependence on renal dialysis: Secondary | ICD-10-CM | POA: Diagnosis not present

## 2019-06-24 DIAGNOSIS — D631 Anemia in chronic kidney disease: Secondary | ICD-10-CM | POA: Diagnosis not present

## 2019-06-26 DIAGNOSIS — N186 End stage renal disease: Secondary | ICD-10-CM | POA: Diagnosis not present

## 2019-06-26 DIAGNOSIS — N2581 Secondary hyperparathyroidism of renal origin: Secondary | ICD-10-CM | POA: Diagnosis not present

## 2019-06-26 DIAGNOSIS — Z992 Dependence on renal dialysis: Secondary | ICD-10-CM | POA: Diagnosis not present

## 2019-06-26 DIAGNOSIS — D631 Anemia in chronic kidney disease: Secondary | ICD-10-CM | POA: Diagnosis not present

## 2019-06-28 DIAGNOSIS — N186 End stage renal disease: Secondary | ICD-10-CM | POA: Diagnosis not present

## 2019-06-28 DIAGNOSIS — Z992 Dependence on renal dialysis: Secondary | ICD-10-CM | POA: Diagnosis not present

## 2019-06-28 DIAGNOSIS — D631 Anemia in chronic kidney disease: Secondary | ICD-10-CM | POA: Diagnosis not present

## 2019-06-28 DIAGNOSIS — N2581 Secondary hyperparathyroidism of renal origin: Secondary | ICD-10-CM | POA: Diagnosis not present

## 2019-07-01 DIAGNOSIS — N2581 Secondary hyperparathyroidism of renal origin: Secondary | ICD-10-CM | POA: Diagnosis not present

## 2019-07-01 DIAGNOSIS — D631 Anemia in chronic kidney disease: Secondary | ICD-10-CM | POA: Diagnosis not present

## 2019-07-01 DIAGNOSIS — N186 End stage renal disease: Secondary | ICD-10-CM | POA: Diagnosis not present

## 2019-07-01 DIAGNOSIS — Z992 Dependence on renal dialysis: Secondary | ICD-10-CM | POA: Diagnosis not present

## 2019-07-03 DIAGNOSIS — D631 Anemia in chronic kidney disease: Secondary | ICD-10-CM | POA: Diagnosis not present

## 2019-07-03 DIAGNOSIS — N2581 Secondary hyperparathyroidism of renal origin: Secondary | ICD-10-CM | POA: Diagnosis not present

## 2019-07-03 DIAGNOSIS — Z992 Dependence on renal dialysis: Secondary | ICD-10-CM | POA: Diagnosis not present

## 2019-07-03 DIAGNOSIS — N186 End stage renal disease: Secondary | ICD-10-CM | POA: Diagnosis not present

## 2019-07-05 DIAGNOSIS — D631 Anemia in chronic kidney disease: Secondary | ICD-10-CM | POA: Diagnosis not present

## 2019-07-05 DIAGNOSIS — N186 End stage renal disease: Secondary | ICD-10-CM | POA: Diagnosis not present

## 2019-07-05 DIAGNOSIS — N2581 Secondary hyperparathyroidism of renal origin: Secondary | ICD-10-CM | POA: Diagnosis not present

## 2019-07-05 DIAGNOSIS — Z992 Dependence on renal dialysis: Secondary | ICD-10-CM | POA: Diagnosis not present

## 2019-07-08 DIAGNOSIS — N2581 Secondary hyperparathyroidism of renal origin: Secondary | ICD-10-CM | POA: Diagnosis not present

## 2019-07-08 DIAGNOSIS — D631 Anemia in chronic kidney disease: Secondary | ICD-10-CM | POA: Diagnosis not present

## 2019-07-08 DIAGNOSIS — N186 End stage renal disease: Secondary | ICD-10-CM | POA: Diagnosis not present

## 2019-07-08 DIAGNOSIS — Z992 Dependence on renal dialysis: Secondary | ICD-10-CM | POA: Diagnosis not present

## 2019-07-10 DIAGNOSIS — N2581 Secondary hyperparathyroidism of renal origin: Secondary | ICD-10-CM | POA: Diagnosis not present

## 2019-07-10 DIAGNOSIS — D631 Anemia in chronic kidney disease: Secondary | ICD-10-CM | POA: Diagnosis not present

## 2019-07-10 DIAGNOSIS — N186 End stage renal disease: Secondary | ICD-10-CM | POA: Diagnosis not present

## 2019-07-10 DIAGNOSIS — Z992 Dependence on renal dialysis: Secondary | ICD-10-CM | POA: Diagnosis not present

## 2019-07-12 DIAGNOSIS — N2581 Secondary hyperparathyroidism of renal origin: Secondary | ICD-10-CM | POA: Diagnosis not present

## 2019-07-12 DIAGNOSIS — N186 End stage renal disease: Secondary | ICD-10-CM | POA: Diagnosis not present

## 2019-07-12 DIAGNOSIS — D631 Anemia in chronic kidney disease: Secondary | ICD-10-CM | POA: Diagnosis not present

## 2019-07-12 DIAGNOSIS — Z992 Dependence on renal dialysis: Secondary | ICD-10-CM | POA: Diagnosis not present

## 2019-07-13 DIAGNOSIS — Z992 Dependence on renal dialysis: Secondary | ICD-10-CM | POA: Diagnosis not present

## 2019-07-13 DIAGNOSIS — N186 End stage renal disease: Secondary | ICD-10-CM | POA: Diagnosis not present

## 2019-07-13 DIAGNOSIS — I129 Hypertensive chronic kidney disease with stage 1 through stage 4 chronic kidney disease, or unspecified chronic kidney disease: Secondary | ICD-10-CM | POA: Diagnosis not present

## 2019-07-15 DIAGNOSIS — N186 End stage renal disease: Secondary | ICD-10-CM | POA: Diagnosis not present

## 2019-07-15 DIAGNOSIS — D631 Anemia in chronic kidney disease: Secondary | ICD-10-CM | POA: Diagnosis not present

## 2019-07-15 DIAGNOSIS — Z992 Dependence on renal dialysis: Secondary | ICD-10-CM | POA: Diagnosis not present

## 2019-07-15 DIAGNOSIS — N2581 Secondary hyperparathyroidism of renal origin: Secondary | ICD-10-CM | POA: Diagnosis not present

## 2019-07-17 ENCOUNTER — Encounter: Payer: Self-pay | Admitting: Podiatry

## 2019-07-17 ENCOUNTER — Ambulatory Visit (INDEPENDENT_AMBULATORY_CARE_PROVIDER_SITE_OTHER): Payer: Medicare Other | Admitting: Podiatry

## 2019-07-17 ENCOUNTER — Other Ambulatory Visit: Payer: Self-pay

## 2019-07-17 DIAGNOSIS — M79674 Pain in right toe(s): Secondary | ICD-10-CM

## 2019-07-17 DIAGNOSIS — L84 Corns and callosities: Secondary | ICD-10-CM | POA: Diagnosis not present

## 2019-07-17 DIAGNOSIS — D631 Anemia in chronic kidney disease: Secondary | ICD-10-CM

## 2019-07-17 DIAGNOSIS — N186 End stage renal disease: Secondary | ICD-10-CM | POA: Diagnosis not present

## 2019-07-17 DIAGNOSIS — B351 Tinea unguium: Secondary | ICD-10-CM

## 2019-07-17 DIAGNOSIS — Z992 Dependence on renal dialysis: Secondary | ICD-10-CM

## 2019-07-17 DIAGNOSIS — N2581 Secondary hyperparathyroidism of renal origin: Secondary | ICD-10-CM | POA: Diagnosis not present

## 2019-07-17 DIAGNOSIS — M79675 Pain in left toe(s): Secondary | ICD-10-CM

## 2019-07-17 NOTE — Patient Instructions (Addendum)
Geologist, engineering with memory foam insoles.   Corns and Calluses Corns are small areas of thickened skin that occur on the top, sides, or tip of a toe. They contain a cone-shaped core with a point that can press on a nerve below. This causes pain.  Calluses are areas of thickened skin that can occur anywhere on the body, including the hands, fingers, palms, soles of the feet, and heels. Calluses are usually larger than corns. What are the causes? Corns and calluses are caused by rubbing (friction) or pressure, such as from shoes that are too tight or do not fit properly. What increases the risk? Corns are more likely to develop in people who have misshapen toes (toe deformities), such as hammer toes. Calluses can occur with friction to any area of the skin. They are more likely to develop in people who:  Work with their hands.  Wear shoes that fit poorly, are too tight, or are high-heeled.  Have toe deformities. What are the signs or symptoms? Symptoms of a corn or callus include:  A hard growth on the skin.  Pain or tenderness under the skin.  Redness and swelling.  Increased discomfort while wearing tight-fitting shoes, if your feet are affected. If a corn or callus becomes infected, symptoms may include:  Redness and swelling that gets worse.  Pain.  Fluid, blood, or pus draining from the corn or callus. How is this diagnosed? Corns and calluses may be diagnosed based on your symptoms, your medical history, and a physical exam. How is this treated? Treatment for corns and calluses may include:  Removing the cause of the friction or pressure. This may involve: ? Changing your shoes. ? Wearing shoe inserts (orthotics) or other protective layers in your shoes, such as a corn pad. ? Wearing gloves.  Applying medicine to the skin (topical medicine) to help soften skin in the hardened, thickened areas.  Removing layers of dead skin with a file to reduce the size of  the corn or callus.  Removing the corn or callus with a scalpel or laser.  Taking antibiotic medicines, if your corn or callus is infected.  Having surgery, if a toe deformity is the cause. Follow these instructions at home:   Take over-the-counter and prescription medicines only as told by your health care provider.  If you were prescribed an antibiotic, take it as told by your health care provider. Do not stop taking it even if your condition starts to improve.  Wear shoes that fit well. Avoid wearing high-heeled shoes and shoes that are too tight or too loose.  Wear any padding, protective layers, gloves, or orthotics as told by your health care provider.  Soak your hands or feet and then use a file or pumice stone to soften your corn or callus. Do this as told by your health care provider.  Check your corn or callus every day for symptoms of infection. Contact a health care provider if you:  Notice that your symptoms do not improve with treatment.  Have redness or swelling that gets worse.  Notice that your corn or callus becomes painful.  Have fluid, blood, or pus coming from your corn or callus.  Have new symptoms. Summary  Corns are small areas of thickened skin that occur on the top, sides, or tip of a toe.  Calluses are areas of thickened skin that can occur anywhere on the body, including the hands, fingers, palms, and soles of the feet. Calluses are usually larger  than corns.  Corns and calluses are caused by rubbing (friction) or pressure, such as from shoes that are too tight or do not fit properly.  Treatment may include wearing any padding, protective layers, gloves, or orthotics as told by your health care provider. This information is not intended to replace advice given to you by your health care provider. Make sure you discuss any questions you have with your health care provider. Document Revised: 06/20/2018 Document Reviewed: 01/11/2017 Elsevier Patient  Education  [place]  WHAT IS IT? An infection that lies within the keratin of your nail plate that is caused by a fungus.  WHY ME? Fungal infections affect all ages, sexes, races, and creeds.  There may be many factors that predispose you to a fungal infection such as age, coexisting medical conditions such as diabetes, or an autoimmune disease; stress, medications, fatigue, genetics, etc.  Bottom line: fungus thrives in a warm, moist environment and your shoes offer such a location.  IS IT CONTAGIOUS? Theoretically, yes.  You do not want to share shoes, nail clippers or files with someone who has fungal toenails.  Walking around barefoot in the same room or sleeping in the same bed is unlikely to transfer the organism.  It is important to realize, however, that fungus can spread easily from one nail to the next on the same foot.  HOW DO WE TREAT THIS?  There are several ways to treat this condition.  Treatment may depend on many factors such as age, medications, pregnancy, liver and kidney conditions, etc.  It is best to ask your doctor which options are available to you.  4. No treatment.   Unlike many other medical concerns, you can live with this condition.  However for many people this can be a painful condition and may lead to ingrown toenails or a bacterial infection.  It is recommended that you keep the nails cut short to help reduce the amount of fungal nail. 5. Topical treatment.  These range from herbal remedies to prescription strength nail lacquers.  About 40-50% effective, topicals require twice daily application for approximately 9 to 12 months or until an entirely new nail has grown out.  The most effective topicals are medical grade medications available through physicians offices. 6. Oral antifungal medications.  With an 80-90% cure rate, the most common oral medication requires 3 to 4 months of therapy and stays in your system for a year as  the new nail grows out.  Oral antifungal medications do require blood work to make sure it is a safe drug for you.  A liver function panel will be performed prior to starting the medication and after the first month of treatment.  It is important to have the blood work performed to avoid any harmful side effects.  In general, this medication safe but blood work is required. 7. Laser Therapy.  This treatment is performed by applying a specialized laser to the affected nail plate.  This therapy is noninvasive, fast, and non-painful.  It is not covered by insurance and is therefore, out of pocket.  The results have been very good with a 80-95% cure rate.  [facility] is the only practice in the area to offer this therapy. 8. Permanent Nail Avulsion.  Removing the entire nail so that a new nail will not grow back.

## 2019-07-19 DIAGNOSIS — D631 Anemia in chronic kidney disease: Secondary | ICD-10-CM | POA: Diagnosis not present

## 2019-07-19 DIAGNOSIS — N186 End stage renal disease: Secondary | ICD-10-CM | POA: Diagnosis not present

## 2019-07-19 DIAGNOSIS — Z992 Dependence on renal dialysis: Secondary | ICD-10-CM | POA: Diagnosis not present

## 2019-07-19 DIAGNOSIS — N2581 Secondary hyperparathyroidism of renal origin: Secondary | ICD-10-CM | POA: Diagnosis not present

## 2019-07-22 DIAGNOSIS — N186 End stage renal disease: Secondary | ICD-10-CM | POA: Diagnosis not present

## 2019-07-22 DIAGNOSIS — Z992 Dependence on renal dialysis: Secondary | ICD-10-CM | POA: Diagnosis not present

## 2019-07-22 DIAGNOSIS — N2581 Secondary hyperparathyroidism of renal origin: Secondary | ICD-10-CM | POA: Diagnosis not present

## 2019-07-24 DIAGNOSIS — N186 End stage renal disease: Secondary | ICD-10-CM | POA: Diagnosis not present

## 2019-07-24 DIAGNOSIS — N2581 Secondary hyperparathyroidism of renal origin: Secondary | ICD-10-CM | POA: Diagnosis not present

## 2019-07-24 DIAGNOSIS — D631 Anemia in chronic kidney disease: Secondary | ICD-10-CM | POA: Diagnosis not present

## 2019-07-24 DIAGNOSIS — Z992 Dependence on renal dialysis: Secondary | ICD-10-CM | POA: Diagnosis not present

## 2019-07-25 NOTE — Progress Notes (Signed)
Subjective: Dwayne Reed is a 64 y.o. male patient seen today at [facility]. Patient has h/o ESRD on hemodialysis and callus(es) b/l feet and painful mycotic toenails b/l that are difficult to trim. Pain interferes with ambulation. Aggravating factors include wearing enclosed shoe gear. Pain is relieved with periodic professional debridement.   His PCP is Clarene Reamer, FNP. Last visit was: 03/27/2019.   Patient Active Problem List   Diagnosis Date Noted  . Breakdown of surgically created AV fistula, init ([place]) 10/23/2017  . Decreased energy 10/23/2017  . Acute on chronic systolic CHF (congestive heart failure) ([place]) 07/26/2017  . ESRD on dialysis ([place]) 07/26/2017  . SOB (shortness of breath) 07/25/2017  . 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 COPD (chronic obstructive pulmonary disease) ([place]) 07/14/2016  . History of renal cell cancer 07/12/2016  . Olecranon bursitis 05/27/2016  . Malignant tumor of prostate ([place]) 09/18/2015  . Benign prostatic hyperplasia with lower urinary tract symptoms 08/05/2015  . Pre-transplant evaluation for kidney transplant 08/05/2015  . Acquired arteriovenous fistula ([place]) 04/30/2015  . ESRD on hemodialysis ([place]) 02/04/2015  . Hyperparathyroidism ([place]) 02/04/2015  . Hyperphosphatemia 02/04/2015  . Anemia of renal disease 02/04/2015  . History of transfusion 12/13/2014  . Elevated prostate specific antigen (PSA) 08/18/2012  . Essential hypertension 08/18/2012  . Gout 08/18/2012  . Acquired cyst of kidney 04/13/2012    Current Outpatient Medications on File Prior to Visit  Medication Sig Dispense Refill  . AURYXIA 1 GM 210 MG(Fe) tablet Take 3 tablets (630 mg total) by mouth 3 (three) times daily.    . B Complex-C-Zn-Folic Acid (DIALYVITE 315-QMGQ 15) 0.8 MG TABS TAKE 1 TABLET BY MOUTH EVERY DAY WITH THE EVENING MEAL    . carvedilol (COREG) 25 MG tablet Take 1 tablet by mouth 2 (two) times daily.  5  . NONFORMULARY OR COMPOUNDED ITEM  Antifungal solution: Terbinafine 3%, Fluconazole 2%, Tea Tree Oil 5%, Urea 10%, Ibuprofen 2% in DMSO suspension #67mL 1 each 3  . Zinc 100 MG TABS Take by mouth.     No current facility-administered medications on file prior to visit.    No Known Allergies  Objective: Physical Exam  General: Dwayne Reed is a pleasant 64 y.o. y.o. AA male, WD, WN in NAD. AAO x 3.   Vascular:  Neurovascular status unchanged b/l. Capillary fill time to digits <3 seconds b/l. Palpable DP pulses b/l. Palpable PT pulses b/l. Pedal hair absent b/l Skin temperature gradient within normal limits b/l. No edema noted b/l. No pain with calf compression b/l.  Dermatological:  Pedal skin with normal turgor, texture and tone bilaterally. No open wounds bilaterally. No interdigital macerations bilaterally. Toenails 1-5 b/l elongated, dystrophic, thickened, crumbly with subungual debris and tenderness to dorsal palpation. Hyperkeratotic lesion(s) submet head 1 left foot, submet head 2 left foot, submet head 2 right foot and submet head 5 left foot.  No erythema, no edema, no drainage, no flocculence.  Musculoskeletal:  Normal muscle strength 5/5 to all lower extremity muscle groups bilaterally. No pain crepitus or joint limitation noted with ROM b/l. Hallux valgus with bunion deformity noted b/l. Hammertoes noted to the 2-5 bilaterally.  Neurological:  Protective sensation intact 5/5 intact bilaterally with 10g monofilament b/l. Vibratory sensation intact b/l. Proprioception intact bilaterally.  Assessment and Plan:  1. Pain due to onychomycosis of toenails of both feet   2. Callus   3. Anemia in ESRD (end-stage renal disease) ([place])   4. ESRD on dialysis [facility])    -  Examined patient. -No new findings. No new orders. -Toenails 1-5 b/l were debrided in length and girth with sterile nail nippers and dremel without iatrogenic bleeding.  -Callus(es) submet head 1 left foot, submet head 2 left foot, submet head 2 right  foot and submet head 5 left foot pared utilizing sterile scalpel blade without complication or incident. Total number debrided =4. -Patient to continue soft, supportive shoe gear daily. -Patient to report any pedal injuries to medical professional immediately. -Patient/POA to call should there be question/concern in the interim.  Return in about 3 months (around 10/17/2019) for nail and callus trim.  [name], DPM

## 2019-07-26 DIAGNOSIS — Z992 Dependence on renal dialysis: Secondary | ICD-10-CM | POA: Diagnosis not present

## 2019-07-26 DIAGNOSIS — N186 End stage renal disease: Secondary | ICD-10-CM | POA: Diagnosis not present

## 2019-07-26 DIAGNOSIS — D631 Anemia in chronic kidney disease: Secondary | ICD-10-CM | POA: Diagnosis not present

## 2019-07-26 DIAGNOSIS — N2581 Secondary hyperparathyroidism of renal origin: Secondary | ICD-10-CM | POA: Diagnosis not present

## 2019-07-29 DIAGNOSIS — N2581 Secondary hyperparathyroidism of renal origin: Secondary | ICD-10-CM | POA: Diagnosis not present

## 2019-07-29 DIAGNOSIS — D631 Anemia in chronic kidney disease: Secondary | ICD-10-CM | POA: Diagnosis not present

## 2019-07-29 DIAGNOSIS — N186 End stage renal disease: Secondary | ICD-10-CM | POA: Diagnosis not present

## 2019-07-29 DIAGNOSIS — Z992 Dependence on renal dialysis: Secondary | ICD-10-CM | POA: Diagnosis not present

## 2019-07-31 DIAGNOSIS — Z992 Dependence on renal dialysis: Secondary | ICD-10-CM | POA: Diagnosis not present

## 2019-07-31 DIAGNOSIS — D631 Anemia in chronic kidney disease: Secondary | ICD-10-CM | POA: Diagnosis not present

## 2019-07-31 DIAGNOSIS — N2581 Secondary hyperparathyroidism of renal origin: Secondary | ICD-10-CM | POA: Diagnosis not present

## 2019-07-31 DIAGNOSIS — N186 End stage renal disease: Secondary | ICD-10-CM | POA: Diagnosis not present

## 2019-08-02 DIAGNOSIS — N2581 Secondary hyperparathyroidism of renal origin: Secondary | ICD-10-CM | POA: Diagnosis not present

## 2019-08-02 DIAGNOSIS — D631 Anemia in chronic kidney disease: Secondary | ICD-10-CM | POA: Diagnosis not present

## 2019-08-02 DIAGNOSIS — Z992 Dependence on renal dialysis: Secondary | ICD-10-CM | POA: Diagnosis not present

## 2019-08-02 DIAGNOSIS — N186 End stage renal disease: Secondary | ICD-10-CM | POA: Diagnosis not present

## 2019-08-05 DIAGNOSIS — N2581 Secondary hyperparathyroidism of renal origin: Secondary | ICD-10-CM | POA: Diagnosis not present

## 2019-08-05 DIAGNOSIS — Z992 Dependence on renal dialysis: Secondary | ICD-10-CM | POA: Diagnosis not present

## 2019-08-05 DIAGNOSIS — D631 Anemia in chronic kidney disease: Secondary | ICD-10-CM | POA: Diagnosis not present

## 2019-08-05 DIAGNOSIS — N186 End stage renal disease: Secondary | ICD-10-CM | POA: Diagnosis not present

## 2019-08-07 DIAGNOSIS — D631 Anemia in chronic kidney disease: Secondary | ICD-10-CM | POA: Diagnosis not present

## 2019-08-07 DIAGNOSIS — N2581 Secondary hyperparathyroidism of renal origin: Secondary | ICD-10-CM | POA: Diagnosis not present

## 2019-08-07 DIAGNOSIS — Z992 Dependence on renal dialysis: Secondary | ICD-10-CM | POA: Diagnosis not present

## 2019-08-07 DIAGNOSIS — N186 End stage renal disease: Secondary | ICD-10-CM | POA: Diagnosis not present

## 2019-08-09 DIAGNOSIS — D631 Anemia in chronic kidney disease: Secondary | ICD-10-CM | POA: Diagnosis not present

## 2019-08-09 DIAGNOSIS — Z992 Dependence on renal dialysis: Secondary | ICD-10-CM | POA: Diagnosis not present

## 2019-08-09 DIAGNOSIS — N2581 Secondary hyperparathyroidism of renal origin: Secondary | ICD-10-CM | POA: Diagnosis not present

## 2019-08-09 DIAGNOSIS — N186 End stage renal disease: Secondary | ICD-10-CM | POA: Diagnosis not present

## 2019-08-12 DIAGNOSIS — Z992 Dependence on renal dialysis: Secondary | ICD-10-CM | POA: Diagnosis not present

## 2019-08-12 DIAGNOSIS — D631 Anemia in chronic kidney disease: Secondary | ICD-10-CM | POA: Diagnosis not present

## 2019-08-12 DIAGNOSIS — N186 End stage renal disease: Secondary | ICD-10-CM | POA: Diagnosis not present

## 2019-08-12 DIAGNOSIS — N2581 Secondary hyperparathyroidism of renal origin: Secondary | ICD-10-CM | POA: Diagnosis not present

## 2019-08-13 DIAGNOSIS — Z992 Dependence on renal dialysis: Secondary | ICD-10-CM | POA: Diagnosis not present

## 2019-08-13 DIAGNOSIS — N186 End stage renal disease: Secondary | ICD-10-CM | POA: Diagnosis not present

## 2019-08-13 DIAGNOSIS — I129 Hypertensive chronic kidney disease with stage 1 through stage 4 chronic kidney disease, or unspecified chronic kidney disease: Secondary | ICD-10-CM | POA: Diagnosis not present

## 2019-08-14 DIAGNOSIS — D631 Anemia in chronic kidney disease: Secondary | ICD-10-CM | POA: Diagnosis not present

## 2019-08-14 DIAGNOSIS — N2581 Secondary hyperparathyroidism of renal origin: Secondary | ICD-10-CM | POA: Diagnosis not present

## 2019-08-14 DIAGNOSIS — Z992 Dependence on renal dialysis: Secondary | ICD-10-CM | POA: Diagnosis not present

## 2019-08-14 DIAGNOSIS — N186 End stage renal disease: Secondary | ICD-10-CM | POA: Diagnosis not present

## 2019-08-16 DIAGNOSIS — N186 End stage renal disease: Secondary | ICD-10-CM | POA: Diagnosis not present

## 2019-08-16 DIAGNOSIS — Z992 Dependence on renal dialysis: Secondary | ICD-10-CM | POA: Diagnosis not present

## 2019-08-16 DIAGNOSIS — N2581 Secondary hyperparathyroidism of renal origin: Secondary | ICD-10-CM | POA: Diagnosis not present

## 2019-08-16 DIAGNOSIS — D631 Anemia in chronic kidney disease: Secondary | ICD-10-CM | POA: Diagnosis not present

## 2019-08-19 DIAGNOSIS — Z992 Dependence on renal dialysis: Secondary | ICD-10-CM | POA: Diagnosis not present

## 2019-08-19 DIAGNOSIS — N2581 Secondary hyperparathyroidism of renal origin: Secondary | ICD-10-CM | POA: Diagnosis not present

## 2019-08-19 DIAGNOSIS — N186 End stage renal disease: Secondary | ICD-10-CM | POA: Diagnosis not present

## 2019-08-19 DIAGNOSIS — D631 Anemia in chronic kidney disease: Secondary | ICD-10-CM | POA: Diagnosis not present

## 2019-08-21 DIAGNOSIS — D631 Anemia in chronic kidney disease: Secondary | ICD-10-CM | POA: Diagnosis not present

## 2019-08-21 DIAGNOSIS — Z992 Dependence on renal dialysis: Secondary | ICD-10-CM | POA: Diagnosis not present

## 2019-08-21 DIAGNOSIS — N2581 Secondary hyperparathyroidism of renal origin: Secondary | ICD-10-CM | POA: Diagnosis not present

## 2019-08-21 DIAGNOSIS — N186 End stage renal disease: Secondary | ICD-10-CM | POA: Diagnosis not present

## 2019-08-23 DIAGNOSIS — Z992 Dependence on renal dialysis: Secondary | ICD-10-CM | POA: Diagnosis not present

## 2019-08-23 DIAGNOSIS — N2581 Secondary hyperparathyroidism of renal origin: Secondary | ICD-10-CM | POA: Diagnosis not present

## 2019-08-23 DIAGNOSIS — D631 Anemia in chronic kidney disease: Secondary | ICD-10-CM | POA: Diagnosis not present

## 2019-08-23 DIAGNOSIS — N186 End stage renal disease: Secondary | ICD-10-CM | POA: Diagnosis not present

## 2019-08-26 DIAGNOSIS — N2581 Secondary hyperparathyroidism of renal origin: Secondary | ICD-10-CM | POA: Diagnosis not present

## 2019-08-26 DIAGNOSIS — N186 End stage renal disease: Secondary | ICD-10-CM | POA: Diagnosis not present

## 2019-08-26 DIAGNOSIS — D631 Anemia in chronic kidney disease: Secondary | ICD-10-CM | POA: Diagnosis not present

## 2019-08-26 DIAGNOSIS — Z992 Dependence on renal dialysis: Secondary | ICD-10-CM | POA: Diagnosis not present

## 2019-08-28 DIAGNOSIS — N2581 Secondary hyperparathyroidism of renal origin: Secondary | ICD-10-CM | POA: Diagnosis not present

## 2019-08-28 DIAGNOSIS — N186 End stage renal disease: Secondary | ICD-10-CM | POA: Diagnosis not present

## 2019-08-28 DIAGNOSIS — D631 Anemia in chronic kidney disease: Secondary | ICD-10-CM | POA: Diagnosis not present

## 2019-08-28 DIAGNOSIS — Z992 Dependence on renal dialysis: Secondary | ICD-10-CM | POA: Diagnosis not present

## 2019-08-30 DIAGNOSIS — N2581 Secondary hyperparathyroidism of renal origin: Secondary | ICD-10-CM | POA: Diagnosis not present

## 2019-08-30 DIAGNOSIS — Z992 Dependence on renal dialysis: Secondary | ICD-10-CM | POA: Diagnosis not present

## 2019-08-30 DIAGNOSIS — N186 End stage renal disease: Secondary | ICD-10-CM | POA: Diagnosis not present

## 2019-08-30 DIAGNOSIS — D631 Anemia in chronic kidney disease: Secondary | ICD-10-CM | POA: Diagnosis not present

## 2019-09-02 DIAGNOSIS — Z992 Dependence on renal dialysis: Secondary | ICD-10-CM | POA: Diagnosis not present

## 2019-09-02 DIAGNOSIS — N2581 Secondary hyperparathyroidism of renal origin: Secondary | ICD-10-CM | POA: Diagnosis not present

## 2019-09-02 DIAGNOSIS — N186 End stage renal disease: Secondary | ICD-10-CM | POA: Diagnosis not present

## 2019-09-02 DIAGNOSIS — D631 Anemia in chronic kidney disease: Secondary | ICD-10-CM | POA: Diagnosis not present

## 2019-09-04 DIAGNOSIS — Z992 Dependence on renal dialysis: Secondary | ICD-10-CM | POA: Diagnosis not present

## 2019-09-04 DIAGNOSIS — N186 End stage renal disease: Secondary | ICD-10-CM | POA: Diagnosis not present

## 2019-09-04 DIAGNOSIS — D631 Anemia in chronic kidney disease: Secondary | ICD-10-CM | POA: Diagnosis not present

## 2019-09-04 DIAGNOSIS — N2581 Secondary hyperparathyroidism of renal origin: Secondary | ICD-10-CM | POA: Diagnosis not present

## 2019-09-06 DIAGNOSIS — Z992 Dependence on renal dialysis: Secondary | ICD-10-CM | POA: Diagnosis not present

## 2019-09-06 DIAGNOSIS — D631 Anemia in chronic kidney disease: Secondary | ICD-10-CM | POA: Diagnosis not present

## 2019-09-06 DIAGNOSIS — N2581 Secondary hyperparathyroidism of renal origin: Secondary | ICD-10-CM | POA: Diagnosis not present

## 2019-09-06 DIAGNOSIS — N186 End stage renal disease: Secondary | ICD-10-CM | POA: Diagnosis not present

## 2019-09-09 DIAGNOSIS — N186 End stage renal disease: Secondary | ICD-10-CM | POA: Diagnosis not present

## 2019-09-09 DIAGNOSIS — Z992 Dependence on renal dialysis: Secondary | ICD-10-CM | POA: Diagnosis not present

## 2019-09-09 DIAGNOSIS — D631 Anemia in chronic kidney disease: Secondary | ICD-10-CM | POA: Diagnosis not present

## 2019-09-09 DIAGNOSIS — N2581 Secondary hyperparathyroidism of renal origin: Secondary | ICD-10-CM | POA: Diagnosis not present

## 2019-09-11 DIAGNOSIS — D631 Anemia in chronic kidney disease: Secondary | ICD-10-CM | POA: Diagnosis not present

## 2019-09-11 DIAGNOSIS — N186 End stage renal disease: Secondary | ICD-10-CM | POA: Diagnosis not present

## 2019-09-11 DIAGNOSIS — Z992 Dependence on renal dialysis: Secondary | ICD-10-CM | POA: Diagnosis not present

## 2019-09-11 DIAGNOSIS — N2581 Secondary hyperparathyroidism of renal origin: Secondary | ICD-10-CM | POA: Diagnosis not present

## 2019-09-12 DIAGNOSIS — N186 End stage renal disease: Secondary | ICD-10-CM | POA: Diagnosis not present

## 2019-09-12 DIAGNOSIS — I129 Hypertensive chronic kidney disease with stage 1 through stage 4 chronic kidney disease, or unspecified chronic kidney disease: Secondary | ICD-10-CM | POA: Diagnosis not present

## 2019-09-12 DIAGNOSIS — Z992 Dependence on renal dialysis: Secondary | ICD-10-CM | POA: Diagnosis not present

## 2019-09-13 DIAGNOSIS — Z992 Dependence on renal dialysis: Secondary | ICD-10-CM | POA: Diagnosis not present

## 2019-09-13 DIAGNOSIS — N186 End stage renal disease: Secondary | ICD-10-CM | POA: Diagnosis not present

## 2019-09-13 DIAGNOSIS — Z23 Encounter for immunization: Secondary | ICD-10-CM | POA: Diagnosis not present

## 2019-09-13 DIAGNOSIS — N2581 Secondary hyperparathyroidism of renal origin: Secondary | ICD-10-CM | POA: Diagnosis not present

## 2019-09-16 DIAGNOSIS — N186 End stage renal disease: Secondary | ICD-10-CM | POA: Diagnosis not present

## 2019-09-16 DIAGNOSIS — Z23 Encounter for immunization: Secondary | ICD-10-CM | POA: Diagnosis not present

## 2019-09-16 DIAGNOSIS — Z992 Dependence on renal dialysis: Secondary | ICD-10-CM | POA: Diagnosis not present

## 2019-09-16 DIAGNOSIS — N2581 Secondary hyperparathyroidism of renal origin: Secondary | ICD-10-CM | POA: Diagnosis not present

## 2019-09-18 DIAGNOSIS — Z992 Dependence on renal dialysis: Secondary | ICD-10-CM | POA: Diagnosis not present

## 2019-09-18 DIAGNOSIS — Z23 Encounter for immunization: Secondary | ICD-10-CM | POA: Diagnosis not present

## 2019-09-18 DIAGNOSIS — N2581 Secondary hyperparathyroidism of renal origin: Secondary | ICD-10-CM | POA: Diagnosis not present

## 2019-09-18 DIAGNOSIS — N186 End stage renal disease: Secondary | ICD-10-CM | POA: Diagnosis not present

## 2019-09-20 DIAGNOSIS — N2581 Secondary hyperparathyroidism of renal origin: Secondary | ICD-10-CM | POA: Diagnosis not present

## 2019-09-20 DIAGNOSIS — N186 End stage renal disease: Secondary | ICD-10-CM | POA: Diagnosis not present

## 2019-09-20 DIAGNOSIS — Z23 Encounter for immunization: Secondary | ICD-10-CM | POA: Diagnosis not present

## 2019-09-20 DIAGNOSIS — Z992 Dependence on renal dialysis: Secondary | ICD-10-CM | POA: Diagnosis not present

## 2019-09-23 DIAGNOSIS — N2581 Secondary hyperparathyroidism of renal origin: Secondary | ICD-10-CM | POA: Diagnosis not present

## 2019-09-23 DIAGNOSIS — Z992 Dependence on renal dialysis: Secondary | ICD-10-CM | POA: Diagnosis not present

## 2019-09-23 DIAGNOSIS — N186 End stage renal disease: Secondary | ICD-10-CM | POA: Diagnosis not present

## 2019-09-23 DIAGNOSIS — Z23 Encounter for immunization: Secondary | ICD-10-CM | POA: Diagnosis not present

## 2019-09-25 ENCOUNTER — Ambulatory Visit: Payer: BC Managed Care – PPO | Admitting: Family Medicine

## 2019-09-25 DIAGNOSIS — N2581 Secondary hyperparathyroidism of renal origin: Secondary | ICD-10-CM | POA: Diagnosis not present

## 2019-09-25 DIAGNOSIS — Z992 Dependence on renal dialysis: Secondary | ICD-10-CM | POA: Diagnosis not present

## 2019-09-25 DIAGNOSIS — Z23 Encounter for immunization: Secondary | ICD-10-CM | POA: Diagnosis not present

## 2019-09-25 DIAGNOSIS — N186 End stage renal disease: Secondary | ICD-10-CM | POA: Diagnosis not present

## 2019-09-27 ENCOUNTER — Other Ambulatory Visit: Payer: Self-pay

## 2019-09-27 ENCOUNTER — Ambulatory Visit (INDEPENDENT_AMBULATORY_CARE_PROVIDER_SITE_OTHER): Payer: Medicare Other | Admitting: Family Medicine

## 2019-09-27 ENCOUNTER — Encounter: Payer: Self-pay | Admitting: Family Medicine

## 2019-09-27 VITALS — BP 140/72 | HR 89 | Temp 97.7°F | Ht 71.0 in | Wt 186.8 lb

## 2019-09-27 DIAGNOSIS — E7800 Pure hypercholesterolemia, unspecified: Secondary | ICD-10-CM

## 2019-09-27 DIAGNOSIS — Z720 Tobacco use: Secondary | ICD-10-CM

## 2019-09-27 DIAGNOSIS — Z23 Encounter for immunization: Secondary | ICD-10-CM | POA: Diagnosis not present

## 2019-09-27 DIAGNOSIS — Z992 Dependence on renal dialysis: Secondary | ICD-10-CM | POA: Diagnosis not present

## 2019-09-27 DIAGNOSIS — E1169 Type 2 diabetes mellitus with other specified complication: Secondary | ICD-10-CM

## 2019-09-27 DIAGNOSIS — E785 Hyperlipidemia, unspecified: Secondary | ICD-10-CM | POA: Diagnosis not present

## 2019-09-27 DIAGNOSIS — N2581 Secondary hyperparathyroidism of renal origin: Secondary | ICD-10-CM | POA: Diagnosis not present

## 2019-09-27 DIAGNOSIS — E663 Overweight: Secondary | ICD-10-CM | POA: Diagnosis not present

## 2019-09-27 DIAGNOSIS — S39012A Strain of muscle, fascia and tendon of lower back, initial encounter: Secondary | ICD-10-CM

## 2019-09-27 DIAGNOSIS — I10 Essential (primary) hypertension: Secondary | ICD-10-CM | POA: Diagnosis not present

## 2019-09-27 DIAGNOSIS — N186 End stage renal disease: Secondary | ICD-10-CM

## 2019-09-27 NOTE — Patient Instructions (Signed)
Good to see you today   I will notify you of lab results  Follow up in 6 months

## 2019-09-27 NOTE — Progress Notes (Signed)
Subjective:    Patient ID: Dwayne Reed, male    DOB: 1956/02/08, 64 y.o.   MRN: [id number]  HPI This is a 64 yo male who presents today for follow up of chronic medical conditions.  Has been doing pretty well.  Chronic kidney disease on dialysis-has had visit with nephrologist in the last couple of months.  Things are stable but unfortunately there is no likelihood that he will regain kidney function.  He reports that he tolerates dialysis okay, it does cause him to be fatigued.  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  Essential hypertension-currently on carvedilol 25 mg twice daily.   Elevated PSA/low level prostate cancer-being followed by urology annually  Tobacco abuse-smokes 3 cigarettes a day.  Has tried to quit but then he gained weight which bothered him.  Back pain-lower part of back, midline, no radiation, weakness.  Worse after prolonged sitting and stiff in the morning.  He does not do any exercise or stretching.   Review of Systems He denies headaches, visual changes, chest pain, shortness of breath, leg swelling    Objective:   Physical Exam Vitals reviewed.  Constitutional:      General: He is not in acute distress.    Appearance: Normal appearance. He is normal weight. He is not ill-appearing, toxic-appearing or diaphoretic.  HENT:     Head: Normocephalic.     Right Ear: External ear normal.     Left Ear: External ear normal.     Nose: Nose normal.  Eyes:     Conjunctiva/sclera: Conjunctivae normal.  Cardiovascular:     Rate and Rhythm: Normal rate and regular rhythm.     Heart sounds: Normal heart sounds.  Pulmonary:     Effort: Pulmonary effort is normal.     Breath sounds: Normal breath sounds.  Musculoskeletal:     Cervical back: Normal, normal range of motion and neck supple.     Thoracic back: Normal.     Lumbar back: Tenderness (bilateral paraspinal) present. No swelling or bony tenderness. Normal range of motion. Negative  right straight leg raise test and negative left straight leg raise test.     Right lower leg: No edema.     Left lower leg: No edema.  Skin:    General: Skin is warm and dry.  Neurological:     Mental Status: He is alert and oriented to person, place, and time.  Psychiatric:        Mood and Affect: Mood normal.        Behavior: Behavior normal.        Thought Content: Thought content normal.        Judgment: Judgment normal.       BP 140/72 (BP Location: Left Arm, Patient Position: Sitting, Cuff Size: Normal)   Pulse 89   Temp 97.7 F (36.5 C) (Temporal)   Ht 5\' 11"  (1.803 m)   Wt 186 lb 12.8 oz (84.7 kg)   SpO2 97%   BMI 26.05 kg/m  Wt Readings from Last 3 Encounters:  09/27/19 186 lb 12.8 oz (84.7 kg)  04/30/19 187 lb (84.8 kg)  03/27/19 184 lb 6.4 oz (83.6 kg)   BP Readings from Last 3 Encounters:  09/27/19 140/72  04/30/19 116/76  03/27/19 (!) 88/50        Assessment & Plan:  1. Hypercholesterolemia - Comprehensive metabolic panel - Lipid Panel  2. Essential hypertension - blood pressure acceptable  - CBC  with Differential - Comprehensive metabolic panel  3. ESRD on dialysis [facility]) -Continue follow-up with nephrology - CBC with Differential - Comprehensive metabolic panel - Vitamin D, 25-hydroxy  4. Overweight - Vitamin D, 25-hydroxy  5. Strain of lumbar region, initial encounter -No worrisome findings on history or physical exam.  Discussed importance of regular exercise and stretching  6. Tobacco abuse -He is a light smoker but we discussed long-term health effects and I encouraged him to consider cessation  -Follow-up in 6 months  This visit occurred during the SARS-CoV-2 public health emergency.  Safety protocols were in place, including screening questions prior to the visit, additional usage of staff PPE, and extensive cleaning of exam room while observing appropriate contact time as indicated for disinfecting solutions.      Clarene Reamer, FNP-BC  [facility]  09/27/2019 5:30 PM

## 2019-09-28 LAB — COMPREHENSIVE METABOLIC PANEL
AG Ratio: 1.4 (calc) (ref 1.0–2.5)
ALT: 12 U/L (ref 9–46)
AST: 10 U/L (ref 10–35)
Albumin: 4.1 g/dL (ref 3.6–5.1)
Alkaline phosphatase (APISO): 79 U/L (ref 35–144)
BUN/Creatinine Ratio: 4 (calc) — ABNORMAL LOW (ref 6–22)
BUN: 25 mg/dL (ref 7–25)
CO2: 32 mmol/L (ref 20–32)
Calcium: 8.7 mg/dL (ref 8.6–10.3)
Chloride: 92 mmol/L — ABNORMAL LOW (ref 98–110)
Creat: 7.14 mg/dL — ABNORMAL HIGH (ref 0.70–1.25)
Globulin: 3 g/dL (calc) (ref 1.9–3.7)
Glucose, Bld: 98 mg/dL (ref 65–99)
Potassium: 3.8 mmol/L (ref 3.5–5.3)
Sodium: 137 mmol/L (ref 135–146)
Total Bilirubin: 0.6 mg/dL (ref 0.2–1.2)
Total Protein: 7.1 g/dL (ref 6.1–8.1)

## 2019-09-28 LAB — CBC WITH DIFFERENTIAL/PLATELET
Absolute Monocytes: 612 cells/uL (ref 200–950)
Basophils Absolute: 41 cells/uL (ref 0–200)
Basophils Relative: 0.9 %
Eosinophils Absolute: 322 cells/uL (ref 15–500)
Eosinophils Relative: 7 %
HCT: 35.6 % — ABNORMAL LOW (ref 38.5–50.0)
Hemoglobin: 12 g/dL — ABNORMAL LOW (ref 13.2–17.1)
Lymphs Abs: 975 cells/uL (ref 850–3900)
MCH: 32.7 pg (ref 27.0–33.0)
MCHC: 33.7 g/dL (ref 32.0–36.0)
MCV: 97 fL (ref 80.0–100.0)
MPV: 9.9 fL (ref 7.5–12.5)
Monocytes Relative: 13.3 %
Neutro Abs: 2650 cells/uL (ref 1500–7800)
Neutrophils Relative %: 57.6 %
Platelets: 183 10*3/uL (ref 140–400)
RBC: 3.67 10*6/uL — ABNORMAL LOW (ref 4.20–5.80)
RDW: 14.4 % (ref 11.0–15.0)
Total Lymphocyte: 21.2 %
WBC: 4.6 10*3/uL (ref 3.8–10.8)

## 2019-09-28 LAB — LIPID PANEL
Cholesterol: 207 mg/dL — ABNORMAL HIGH (ref <200)
HDL: 44 mg/dL (ref >=40)
LDL Cholesterol (Calc): 112 mg/dL (calc) — ABNORMAL HIGH
Non-HDL Cholesterol (Calc): 163 mg/dL (calc) — ABNORMAL HIGH (ref <130)
Total CHOL/HDL Ratio: 4.7 (calc) (ref <5.0)
Triglycerides: 359 mg/dL — ABNORMAL HIGH (ref <150)

## 2019-09-28 LAB — VITAMIN D 25 HYDROXY (VIT D DEFICIENCY, FRACTURES): Vit D, 25-Hydroxy: 34 ng/mL (ref 30–100)

## 2019-09-30 DIAGNOSIS — Z23 Encounter for immunization: Secondary | ICD-10-CM | POA: Diagnosis not present

## 2019-09-30 DIAGNOSIS — Z992 Dependence on renal dialysis: Secondary | ICD-10-CM | POA: Diagnosis not present

## 2019-09-30 DIAGNOSIS — N186 End stage renal disease: Secondary | ICD-10-CM | POA: Diagnosis not present

## 2019-09-30 DIAGNOSIS — N2581 Secondary hyperparathyroidism of renal origin: Secondary | ICD-10-CM | POA: Diagnosis not present

## 2019-10-02 DIAGNOSIS — Z23 Encounter for immunization: Secondary | ICD-10-CM | POA: Diagnosis not present

## 2019-10-02 DIAGNOSIS — N2581 Secondary hyperparathyroidism of renal origin: Secondary | ICD-10-CM | POA: Diagnosis not present

## 2019-10-02 DIAGNOSIS — Z992 Dependence on renal dialysis: Secondary | ICD-10-CM | POA: Diagnosis not present

## 2019-10-02 DIAGNOSIS — N186 End stage renal disease: Secondary | ICD-10-CM | POA: Diagnosis not present

## 2019-10-04 DIAGNOSIS — N2581 Secondary hyperparathyroidism of renal origin: Secondary | ICD-10-CM | POA: Diagnosis not present

## 2019-10-04 DIAGNOSIS — Z992 Dependence on renal dialysis: Secondary | ICD-10-CM | POA: Diagnosis not present

## 2019-10-04 DIAGNOSIS — Z23 Encounter for immunization: Secondary | ICD-10-CM | POA: Diagnosis not present

## 2019-10-04 DIAGNOSIS — N186 End stage renal disease: Secondary | ICD-10-CM | POA: Diagnosis not present

## 2019-10-07 DIAGNOSIS — Z992 Dependence on renal dialysis: Secondary | ICD-10-CM | POA: Diagnosis not present

## 2019-10-07 DIAGNOSIS — Z23 Encounter for immunization: Secondary | ICD-10-CM | POA: Diagnosis not present

## 2019-10-07 DIAGNOSIS — N186 End stage renal disease: Secondary | ICD-10-CM | POA: Diagnosis not present

## 2019-10-07 DIAGNOSIS — N2581 Secondary hyperparathyroidism of renal origin: Secondary | ICD-10-CM | POA: Diagnosis not present

## 2019-10-09 DIAGNOSIS — Z992 Dependence on renal dialysis: Secondary | ICD-10-CM | POA: Diagnosis not present

## 2019-10-09 DIAGNOSIS — N2581 Secondary hyperparathyroidism of renal origin: Secondary | ICD-10-CM | POA: Diagnosis not present

## 2019-10-09 DIAGNOSIS — Z23 Encounter for immunization: Secondary | ICD-10-CM | POA: Diagnosis not present

## 2019-10-09 DIAGNOSIS — N186 End stage renal disease: Secondary | ICD-10-CM | POA: Diagnosis not present

## 2019-10-11 DIAGNOSIS — N2581 Secondary hyperparathyroidism of renal origin: Secondary | ICD-10-CM | POA: Diagnosis not present

## 2019-10-11 DIAGNOSIS — N186 End stage renal disease: Secondary | ICD-10-CM | POA: Diagnosis not present

## 2019-10-11 DIAGNOSIS — Z992 Dependence on renal dialysis: Secondary | ICD-10-CM | POA: Diagnosis not present

## 2019-10-11 DIAGNOSIS — Z23 Encounter for immunization: Secondary | ICD-10-CM | POA: Diagnosis not present

## 2019-10-13 DIAGNOSIS — N186 End stage renal disease: Secondary | ICD-10-CM | POA: Diagnosis not present

## 2019-10-13 DIAGNOSIS — Z992 Dependence on renal dialysis: Secondary | ICD-10-CM | POA: Diagnosis not present

## 2019-10-13 DIAGNOSIS — I129 Hypertensive chronic kidney disease with stage 1 through stage 4 chronic kidney disease, or unspecified chronic kidney disease: Secondary | ICD-10-CM | POA: Diagnosis not present

## 2019-10-14 DIAGNOSIS — N186 End stage renal disease: Secondary | ICD-10-CM | POA: Diagnosis not present

## 2019-10-14 DIAGNOSIS — Z992 Dependence on renal dialysis: Secondary | ICD-10-CM | POA: Diagnosis not present

## 2019-10-14 DIAGNOSIS — D631 Anemia in chronic kidney disease: Secondary | ICD-10-CM | POA: Diagnosis not present

## 2019-10-14 DIAGNOSIS — N2581 Secondary hyperparathyroidism of renal origin: Secondary | ICD-10-CM | POA: Diagnosis not present

## 2019-10-16 ENCOUNTER — Ambulatory Visit: Payer: Medicare Other | Admitting: Podiatry

## 2019-10-16 DIAGNOSIS — Z992 Dependence on renal dialysis: Secondary | ICD-10-CM | POA: Diagnosis not present

## 2019-10-16 DIAGNOSIS — D631 Anemia in chronic kidney disease: Secondary | ICD-10-CM | POA: Diagnosis not present

## 2019-10-16 DIAGNOSIS — N2581 Secondary hyperparathyroidism of renal origin: Secondary | ICD-10-CM | POA: Diagnosis not present

## 2019-10-16 DIAGNOSIS — N186 End stage renal disease: Secondary | ICD-10-CM | POA: Diagnosis not present

## 2019-10-18 DIAGNOSIS — D631 Anemia in chronic kidney disease: Secondary | ICD-10-CM | POA: Diagnosis not present

## 2019-10-18 DIAGNOSIS — N2581 Secondary hyperparathyroidism of renal origin: Secondary | ICD-10-CM | POA: Diagnosis not present

## 2019-10-18 DIAGNOSIS — Z992 Dependence on renal dialysis: Secondary | ICD-10-CM | POA: Diagnosis not present

## 2019-10-18 DIAGNOSIS — N186 End stage renal disease: Secondary | ICD-10-CM | POA: Diagnosis not present

## 2019-10-21 DIAGNOSIS — Z992 Dependence on renal dialysis: Secondary | ICD-10-CM | POA: Diagnosis not present

## 2019-10-21 DIAGNOSIS — N2581 Secondary hyperparathyroidism of renal origin: Secondary | ICD-10-CM | POA: Diagnosis not present

## 2019-10-21 DIAGNOSIS — N186 End stage renal disease: Secondary | ICD-10-CM | POA: Diagnosis not present

## 2019-10-21 DIAGNOSIS — D631 Anemia in chronic kidney disease: Secondary | ICD-10-CM | POA: Diagnosis not present

## 2019-10-23 DIAGNOSIS — N2581 Secondary hyperparathyroidism of renal origin: Secondary | ICD-10-CM | POA: Diagnosis not present

## 2019-10-23 DIAGNOSIS — D631 Anemia in chronic kidney disease: Secondary | ICD-10-CM | POA: Diagnosis not present

## 2019-10-23 DIAGNOSIS — Z992 Dependence on renal dialysis: Secondary | ICD-10-CM | POA: Diagnosis not present

## 2019-10-23 DIAGNOSIS — N186 End stage renal disease: Secondary | ICD-10-CM | POA: Diagnosis not present

## 2019-10-25 DIAGNOSIS — N186 End stage renal disease: Secondary | ICD-10-CM | POA: Diagnosis not present

## 2019-10-25 DIAGNOSIS — N2581 Secondary hyperparathyroidism of renal origin: Secondary | ICD-10-CM | POA: Diagnosis not present

## 2019-10-25 DIAGNOSIS — D631 Anemia in chronic kidney disease: Secondary | ICD-10-CM | POA: Diagnosis not present

## 2019-10-25 DIAGNOSIS — Z992 Dependence on renal dialysis: Secondary | ICD-10-CM | POA: Diagnosis not present

## 2019-10-29 DIAGNOSIS — N186 End stage renal disease: Secondary | ICD-10-CM | POA: Diagnosis not present

## 2019-10-29 DIAGNOSIS — N2581 Secondary hyperparathyroidism of renal origin: Secondary | ICD-10-CM | POA: Diagnosis not present

## 2019-10-29 DIAGNOSIS — Z992 Dependence on renal dialysis: Secondary | ICD-10-CM | POA: Diagnosis not present

## 2019-10-31 DIAGNOSIS — Z992 Dependence on renal dialysis: Secondary | ICD-10-CM | POA: Diagnosis not present

## 2019-10-31 DIAGNOSIS — N186 End stage renal disease: Secondary | ICD-10-CM | POA: Diagnosis not present

## 2019-10-31 DIAGNOSIS — N2581 Secondary hyperparathyroidism of renal origin: Secondary | ICD-10-CM | POA: Diagnosis not present

## 2019-11-02 DIAGNOSIS — Z992 Dependence on renal dialysis: Secondary | ICD-10-CM | POA: Diagnosis not present

## 2019-11-02 DIAGNOSIS — N2581 Secondary hyperparathyroidism of renal origin: Secondary | ICD-10-CM | POA: Diagnosis not present

## 2019-11-02 DIAGNOSIS — N186 End stage renal disease: Secondary | ICD-10-CM | POA: Diagnosis not present

## 2019-11-05 DIAGNOSIS — N2581 Secondary hyperparathyroidism of renal origin: Secondary | ICD-10-CM | POA: Diagnosis not present

## 2019-11-05 DIAGNOSIS — Z992 Dependence on renal dialysis: Secondary | ICD-10-CM | POA: Diagnosis not present

## 2019-11-05 DIAGNOSIS — N186 End stage renal disease: Secondary | ICD-10-CM | POA: Diagnosis not present

## 2019-11-07 DIAGNOSIS — N2581 Secondary hyperparathyroidism of renal origin: Secondary | ICD-10-CM | POA: Diagnosis not present

## 2019-11-07 DIAGNOSIS — N186 End stage renal disease: Secondary | ICD-10-CM | POA: Diagnosis not present

## 2019-11-07 DIAGNOSIS — Z992 Dependence on renal dialysis: Secondary | ICD-10-CM | POA: Diagnosis not present

## 2019-11-09 DIAGNOSIS — Z992 Dependence on renal dialysis: Secondary | ICD-10-CM | POA: Diagnosis not present

## 2019-11-09 DIAGNOSIS — N2581 Secondary hyperparathyroidism of renal origin: Secondary | ICD-10-CM | POA: Diagnosis not present

## 2019-11-09 DIAGNOSIS — N186 End stage renal disease: Secondary | ICD-10-CM | POA: Diagnosis not present

## 2019-11-12 DIAGNOSIS — N186 End stage renal disease: Secondary | ICD-10-CM | POA: Diagnosis not present

## 2019-11-12 DIAGNOSIS — Z992 Dependence on renal dialysis: Secondary | ICD-10-CM | POA: Diagnosis not present

## 2019-11-12 DIAGNOSIS — N2581 Secondary hyperparathyroidism of renal origin: Secondary | ICD-10-CM | POA: Diagnosis not present

## 2019-11-13 DIAGNOSIS — N186 End stage renal disease: Secondary | ICD-10-CM | POA: Diagnosis not present

## 2019-11-13 DIAGNOSIS — Z992 Dependence on renal dialysis: Secondary | ICD-10-CM | POA: Diagnosis not present

## 2019-11-13 DIAGNOSIS — I129 Hypertensive chronic kidney disease with stage 1 through stage 4 chronic kidney disease, or unspecified chronic kidney disease: Secondary | ICD-10-CM | POA: Diagnosis not present

## 2019-11-14 DIAGNOSIS — N2581 Secondary hyperparathyroidism of renal origin: Secondary | ICD-10-CM | POA: Diagnosis not present

## 2019-11-14 DIAGNOSIS — Z992 Dependence on renal dialysis: Secondary | ICD-10-CM | POA: Diagnosis not present

## 2019-11-14 DIAGNOSIS — N186 End stage renal disease: Secondary | ICD-10-CM | POA: Diagnosis not present

## 2019-11-15 DIAGNOSIS — Z992 Dependence on renal dialysis: Secondary | ICD-10-CM | POA: Diagnosis not present

## 2019-11-15 DIAGNOSIS — N2581 Secondary hyperparathyroidism of renal origin: Secondary | ICD-10-CM | POA: Diagnosis not present

## 2019-11-15 DIAGNOSIS — N186 End stage renal disease: Secondary | ICD-10-CM | POA: Diagnosis not present

## 2019-11-19 DIAGNOSIS — N186 End stage renal disease: Secondary | ICD-10-CM | POA: Diagnosis not present

## 2019-11-19 DIAGNOSIS — N2581 Secondary hyperparathyroidism of renal origin: Secondary | ICD-10-CM | POA: Diagnosis not present

## 2019-11-19 DIAGNOSIS — Z992 Dependence on renal dialysis: Secondary | ICD-10-CM | POA: Diagnosis not present

## 2019-11-21 DIAGNOSIS — N2581 Secondary hyperparathyroidism of renal origin: Secondary | ICD-10-CM | POA: Diagnosis not present

## 2019-11-21 DIAGNOSIS — Z992 Dependence on renal dialysis: Secondary | ICD-10-CM | POA: Diagnosis not present

## 2019-11-21 DIAGNOSIS — N186 End stage renal disease: Secondary | ICD-10-CM | POA: Diagnosis not present

## 2019-11-23 DIAGNOSIS — N2581 Secondary hyperparathyroidism of renal origin: Secondary | ICD-10-CM | POA: Diagnosis not present

## 2019-11-23 DIAGNOSIS — Z992 Dependence on renal dialysis: Secondary | ICD-10-CM | POA: Diagnosis not present

## 2019-11-23 DIAGNOSIS — N186 End stage renal disease: Secondary | ICD-10-CM | POA: Diagnosis not present

## 2019-11-26 DIAGNOSIS — Z992 Dependence on renal dialysis: Secondary | ICD-10-CM | POA: Diagnosis not present

## 2019-11-26 DIAGNOSIS — N2581 Secondary hyperparathyroidism of renal origin: Secondary | ICD-10-CM | POA: Diagnosis not present

## 2019-11-26 DIAGNOSIS — N186 End stage renal disease: Secondary | ICD-10-CM | POA: Diagnosis not present

## 2019-11-28 DIAGNOSIS — N2581 Secondary hyperparathyroidism of renal origin: Secondary | ICD-10-CM | POA: Diagnosis not present

## 2019-11-28 DIAGNOSIS — Z992 Dependence on renal dialysis: Secondary | ICD-10-CM | POA: Diagnosis not present

## 2019-11-28 DIAGNOSIS — N186 End stage renal disease: Secondary | ICD-10-CM | POA: Diagnosis not present

## 2019-11-30 DIAGNOSIS — N2581 Secondary hyperparathyroidism of renal origin: Secondary | ICD-10-CM | POA: Diagnosis not present

## 2019-11-30 DIAGNOSIS — N186 End stage renal disease: Secondary | ICD-10-CM | POA: Diagnosis not present

## 2019-11-30 DIAGNOSIS — Z992 Dependence on renal dialysis: Secondary | ICD-10-CM | POA: Diagnosis not present

## 2019-12-03 DIAGNOSIS — Z992 Dependence on renal dialysis: Secondary | ICD-10-CM | POA: Diagnosis not present

## 2019-12-03 DIAGNOSIS — N2581 Secondary hyperparathyroidism of renal origin: Secondary | ICD-10-CM | POA: Diagnosis not present

## 2019-12-03 DIAGNOSIS — N186 End stage renal disease: Secondary | ICD-10-CM | POA: Diagnosis not present

## 2019-12-05 DIAGNOSIS — Z992 Dependence on renal dialysis: Secondary | ICD-10-CM | POA: Diagnosis not present

## 2019-12-05 DIAGNOSIS — N2581 Secondary hyperparathyroidism of renal origin: Secondary | ICD-10-CM | POA: Diagnosis not present

## 2019-12-05 DIAGNOSIS — N186 End stage renal disease: Secondary | ICD-10-CM | POA: Diagnosis not present

## 2019-12-07 DIAGNOSIS — N2581 Secondary hyperparathyroidism of renal origin: Secondary | ICD-10-CM | POA: Diagnosis not present

## 2019-12-07 DIAGNOSIS — N186 End stage renal disease: Secondary | ICD-10-CM | POA: Diagnosis not present

## 2019-12-07 DIAGNOSIS — Z992 Dependence on renal dialysis: Secondary | ICD-10-CM | POA: Diagnosis not present

## 2019-12-10 DIAGNOSIS — Z992 Dependence on renal dialysis: Secondary | ICD-10-CM | POA: Diagnosis not present

## 2019-12-10 DIAGNOSIS — N186 End stage renal disease: Secondary | ICD-10-CM | POA: Diagnosis not present

## 2019-12-10 DIAGNOSIS — N2581 Secondary hyperparathyroidism of renal origin: Secondary | ICD-10-CM | POA: Diagnosis not present

## 2019-12-12 DIAGNOSIS — N186 End stage renal disease: Secondary | ICD-10-CM | POA: Diagnosis not present

## 2019-12-12 DIAGNOSIS — N2581 Secondary hyperparathyroidism of renal origin: Secondary | ICD-10-CM | POA: Diagnosis not present

## 2019-12-12 DIAGNOSIS — Z992 Dependence on renal dialysis: Secondary | ICD-10-CM | POA: Diagnosis not present

## 2019-12-13 DIAGNOSIS — Z992 Dependence on renal dialysis: Secondary | ICD-10-CM | POA: Diagnosis not present

## 2019-12-13 DIAGNOSIS — N186 End stage renal disease: Secondary | ICD-10-CM | POA: Diagnosis not present

## 2019-12-13 DIAGNOSIS — I129 Hypertensive chronic kidney disease with stage 1 through stage 4 chronic kidney disease, or unspecified chronic kidney disease: Secondary | ICD-10-CM | POA: Diagnosis not present

## 2019-12-14 DIAGNOSIS — N2581 Secondary hyperparathyroidism of renal origin: Secondary | ICD-10-CM | POA: Diagnosis not present

## 2019-12-14 DIAGNOSIS — N186 End stage renal disease: Secondary | ICD-10-CM | POA: Diagnosis not present

## 2019-12-14 DIAGNOSIS — Z992 Dependence on renal dialysis: Secondary | ICD-10-CM | POA: Diagnosis not present

## 2019-12-14 DIAGNOSIS — Z23 Encounter for immunization: Secondary | ICD-10-CM | POA: Diagnosis not present

## 2019-12-17 DIAGNOSIS — Z992 Dependence on renal dialysis: Secondary | ICD-10-CM | POA: Diagnosis not present

## 2019-12-17 DIAGNOSIS — N2581 Secondary hyperparathyroidism of renal origin: Secondary | ICD-10-CM | POA: Diagnosis not present

## 2019-12-17 DIAGNOSIS — Z23 Encounter for immunization: Secondary | ICD-10-CM | POA: Diagnosis not present

## 2019-12-17 DIAGNOSIS — N186 End stage renal disease: Secondary | ICD-10-CM | POA: Diagnosis not present

## 2019-12-19 DIAGNOSIS — N186 End stage renal disease: Secondary | ICD-10-CM | POA: Diagnosis not present

## 2019-12-19 DIAGNOSIS — N2581 Secondary hyperparathyroidism of renal origin: Secondary | ICD-10-CM | POA: Diagnosis not present

## 2019-12-19 DIAGNOSIS — Z992 Dependence on renal dialysis: Secondary | ICD-10-CM | POA: Diagnosis not present

## 2019-12-19 DIAGNOSIS — Z23 Encounter for immunization: Secondary | ICD-10-CM | POA: Diagnosis not present

## 2019-12-21 DIAGNOSIS — N186 End stage renal disease: Secondary | ICD-10-CM | POA: Diagnosis not present

## 2019-12-21 DIAGNOSIS — N2581 Secondary hyperparathyroidism of renal origin: Secondary | ICD-10-CM | POA: Diagnosis not present

## 2019-12-21 DIAGNOSIS — Z992 Dependence on renal dialysis: Secondary | ICD-10-CM | POA: Diagnosis not present

## 2019-12-21 DIAGNOSIS — Z23 Encounter for immunization: Secondary | ICD-10-CM | POA: Diagnosis not present

## 2019-12-24 DIAGNOSIS — N2581 Secondary hyperparathyroidism of renal origin: Secondary | ICD-10-CM | POA: Diagnosis not present

## 2019-12-24 DIAGNOSIS — Z23 Encounter for immunization: Secondary | ICD-10-CM | POA: Diagnosis not present

## 2019-12-24 DIAGNOSIS — Z992 Dependence on renal dialysis: Secondary | ICD-10-CM | POA: Diagnosis not present

## 2019-12-24 DIAGNOSIS — N186 End stage renal disease: Secondary | ICD-10-CM | POA: Diagnosis not present

## 2019-12-26 DIAGNOSIS — Z992 Dependence on renal dialysis: Secondary | ICD-10-CM | POA: Diagnosis not present

## 2019-12-26 DIAGNOSIS — N186 End stage renal disease: Secondary | ICD-10-CM | POA: Diagnosis not present

## 2019-12-26 DIAGNOSIS — Z23 Encounter for immunization: Secondary | ICD-10-CM | POA: Diagnosis not present

## 2019-12-26 DIAGNOSIS — N2581 Secondary hyperparathyroidism of renal origin: Secondary | ICD-10-CM | POA: Diagnosis not present

## 2019-12-28 DIAGNOSIS — Z23 Encounter for immunization: Secondary | ICD-10-CM | POA: Diagnosis not present

## 2019-12-28 DIAGNOSIS — Z992 Dependence on renal dialysis: Secondary | ICD-10-CM | POA: Diagnosis not present

## 2019-12-28 DIAGNOSIS — N186 End stage renal disease: Secondary | ICD-10-CM | POA: Diagnosis not present

## 2019-12-28 DIAGNOSIS — N2581 Secondary hyperparathyroidism of renal origin: Secondary | ICD-10-CM | POA: Diagnosis not present

## 2019-12-31 DIAGNOSIS — N186 End stage renal disease: Secondary | ICD-10-CM | POA: Diagnosis not present

## 2019-12-31 DIAGNOSIS — N2581 Secondary hyperparathyroidism of renal origin: Secondary | ICD-10-CM | POA: Diagnosis not present

## 2019-12-31 DIAGNOSIS — Z992 Dependence on renal dialysis: Secondary | ICD-10-CM | POA: Diagnosis not present

## 2019-12-31 DIAGNOSIS — Z23 Encounter for immunization: Secondary | ICD-10-CM | POA: Diagnosis not present

## 2020-01-02 DIAGNOSIS — Z992 Dependence on renal dialysis: Secondary | ICD-10-CM | POA: Diagnosis not present

## 2020-01-02 DIAGNOSIS — N2581 Secondary hyperparathyroidism of renal origin: Secondary | ICD-10-CM | POA: Diagnosis not present

## 2020-01-02 DIAGNOSIS — Z23 Encounter for immunization: Secondary | ICD-10-CM | POA: Diagnosis not present

## 2020-01-02 DIAGNOSIS — N186 End stage renal disease: Secondary | ICD-10-CM | POA: Diagnosis not present

## 2020-01-03 DIAGNOSIS — Z992 Dependence on renal dialysis: Secondary | ICD-10-CM | POA: Diagnosis not present

## 2020-01-03 DIAGNOSIS — N2581 Secondary hyperparathyroidism of renal origin: Secondary | ICD-10-CM | POA: Diagnosis not present

## 2020-01-03 DIAGNOSIS — Z23 Encounter for immunization: Secondary | ICD-10-CM | POA: Diagnosis not present

## 2020-01-03 DIAGNOSIS — N186 End stage renal disease: Secondary | ICD-10-CM | POA: Diagnosis not present

## 2020-01-07 DIAGNOSIS — N2581 Secondary hyperparathyroidism of renal origin: Secondary | ICD-10-CM | POA: Diagnosis not present

## 2020-01-07 DIAGNOSIS — Z992 Dependence on renal dialysis: Secondary | ICD-10-CM | POA: Diagnosis not present

## 2020-01-07 DIAGNOSIS — Z23 Encounter for immunization: Secondary | ICD-10-CM | POA: Diagnosis not present

## 2020-01-07 DIAGNOSIS — N186 End stage renal disease: Secondary | ICD-10-CM | POA: Diagnosis not present

## 2020-01-09 DIAGNOSIS — Z992 Dependence on renal dialysis: Secondary | ICD-10-CM | POA: Diagnosis not present

## 2020-01-09 DIAGNOSIS — N2581 Secondary hyperparathyroidism of renal origin: Secondary | ICD-10-CM | POA: Diagnosis not present

## 2020-01-09 DIAGNOSIS — N186 End stage renal disease: Secondary | ICD-10-CM | POA: Diagnosis not present

## 2020-01-09 DIAGNOSIS — Z23 Encounter for immunization: Secondary | ICD-10-CM | POA: Diagnosis not present

## 2020-01-11 DIAGNOSIS — Z992 Dependence on renal dialysis: Secondary | ICD-10-CM | POA: Diagnosis not present

## 2020-01-11 DIAGNOSIS — N2581 Secondary hyperparathyroidism of renal origin: Secondary | ICD-10-CM | POA: Diagnosis not present

## 2020-01-11 DIAGNOSIS — N186 End stage renal disease: Secondary | ICD-10-CM | POA: Diagnosis not present

## 2020-01-11 DIAGNOSIS — Z23 Encounter for immunization: Secondary | ICD-10-CM | POA: Diagnosis not present

## 2020-01-13 DIAGNOSIS — Z992 Dependence on renal dialysis: Secondary | ICD-10-CM | POA: Diagnosis not present

## 2020-01-13 DIAGNOSIS — I129 Hypertensive chronic kidney disease with stage 1 through stage 4 chronic kidney disease, or unspecified chronic kidney disease: Secondary | ICD-10-CM | POA: Diagnosis not present

## 2020-01-13 DIAGNOSIS — N186 End stage renal disease: Secondary | ICD-10-CM | POA: Diagnosis not present

## 2020-01-14 DIAGNOSIS — N2581 Secondary hyperparathyroidism of renal origin: Secondary | ICD-10-CM | POA: Diagnosis not present

## 2020-01-14 DIAGNOSIS — D631 Anemia in chronic kidney disease: Secondary | ICD-10-CM | POA: Diagnosis not present

## 2020-01-14 DIAGNOSIS — Z23 Encounter for immunization: Secondary | ICD-10-CM | POA: Diagnosis not present

## 2020-01-14 DIAGNOSIS — Z992 Dependence on renal dialysis: Secondary | ICD-10-CM | POA: Diagnosis not present

## 2020-01-14 DIAGNOSIS — N186 End stage renal disease: Secondary | ICD-10-CM | POA: Diagnosis not present

## 2020-01-16 DIAGNOSIS — Z992 Dependence on renal dialysis: Secondary | ICD-10-CM | POA: Diagnosis not present

## 2020-01-16 DIAGNOSIS — N2581 Secondary hyperparathyroidism of renal origin: Secondary | ICD-10-CM | POA: Diagnosis not present

## 2020-01-16 DIAGNOSIS — D631 Anemia in chronic kidney disease: Secondary | ICD-10-CM | POA: Diagnosis not present

## 2020-01-16 DIAGNOSIS — N186 End stage renal disease: Secondary | ICD-10-CM | POA: Diagnosis not present

## 2020-01-16 DIAGNOSIS — Z23 Encounter for immunization: Secondary | ICD-10-CM | POA: Diagnosis not present

## 2020-01-18 DIAGNOSIS — D631 Anemia in chronic kidney disease: Secondary | ICD-10-CM | POA: Diagnosis not present

## 2020-01-18 DIAGNOSIS — Z992 Dependence on renal dialysis: Secondary | ICD-10-CM | POA: Diagnosis not present

## 2020-01-18 DIAGNOSIS — Z23 Encounter for immunization: Secondary | ICD-10-CM | POA: Diagnosis not present

## 2020-01-18 DIAGNOSIS — N186 End stage renal disease: Secondary | ICD-10-CM | POA: Diagnosis not present

## 2020-01-18 DIAGNOSIS — N2581 Secondary hyperparathyroidism of renal origin: Secondary | ICD-10-CM | POA: Diagnosis not present

## 2020-01-21 DIAGNOSIS — D631 Anemia in chronic kidney disease: Secondary | ICD-10-CM | POA: Diagnosis not present

## 2020-01-21 DIAGNOSIS — N186 End stage renal disease: Secondary | ICD-10-CM | POA: Diagnosis not present

## 2020-01-21 DIAGNOSIS — Z23 Encounter for immunization: Secondary | ICD-10-CM | POA: Diagnosis not present

## 2020-01-21 DIAGNOSIS — N2581 Secondary hyperparathyroidism of renal origin: Secondary | ICD-10-CM | POA: Diagnosis not present

## 2020-01-21 DIAGNOSIS — Z992 Dependence on renal dialysis: Secondary | ICD-10-CM | POA: Diagnosis not present

## 2020-01-23 DIAGNOSIS — Z992 Dependence on renal dialysis: Secondary | ICD-10-CM | POA: Diagnosis not present

## 2020-01-23 DIAGNOSIS — N2581 Secondary hyperparathyroidism of renal origin: Secondary | ICD-10-CM | POA: Diagnosis not present

## 2020-01-23 DIAGNOSIS — D631 Anemia in chronic kidney disease: Secondary | ICD-10-CM | POA: Diagnosis not present

## 2020-01-23 DIAGNOSIS — N186 End stage renal disease: Secondary | ICD-10-CM | POA: Diagnosis not present

## 2020-01-23 DIAGNOSIS — Z23 Encounter for immunization: Secondary | ICD-10-CM | POA: Diagnosis not present

## 2020-01-25 DIAGNOSIS — N186 End stage renal disease: Secondary | ICD-10-CM | POA: Diagnosis not present

## 2020-01-25 DIAGNOSIS — Z23 Encounter for immunization: Secondary | ICD-10-CM | POA: Diagnosis not present

## 2020-01-25 DIAGNOSIS — D631 Anemia in chronic kidney disease: Secondary | ICD-10-CM | POA: Diagnosis not present

## 2020-01-25 DIAGNOSIS — Z992 Dependence on renal dialysis: Secondary | ICD-10-CM | POA: Diagnosis not present

## 2020-01-25 DIAGNOSIS — N2581 Secondary hyperparathyroidism of renal origin: Secondary | ICD-10-CM | POA: Diagnosis not present

## 2020-01-28 DIAGNOSIS — D631 Anemia in chronic kidney disease: Secondary | ICD-10-CM | POA: Diagnosis not present

## 2020-01-28 DIAGNOSIS — N186 End stage renal disease: Secondary | ICD-10-CM | POA: Diagnosis not present

## 2020-01-28 DIAGNOSIS — Z23 Encounter for immunization: Secondary | ICD-10-CM | POA: Diagnosis not present

## 2020-01-28 DIAGNOSIS — N2581 Secondary hyperparathyroidism of renal origin: Secondary | ICD-10-CM | POA: Diagnosis not present

## 2020-01-28 DIAGNOSIS — Z992 Dependence on renal dialysis: Secondary | ICD-10-CM | POA: Diagnosis not present

## 2020-01-30 DIAGNOSIS — D631 Anemia in chronic kidney disease: Secondary | ICD-10-CM | POA: Diagnosis not present

## 2020-01-30 DIAGNOSIS — Z23 Encounter for immunization: Secondary | ICD-10-CM | POA: Diagnosis not present

## 2020-01-30 DIAGNOSIS — Z992 Dependence on renal dialysis: Secondary | ICD-10-CM | POA: Diagnosis not present

## 2020-01-30 DIAGNOSIS — N186 End stage renal disease: Secondary | ICD-10-CM | POA: Diagnosis not present

## 2020-01-30 DIAGNOSIS — N2581 Secondary hyperparathyroidism of renal origin: Secondary | ICD-10-CM | POA: Diagnosis not present

## 2020-02-01 DIAGNOSIS — N2581 Secondary hyperparathyroidism of renal origin: Secondary | ICD-10-CM | POA: Diagnosis not present

## 2020-02-01 DIAGNOSIS — Z23 Encounter for immunization: Secondary | ICD-10-CM | POA: Diagnosis not present

## 2020-02-01 DIAGNOSIS — Z992 Dependence on renal dialysis: Secondary | ICD-10-CM | POA: Diagnosis not present

## 2020-02-01 DIAGNOSIS — D631 Anemia in chronic kidney disease: Secondary | ICD-10-CM | POA: Diagnosis not present

## 2020-02-01 DIAGNOSIS — N186 End stage renal disease: Secondary | ICD-10-CM | POA: Diagnosis not present

## 2020-02-03 DIAGNOSIS — Z992 Dependence on renal dialysis: Secondary | ICD-10-CM | POA: Diagnosis not present

## 2020-02-03 DIAGNOSIS — N2581 Secondary hyperparathyroidism of renal origin: Secondary | ICD-10-CM | POA: Diagnosis not present

## 2020-02-03 DIAGNOSIS — Z23 Encounter for immunization: Secondary | ICD-10-CM | POA: Diagnosis not present

## 2020-02-03 DIAGNOSIS — D631 Anemia in chronic kidney disease: Secondary | ICD-10-CM | POA: Diagnosis not present

## 2020-02-03 DIAGNOSIS — N186 End stage renal disease: Secondary | ICD-10-CM | POA: Diagnosis not present

## 2020-02-05 DIAGNOSIS — D631 Anemia in chronic kidney disease: Secondary | ICD-10-CM | POA: Diagnosis not present

## 2020-02-05 DIAGNOSIS — N2581 Secondary hyperparathyroidism of renal origin: Secondary | ICD-10-CM | POA: Diagnosis not present

## 2020-02-05 DIAGNOSIS — N186 End stage renal disease: Secondary | ICD-10-CM | POA: Diagnosis not present

## 2020-02-05 DIAGNOSIS — Z992 Dependence on renal dialysis: Secondary | ICD-10-CM | POA: Diagnosis not present

## 2020-02-05 DIAGNOSIS — Z23 Encounter for immunization: Secondary | ICD-10-CM | POA: Diagnosis not present

## 2020-02-08 DIAGNOSIS — N2581 Secondary hyperparathyroidism of renal origin: Secondary | ICD-10-CM | POA: Diagnosis not present

## 2020-02-08 DIAGNOSIS — D631 Anemia in chronic kidney disease: Secondary | ICD-10-CM | POA: Diagnosis not present

## 2020-02-08 DIAGNOSIS — N186 End stage renal disease: Secondary | ICD-10-CM | POA: Diagnosis not present

## 2020-02-08 DIAGNOSIS — Z23 Encounter for immunization: Secondary | ICD-10-CM | POA: Diagnosis not present

## 2020-02-08 DIAGNOSIS — Z992 Dependence on renal dialysis: Secondary | ICD-10-CM | POA: Diagnosis not present

## 2020-02-11 DIAGNOSIS — N186 End stage renal disease: Secondary | ICD-10-CM | POA: Diagnosis not present

## 2020-02-11 DIAGNOSIS — D631 Anemia in chronic kidney disease: Secondary | ICD-10-CM | POA: Diagnosis not present

## 2020-02-11 DIAGNOSIS — N2581 Secondary hyperparathyroidism of renal origin: Secondary | ICD-10-CM | POA: Diagnosis not present

## 2020-02-11 DIAGNOSIS — Z23 Encounter for immunization: Secondary | ICD-10-CM | POA: Diagnosis not present

## 2020-02-11 DIAGNOSIS — Z992 Dependence on renal dialysis: Secondary | ICD-10-CM | POA: Diagnosis not present

## 2020-02-12 DIAGNOSIS — N186 End stage renal disease: Secondary | ICD-10-CM | POA: Diagnosis not present

## 2020-02-12 DIAGNOSIS — Z992 Dependence on renal dialysis: Secondary | ICD-10-CM | POA: Diagnosis not present

## 2020-02-12 DIAGNOSIS — I129 Hypertensive chronic kidney disease with stage 1 through stage 4 chronic kidney disease, or unspecified chronic kidney disease: Secondary | ICD-10-CM | POA: Diagnosis not present

## 2020-02-13 DIAGNOSIS — Z23 Encounter for immunization: Secondary | ICD-10-CM | POA: Diagnosis not present

## 2020-02-13 DIAGNOSIS — N2581 Secondary hyperparathyroidism of renal origin: Secondary | ICD-10-CM | POA: Diagnosis not present

## 2020-02-13 DIAGNOSIS — N186 End stage renal disease: Secondary | ICD-10-CM | POA: Diagnosis not present

## 2020-02-13 DIAGNOSIS — D631 Anemia in chronic kidney disease: Secondary | ICD-10-CM | POA: Diagnosis not present

## 2020-02-13 DIAGNOSIS — Z992 Dependence on renal dialysis: Secondary | ICD-10-CM | POA: Diagnosis not present

## 2020-02-15 DIAGNOSIS — Z23 Encounter for immunization: Secondary | ICD-10-CM | POA: Diagnosis not present

## 2020-02-15 DIAGNOSIS — N2581 Secondary hyperparathyroidism of renal origin: Secondary | ICD-10-CM | POA: Diagnosis not present

## 2020-02-15 DIAGNOSIS — Z992 Dependence on renal dialysis: Secondary | ICD-10-CM | POA: Diagnosis not present

## 2020-02-15 DIAGNOSIS — D631 Anemia in chronic kidney disease: Secondary | ICD-10-CM | POA: Diagnosis not present

## 2020-02-15 DIAGNOSIS — N186 End stage renal disease: Secondary | ICD-10-CM | POA: Diagnosis not present

## 2020-02-18 DIAGNOSIS — D631 Anemia in chronic kidney disease: Secondary | ICD-10-CM | POA: Diagnosis not present

## 2020-02-18 DIAGNOSIS — N186 End stage renal disease: Secondary | ICD-10-CM | POA: Diagnosis not present

## 2020-02-18 DIAGNOSIS — N2581 Secondary hyperparathyroidism of renal origin: Secondary | ICD-10-CM | POA: Diagnosis not present

## 2020-02-18 DIAGNOSIS — Z23 Encounter for immunization: Secondary | ICD-10-CM | POA: Diagnosis not present

## 2020-02-18 DIAGNOSIS — Z992 Dependence on renal dialysis: Secondary | ICD-10-CM | POA: Diagnosis not present

## 2020-02-20 DIAGNOSIS — N186 End stage renal disease: Secondary | ICD-10-CM | POA: Diagnosis not present

## 2020-02-20 DIAGNOSIS — Z992 Dependence on renal dialysis: Secondary | ICD-10-CM | POA: Diagnosis not present

## 2020-02-20 DIAGNOSIS — Z23 Encounter for immunization: Secondary | ICD-10-CM | POA: Diagnosis not present

## 2020-02-20 DIAGNOSIS — N2581 Secondary hyperparathyroidism of renal origin: Secondary | ICD-10-CM | POA: Diagnosis not present

## 2020-02-20 DIAGNOSIS — D631 Anemia in chronic kidney disease: Secondary | ICD-10-CM | POA: Diagnosis not present

## 2020-02-22 DIAGNOSIS — Z992 Dependence on renal dialysis: Secondary | ICD-10-CM | POA: Diagnosis not present

## 2020-02-22 DIAGNOSIS — Z23 Encounter for immunization: Secondary | ICD-10-CM | POA: Diagnosis not present

## 2020-02-22 DIAGNOSIS — N2581 Secondary hyperparathyroidism of renal origin: Secondary | ICD-10-CM | POA: Diagnosis not present

## 2020-02-22 DIAGNOSIS — N186 End stage renal disease: Secondary | ICD-10-CM | POA: Diagnosis not present

## 2020-02-22 DIAGNOSIS — D631 Anemia in chronic kidney disease: Secondary | ICD-10-CM | POA: Diagnosis not present

## 2020-02-24 ENCOUNTER — Emergency Department: Payer: Medicare Other

## 2020-02-24 ENCOUNTER — Inpatient Hospital Stay
Admission: EM | Admit: 2020-02-24 | Discharge: 2020-02-26 | DRG: 660 | Disposition: A | Discharge status: Home / Self Care | Payer: Medicare Other | Attending: Internal Medicine | Admitting: Internal Medicine

## 2020-02-24 ENCOUNTER — Other Ambulatory Visit: Payer: Self-pay

## 2020-02-24 DIAGNOSIS — M109 Gout, unspecified: Secondary | ICD-10-CM | POA: Diagnosis present

## 2020-02-24 DIAGNOSIS — Z79899 Other long term (current) drug therapy: Secondary | ICD-10-CM | POA: Diagnosis not present

## 2020-02-24 DIAGNOSIS — Z8701 Personal history of pneumonia (recurrent): Secondary | ICD-10-CM | POA: Diagnosis not present

## 2020-02-24 DIAGNOSIS — N390 Urinary tract infection, site not specified: Secondary | ICD-10-CM

## 2020-02-24 DIAGNOSIS — I132 Hypertensive heart and chronic kidney disease with heart failure and with stage 5 chronic kidney disease, or end stage renal disease: Secondary | ICD-10-CM | POA: Diagnosis not present

## 2020-02-24 DIAGNOSIS — I160 Hypertensive urgency: Secondary | ICD-10-CM | POA: Diagnosis present

## 2020-02-24 DIAGNOSIS — Z20822 Contact with and (suspected) exposure to covid-19: Secondary | ICD-10-CM | POA: Diagnosis present

## 2020-02-24 DIAGNOSIS — J449 Chronic obstructive pulmonary disease, unspecified: Secondary | ICD-10-CM | POA: Diagnosis present

## 2020-02-24 DIAGNOSIS — Z9114 Patient's other noncompliance with medication regimen: Secondary | ICD-10-CM

## 2020-02-24 DIAGNOSIS — D631 Anemia in chronic kidney disease: Secondary | ICD-10-CM | POA: Diagnosis present

## 2020-02-24 DIAGNOSIS — R8281 Pyuria: Secondary | ICD-10-CM | POA: Diagnosis not present

## 2020-02-24 DIAGNOSIS — Z992 Dependence on renal dialysis: Secondary | ICD-10-CM | POA: Diagnosis not present

## 2020-02-24 DIAGNOSIS — Z87442 Personal history of urinary calculi: Secondary | ICD-10-CM

## 2020-02-24 DIAGNOSIS — N401 Enlarged prostate with lower urinary tract symptoms: Secondary | ICD-10-CM | POA: Diagnosis present

## 2020-02-24 DIAGNOSIS — Z8249 Family history of ischemic heart disease and other diseases of the circulatory system: Secondary | ICD-10-CM | POA: Diagnosis not present

## 2020-02-24 DIAGNOSIS — N23 Unspecified renal colic: Secondary | ICD-10-CM | POA: Diagnosis not present

## 2020-02-24 DIAGNOSIS — N136 Pyonephrosis: Principal | ICD-10-CM | POA: Diagnosis present

## 2020-02-24 DIAGNOSIS — N186 End stage renal disease: Secondary | ICD-10-CM | POA: Diagnosis present

## 2020-02-24 DIAGNOSIS — I120 Hypertensive chronic kidney disease with stage 5 chronic kidney disease or end stage renal disease: Secondary | ICD-10-CM | POA: Diagnosis present

## 2020-02-24 DIAGNOSIS — I10 Essential (primary) hypertension: Secondary | ICD-10-CM | POA: Diagnosis present

## 2020-02-24 DIAGNOSIS — N3943 Post-void dribbling: Secondary | ICD-10-CM | POA: Diagnosis not present

## 2020-02-24 DIAGNOSIS — F17210 Nicotine dependence, cigarettes, uncomplicated: Secondary | ICD-10-CM | POA: Diagnosis present

## 2020-02-24 DIAGNOSIS — N132 Hydronephrosis with renal and ureteral calculous obstruction: Secondary | ICD-10-CM

## 2020-02-24 DIAGNOSIS — I509 Heart failure, unspecified: Secondary | ICD-10-CM | POA: Diagnosis not present

## 2020-02-24 DIAGNOSIS — I700 Atherosclerosis of aorta: Secondary | ICD-10-CM | POA: Diagnosis present

## 2020-02-24 DIAGNOSIS — N2581 Secondary hyperparathyroidism of renal origin: Secondary | ICD-10-CM | POA: Diagnosis present

## 2020-02-24 DIAGNOSIS — N201 Calculus of ureter: Secondary | ICD-10-CM | POA: Diagnosis not present

## 2020-02-24 DIAGNOSIS — N400 Enlarged prostate without lower urinary tract symptoms: Secondary | ICD-10-CM | POA: Diagnosis not present

## 2020-02-24 DIAGNOSIS — Z85528 Personal history of other malignant neoplasm of kidney: Secondary | ICD-10-CM

## 2020-02-24 DIAGNOSIS — N261 Atrophy of kidney (terminal): Secondary | ICD-10-CM | POA: Diagnosis not present

## 2020-02-24 DIAGNOSIS — R350 Frequency of micturition: Secondary | ICD-10-CM | POA: Diagnosis not present

## 2020-02-24 DIAGNOSIS — R319 Hematuria, unspecified: Secondary | ICD-10-CM | POA: Diagnosis not present

## 2020-02-24 HISTORY — DX: Dependence on renal dialysis: Z99.2

## 2020-02-24 LAB — CBC
HCT: 28.8 % — ABNORMAL LOW (ref 39.0–52.0)
Hemoglobin: 9.7 g/dL — ABNORMAL LOW (ref 13.0–17.0)
MCH: 32.1 pg (ref 26.0–34.0)
MCHC: 33.7 g/dL (ref 30.0–36.0)
MCV: 95.4 fL (ref 80.0–100.0)
Platelets: 133 10*3/uL — ABNORMAL LOW (ref 150–400)
RBC: 3.02 MIL/uL — ABNORMAL LOW (ref 4.22–5.81)
RDW: 13.5 % (ref 11.5–15.5)
WBC: 4.1 10*3/uL (ref 4.0–10.5)
nRBC: 0 % (ref 0.0–0.2)

## 2020-02-24 LAB — URINALYSIS, COMPLETE (UACMP) WITH MICROSCOPIC
RBC / HPF: >50 RBC/hpf — ABNORMAL HIGH (ref 0–5)
Specific Gravity, Urine: 1.017 (ref 1.005–1.030)
WBC, UA: >50 WBC/hpf — ABNORMAL HIGH (ref 0–5)

## 2020-02-24 LAB — RESP PANEL BY RT-PCR (RSV, FLU A&B, COVID)  RVPGX2
Influenza A by PCR: NEGATIVE
Influenza B by PCR: NEGATIVE
Resp Syncytial Virus by PCR: NEGATIVE
SARS Coronavirus 2 by RT PCR: NEGATIVE

## 2020-02-24 LAB — BASIC METABOLIC PANEL
Anion gap: 12 (ref 5–15)
BUN: 40 mg/dL — ABNORMAL HIGH (ref 8–23)
CO2: 32 mmol/L (ref 22–32)
Calcium: 9.2 mg/dL (ref 8.9–10.3)
Chloride: 98 mmol/L (ref 98–111)
Creatinine, Ser: 11.03 mg/dL — ABNORMAL HIGH (ref 0.61–1.24)
GFR, Estimated: 5 mL/min — ABNORMAL LOW (ref >60)
Glucose, Bld: 116 mg/dL — ABNORMAL HIGH (ref 70–99)
Potassium: 4 mmol/L (ref 3.5–5.1)
Sodium: 142 mmol/L (ref 135–145)

## 2020-02-24 LAB — CREATININE, SERUM
Creatinine, Ser: 11.94 mg/dL — ABNORMAL HIGH (ref 0.61–1.24)
GFR, Estimated: 4 mL/min — ABNORMAL LOW (ref >60)

## 2020-02-24 MED ORDER — DOCUSATE SODIUM 100 MG PO CAPS
100.0000 mg | ORAL_CAPSULE | Freq: Two times a day (BID) | ORAL | Status: DC
  Administered 2020-02-25 – 2020-02-26 (×2): 100 mg via ORAL
  Filled 2020-02-24 (×2): qty 1

## 2020-02-24 MED ORDER — HYDROMORPHONE HCL 1 MG/ML IJ SOLN
1.0000 mg | Freq: Once | INTRAMUSCULAR | Status: AC
  Administered 2020-02-25: 1 mg via INTRAVENOUS
  Filled 2020-02-24: qty 1

## 2020-02-24 MED ORDER — ACETAMINOPHEN 650 MG RE SUPP: 650.0000 mg | Freq: Four times a day (QID) | RECTAL | Status: DC | PRN

## 2020-02-24 MED ORDER — HYDROMORPHONE HCL 1 MG/ML IJ SOLN
0.5000 mg | Freq: Once | INTRAMUSCULAR | Status: AC
Start: 2020-02-24 — End: 2020-02-24
  Administered 2020-02-24: 16:00:00 0.5 mg via INTRAVENOUS
  Filled 2020-02-24: qty 1

## 2020-02-24 MED ORDER — ENOXAPARIN SODIUM 40 MG/0.4ML ~~LOC~~ SOLN: 40.0000 mg | SUBCUTANEOUS | Status: DC

## 2020-02-24 MED ORDER — SODIUM CHLORIDE 0.9 % IV SOLN
1.0000 g | INTRAVENOUS | Status: DC
  Administered 2020-02-25: 17:00:00 1 g via INTRAVENOUS
  Filled 2020-02-24: qty 1
  Filled 2020-02-24: qty 10

## 2020-02-24 MED ORDER — HEPARIN SODIUM (PORCINE) 5000 UNIT/ML IJ SOLN
5000.0000 [IU] | Freq: Three times a day (TID) | INTRAMUSCULAR | Status: DC
  Administered 2020-02-24 – 2020-02-26 (×4): 5000 [IU] via SUBCUTANEOUS
  Filled 2020-02-24 (×4): qty 1

## 2020-02-24 MED ORDER — ONDANSETRON HCL 4 MG PO TABS: 4.0000 mg | ORAL_TABLET | Freq: Four times a day (QID) | ORAL | Status: DC | PRN

## 2020-02-24 MED ORDER — CARVEDILOL 6.25 MG PO TABS: 25.0000 mg | ORAL_TABLET | Freq: Two times a day (BID) | ORAL | Status: DC

## 2020-02-24 MED ORDER — SODIUM CHLORIDE 0.9 % IV SOLN
1.0000 g | Freq: Once | INTRAVENOUS | Status: AC
  Administered 2020-02-24: 16:00:00 1 g via INTRAVENOUS
  Filled 2020-02-24: qty 10

## 2020-02-24 MED ORDER — ONDANSETRON HCL 4 MG/2ML IJ SOLN: 4.0000 mg | Freq: Four times a day (QID) | INTRAMUSCULAR | Status: DC | PRN

## 2020-02-24 MED ORDER — FERRIC CITRATE 1 GM 210 MG(FE) PO TABS
630.0000 mg | ORAL_TABLET | Freq: Three times a day (TID) | ORAL | Status: DC
  Administered 2020-02-25 – 2020-02-26 (×2): 630 mg via ORAL
  Filled 2020-02-24 (×9): qty 3

## 2020-02-24 MED ORDER — HYDRALAZINE HCL 20 MG/ML IJ SOLN
10.0000 mg | Freq: Four times a day (QID) | INTRAMUSCULAR | Status: DC | PRN
  Administered 2020-02-24: 18:00:00 10 mg via INTRAVENOUS
  Filled 2020-02-24: qty 1

## 2020-02-24 MED ORDER — ACETAMINOPHEN 325 MG PO TABS: 650.0000 mg | ORAL_TABLET | Freq: Four times a day (QID) | ORAL | Status: DC | PRN

## 2020-02-24 NOTE — H&P (Addendum)
History and Physical    Dwayne Reed ZOX:[id number] DOB: 1955/10/15 DOA: 02/24/2020  PCP: Elby Beck, FNP   Patient coming from: Home  I have personally briefly reviewed patient's old medical records in [place]  Chief Complaint: Pain while urination associated with blood  HPI: Dwayne Reed is a 64 y.o. male with medical history significant of end-stage renal disease on hemodialysis MWF , COPD, essential hypertension, BPH with LUTS history of renal cell cancer anemia of chronic disease, gout, hyperparathyroidism presented in the emergency department with increased frequency of urine associated with blood data started yesterday.  Patient reported he was in usual state of health until yesterday when he noticed pain while urination.  He also noticed small amount of blood while urination.  He denies any fever, chills, sore throat, cough, recent travel, sick contacts.  He does have history of kidney stones and thinking this could be kidney stone which is causing obstruction and suprapubic pain.  He denies any pain at rest but reports pain while urination.   ED Course: He was hemodynamically stable except elevated blood pressure. BP 197/100. Labs include: Sodium 142, potassium 4.0, chloride 98, bicarb 32, glucose 116, BUN 14, creatinine 11.03, calcium 9.2, anion gap 12, WBC 4.1, hemoglobin 9.7, hematocrit 28.8, platelet 133, UA shows many bacteria . CT renal : Moderate right hydroureteronephrosis is noted secondary to 7 mm distal right ureteral calculus. Nonobstructive left nephrolithiasis. Left renal atrophy. Stable moderate prostatic enlargement.Aortic atherosclerosis.  Review of Systems:  Review of Systems  Constitutional: Negative.   HENT: Negative.   Eyes: Negative.   Respiratory: Negative.   Cardiovascular: Negative.   Gastrointestinal: Negative.   Genitourinary: Positive for dysuria, flank pain, frequency, hematuria and urgency.  Skin: Negative.   Neurological:  Negative.   Endo/Heme/Allergies: Negative.   Psychiatric/Behavioral: Negative.     Past Medical History:  Diagnosis Date  . Anemia    low iron  . CHF (congestive heart failure) ([place])    pt denies  . COPD (chronic obstructive pulmonary disease) ([place])   . History of kidney stones   . Hypertension   . Kidney stones   . Pneumonia   . Renal disorder    ESRD on HD (M,W,F)    Past Surgical History:  Procedure Laterality Date  . EXTRACORPOREAL SHOCK WAVE LITHOTRIPSY    . LITHOTRIPSY     x 5  . PROSTATE BIOPSY       reports that he has been smoking. He has been smoking about 0.15 packs per day. He has never used smokeless tobacco. He reports current alcohol use. He reports that he does not use drugs.  No Known Allergies  Family History  Problem Relation Age of Onset  . Heart disease Mother   . Sarcoidosis Sister   . Cancer Maternal Grandmother        type unknown   .  Family history reviewed and not pertinent   Prior to Admission medications   Medication Sig Start Date End Date Taking? Authorizing Provider  AURYXIA 1 GM 210 MG(Fe) tablet Take 3 tablets (630 mg total) by mouth 3 (three) times daily. 03/19/19   Tonia Ghent, MD  B Complex-C-Zn-Folic Acid (DIALYVITE 811-[id number] 15) 0.8 MG TABS TAKE 1 TABLET BY MOUTH EVERY DAY WITH THE EVENING MEAL 09/13/18   [provider]  carvedilol (COREG) 25 MG tablet Take 1 tablet by mouth 2 (two) times daily. 06/21/17   [provider]  NONFORMULARY OR COMPOUNDED ITEM Antifungal solution: Terbinafine 3%,  Fluconazole 2%, Tea Tree Oil 5%, Urea 10%, Ibuprofen 2% in DMSO suspension #66mL 04/17/19   [name], DPM    Physical Exam: Vitals:   02/24/20 1223 02/24/20 1224 02/24/20 1525 02/24/20 1611  BP: (!) 181/87  (!) 192/98 (!) 197/100  Pulse: 79  71 70  Resp: 17  16 16   Temp: 98.4 F (36.9 C)     TempSrc: Oral     SpO2: 98%  98% 100%  Weight:  83.9 kg    Height:  6' (1.829 m)      Constitutional: NAD, calm,  comfortable Vitals:   02/24/20 1223 02/24/20 1224 02/24/20 1525 02/24/20 1611  BP: (!) 181/87  (!) 192/98 (!) 197/100  Pulse: 79  71 70  Resp: 17  16 16   Temp: 98.4 F (36.9 C)     TempSrc: Oral     SpO2: 98%  98% 100%  Weight:  83.9 kg    Height:  6' (1.829 m)     Eyes: PERRL, lids and conjunctivae normal ENMT: Mucous membranes are moist. Posterior pharynx clear of any exudate or lesions.Normal dentition.  Neck: normal, supple, no masses, no thyromegaly Respiratory: clear to auscultation bilaterally, no wheezing, no crackles. Normal respiratory effort. No accessory muscle use.  Cardiovascular: Regular rate and rhythm, no murmurs / rubs / gallops. No extremity edema. 2+ pedal pulses. No carotid bruits.  Abdomen:  suprapubic tenderness noted., no masses palpated. No hepatosplenomegaly. Bowel sounds positive.  Musculoskeletal: no clubbing / cyanosis. No joint deformity upper and lower extremities. Good ROM, no contractures. Normal muscle tone.  Skin: no rashes, lesions, ulcers. No induration Neurologic: CN 2-12 grossly intact. Sensation intact, DTR normal. Strength 5/5 in all 4.  Psychiatric: Normal judgment and insight. Alert and oriented x 3. Normal mood.     Labs on Admission: I have personally reviewed following labs and imaging studies  CBC: Recent Labs  Lab 02/24/20 1224  WBC 4.1  HGB 9.7*  HCT 28.8*  MCV 95.4  PLT 637*   Basic Metabolic Panel: Recent Labs  Lab 02/24/20 1224  NA 142  K 4.0  CL 98  CO2 32  GLUCOSE 116*  BUN 40*  CREATININE 11.03*  CALCIUM 9.2   GFR: Estimated Creatinine Clearance: 7.3 mL/min (A) (by C-G formula based on SCr of 11.03 mg/dL (H)). Liver Function Tests: No results for input(s): AST, ALT, ALKPHOS, BILITOT, PROT, ALBUMIN in the last 168 hours. No results for input(s): LIPASE, AMYLASE in the last 168 hours. No results for input(s): AMMONIA in the last 168 hours. Coagulation Profile: No results for input(s): INR, PROTIME in the  last 168 hours. Cardiac Enzymes: No results for input(s): CKTOTAL, CKMB, CKMBINDEX, TROPONINI in the last 168 hours. BNP (last 3 results) No results for input(s): PROBNP in the last 8760 hours. HbA1C: No results for input(s): HGBA1C in the last 72 hours. CBG: No results for input(s): GLUCAP in the last 168 hours. Lipid Profile: No results for input(s): CHOL, HDL, LDLCALC, TRIG, CHOLHDL, LDLDIRECT in the last 72 hours. Thyroid Function Tests: No results for input(s): TSH, T4TOTAL, FREET4, T3FREE, THYROIDAB in the last 72 hours. Anemia Panel: No results for input(s): VITAMINB12, FOLATE, FERRITIN, TIBC, IRON, RETICCTPCT in the last 72 hours. Urine analysis:    Component Value Date/Time   COLORURINE BROWN (A) 02/24/2020 1226   APPEARANCEUR TURBID (A) 02/24/2020 1226   LABSPEC 1.017 02/24/2020 1226   PHURINE  02/24/2020 1226    TEST NOT REPORTED DUE TO COLOR INTERFERENCE OF URINE  PIGMENT   GLUCOSEU (A) 02/24/2020 1226    TEST NOT REPORTED DUE TO COLOR INTERFERENCE OF URINE PIGMENT   HGBUR (A) 02/24/2020 1226    TEST NOT REPORTED DUE TO COLOR INTERFERENCE OF URINE PIGMENT   BILIRUBINUR (A) 02/24/2020 1226    TEST NOT REPORTED DUE TO COLOR INTERFERENCE OF URINE PIGMENT   KETONESUR (A) 02/24/2020 1226    TEST NOT REPORTED DUE TO COLOR INTERFERENCE OF URINE PIGMENT   PROTEINUR (A) 02/24/2020 1226    TEST NOT REPORTED DUE TO COLOR INTERFERENCE OF URINE PIGMENT   NITRITE (A) 02/24/2020 1226    TEST NOT REPORTED DUE TO COLOR INTERFERENCE OF URINE PIGMENT   LEUKOCYTESUR (A) 02/24/2020 1226    TEST NOT REPORTED DUE TO COLOR INTERFERENCE OF URINE PIGMENT    Radiological Exams on Admission: CT Renal Stone Study  Result Date: 02/24/2020 CLINICAL DATA:  Hematuria. EXAM: CT ABDOMEN AND PELVIS WITHOUT CONTRAST TECHNIQUE: Multidetector CT imaging of the abdomen and pelvis was performed following the standard protocol without IV contrast. COMPARISON:  October 17, 2017. FINDINGS: Lower chest: No  acute abnormality. Hepatobiliary: No focal liver abnormality is seen. No gallstones, gallbladder wall thickening, or biliary dilatation. Pancreas: Unremarkable. No pancreatic ductal dilatation or surrounding inflammatory changes. Spleen: Normal in size without focal abnormality. Adrenals/Urinary Tract: Adrenal glands appear normal. Left renal atrophy is noted. Nonobstructive left nephrolithiasis is noted. Several left renal cysts are noted. Moderate right hydroureteronephrosis is noted secondary to 7 mm calculus in distal right ureter. Urinary bladder is unremarkable. Stomach/Bowel: Stomach is within normal limits. Appendix appears normal. No evidence of bowel wall thickening, distention, or inflammatory changes. Vascular/Lymphatic: Aortic atherosclerosis. No enlarged abdominal or pelvic lymph nodes. Reproductive: Stable moderate prostatic enlargement is noted. Other: No abdominal wall hernia or abnormality. No abdominopelvic ascites. Musculoskeletal: No acute or significant osseous findings. IMPRESSION: 1. Moderate right hydroureteronephrosis is noted secondary to 7 mm distal right ureteral calculus. 2. Nonobstructive left nephrolithiasis. 3. Left renal atrophy. 4. Stable moderate prostatic enlargement. 5. Aortic atherosclerosis. Aortic Atherosclerosis (ICD10-I70.0). Electronically Signed   By: Marijo Conception M.D.   On: 02/24/2020 14:42    EKG:  Ordered/ Not completed.  Assessment/Plan Principal Problem:   Hydronephrosis with renal and ureteral calculus obstruction Active Problems:   COPD (chronic obstructive pulmonary disease) (H[facility])   Essential hypertension   ESRD on dialysis ([facility])   Benign prostatic hyperplasia with lower urinary tract symptoms   History of renal cell cancer   Anemia of renal disease    Right flank pain associated with hematuria secondary to kidney stone. Patient reports having suprapubic pain, frequency, dysuria associated with hematuria. CT renal consistent with  Moderate  right hydroureteronephrosis, secondary to 7 mm distal right ureteral calculus. UA is consistent with UTI. Start ceftriaxone 1 g  Daily. Follow-up urine and blood cultures Urology consulted, recommended no acute intervention,  no need for stenting at this time. Spoke with Dr. Bernardo Heater, he recommended to keep patient n.p.o. midnight Patient might need a stent in the morning. Advised IV fluids, pain medication and IV antibiotics.  End-stage renal disease: Continue hemodialysis Monday Wednesday and Friday Please consult Dr. Zollie Scale in the morning for continuation of hemodialysis.   Hypertension: Patient is not on any antihypertensive medication. Hydralazine as needed.   COPD: Not in acute exacerbation. Continue inhalers.  Anemia of chronic disease Hemoglobin is stable.   DVT prophylaxis:heparin sq Code Status: Full code. Family Communication: No one at bed side. Disposition Plan: Home Consults called: Urology and Nephrology  Admission status: Inpatient   Shawna Clamp MD [facility]   If 7PM-7AM, please contact night-coverage [email or web address]   02/24/2020, 4:44 PM

## 2020-02-24 NOTE — ED Notes (Signed)
ED TO INPATIENT HANDOFF REPORT  ED Nurse Name and Phone #: [phone]  S Name/Age/Gender Dwayne Reed 64 y.o. male Room/Bed: ED33A/ED33A  Code Status   Code Status: Full Code  Home/SNF/Other Home Patient oriented to: self, place, time and situation Is this baseline? Yes   Triage Complete: Triage complete  Chief Complaint Hydronephrosis concurrent with and due to calculi of kidney and ureter [N13.2]  Triage Note Pt c/o increased urinary frequency with blood in urine since yesterday, states he has a hx of kidney stones but not sure if this is the same. Pt is also on dialysis, tuesday, Thursday Saturday.    Allergies No Known Allergies  Level of Care/Admitting Diagnosis ED Disposition    ED Disposition Condition [facility] Area: [place] [[id number]]  Level of Care: Med-Surg [16]  Covid Evaluation: Asymptomatic Screening Protocol (No Symptoms)  Diagnosis: Hydronephrosis concurrent with and due to calculi of kidney and ureter [[id number]]  Admitting Physician: Tarry Kos  Attending Physician: Tarry Kos  Estimated length of stay: 3 - 4 days  Certification:: I certify this patient will need inpatient services for at least 2 midnights       B Medical/Surgery History Past Medical History:  Diagnosis Date  . Anemia    low iron  . CHF (congestive heart failure) ([place])    pt denies  . COPD (chronic obstructive pulmonary disease) ([place])   . History of kidney stones   . Hypertension   . Kidney stones   . Pneumonia   . 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 EXTRACORPOREAL SHOCK WAVE LITHOTRIPSY    . LITHOTRIPSY     x 5  . PROSTATE BIOPSY       A IV Location/Drains/Wounds Patient Lines/Drains/Airways Status    Active Line/Drains/Airways    Name Placement date Placement time Site Days   Peripheral IV 02/24/20 Left Antecubital 02/24/20  1605  Antecubital   less than 1   Fistula / Graft Right Forearm Arteriovenous fistula --  --  Forearm  --          Intake/Output Last 24 hours  Intake/Output Summary (Last 24 hours) at 02/24/2020 2242 Last data filed at 02/24/2020 1739 Gross per 24 hour  Intake 100 ml  Output --  Net 100 ml    Labs/Imaging Results for orders placed or performed during the hospital encounter of 02/24/20 (from the past 48 hour(s))  Basic metabolic panel     Status: Abnormal   Collection Time: 02/24/20 12:24 PM  Result Value Ref Range   Sodium 142 135 - 145 mmol/L   Potassium 4.0 3.5 - 5.1 mmol/L   Chloride 98 98 - 111 mmol/L   CO2 32 22 - 32 mmol/L   Glucose, Bld 116 (H) 70 - 99 mg/dL    Comment: Glucose reference range applies only to samples taken after fasting for at least 8 hours.   BUN 40 (H) 8 - 23 mg/dL   Creatinine, Ser 11.03 (H) 0.61 - 1.24 mg/dL   Calcium 9.2 8.9 - 10.3 mg/dL   GFR, Estimated 5 (L) >60 mL/min    Comment: (NOTE) Calculated using the CKD-EPI Creatinine Equation (2021)    Anion gap 12 5 - 15    Comment: Performed at [facility], [address] [place], [place] [address]  CBC     Status: Abnormal   Collection Time: 02/24/20  12:24 PM  Result Value Ref Range   WBC 4.1 4.0 - 10.5 K/uL   RBC 3.02 (L) 4.22 - 5.81 MIL/uL   Hemoglobin 9.7 (L) 13.0 - 17.0 g/dL   HCT 28.8 (L) 39.0 - 52.0 %   MCV 95.4 80.0 - 100.0 fL   MCH 32.1 26.0 - 34.0 pg   MCHC 33.7 30.0 - 36.0 g/dL   RDW 13.5 11.5 - 15.5 %   Platelets 133 (L) 150 - 400 K/uL   nRBC 0.0 0.0 - 0.2 %    Comment: Performed at [facility], [address] [place], [place] [address]  Urinalysis, Complete w Microscopic Urine, Clean Catch     Status: Abnormal   Collection Time: 02/24/20 12:26 PM  Result Value Ref Range   Color, Urine BROWN (A) YELLOW   APPearance TURBID (A) CLEAR   Specific Gravity, Urine 1.017 1.005 - 1.030   pH  5.0 - 8.0    TEST NOT REPORTED DUE TO COLOR INTERFERENCE OF URINE PIGMENT    Glucose, UA (A) NEGATIVE mg/dL    TEST NOT REPORTED DUE TO COLOR INTERFERENCE OF URINE PIGMENT   Hgb urine dipstick (A) NEGATIVE    TEST NOT REPORTED DUE TO COLOR INTERFERENCE OF URINE PIGMENT   Bilirubin Urine (A) NEGATIVE    TEST NOT REPORTED DUE TO COLOR INTERFERENCE OF URINE PIGMENT   Ketones, ur (A) NEGATIVE mg/dL    TEST NOT REPORTED DUE TO COLOR INTERFERENCE OF URINE PIGMENT   Protein, ur (A) NEGATIVE mg/dL    TEST NOT REPORTED DUE TO COLOR INTERFERENCE OF URINE PIGMENT   Nitrite (A) NEGATIVE    TEST NOT REPORTED DUE TO COLOR INTERFERENCE OF URINE PIGMENT   Leukocytes,Ua (A) NEGATIVE    TEST NOT REPORTED DUE TO COLOR INTERFERENCE OF URINE PIGMENT   RBC / HPF >50 (H) 0 - 5 RBC/hpf   WBC, UA >50 (H) 0 - 5 WBC/hpf   Bacteria, UA MANY (A) NONE SEEN   Squamous Epithelial / LPF 0-5 0 - 5   Non Squamous Epithelial PRESENT (A) NONE SEEN    Comment: Performed at [facility], [address], [place], [place] [address]  Resp panel by RT-PCR (RSV, Flu A&B, Covid) Nasopharyngeal Swab     Status: None   Collection Time: 02/24/20  8:01 PM   Specimen: Nasopharyngeal Swab; Nasopharyngeal(NP) swabs in vial transport medium  Result Value Ref Range   SARS Coronavirus 2 by RT PCR NEGATIVE NEGATIVE    Comment: (NOTE) SARS-CoV-2 target nucleic acids are NOT DETECTED.  The SARS-CoV-2 RNA is generally detectable in upper respiratory specimens during the acute phase of infection. The lowest concentration of SARS-CoV-2 viral copies this assay can detect is 138 copies/mL. A negative result does not preclude SARS-Cov-2 infection and should not be used as the sole basis for treatment or other patient management decisions. A negative result may occur with  improper specimen collection/handling, submission of specimen other than nasopharyngeal swab, presence of viral mutation(s) within the areas targeted by this assay, and inadequate number of viral copies(<138 copies/mL). A negative result  must be combined with clinical observations, patient history, and epidemiological information. The expected result is Negative.  Fact Sheet for Patients:  EntrepreneurPulse.com.au  Fact Sheet for Healthcare Providers:  IncredibleEmployment.be  This test is no t yet approved or cleared by the [place] FDA and  has been authorized for detection and/or diagnosis of SARS-CoV-2 by FDA under an Emergency Use Authorization (EUA). This EUA will  remain  in effect (meaning this test can be used) for the duration of the COVID-19 declaration under Section 564(b)(1) of the Act, 21 U.S.C.section 360bbb-3(b)(1), unless the authorization is terminated  or revoked sooner.       Influenza A by PCR NEGATIVE NEGATIVE   Influenza B by PCR NEGATIVE NEGATIVE    Comment: (NOTE) The Xpert Xpress SARS-CoV-2/FLU/RSV plus assay is intended as an aid in the diagnosis of influenza from Nasopharyngeal swab specimens and should not be used as a sole basis for treatment. Nasal washings and aspirates are unacceptable for Xpert Xpress SARS-CoV-2/FLU/RSV testing.  Fact Sheet for Patients: EntrepreneurPulse.com.au  Fact Sheet for Healthcare Providers: IncredibleEmployment.be  This test is not yet approved or cleared by the [place] FDA and has been authorized for detection and/or diagnosis of SARS-CoV-2 by FDA under an Emergency Use Authorization (EUA). This EUA will remain in effect (meaning this test can be used) for the duration of the COVID-19 declaration under Section 564(b)(1) of the Act, 21 U.S.C. section 360bbb-3(b)(1), unless the authorization is terminated or revoked.     Resp Syncytial Virus by PCR NEGATIVE NEGATIVE    Comment: (NOTE) Fact Sheet for Patients: EntrepreneurPulse.com.au  Fact Sheet for Healthcare Providers: IncredibleEmployment.be  This test is not yet approved or  cleared by the [place] FDA and has been authorized for detection and/or diagnosis of SARS-CoV-2 by FDA under an Emergency Use Authorization (EUA). This EUA will remain in effect (meaning this test can be used) for the duration of the COVID-19 declaration under Section 564(b)(1) of the Act, 21 U.S.C. section 360bbb-3(b)(1), unless the authorization is terminated or revoked.  Performed at [facility], [place] [address], [place], [place] [address]    CT Renal Stone Study  Result Date: 02/24/2020 CLINICAL DATA:  Hematuria. EXAM: CT ABDOMEN AND PELVIS WITHOUT CONTRAST TECHNIQUE: Multidetector CT imaging of the abdomen and pelvis was performed following the standard protocol without IV contrast. COMPARISON:  October 17, 2017. FINDINGS: Lower chest: No acute abnormality. Hepatobiliary: No focal liver abnormality is seen. No gallstones, gallbladder wall thickening, or biliary dilatation. Pancreas: Unremarkable. No pancreatic ductal dilatation or surrounding inflammatory changes. Spleen: Normal in size without focal abnormality. Adrenals/Urinary Tract: Adrenal glands appear normal. Left renal atrophy is noted. Nonobstructive left nephrolithiasis is noted. Several left renal cysts are noted. Moderate right hydroureteronephrosis is noted secondary to 7 mm calculus in distal right ureter. Urinary bladder is unremarkable. Stomach/Bowel: Stomach is within normal limits. Appendix appears normal. No evidence of bowel wall thickening, distention, or inflammatory changes. Vascular/Lymphatic: Aortic atherosclerosis. No enlarged abdominal or pelvic lymph nodes. Reproductive: Stable moderate prostatic enlargement is noted. Other: No abdominal wall hernia or abnormality. No abdominopelvic ascites. Musculoskeletal: No acute or significant osseous findings. IMPRESSION: 1. Moderate right hydroureteronephrosis is noted secondary to 7 mm distal right ureteral calculus. 2. Nonobstructive left nephrolithiasis. 3. Left  renal atrophy. 4. Stable moderate prostatic enlargement. 5. Aortic atherosclerosis. Aortic Atherosclerosis (ICD10-I70.0). Electronically Signed   By: Marijo Conception M.D.   On: 02/24/2020 14:42    Pending Labs Unresulted Labs (From admission, onward)          Start     Ordered   03/02/20 0500  Creatinine, serum  (enoxaparin (LOVENOX)    CrCl >/= 30 ml/min)  Weekly,   STAT     Comments: while on enoxaparin therapy    02/24/20 1642   02/25/20 0500  Comprehensive metabolic panel  Tomorrow morning,   STAT  02/24/20 1642   02/25/20 0500  CBC  Tomorrow morning,   STAT        02/24/20 1642   02/24/20 1639  HIV Antibody (routine testing w rflx)  (HIV Antibody (Routine testing w reflex) panel)  Once,   STAT        02/24/20 1642   02/24/20 1639  CBC  (enoxaparin (LOVENOX)    CrCl >/= 30 ml/min)  Once,   STAT       Comments: Baseline for enoxaparin therapy IF NOT ALREADY DRAWN.  Notify MD if PLT < 100 K.    02/24/20 1642   02/24/20 1639  Creatinine, serum  (enoxaparin (LOVENOX)    CrCl >/= 30 ml/min)  Once,   STAT       Comments: Baseline for enoxaparin therapy IF NOT ALREADY DRAWN.    02/24/20 1642   02/24/20 1601  Urine culture  Add-on,   AD        02/24/20 1601          Vitals/Pain Today's Vitals   02/24/20 1806 02/24/20 1836 02/24/20 1957 02/24/20 2241  BP:   (!) 190/93 (!) 192/94  Pulse: 72   76  Resp:    16  Temp:    98.6 F (37 C)  TempSrc:    Oral  SpO2: 99%   98%  Weight:      Height:      PainSc:  0-No pain      Isolation Precautions Airborne precautions  Medications Medications  ferric citrate (AURYXIA) tablet 630 mg (630 mg Oral Not Given 02/24/20 1830)  acetaminophen (TYLENOL) tablet 650 mg (has no administration in time range)    Or  acetaminophen (TYLENOL) suppository 650 mg (has no administration in time range)  docusate sodium (COLACE) capsule 100 mg (has no administration in time range)  ondansetron (ZOFRAN) tablet 4 mg (has no administration in  time range)    Or  ondansetron (ZOFRAN) injection 4 mg (has no administration in time range)  hydrALAZINE (APRESOLINE) injection 10 mg (10 mg Intravenous Given 02/24/20 1751)  cefTRIAXone (ROCEPHIN) 1 g in sodium chloride 0.9 % 100 mL IVPB (1 g Intravenous Not Given 02/24/20 1811)  heparin injection 5,000 Units (5,000 Units Subcutaneous Given 02/24/20 1830)  cefTRIAXone (ROCEPHIN) 1 g in sodium chloride 0.9 % 100 mL IVPB (0 g Intravenous Stopped 02/24/20 1739)  HYDROmorphone (DILAUDID) injection 0.5 mg (0.5 mg Intravenous Given 02/24/20 1623)    Mobility walks Low fall risk   Focused Assessments Renal Assessment Handoff:  Hemodialysis Schedule: Hemodialysis Schedule: Tuesday/Thursday/Saturday Last Hemodialysis date and time: 02-22-20   Restricted appendage: right arm     R Recommendations: See Admitting Provider Note  Report given to:   Additional Notes:

## 2020-02-24 NOTE — Progress Notes (Signed)
[facility]  ROUNDING NOTE   Subjective:   [name] was admitted to [facility] on 02/24/2020 for Hydronephrosis concurrent with and due to calculi of kidney and ureter [N13.2]  Last hemodialysis treatment was Saturday 02/22/2020. Patient left 1kg below his estimated dry weight.   Wife at bedside.    Objective:  Vital signs in last 24 hours:  Temp:  [98.4 F (36.9 C)] 98.4 F (36.9 C) (12/13 1223) Pulse Rate:  [69-79] 73 (12/13 1800) Resp:  [16-17] 16 (12/13 1611) BP: (181-203)/(87-100) 184/92 (12/13 1751) SpO2:  [96 %-100 %] 99 % (12/13 1800) Weight:  [83.9 kg] 83.9 kg (12/13 1224)  Weight change:  Filed Weights   02/24/20 1224  Weight: 83.9 kg    Intake/Output: I/O last 3 completed shifts: In: 100 [IV Piggyback:100] Out: -    Intake/Output this shift:  No intake/output data recorded.  Physical Exam: General: NAD, sitting up in bed  Head: Normocephalic, atraumatic. Moist oral mucosal membranes  Eyes: Anicteric, PERRL  Neck: Supple, trachea midline  Lungs:  Clear to auscultation  Heart: Regular rate and rhythm  Abdomen:  Soft, nontender,   Extremities:  no peripheral edema.  Neurologic: Nonfocal, moving all four extremities  Skin: No lesions  Access: Left AVF    Basic Metabolic Panel: Recent Labs  Lab 02/24/20 1224  NA 142  K 4.0  CL 98  CO2 32  GLUCOSE 116*  BUN 40*  CREATININE 11.03*  CALCIUM 9.2    Liver Function Tests: No results for input(s): AST, ALT, ALKPHOS, BILITOT, PROT, ALBUMIN in the last 168 hours. No results for input(s): LIPASE, AMYLASE in the last 168 hours. No results for input(s): AMMONIA in the last 168 hours.  CBC: Recent Labs  Lab 02/24/20 1224  WBC 4.1  HGB 9.7*  HCT 28.8*  MCV 95.4  PLT 133*    Cardiac Enzymes: No results for input(s): CKTOTAL, CKMB, CKMBINDEX, TROPONINI in the last 168 hours.  BNP: Invalid input(s): POCBNP  CBG: No results for input(s): GLUCAP in the last 168  hours.  Microbiology: No results found for this or any previous visit.  Coagulation Studies: No results for input(s): LABPROT, INR in the last 72 hours.  Urinalysis: Recent Labs    02/24/20 1226  COLORURINE BROWN*  LABSPEC 1.017  PHURINE TEST NOT REPORTED DUE TO COLOR INTERFERENCE OF URINE PIGMENT  GLUCOSEU TEST NOT REPORTED DUE TO COLOR INTERFERENCE OF URINE PIGMENT*  HGBUR TEST NOT REPORTED DUE TO COLOR INTERFERENCE OF URINE PIGMENT*  BILIRUBINUR TEST NOT REPORTED DUE TO COLOR INTERFERENCE OF URINE PIGMENT*  KETONESUR TEST NOT REPORTED DUE TO COLOR INTERFERENCE OF URINE PIGMENT*  PROTEINUR TEST NOT REPORTED DUE TO COLOR INTERFERENCE OF URINE PIGMENT*  NITRITE TEST NOT REPORTED DUE TO COLOR INTERFERENCE OF URINE PIGMENT*  LEUKOCYTESUR TEST NOT REPORTED DUE TO COLOR INTERFERENCE OF URINE PIGMENT*      Imaging: CT Renal Stone Study  Result Date: 02/24/2020 CLINICAL DATA:  Hematuria. EXAM: CT ABDOMEN AND PELVIS WITHOUT CONTRAST TECHNIQUE: Multidetector CT imaging of the abdomen and pelvis was performed following the standard protocol without IV contrast. COMPARISON:  October 17, 2017. FINDINGS: Lower chest: No acute abnormality. Hepatobiliary: No focal liver abnormality is seen. No gallstones, gallbladder wall thickening, or biliary dilatation. Pancreas: Unremarkable. No pancreatic ductal dilatation or surrounding inflammatory changes. Spleen: Normal in size without focal abnormality. Adrenals/Urinary Tract: Adrenal glands appear normal. Left renal atrophy is noted. Nonobstructive left nephrolithiasis is noted. Several left renal cysts are noted. Moderate right  hydroureteronephrosis is noted secondary to 7 mm calculus in distal right ureter. Urinary bladder is unremarkable. Stomach/Bowel: Stomach is within normal limits. Appendix appears normal. No evidence of bowel wall thickening, distention, or inflammatory changes. Vascular/Lymphatic: Aortic atherosclerosis. No enlarged abdominal or  pelvic lymph nodes. Reproductive: Stable moderate prostatic enlargement is noted. Other: No abdominal wall hernia or abnormality. No abdominopelvic ascites. Musculoskeletal: No acute or significant osseous findings. IMPRESSION: 1. Moderate right hydroureteronephrosis is noted secondary to 7 mm distal right ureteral calculus. 2. Nonobstructive left nephrolithiasis. 3. Left renal atrophy. 4. Stable moderate prostatic enlargement. 5. Aortic atherosclerosis. Aortic Atherosclerosis (ICD10-I70.0). Electronically Signed   By: Marijo Conception M.D.   On: 02/24/2020 14:42     Medications:   . cefTRIAXone (ROCEPHIN)  IV     . docusate sodium  100 mg Oral BID  . ferric citrate  630 mg Oral TID  . heparin injection (subcutaneous)  5,000 Units Subcutaneous Q8H   acetaminophen **OR** acetaminophen, hydrALAZINE, ondansetron **OR** ondansetron (ZOFRAN) IV  Assessment/ Plan:  Dwayne Reed is a 64 y.o. black male with end stage renal disease on hemodialysis, nephrolithiasis, hypertension, congestive heart failure, COPD, BPH, renal cell carcinoma status post left renal cryoablation. Ad[facility] to [facility] on 02/24/2020 for Hydronephrosis concurrent with and due to calculi of kidney and ureter [N13.2]   [facility]) TTS [facility]. Left AVF 83kg   1. End Stage Renal Disease: schedule hemodialysis tomorrow after his urologic procedure.   2. Hypertension: no longer on hypertensive agents. However blood pressure readings are elevated on outpatient dialysis flowsheets.   3. Anemia of chronic kidney disease: hemoglobin 9.7. Mircera as outpatient.   4. Secondary Hyperparathyroidism: 02/13/20 labs show PTH 525, phos and calcium at goal.  - etelcalcetide as outpatient.  - Ferric citrate with meals.    LOS: 0 [name] 12/13/20218:02 PM

## 2020-02-24 NOTE — ED Triage Notes (Signed)
Pt c/o increased urinary frequency with blood in urine since yesterday, states he has a hx of kidney stones but not sure if this is the same. Pt is also on dialysis, tuesday, Thursday Saturday.

## 2020-02-24 NOTE — ED Notes (Signed)
Pt provided with meal tray and ginger ale/cranberry juice per request

## 2020-02-24 NOTE — ED Provider Notes (Signed)
[facility] Emergency Department Provider Note   ____________________________________________   Event Date/Time   First MD Initiated Contact with Patient 02/24/20 1540     (approximate)  I have reviewed the triage vital signs and the nursing notes.   HISTORY  Chief Complaint Urinary Frequency and Hematuria  HPI Dwayne Reed is a 64 y.o. male who gets dialysis.  His last dialysis was on Saturday and his next one is tomorrow.  He has had increased urinary frequency with some blood in his urine since yesterday.  He has a history of kidney stones and is wondering if this could possibly be kidney stones.  He is not having fever or vomiting.  He has pain but only when he tries to urinate.  His pain is bad but not severe.  Pain is down in the groin.      Past Medical History:  Diagnosis Date  . Anemia    low iron  . CHF (congestive heart failure) ([place])    pt denies  . COPD (chronic obstructive pulmonary disease) ([place])   . History of kidney stones   . Hypertension   . Kidney stones   . Pneumonia   . Renal disorder    ESRD on HD (M,W,F)    Patient Active Problem List   Diagnosis Date Noted  . Breakdown of surgically created AV fistula, init ([place]) 10/23/2017  . Decreased energy 10/23/2017  . ESRD on dialysis ([place]) 07/26/2017  . SOB (shortness of breath) 07/25/2017  . 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 COPD (chronic obstructive pulmonary disease) ([place]) 07/14/2016  . History of renal cell cancer 07/12/2016  . Olecranon bursitis 05/27/2016  . Malignant tumor of prostate ([place]) 09/18/2015  . Benign prostatic hyperplasia with lower urinary tract symptoms 08/05/2015  . Pre-transplant evaluation for kidney transplant 08/05/2015  . 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 ESRD on hemodialysis ([place]) 02/04/2015  . Hyperparathyroidism ([place]) 02/04/2015  . Hyperphosphatemia 02/04/2015  . Anemia of renal disease 02/04/2015  . History of  transfusion 12/13/2014  . Elevated prostate specific antigen (PSA) 08/18/2012  . Essential hypertension 08/18/2012  . Gout 08/18/2012  . Acquired cyst of kidney 04/13/2012    Past Surgical History:  Procedure Laterality Date  . EXTRACORPOREAL SHOCK WAVE LITHOTRIPSY    . LITHOTRIPSY     x 5  . PROSTATE BIOPSY      Prior to Admission medications   Medication Sig Start Date End Date Taking? Authorizing Provider  AURYXIA 1 GM 210 MG(Fe) tablet Take 3 tablets (630 mg total) by mouth 3 (three) times daily. 03/19/19   Tonia Ghent, MD  B Complex-C-Zn-Folic Acid (DIALYVITE 510-CHEN 15) 0.8 MG TABS TAKE 1 TABLET BY MOUTH EVERY DAY WITH THE EVENING MEAL 09/13/18   [provider]  carvedilol (COREG) 25 MG tablet Take 1 tablet by mouth 2 (two) times daily. 06/21/17   [provider]  NONFORMULARY OR COMPOUNDED ITEM Antifungal solution: Terbinafine 3%, Fluconazole 2%, Tea Tree Oil 5%, Urea 10%, Ibuprofen 2% in DMSO suspension #20mL 04/17/19   [name], DPM    Allergies Patient has no known allergies.  Family History  Problem Relation Age of Onset  . Heart disease Mother   . Sarcoidosis Sister   . Cancer Maternal Grandmother        type unknown    Social History Social History   Tobacco Use  . Smoking status: Current Every Day Smoker    Packs/day: 0.15  . Smokeless tobacco:  Never Used  Vaping Use  . Vaping Use: Never used  Substance Use Topics  . Alcohol use: Yes    Comment: on holidays  . Drug use: Never    Review of Systems  Constitutional: No fever/chills Eyes: No visual changes. ENT: No sore throat. Cardiovascular: Denies chest pain. Respiratory: Denies shortness of breath. Gastrointestinal: No abdominal pain.  No nausea, no vomiting.  No diarrhea.  No constipation. Genitourinary: Negative for dysuria. Musculoskeletal: Negative for back pain. Skin: Negative for rash. Neurological: Negative for headaches, focal weakness    ____________________________________________   PHYSICAL EXAM:  VITAL SIGNS: ED Triage Vitals  Enc Vitals Group     BP 02/24/20 1223 (!) 181/87     Pulse Rate 02/24/20 1223 79     Resp 02/24/20 1223 17     Temp 02/24/20 1223 98.4 F (36.9 C)     Temp Source 02/24/20 1223 Oral     SpO2 02/24/20 1223 98 %     Weight 02/24/20 1224 185 lb (83.9 kg)     Height 02/24/20 1224 6' (1.829 m)     Head Circumference --      Peak Flow --      Pain Score 02/24/20 1223 6     Pain Loc --      Pain Edu? --      Excl. in [place]? --     Constitutional: Alert and oriented.  Eyes: Conjunctivae are normal. Head: Atraumatic. Nose: No congestion/rhinnorhea. Mouth/Throat: Mucous membranes are moist.  Oropharynx non-erythematous. Neck: No stridor.  Cardiovascular: Normal rate, regular rhythm. Grossly normal heart sounds.  Good peripheral circulation. Respiratory: Normal respiratory effort.  No retractions. Lungs CTAB. Gastrointestinal: Soft and nontender. No distention. No abdominal bruits. No CVA tenderness. Musculoskeletal: No lower extremity tenderness nor edema.   Neurologic:  Normal speech and language. No gross focal neurologic deficits are appreciated. No gait instability. Skin:  Skin is warm, dry and intact. No rash noted.   ____________________________________________   LABS (all labs ordered are listed, but only abnormal results are displayed)  Labs Reviewed  URINALYSIS, COMPLETE (UACMP) WITH MICROSCOPIC - Abnormal; Notable for the following components:      Result Value   Color, Urine BROWN (*)    APPearance TURBID (*)    Glucose, UA   (*)    Value: TEST NOT REPORTED DUE TO COLOR INTERFERENCE OF URINE PIGMENT   Hgb urine dipstick   (*)    Value: TEST NOT REPORTED DUE TO COLOR INTERFERENCE OF URINE PIGMENT   Bilirubin Urine   (*)    Value: TEST NOT REPORTED DUE TO COLOR INTERFERENCE OF URINE PIGMENT   Ketones, ur   (*)    Value: TEST NOT REPORTED DUE TO COLOR INTERFERENCE OF  URINE PIGMENT   Protein, ur   (*)    Value: TEST NOT REPORTED DUE TO COLOR INTERFERENCE OF URINE PIGMENT   Nitrite   (*)    Value: TEST NOT REPORTED DUE TO COLOR INTERFERENCE OF URINE PIGMENT   Leukocytes,Ua   (*)    Value: TEST NOT REPORTED DUE TO COLOR INTERFERENCE OF URINE PIGMENT   RBC / HPF >50 (*)    WBC, UA >50 (*)    Bacteria, UA MANY (*)    Non Squamous Epithelial PRESENT (*)    All other components within normal limits  BASIC METABOLIC PANEL - Abnormal; Notable for the following components:   Glucose, Bld 116 (*)    BUN 40 (*)    Creatinine,  Ser 11.03 (*)    GFR, Estimated 5 (*)    All other components within normal limits  CBC - Abnormal; Notable for the following components:   RBC 3.02 (*)    Hemoglobin 9.7 (*)    HCT 28.8 (*)    Platelets 133 (*)    All other components within normal limits  URINE CULTURE  RESPIRATORY PANEL BY PCR   ____________________________________________  EKG   ____________________________________________  RADIOLOGY Gertha Calkin, personally viewed and evaluated these images (plain radiographs) as part of my medical decision making, as well as reviewing the written report by the radiologist.  ED MD interpretation: CT read by radiology reviewed by me does show a 7 mm obstructing stone in the distal ureter.  Radiology reads moderate right hydronephrosis I agree at least moderate.  Official radiology report(s): CT Renal Stone Study  Result Date: 02/24/2020 CLINICAL DATA:  Hematuria. EXAM: CT ABDOMEN AND PELVIS WITHOUT CONTRAST TECHNIQUE: Multidetector CT imaging of the abdomen and pelvis was performed following the standard protocol without IV contrast. COMPARISON:  October 17, 2017. FINDINGS: Lower chest: No acute abnormality. Hepatobiliary: No focal liver abnormality is seen. No gallstones, gallbladder wall thickening, or biliary dilatation. Pancreas: Unremarkable. No pancreatic ductal dilatation or surrounding inflammatory changes.  Spleen: Normal in size without focal abnormality. Adrenals/Urinary Tract: Adrenal glands appear normal. Left renal atrophy is noted. Nonobstructive left nephrolithiasis is noted. Several left renal cysts are noted. Moderate right hydroureteronephrosis is noted secondary to 7 mm calculus in distal right ureter. Urinary bladder is unremarkable. Stomach/Bowel: Stomach is within normal limits. Appendix appears normal. No evidence of bowel wall thickening, distention, or inflammatory changes. Vascular/Lymphatic: Aortic atherosclerosis. No enlarged abdominal or pelvic lymph nodes. Reproductive: Stable moderate prostatic enlargement is noted. Other: No abdominal wall hernia or abnormality. No abdominopelvic ascites. Musculoskeletal: No acute or significant osseous findings. IMPRESSION: 1. Moderate right hydroureteronephrosis is noted secondary to 7 mm distal right ureteral calculus. 2. Nonobstructive left nephrolithiasis. 3. Left renal atrophy. 4. Stable moderate prostatic enlargement. 5. Aortic atherosclerosis. Aortic Atherosclerosis (ICD10-I70.0). Electronically Signed   By: Marijo Conception M.D.   On: 02/24/2020 14:42    ____________________________________________   PROCEDURES  Procedure(s) performed (including Critical Care):  Procedures   ____________________________________________   INITIAL IMPRESSION / ASSESSMENT AND PLAN / ED COURSE  Discussed patient with Dr. Bernardo Heater on-call for urology.  Patient does not have a fever or white count.  Dr. Bernardo Heater feels he could probably benefit from some p.o. antibiotics but patient says when he tries to urinate it hurts way too much and he cannot go home because he cannot stand the pain.  We will give him some IV antibiotics I will monitor his blood pressure once he gets some pain medicine and if that does not come down with some Dilaudid I will have to give him something for his blood pressure.  He has been off of his antihypertensives per doctor's orders for  some time now.             ____________________________________________   FINAL CLINICAL IMPRESSION(S) / ED DIAGNOSES  Final diagnoses:  Urinary tract infection with hematuria, site unspecified  Renal colic     ED Discharge Orders    None      *Please note:  Harrold Fitchett was evaluated in Emergency Department on 02/24/2020 for the symptoms described in the history of present illness. He was evaluated in the context of the global COVID-19 pandemic, which necessitated consideration that the patient might  be at risk for infection with the SARS-CoV-2 virus that causes COVID-19. Institutional protocols and algorithms that pertain to the evaluation of patients at risk for COVID-19 are in a state of rapid change based on information released by regulatory bodies including the CDC and federal and state organizations. These policies and algorithms were followed during the patient's care in the ED.  Some ED evaluations and interventions may be delayed as a result of limited staffing during and the pandemic.*   Note:  This document was prepared using Dragon voice recognition software and may include unintentional dictation errors.    Nena Polio, MD 02/24/20 [phone]

## 2020-02-25 ENCOUNTER — Inpatient Hospital Stay: Payer: Medicare Other | Admitting: Registered Nurse

## 2020-02-25 ENCOUNTER — Encounter
Admission: EM | Disposition: A | Discharge status: Home / Self Care | Payer: Self-pay | Source: Home / Self Care | Attending: Family Medicine

## 2020-02-25 ENCOUNTER — Inpatient Hospital Stay: Payer: Medicare Other

## 2020-02-25 ENCOUNTER — Encounter: Payer: Self-pay | Admitting: Family Medicine

## 2020-02-25 DIAGNOSIS — R8281 Pyuria: Secondary | ICD-10-CM

## 2020-02-25 DIAGNOSIS — N201 Calculus of ureter: Secondary | ICD-10-CM

## 2020-02-25 HISTORY — PX: CYSTOSCOPY/URETEROSCOPY/HOLMIUM LASER/STENT PLACEMENT: SHX6546

## 2020-02-25 LAB — CBC
HCT: 26.4 % — ABNORMAL LOW (ref 39.0–52.0)
HCT: 26.4 % — ABNORMAL LOW (ref 39.0–52.0)
Hemoglobin: 8.8 g/dL — ABNORMAL LOW (ref 13.0–17.0)
Hemoglobin: 8.8 g/dL — ABNORMAL LOW (ref 13.0–17.0)
MCH: 31.8 pg (ref 26.0–34.0)
MCH: 31.8 pg (ref 26.0–34.0)
MCHC: 33.3 g/dL (ref 30.0–36.0)
MCHC: 33.3 g/dL (ref 30.0–36.0)
MCV: 95.3 fL (ref 80.0–100.0)
MCV: 95.3 fL (ref 80.0–100.0)
Platelets: 123 10*3/uL — ABNORMAL LOW (ref 150–400)
Platelets: 128 10*3/uL — ABNORMAL LOW (ref 150–400)
RBC: 2.77 MIL/uL — ABNORMAL LOW (ref 4.22–5.81)
RBC: 2.77 MIL/uL — ABNORMAL LOW (ref 4.22–5.81)
RDW: 13.4 % (ref 11.5–15.5)
RDW: 13.4 % (ref 11.5–15.5)
WBC: 5 10*3/uL (ref 4.0–10.5)
WBC: 5.1 10*3/uL (ref 4.0–10.5)
nRBC: 0 % (ref 0.0–0.2)
nRBC: 0 % (ref 0.0–0.2)

## 2020-02-25 LAB — HIV ANTIBODY (ROUTINE TESTING W REFLEX): HIV Screen 4th Generation wRfx: NONREACTIVE

## 2020-02-25 LAB — COMPREHENSIVE METABOLIC PANEL
ALT: 9 U/L (ref 0–44)
AST: 7 U/L — ABNORMAL LOW (ref 15–41)
Albumin: 3.4 g/dL — ABNORMAL LOW (ref 3.5–5.0)
Alkaline Phosphatase: 82 U/L (ref 38–126)
Anion gap: 14 (ref 5–15)
BUN: 49 mg/dL — ABNORMAL HIGH (ref 8–23)
CO2: 27 mmol/L (ref 22–32)
Calcium: 9 mg/dL (ref 8.9–10.3)
Chloride: 98 mmol/L (ref 98–111)
Creatinine, Ser: 12.84 mg/dL — ABNORMAL HIGH (ref 0.61–1.24)
GFR, Estimated: 4 mL/min — ABNORMAL LOW (ref >60)
Glucose, Bld: 93 mg/dL (ref 70–99)
Potassium: 3.7 mmol/L (ref 3.5–5.1)
Sodium: 139 mmol/L (ref 135–145)
Total Bilirubin: 0.8 mg/dL (ref 0.3–1.2)
Total Protein: 6.6 g/dL (ref 6.5–8.1)

## 2020-02-25 LAB — URINE CULTURE: Culture: <10000 — AB

## 2020-02-25 SURGERY — Surgical Case
Anesthesia: *Unknown

## 2020-02-25 SURGERY — CYSTOSCOPY/URETEROSCOPY/HOLMIUM LASER/STENT PLACEMENT
Anesthesia: General | Site: Ureter

## 2020-02-25 MED ORDER — LIDOCAINE HCL (CARDIAC) PF 100 MG/5ML IV SOSY
PREFILLED_SYRINGE | INTRAVENOUS | Status: DC | PRN
  Administered 2020-02-25: 60 mg via INTRAVENOUS

## 2020-02-25 MED ORDER — DEXMEDETOMIDINE (PRECEDEX) IN NS 20 MCG/5ML (4 MCG/ML) IV SYRINGE
PREFILLED_SYRINGE | INTRAVENOUS | Status: AC
  Filled 2020-02-25: qty 5

## 2020-02-25 MED ORDER — ONDANSETRON HCL 4 MG/2ML IJ SOLN
INTRAMUSCULAR | Status: DC | PRN
  Administered 2020-02-25: 4 mg via INTRAVENOUS

## 2020-02-25 MED ORDER — ROCURONIUM BROMIDE 100 MG/10ML IV SOLN
INTRAVENOUS | Status: DC | PRN
  Administered 2020-02-25: 40 mg via INTRAVENOUS
  Administered 2020-02-25: 30 mg via INTRAVENOUS

## 2020-02-25 MED ORDER — ONDANSETRON HCL 4 MG/2ML IJ SOLN
INTRAMUSCULAR | Status: AC
  Filled 2020-02-25: qty 2

## 2020-02-25 MED ORDER — FENTANYL CITRATE (PF) 100 MCG/2ML IJ SOLN
INTRAMUSCULAR | Status: DC | PRN
  Administered 2020-02-25 (×2): 50 ug via INTRAVENOUS

## 2020-02-25 MED ORDER — AMLODIPINE BESYLATE 5 MG PO TABS
5.0000 mg | ORAL_TABLET | Freq: Every day | ORAL | Status: DC
  Administered 2020-02-25 – 2020-02-26 (×2): 5 mg via ORAL
  Filled 2020-02-25 (×3): qty 1

## 2020-02-25 MED ORDER — HYDROMORPHONE HCL 1 MG/ML IJ SOLN
INTRAMUSCULAR | Status: AC
  Administered 2020-02-25: 17:00:00 0.5 mg via INTRAVENOUS
  Filled 2020-02-25: qty 1

## 2020-02-25 MED ORDER — SODIUM CHLORIDE 0.9 % IV SOLN: INTRAVENOUS | Status: DC | PRN

## 2020-02-25 MED ORDER — METOPROLOL TARTRATE 5 MG/5ML IV SOLN
INTRAVENOUS | Status: DC | PRN
  Administered 2020-02-25: 2 mg via INTRAVENOUS
  Administered 2020-02-25 (×2): 1 mg via INTRAVENOUS

## 2020-02-25 MED ORDER — DEXAMETHASONE SODIUM PHOSPHATE 10 MG/ML IJ SOLN
INTRAMUSCULAR | Status: DC | PRN
  Administered 2020-02-25: 6 mg via INTRAVENOUS

## 2020-02-25 MED ORDER — IOPAMIDOL (ISOVUE-200) INJECTION 41%
INTRAVENOUS | Status: DC | PRN
  Administered 2020-02-25: 14:00:00 11 mL via INTRAVENOUS

## 2020-02-25 MED ORDER — SUGAMMADEX SODIUM 200 MG/2ML IV SOLN
INTRAVENOUS | Status: DC | PRN
  Administered 2020-02-25: 200 mg via INTRAVENOUS

## 2020-02-25 MED ORDER — HYDRALAZINE HCL 20 MG/ML IJ SOLN
20.0000 mg | Freq: Four times a day (QID) | INTRAMUSCULAR | Status: DC | PRN
  Administered 2020-02-25 (×2): 20 mg via INTRAVENOUS
  Filled 2020-02-25 (×3): qty 1

## 2020-02-25 MED ORDER — SUGAMMADEX SODIUM 500 MG/5ML IV SOLN
INTRAVENOUS | Status: AC
  Filled 2020-02-25: qty 5

## 2020-02-25 MED ORDER — METOPROLOL TARTRATE 5 MG/5ML IV SOLN
INTRAVENOUS | Status: AC
  Filled 2020-02-25: qty 5

## 2020-02-25 MED ORDER — HYDROMORPHONE HCL 1 MG/ML IJ SOLN
0.5000 mg | INTRAMUSCULAR | Status: DC | PRN
  Administered 2020-02-25: 0.5 mg via INTRAVENOUS
  Filled 2020-02-25: qty 0.5

## 2020-02-25 MED ORDER — PROPOFOL 10 MG/ML IV BOLUS
INTRAVENOUS | Status: DC | PRN
  Administered 2020-02-25: 130 mg via INTRAVENOUS
  Administered 2020-02-25: 50 mg via INTRAVENOUS

## 2020-02-25 MED ORDER — PROPOFOL 10 MG/ML IV BOLUS
INTRAVENOUS | Status: AC
  Filled 2020-02-25: qty 20

## 2020-02-25 MED ORDER — FENTANYL CITRATE (PF) 100 MCG/2ML IJ SOLN
INTRAMUSCULAR | Status: AC
  Filled 2020-02-25: qty 2

## 2020-02-25 MED ORDER — DEXAMETHASONE SODIUM PHOSPHATE 10 MG/ML IJ SOLN
INTRAMUSCULAR | Status: AC
  Filled 2020-02-25: qty 1

## 2020-02-25 MED ORDER — DEXMEDETOMIDINE HCL 200 MCG/2ML IV SOLN
INTRAVENOUS | Status: DC | PRN
  Administered 2020-02-25: 12 ug via INTRAVENOUS
  Administered 2020-02-25: 8 ug via INTRAVENOUS

## 2020-02-25 SURGICAL SUPPLY — 30 items
BAG DRAIN CYSTO-URO LG1000N (MISCELLANEOUS) ×3 IMPLANT
BASKET ZERO TIP 1.9FR (BASKET) IMPLANT
BRUSH SCRUB EZ 1% IODOPHOR (MISCELLANEOUS) ×3 IMPLANT
CATH URETL 5X70 OPEN END (CATHETERS) IMPLANT
CNTNR SPEC 2.5X3XGRAD LEK (MISCELLANEOUS)
CONT SPEC 4OZ STER OR WHT (MISCELLANEOUS)
CONTAINER SPEC 2.5X3XGRAD LEK (MISCELLANEOUS) IMPLANT
DRAPE UTILITY 15X26 TOWEL STRL (DRAPES) ×3 IMPLANT
GLOVE BIOGEL PI IND STRL 7.5 (GLOVE) ×1 IMPLANT
GLOVE BIOGEL PI INDICATOR 7.5 (GLOVE) ×2
GOWN STRL REUS W/ TWL LRG LVL3 (GOWN DISPOSABLE) ×1 IMPLANT
GOWN STRL REUS W/ TWL XL LVL3 (GOWN DISPOSABLE) ×1 IMPLANT
GOWN STRL REUS W/TWL LRG LVL3 (GOWN DISPOSABLE) ×2
GOWN STRL REUS W/TWL XL LVL3 (GOWN DISPOSABLE) ×2
GUIDEWIRE STR DUAL SENSOR (WIRE) ×3 IMPLANT
INFUSOR MANOMETER BAG 3000ML (MISCELLANEOUS) ×3 IMPLANT
INTRODUCER DILATOR DOUBLE (INTRODUCER) IMPLANT
KIT TURNOVER CYSTO (KITS) ×3 IMPLANT
MANIFOLD NEPTUNE II (INSTRUMENTS) ×3 IMPLANT
PACK CYSTO AR (MISCELLANEOUS) ×3 IMPLANT
SET CYSTO W/LG BORE CLAMP LF (SET/KITS/TRAYS/PACK) ×3 IMPLANT
SHEATH URETERAL 12FRX35CM (MISCELLANEOUS) IMPLANT
SOL .9 NS 3000ML IRR  AL (IV SOLUTION) ×6
SOL .9 NS 3000ML IRR UROMATIC (IV SOLUTION) ×3 IMPLANT
STENT URET 6FRX24 CONTOUR (STENTS) ×3 IMPLANT
STENT URET 6FRX26 CONTOUR (STENTS) IMPLANT
SURGILUBE 2OZ TUBE FLIPTOP (MISCELLANEOUS) ×3 IMPLANT
TRACTIP FLEXIVA PULSE ID 200 (Laser) ×3 IMPLANT
VALVE UROSEAL ADJ ENDO (VALVE) IMPLANT
WATER STERILE IRR 1000ML POUR (IV SOLUTION) ×3 IMPLANT

## 2020-02-25 NOTE — Progress Notes (Signed)
[facility]  ROUNDING NOTE   Subjective:   [name] was admitted to [facility] on 09/32/3557 for Renal colic [D22] Urinary tract infection with hematuria, site unspecified [N39.0, R31.9] Hydronephrosis concurrent with and due to calculi of kidney and ureter [N13.2]   Patient is waiting for urology procedure . We are planning for dialysis later today.  Objective:  Vital signs in last 24 hours:  Temp:  [97.2 F (36.2 C)-98.6 F (37 C)] 97.2 F (36.2 C) (12/14 1410) Pulse Rate:  [64-79] 69 (12/14 1426) Resp:  [15-22] 16 (12/14 1426) BP: (165-203)/(82-100) 173/83 (12/14 1426) SpO2:  [93 %-100 %] 93 % (12/14 1426) Weight:  [82.8 kg] 82.8 kg (12/13 2324)  Weight change:  Filed Weights   02/24/20 1224 02/24/20 2324  Weight: 83.9 kg 82.8 kg    Intake/Output: I/O last 3 completed shifts: In: 100 [IV Piggyback:100] Out: -    Intake/Output this shift:  Total I/O In: 200 [I.V.:200] Out: -   Physical Exam: General: Resting in bed, in no acute distress  Head: Moist oral mucosal membranes  Eyes: Anicteric  Lungs:  Respirations even,unlabored, Lungs with fine crackles  Heart: Regular rate and rhythm  Abdomen:  Soft, nontender, non distended  Extremities:  no peripheral edema.  Neurologic: Oriented x 3  Skin: No acute  Lesions or rashes  Access: Left AVF + bruit, +thrill    Basic Metabolic Panel: Recent Labs  Lab 02/24/20 1224 02/24/20 2312 02/25/20 0729  NA 142  --  139  K 4.0  --  3.7  CL 98  --  98  CO2 32  --  27  GLUCOSE 116*  --  93  BUN 40*  --  49*  CREATININE 11.03* 11.94* 12.84*  CALCIUM 9.2  --  9.0    Liver Function Tests: Recent Labs  Lab 02/25/20 0729  AST 7*  ALT 9  ALKPHOS 82  BILITOT 0.8  PROT 6.6  ALBUMIN 3.4*   No results for input(s): LIPASE, AMYLASE in the last 168 hours. No results for input(s): AMMONIA in the last 168 hours.  CBC: Recent Labs  Lab 02/24/20 1224 02/25/20 0037 02/25/20 0729  WBC 4.1 5.0 5.1   HGB 9.7* 8.8* 8.8*  HCT 28.8* 26.4* 26.4*  MCV 95.4 95.3 95.3  PLT 133* 123* 128*    Cardiac Enzymes: No results for input(s): CKTOTAL, CKMB, CKMBINDEX, TROPONINI in the last 168 hours.  BNP: Invalid input(s): POCBNP  CBG: No results for input(s): GLUCAP in the last 168 hours.  Microbiology: Results for orders placed or performed during the hospital encounter of 02/24/20  Resp panel by RT-PCR (RSV, Flu A&B, Covid) Nasopharyngeal Swab     Status: None   Collection Time: 02/24/20  8:01 PM   Specimen: Nasopharyngeal Swab; Nasopharyngeal(NP) swabs in vial transport medium  Result Value Ref Range Status   SARS Coronavirus 2 by RT PCR NEGATIVE NEGATIVE Final    Comment: (NOTE) SARS-CoV-2 target nucleic acids are NOT DETECTED.  The SARS-CoV-2 RNA is generally detectable in upper respiratory specimens during the acute phase of infection. The lowest concentration of SARS-CoV-2 viral copies this assay can detect is 138 copies/mL. A negative result does not preclude SARS-Cov-2 infection and should not be used as the sole basis for treatment or other patient management decisions. A negative result may occur with  improper specimen collection/handling, submission of specimen other than nasopharyngeal swab, presence of viral mutation(s) within the areas targeted by this assay, and inadequate number of viral  copies(<138 copies/mL). A negative result must be combined with clinical observations, patient history, and epidemiological information. The expected result is Negative.  Fact Sheet for Patients:  EntrepreneurPulse.com.au  Fact Sheet for Healthcare Providers:  IncredibleEmployment.be  This test is no t yet approved or cleared by the [place] FDA and  has been authorized for detection and/or diagnosis of SARS-CoV-2 by FDA under an Emergency Use Authorization (EUA). This EUA will remain  in effect (meaning this test can be used) for the  duration of the COVID-19 declaration under Section 564(b)(1) of the Act, 21 U.S.C.section 360bbb-3(b)(1), unless the authorization is terminated  or revoked sooner.       Influenza A by PCR NEGATIVE NEGATIVE Final   Influenza B by PCR NEGATIVE NEGATIVE Final    Comment: (NOTE) The Xpert Xpress SARS-CoV-2/FLU/RSV plus assay is intended as an aid in the diagnosis of influenza from Nasopharyngeal swab specimens and should not be used as a sole basis for treatment. Nasal washings and aspirates are unacceptable for Xpert Xpress SARS-CoV-2/FLU/RSV testing.  Fact Sheet for Patients: EntrepreneurPulse.com.au  Fact Sheet for Healthcare Providers: IncredibleEmployment.be  This test is not yet approved or cleared by the [place] FDA and has been authorized for detection and/or diagnosis of SARS-CoV-2 by FDA under an Emergency Use Authorization (EUA). This EUA will remain in effect (meaning this test can be used) for the duration of the COVID-19 declaration under Section 564(b)(1) of the Act, 21 U.S.C. section 360bbb-3(b)(1), unless the authorization is terminated or revoked.     Resp Syncytial Virus by PCR NEGATIVE NEGATIVE Final    Comment: (NOTE) Fact Sheet for Patients: EntrepreneurPulse.com.au  Fact Sheet for Healthcare Providers: IncredibleEmployment.be  This test is not yet approved or cleared by the [place] FDA and has been authorized for detection and/or diagnosis of SARS-CoV-2 by FDA under an Emergency Use Authorization (EUA). This EUA will remain in effect (meaning this test can be used) for the duration of the COVID-19 declaration under Section 564(b)(1) of the Act, 21 U.S.C. section 360bbb-3(b)(1), unless the authorization is terminated or revoked.  Performed at [facility], [place] [address], [place], [place] [address]     Coagulation Studies: No results for input(s): LABPROT,  INR in the last 72 hours.  Urinalysis: Recent Labs    02/24/20 1226  COLORURINE BROWN*  LABSPEC 1.017  PHURINE TEST NOT REPORTED DUE TO COLOR INTERFERENCE OF URINE PIGMENT  GLUCOSEU TEST NOT REPORTED DUE TO COLOR INTERFERENCE OF URINE PIGMENT*  HGBUR TEST NOT REPORTED DUE TO COLOR INTERFERENCE OF URINE PIGMENT*  BILIRUBINUR TEST NOT REPORTED DUE TO COLOR INTERFERENCE OF URINE PIGMENT*  KETONESUR TEST NOT REPORTED DUE TO COLOR INTERFERENCE OF URINE PIGMENT*  PROTEINUR TEST NOT REPORTED DUE TO COLOR INTERFERENCE OF URINE PIGMENT*  NITRITE TEST NOT REPORTED DUE TO COLOR INTERFERENCE OF URINE PIGMENT*  LEUKOCYTESUR TEST NOT REPORTED DUE TO COLOR INTERFERENCE OF URINE PIGMENT*      Imaging: DG OR UROLOGY CYSTO IMAGE ([place])  Result Date: 02/25/2020 There is no interpretation for this exam.  This order is for images obtained during a surgical procedure.  Please See "Surgeries" Tab for more information regarding the procedure.   CT Renal Stone Study  Result Date: 02/24/2020 CLINICAL DATA:  Hematuria. EXAM: CT ABDOMEN AND PELVIS WITHOUT CONTRAST TECHNIQUE: Multidetector CT imaging of the abdomen and pelvis was performed following the standard protocol without IV contrast. COMPARISON:  October 17, 2017. FINDINGS: Lower chest: No acute abnormality. Hepatobiliary: No focal liver abnormality  is seen. No gallstones, gallbladder wall thickening, or biliary dilatation. Pancreas: Unremarkable. No pancreatic ductal dilatation or surrounding inflammatory changes. Spleen: Normal in size without focal abnormality. Adrenals/Urinary Tract: Adrenal glands appear normal. Left renal atrophy is noted. Nonobstructive left nephrolithiasis is noted. Several left renal cysts are noted. Moderate right hydroureteronephrosis is noted secondary to 7 mm calculus in distal right ureter. Urinary bladder is unremarkable. Stomach/Bowel: Stomach is within normal limits. Appendix appears normal. No evidence of bowel wall  thickening, distention, or inflammatory changes. Vascular/Lymphatic: Aortic atherosclerosis. No enlarged abdominal or pelvic lymph nodes. Reproductive: Stable moderate prostatic enlargement is noted. Other: No abdominal wall hernia or abnormality. No abdominopelvic ascites. Musculoskeletal: No acute or significant osseous findings. IMPRESSION: 1. Moderate right hydroureteronephrosis is noted secondary to 7 mm distal right ureteral calculus. 2. Nonobstructive left nephrolithiasis. 3. Left renal atrophy. 4. Stable moderate prostatic enlargement. 5. Aortic atherosclerosis. Aortic Atherosclerosis (ICD10-I70.0). Electronically Signed   By: Marijo Conception M.D.   On: 02/24/2020 14:42     Medications:   . [MAR Hold] cefTRIAXone (ROCEPHIN)  IV     . amLODipine  5 mg Oral Daily  . [MAR Hold] docusate sodium  100 mg Oral BID  . [MAR Hold] ferric citrate  630 mg Oral TID  . [MAR Hold] heparin injection (subcutaneous)  5,000 Units Subcutaneous Q8H   [MAR Hold] acetaminophen **OR** [MAR Hold] acetaminophen, [MAR Hold] hydrALAZINE, [MAR Hold] ondansetron **OR** [MAR Hold] ondansetron (ZOFRAN) IV  Assessment/ Plan:  Mr. Zohan Shiflet is a 64 y.o. black male with end stage renal disease on hemodialysis, nephrolithiasis, hypertension, congestive heart failure, COPD, BPH, renal cell carcinoma status post left renal cryoablation. Admitted to [facility] on 35/45/6256 for Renal colic [L89] Urinary tract infection with hematuria, site unspecified [N39.0, R31.9] Hydronephrosis concurrent with and due to calculi of kidney and ureter [N13.2]   [facility]) TTS [place]. Left AVF 83kg   # End Stage Renal Disease Patient is scheduled for urological procedure today We will plan to dialyze patient after the procedure Will continue TTS schedule  # Hypertension.  Not well controlled BP 173/83 this morning Patient is on Amlodipine   # Anemia of chronic kidney disease Lab Results  Component  Value Date   HGB 8.8 (L) 02/25/2020   # Secondary Hyperparathyroidism  02/13/20 labs show PTH 525 Lab Results  Component Value Date   CALCIUM 9.0 02/25/2020   PHOS 5.3 (H) 07/28/2017   Will continue Ferric citrate TID    LOS: 1 Elaya Droege 12/14/20212:30 PM

## 2020-02-25 NOTE — Anesthesia Preprocedure Evaluation (Addendum)
Anesthesia Evaluation  Patient identified by MRN, date of birth, ID band Patient awake    Reviewed: Allergy & Precautions, H&P , NPO status , Patient's Chart, lab work & pertinent test results  History of Anesthesia Complications Negative for: history of anesthetic complications  Airway Mallampati: II  TM Distance: >3 FB     Dental  (+) Chipped, Missing   Pulmonary neg sleep apnea, COPD, Current Smoker and Patient abstained from smoking.,     + decreased breath sounds      Cardiovascular hypertension, (-) angina+CHF  (-) Past MI and (-) Cardiac Stents (-) dysrhythmias  Rhythm:regular Rate:Normal  Echo 07/26/17: - Left ventricle: The cavity size was normal. Systolic function was  moderately reduced. The estimated ejection fraction was in the  range of 35% to 40%. Diffuse hypokinesis. Doppler parameters are  consistent with abnormal left ventricular relaxation (grade 1  diastolic dysfunction).  - Pulmonary arteries: Systolic pressure was moderately increased.  PA peak pressure: 45 mm Hg (S).    Neuro/Psych negative neurological ROS  negative psych ROS   GI/Hepatic negative GI ROS, Neg liver ROS,   Endo/Other  negative endocrine ROS  Renal/GU ESRFRenal disease     Musculoskeletal   Abdominal   Peds  Hematology  (+) Blood dyscrasia, anemia ,   Anesthesia Other Findings Past Medical History: No date: Anemia     Comment:  low iron No date: CHF (congestive heart failure) (HCC)     Comment:  pt denies No date: COPD (chronic obstructive pulmonary disease) (HCC) No date: Dialysis patient ([place]) No date: History of kidney stones No date: Hypertension No date: Kidney stones No date: Pneumonia No date: Renal disorder     Comment:  ESRD on HD (M,W,F)  Past Surgical History: No date: EXTRACORPOREAL SHOCK WAVE LITHOTRIPSY No date: LITHOTRIPSY     Comment:  x 5 No date: PROSTATE BIOPSY  BMI    Body Mass  Index: 24.76 kg/m      Reproductive/Obstetrics negative OB ROS                            Anesthesia Physical Anesthesia Plan  ASA: III  Anesthesia Plan: General ETT   Post-op Pain Management:    Induction:   PONV Risk Score and Plan: Ondansetron, Dexamethasone and Treatment may vary due to age or medical condition  Airway Management Planned:   Additional Equipment:   Intra-op Plan:   Post-operative Plan:   Informed Consent: I have reviewed the patients History and Physical, chart, labs and discussed the procedure including the risks, benefits and alternatives for the proposed anesthesia with the patient or authorized representative who has indicated his/her understanding and acceptance.     Dental Advisory Given  Plan Discussed with: Anesthesiologist, CRNA and Surgeon  Anesthesia Plan Comments:         Anesthesia Quick Evaluation

## 2020-02-25 NOTE — Anesthesia Procedure Notes (Signed)
Procedure Name: Intubation Date/Time: 02/25/2020 1:12 PM Performed by: Lia Foyer, CRNA Pre-anesthesia Checklist: Patient identified, Emergency Drugs available, Suction available and Patient being monitored Patient Re-evaluated:Patient Re-evaluated prior to induction Oxygen Delivery Method: Circle system utilized Preoxygenation: Pre-oxygenation with 100% oxygen Induction Type: IV induction Ventilation: Mask ventilation without difficulty Laryngoscope Size: McGraph and 4 Grade View: Grade I Tube type: Oral Tube size: 7.5 mm Number of attempts: 1 Airway Equipment and Method: Stylet and Oral airway Placement Confirmation: ETT inserted through vocal cords under direct vision,  positive ETCO2 and breath sounds checked- equal and bilateral Secured at: 21 cm Tube secured with: Tape Dental Injury: Teeth and Oropharynx as per pre-operative assessment

## 2020-02-25 NOTE — Progress Notes (Signed)
PROGRESS NOTE    Dwayne Reed  MWU:[id number] DOB: Jan 14, 1956 DOA: 02/24/2020 PCP: Elby Beck, FNP   Brief Narrative:  HPI: Dwayne Reed is a 64 y.o. male with medical history significant of end-stage renal disease on hemodialysis MWF , COPD, essential hypertension, BPH with LUTS history of renal cell cancer anemia of chronic disease, gout, hyperparathyroidism presented in the emergency department with increased frequency of urine associated with blood data started yesterday.  Patient reported he was in usual state of health until yesterday when he noticed pain while urination.  He also noticed small amount of blood while urination.  He denies any fever, chills, sore throat, cough, recent travel, sick contacts.  He does have history of kidney stones and thinking this could be kidney stone which is causing obstruction and suprapubic pain.  He denies any pain at rest but reports pain while urination.   ED Course: He was hemodynamically stable except elevated blood pressure. BP 197/100. Labs include: Sodium 142, potassium 4.0, chloride 98, bicarb 32, glucose 116, BUN 14, creatinine 11.03, calcium 9.2, anion gap 12, WBC 4.1, hemoglobin 9.7, hematocrit 28.8, platelet 133, UA shows many bacteria . CT renal : Moderate right hydroureteronephrosis is noted secondary to 7 mm distal right ureteral calculus. Nonobstructive left nephrolithiasis. Left renal atrophy. Stable moderate prostatic enlargement.Aortic atherosclerosis.  Assessment & Plan:   Principal Problem:   Hydronephrosis with renal and ureteral calculus obstruction Active Problems:   COPD (chronic obstructive pulmonary disease) (HCC)   Essential hypertension   ESRD on dialysis ([place])   Benign prostatic hyperplasia with lower urinary tract symptoms   History of renal cell cancer   Anemia of renal disease   Right nephrolithiasis/ureteral stone with moderate hydronephrosis and hematuria//UTI: Patient's UA is partially indicative of  UTI.  Continue current antibiotics and follow culture.  Urology on board and plan for cystoscopy and right ureteral stent placement today.  Appreciate urology help.  Patient has no symptoms.  End-stage renal disease: Continue hemodialysis Monday Wednesday and Friday.  Nephrology consulted.  Hypertensive urgency: Patient is not on any antihypertensive medications.  Blood pressure as high as 725 systolic yesterday and 366 systolic today.  Will initiate on 5 mg of amlodipine and continue as needed hydralazine.  COPD: Not in acute exacerbation. Continue inhalers.  DVT prophylaxis: Place and maintain sequential compression device Start: 02/25/20 1151 heparin injection 5,000 Units Start: 02/24/20 1800   Code Status: Full Code  Family Communication:  None present at bedside.  Plan of care discussed with patient in length and he verbalized understanding and agreed with it.  Status is: Inpatient  Remains inpatient appropriate because:Ongoing diagnostic testing needed not appropriate for outpatient work up   Dispo: The patient is from: Home              Anticipated d/c is to: Home              Anticipated d/c date is: 1 day              Patient currently is not medically stable to d/c.        Estimated body mass index is 24.76 kg/m as calculated from the following:   Height as of this encounter: 6' (1.829 m).   Weight as of this encounter: 82.8 kg.      Nutritional status:               Consultants:   Urology  Nephrology  Procedures:   Cystoscopy planned today  Antimicrobials:  Anti-infectives (From admission, onward)   Start     Dose/Rate Route Frequency Ordered Stop   02/24/20 1645  [MAR Hold]  cefTRIAXone (ROCEPHIN) 1 g in sodium chloride 0.9 % 100 mL IVPB        (MAR Hold since Tue 02/25/2020 at 1225.Hold Reason: Transfer to a Procedural area.)   1 g 200 mL/hr over 30 Minutes Intravenous Every 24 hours 02/24/20 1642     02/24/20 1600  cefTRIAXone  (ROCEPHIN) 1 g in sodium chloride 0.9 % 100 mL IVPB        1 g 200 mL/hr over 30 Minutes Intravenous  Once 02/24/20 1545 02/24/20 1739         Subjective: Patient seen and examined.  He tells me that he has no complaints.  He was just frustrated that his procedure got delayed 3 hours due to an emergency.  Objective: Vitals:   02/25/20 0433 02/25/20 0900 02/25/20 0933 02/25/20 1126  BP: (!) 165/85 (!) 171/88 (!) 173/82 (!) 184/94  Pulse: 73 73 68 69  Resp: 18 20 20 16   Temp: 98.4 F (36.9 C) 98 F (36.7 C) 98.5 F (36.9 C) 97.9 F (36.6 C)  TempSrc:   Oral Oral  SpO2: 98% 98% 96% 99%  Weight:      Height:        Intake/Output Summary (Last 24 hours) at 02/25/2020 1337 Last data filed at 02/24/2020 1739 Gross per 24 hour  Intake 100 ml  Output --  Net 100 ml   Filed Weights   02/24/20 1224 02/24/20 2324  Weight: 83.9 kg 82.8 kg    Examination:  General exam: Appears calm and comfortable  Respiratory system: Clear to auscultation. Respiratory effort normal. Cardiovascular system: S1 & S2 heard, RRR. No JVD, murmurs, rubs, gallops or clicks. No pedal edema. Gastrointestinal system: Abdomen is nondistended, soft and nontender. No organomegaly or masses felt. Normal bowel sounds heard. Central nervous system: Alert and oriented. No focal neurological deficits. Extremities: Symmetric 5 x 5 power. Skin: No rashes, lesions or ulcers Psychiatry: Judgement and insight appear normal. Mood & affect very upset   Data Reviewed: I have personally reviewed following labs and imaging studies  CBC: Recent Labs  Lab 02/24/20 1224 02/25/20 0037 02/25/20 0729  WBC 4.1 5.0 5.1  HGB 9.7* 8.8* 8.8*  HCT 28.8* 26.4* 26.4*  MCV 95.4 95.3 95.3  PLT 133* 123* 742*   Basic Metabolic Panel: Recent Labs  Lab 02/24/20 1224 02/24/20 2312 02/25/20 0729  NA 142  --  139  K 4.0  --  3.7  CL 98  --  98  CO2 32  --  27  GLUCOSE 116*  --  93  BUN 40*  --  49*  CREATININE 11.03*  11.94* 12.84*  CALCIUM 9.2  --  9.0   GFR: Estimated Creatinine Clearance: 6.3 mL/min (A) (by C-G formula based on SCr of 12.84 mg/dL (H)). Liver Function Tests: Recent Labs  Lab 02/25/20 0729  AST 7*  ALT 9  ALKPHOS 82  BILITOT 0.8  PROT 6.6  ALBUMIN 3.4*   No results for input(s): LIPASE, AMYLASE in the last 168 hours. No results for input(s): AMMONIA in the last 168 hours. Coagulation Profile: No results for input(s): INR, PROTIME in the last 168 hours. Cardiac Enzymes: No results for input(s): CKTOTAL, CKMB, CKMBINDEX, TROPONINI in the last 168 hours. BNP (last 3 results) No results for input(s): PROBNP in the last 8760 hours. HbA1C: No results for input(s): HGBA1C in  the last 72 hours. CBG: No results for input(s): GLUCAP in the last 168 hours. Lipid Profile: No results for input(s): CHOL, HDL, LDLCALC, TRIG, CHOLHDL, LDLDIRECT in the last 72 hours. Thyroid Function Tests: No results for input(s): TSH, T4TOTAL, FREET4, T3FREE, THYROIDAB in the last 72 hours. Anemia Panel: No results for input(s): VITAMINB12, FOLATE, FERRITIN, TIBC, IRON, RETICCTPCT in the last 72 hours. Sepsis Labs: No results for input(s): PROCALCITON, LATICACIDVEN in the last 168 hours.  Recent Results (from the past 240 hour(s))  Resp panel by RT-PCR (RSV, Flu A&B, Covid) Nasopharyngeal Swab     Status: None   Collection Time: 02/24/20  8:01 PM   Specimen: Nasopharyngeal Swab; Nasopharyngeal(NP) swabs in vial transport medium  Result Value Ref Range Status   SARS Coronavirus 2 by RT PCR NEGATIVE NEGATIVE Final    Comment: (NOTE) SARS-CoV-2 target nucleic acids are NOT DETECTED.  The SARS-CoV-2 RNA is generally detectable in upper respiratory specimens during the acute phase of infection. The lowest concentration of SARS-CoV-2 viral copies this assay can detect is 138 copies/mL. A negative result does not preclude SARS-Cov-2 infection and should not be used as the sole basis for treatment  or other patient management decisions. A negative result may occur with  improper specimen collection/handling, submission of specimen other than nasopharyngeal swab, presence of viral mutation(s) within the areas targeted by this assay, and inadequate number of viral copies(<138 copies/mL). A negative result must be combined with clinical observations, patient history, and epidemiological information. The expected result is Negative.  Fact Sheet for Patients:  EntrepreneurPulse.com.au  Fact Sheet for Healthcare Providers:  IncredibleEmployment.be  This test is no t yet approved or cleared by the [place] FDA and  has been authorized for detection and/or diagnosis of SARS-CoV-2 by FDA under an Emergency Use Authorization (EUA). This EUA will remain  in effect (meaning this test can be used) for the duration of the COVID-19 declaration under Section 564(b)(1) of the Act, 21 U.S.C.section 360bbb-3(b)(1), unless the authorization is terminated  or revoked sooner.       Influenza A by PCR NEGATIVE NEGATIVE Final   Influenza B by PCR NEGATIVE NEGATIVE Final    Comment: (NOTE) The Xpert Xpress SARS-CoV-2/FLU/RSV plus assay is intended as an aid in the diagnosis of influenza from Nasopharyngeal swab specimens and should not be used as a sole basis for treatment. Nasal washings and aspirates are unacceptable for Xpert Xpress SARS-CoV-2/FLU/RSV testing.  Fact Sheet for Patients: EntrepreneurPulse.com.au  Fact Sheet for Healthcare Providers: IncredibleEmployment.be  This test is not yet approved or cleared by the [place] FDA and has been authorized for detection and/or diagnosis of SARS-CoV-2 by FDA under an Emergency Use Authorization (EUA). This EUA will remain in effect (meaning this test can be used) for the duration of the COVID-19 declaration under Section 564(b)(1) of the Act, 21 U.S.C. section  360bbb-3(b)(1), unless the authorization is terminated or revoked.     Resp Syncytial Virus by PCR NEGATIVE NEGATIVE Final    Comment: (NOTE) Fact Sheet for Patients: EntrepreneurPulse.com.au  Fact Sheet for Healthcare Providers: IncredibleEmployment.be  This test is not yet approved or cleared by the [place] FDA and has been authorized for detection and/or diagnosis of SARS-CoV-2 by FDA under an Emergency Use Authorization (EUA). This EUA will remain in effect (meaning this test can be used) for the duration of the COVID-19 declaration under Section 564(b)(1) of the Act, 21 U.S.C. section 360bbb-3(b)(1), unless the authorization is terminated or revoked.  Performed at  [facility], [place] [address], [place], [place] [address]       Radiology Studies: DG OR UROLOGY CYSTO IMAGE ([place])  Result Date: 02/25/2020 There is no interpretation for this exam.  This order is for images obtained during a surgical procedure.  Please See "Surgeries" Tab for more information regarding the procedure.   CT Renal Stone Study  Result Date: 02/24/2020 CLINICAL DATA:  Hematuria. EXAM: CT ABDOMEN AND PELVIS WITHOUT CONTRAST TECHNIQUE: Multidetector CT imaging of the abdomen and pelvis was performed following the standard protocol without IV contrast. COMPARISON:  October 17, 2017. FINDINGS: Lower chest: No acute abnormality. Hepatobiliary: No focal liver abnormality is seen. No gallstones, gallbladder wall thickening, or biliary dilatation. Pancreas: Unremarkable. No pancreatic ductal dilatation or surrounding inflammatory changes. Spleen: Normal in size without focal abnormality. Adrenals/Urinary Tract: Adrenal glands appear normal. Left renal atrophy is noted. Nonobstructive left nephrolithiasis is noted. Several left renal cysts are noted. Moderate right hydroureteronephrosis is noted secondary to 7 mm calculus in distal right ureter. Urinary bladder is  unremarkable. Stomach/Bowel: Stomach is within normal limits. Appendix appears normal. No evidence of bowel wall thickening, distention, or inflammatory changes. Vascular/Lymphatic: Aortic atherosclerosis. No enlarged abdominal or pelvic lymph nodes. Reproductive: Stable moderate prostatic enlargement is noted. Other: No abdominal wall hernia or abnormality. No abdominopelvic ascites. Musculoskeletal: No acute or significant osseous findings. IMPRESSION: 1. Moderate right hydroureteronephrosis is noted secondary to 7 mm distal right ureteral calculus. 2. Nonobstructive left nephrolithiasis. 3. Left renal atrophy. 4. Stable moderate prostatic enlargement. 5. Aortic atherosclerosis. Aortic Atherosclerosis (ICD10-I70.0). Electronically Signed   By: Marijo Conception M.D.   On: 02/24/2020 14:42    Scheduled Meds: . [MAR Hold] docusate sodium  100 mg Oral BID  . [MAR Hold] ferric citrate  630 mg Oral TID  . [MAR Hold] heparin injection (subcutaneous)  5,000 Units Subcutaneous Q8H   Continuous Infusions: . [MAR Hold] cefTRIAXone (ROCEPHIN)  IV       LOS: 1 day   Time spent: 35 minutes   Darliss Cheney, MD [facility]  02/25/2020, 1:37 PM   To contact the attending provider between 7A-7P or the covering provider during after hours 7P-7A, please log into the web site [email or web address].

## 2020-02-25 NOTE — Op Note (Signed)
Preoperative diagnosis:  1. Right distal ureteral calculus 2. Pyuria  Postoperative diagnosis:  1. Same  Procedure:  1. Cystoscopy 2. Right ureteral stent placement (6FR/24 cm)  3. Right retrograde pyelography with interpretation   Surgeon: Nicki Reaper C. Keria Widrig, M.D.  Anesthesia: General  Complications: None  Intraoperative findings: 1. Cystoscopy-urethra normal in caliber without stricture; prominent lateral lobe enlargement with large intravesical median lobe; bladder mucosa without erythema, solid or papillary lesions 2. Right retrograde pyelogram-moderate to severe right hydronephrosis and hydroureter   EBL: Minimal  Specimens: Urine from right renal pelvis for C&S  Indication: Dwayne Reed is a 64 y.o. male with ESRD on hemodialysis.  He presented to the ED yesterday with gross hematuria and urinary frequency.  CT showed a 7 mm right distal ureteral calculus with moderate hydronephrosis/hydroureter.  Urinalysis with significant pyuria and microhematuria however he was afebrile and had no leukocytosis.  He was admitted to the hospital service and started on IV antibiotics.  I recommended cystoscopy with placement of a right ureteral catheter and if the urine above the calculus was clear to proceed with ureteroscopic removal and if suspicious for infection stent placement only.  After reviewing the management options for treatment, he elected to proceed with the above surgical procedure(s). We have discussed the potential benefits and risks of the procedure, side effects of the proposed treatment, the likelihood of the patient achieving the goals of the procedure, and any potential problems that might occur during the procedure or recuperation. Informed consent has been obtained.  Description of procedure:  The patient was taken to the operating room and general anesthesia was induced.  The patient was placed in the dorsal lithotomy position, prepped and draped in the usual sterile  fashion, and preoperative antibiotics were administered. A preoperative time-out was performed.   A 21 French cystoscope was lubricated, passed per urethra and advanced proximally into the bladder with findings as described above.  The large intravesical median lobe was hypervascular and friable which obscured vision and the right ureter orifice was unable to be notified.    The cystoscope was exchanged for a 24 French continuous-flow resectoscope with laser bridge.  The right ureteral orifice was able to be identified with better irrigant flow.  A 0.038 Sensor wire was placed to the laser bridge and into the right ureteral orifice and advanced to the renal pelvis under fluoroscopic guidance without difficulty.  A 5 French open-ended ureteral cath was then advanced over the wire to the region of the renal pelvis and the guidewire was removed.  Dark, purulent urine was aspirated and sent for culture.  Based on this finding it was elected not to perform ureteroscopic removal.  Retrograde pyelogram was then performed with findings described above.  The calculus was visualized in the upper portion of the distal ureter  The Sensor wire was replaced and the ureteral catheter was removed.  A 6FR/24 cm Contour ureteral stent was placed over the wire without difficulty.  There was a good curl in the renal pelvis noted on fluoroscopy and in the bladder noted under direct vision.  The resectoscope was removed and after anesthetic reversal he was transported to the PACU in stable condition.   Plan:  Will await upper tract urine culture results and definitive stone treatment will be planned in the future   [name], MD

## 2020-02-25 NOTE — Progress Notes (Signed)
RN noticed improvement with blood pressures at this time. Will continue to monitor.

## 2020-02-25 NOTE — H&P (View-Only) (Signed)
Urology Consult  Requesting physician: Dr. Dwyane Dee  Reason for consultation: Obstructing ureteral calculus with possible infection  Chief Complaint: Right flank pain  History of Present Illness: Dwayne Reed is a 64 y.o. male with end-stage renal disease on hemodialysis.  He has a prior history of recurrent stone disease with previous treatments including shockwave lithotripsy and ureteroscopic stone removal.  He presented to the ED 02/24/2020 with gross hematuria, increased frequency.  Voided urine did show microhematuria and pyuria.  No fever, chills and had no leukocytosis.  Since admission he does complain of a right flank pain.  Has remained afebrile since hospitalized  Past Medical History:  Diagnosis Date  . Anemia    low iron  . CHF (congestive heart failure) ([place])    pt denies  . COPD (chronic obstructive pulmonary disease) ([place])   . Dialysis patient ([place])   . History of kidney stones   . Hypertension   . Kidney stones   . Pneumonia   . Renal disorder    ESRD on HD (M,W,F)    Past Surgical History:  Procedure Laterality Date  . EXTRACORPOREAL SHOCK WAVE LITHOTRIPSY    . LITHOTRIPSY     x 5  . PROSTATE BIOPSY      Home Medications:  Current Meds  Medication Sig  . AURYXIA 1 GM 210 MG(Fe) tablet Take 3 tablets (630 mg total) by mouth 3 (three) times daily.  . B Complex-C-Zn-Folic Acid (DIALYVITE 725-DGUY 15) 0.8 MG TABS Take 1 tablet by mouth every evening.  . Turmeric 500 MG CAPS Take 500 mg by mouth daily.  Marland Kitchen zinc sulfate 220 (50 Zn) MG capsule Take 220 mg by mouth daily.    Allergies: No Known Allergies  Family History  Problem Relation Age of Onset  . Heart disease Mother   . Sarcoidosis Sister   . Cancer Maternal Grandmother        type unknown    Social History:  reports that he has been smoking. He has been smoking about 0.15 packs per day. He has never used smokeless tobacco. He reports current alcohol use. He reports that he does not use  drugs.  ROS: A complete review of systems was performed.  All systems are negative except for pertinent findings as noted.  Physical Exam:  Vital signs in last 24 hours: Temp:  [97.9 F (36.6 C)-98.6 F (37 C)] 97.9 F (36.6 C) (12/14 1126) Pulse Rate:  [68-79] 69 (12/14 1126) Resp:  [16-20] 16 (12/14 1126) BP: (165-203)/(82-100) 184/94 (12/14 1126) SpO2:  [96 %-100 %] 99 % (12/14 1126) Weight:  [82.8 kg] 82.8 kg (12/13 2324) Constitutional:  Alert and oriented, No acute distress HEENT: [place] AT, moist mucus membranes.  Trachea midline, no masses Cardiovascular: Regular rate and rhythm, no clubbing, cyanosis, or edema. Respiratory: Normal respiratory effort, lungs clear bilaterally GI: Abdomen is soft, nontender, nondistended, no abdominal masses GU: No CVA tenderness Skin: No rashes, bruises or suspicious lesions Lymph: No cervical or inguinal adenopathy Neurologic: Grossly intact, no focal deficits, moving all 4 extremities Psychiatric: Normal mood and affect   Laboratory Data:  Recent Labs    02/24/20 1224 02/25/20 0037 02/25/20 0729  WBC 4.1 5.0 5.1  HGB 9.7* 8.8* 8.8*  HCT 28.8* 26.4* 26.4*   Recent Labs    02/24/20 1224 02/24/20 2312 02/25/20 0729  NA 142  --  139  K 4.0  --  3.7  CL 98  --  98  CO2 32  --  27  GLUCOSE 116*  --  93  BUN 40*  --  49*  CREATININE 11.03* 11.94* 12.84*  CALCIUM 9.2  --  9.0   No results for input(s): LABPT, INR in the last 72 hours. No results for input(s): LABURIN in the last 72 hours. Results for orders placed or performed during the hospital encounter of 02/24/20  Resp panel by RT-PCR (RSV, Flu A&B, Covid) Nasopharyngeal Swab     Status: None   Collection Time: 02/24/20  8:01 PM   Specimen: Nasopharyngeal Swab; Nasopharyngeal(NP) swabs in vial transport medium  Result Value Ref Range Status   SARS Coronavirus 2 by RT PCR NEGATIVE NEGATIVE Final    Comment: (NOTE) SARS-CoV-2 target nucleic acids are NOT DETECTED.  The  SARS-CoV-2 RNA is generally detectable in upper respiratory specimens during the acute phase of infection. The lowest concentration of SARS-CoV-2 viral copies this assay can detect is 138 copies/mL. A negative result does not preclude SARS-Cov-2 infection and should not be used as the sole basis for treatment or other patient management decisions. A negative result may occur with  improper specimen collection/handling, submission of specimen other than nasopharyngeal swab, presence of viral mutation(s) within the areas targeted by this assay, and inadequate number of viral copies(<138 copies/mL). A negative result must be combined with clinical observations, patient history, and epidemiological information. The expected result is Negative.  Fact Sheet for Patients:  EntrepreneurPulse.com.au  Fact Sheet for Healthcare Providers:  IncredibleEmployment.be  This test is no t yet approved or cleared by the [place] FDA and  has been authorized for detection and/or diagnosis of SARS-CoV-2 by FDA under an Emergency Use Authorization (EUA). This EUA will remain  in effect (meaning this test can be used) for the duration of the COVID-19 declaration under Section 564(b)(1) of the Act, 21 U.S.C.section 360bbb-3(b)(1), unless the authorization is terminated  or revoked sooner.       Influenza A by PCR NEGATIVE NEGATIVE Final   Influenza B by PCR NEGATIVE NEGATIVE Final    Comment: (NOTE) The Xpert Xpress SARS-CoV-2/FLU/RSV plus assay is intended as an aid in the diagnosis of influenza from Nasopharyngeal swab specimens and should not be used as a sole basis for treatment. Nasal washings and aspirates are unacceptable for Xpert Xpress SARS-CoV-2/FLU/RSV testing.  Fact Sheet for Patients: EntrepreneurPulse.com.au  Fact Sheet for Healthcare Providers: IncredibleEmployment.be  This test is not yet approved or  cleared by the [place] FDA and has been authorized for detection and/or diagnosis of SARS-CoV-2 by FDA under an Emergency Use Authorization (EUA). This EUA will remain in effect (meaning this test can be used) for the duration of the COVID-19 declaration under Section 564(b)(1) of the Act, 21 U.S.C. section 360bbb-3(b)(1), unless the authorization is terminated or revoked.     Resp Syncytial Virus by PCR NEGATIVE NEGATIVE Final    Comment: (NOTE) Fact Sheet for Patients: EntrepreneurPulse.com.au  Fact Sheet for Healthcare Providers: IncredibleEmployment.be  This test is not yet approved or cleared by the [place] FDA and has been authorized for detection and/or diagnosis of SARS-CoV-2 by FDA under an Emergency Use Authorization (EUA). This EUA will remain in effect (meaning this test can be used) for the duration of the COVID-19 declaration under Section 564(b)(1) of the Act, 21 U.S.C. section 360bbb-3(b)(1), unless the authorization is terminated or revoked.  Performed at [facility], [place] [address], [place], [place] [address]      Radiologic Imaging: DG OR UROLOGY CYSTO IMAGE [facility] ONLY)  Result Date: 02/25/2020 There is no interpretation for this exam.  This order is for images obtained during a surgical procedure.  Please See "Surgeries" Tab for more information regarding the procedure.   CT Renal Stone Study  Result Date: 02/24/2020 CLINICAL DATA:  Hematuria. EXAM: CT ABDOMEN AND PELVIS WITHOUT CONTRAST TECHNIQUE: Multidetector CT imaging of the abdomen and pelvis was performed following the standard protocol without IV contrast. COMPARISON:  October 17, 2017. FINDINGS: Lower chest: No acute abnormality. Hepatobiliary: No focal liver abnormality is seen. No gallstones, gallbladder wall thickening, or biliary dilatation. Pancreas: Unremarkable. No pancreatic ductal dilatation or surrounding inflammatory changes. Spleen:  Normal in size without focal abnormality. Adrenals/Urinary Tract: Adrenal glands appear normal. Left renal atrophy is noted. Nonobstructive left nephrolithiasis is noted. Several left renal cysts are noted. Moderate right hydroureteronephrosis is noted secondary to 7 mm calculus in distal right ureter. Urinary bladder is unremarkable. Stomach/Bowel: Stomach is within normal limits. Appendix appears normal. No evidence of bowel wall thickening, distention, or inflammatory changes. Vascular/Lymphatic: Aortic atherosclerosis. No enlarged abdominal or pelvic lymph nodes. Reproductive: Stable moderate prostatic enlargement is noted. Other: No abdominal wall hernia or abnormality. No abdominopelvic ascites. Musculoskeletal: No acute or significant osseous findings. IMPRESSION: 1. Moderate right hydroureteronephrosis is noted secondary to 7 mm distal right ureteral calculus. 2. Nonobstructive left nephrolithiasis. 3. Left renal atrophy. 4. Stable moderate prostatic enlargement. 5. Aortic atherosclerosis. Aortic Atherosclerosis (ICD10-I70.0). Electronically Signed   By: Marijo Conception M.D.   On: 02/24/2020 14:42   CT images personally reviewed and interpreted   Impression/Assessment:   1.  Right distal ureteral calculus with moderate hydronephrosis  He has no fever, leukocytosis or clinical signs of infection  It is not uncommon to see pyuria and microhematuria on voided urine of hemodialysis patients  He desires to have stone removed if at all possible  Plan:   We will plan cystoscopy with placement of right ureteral stent  A ureteral catheter will be advanced to the renal pelvis and urine aspirated.  If the urine is clear will proceed with ureteroscopic stone removal.  The procedure was cussed in detail including potential risks of bleeding, infection and ureteral injury.  If a ureteral stent is placed in the stone not treated due to infection he will require a follow-up procedure for stone  treatment  All questions were answered and he desires to proceed   02/25/2020, 12:44 PM  [name],  MD

## 2020-02-25 NOTE — Consult Note (Signed)
Urology Consult  Requesting physician: Dr. Dwyane Dee  Reason for consultation: Obstructing ureteral calculus with possible infection  Chief Complaint: Right flank pain  History of Present Illness: Dwayne Reed is a 64 y.o. male with end-stage renal disease on hemodialysis.  He has a prior history of recurrent stone disease with previous treatments including shockwave lithotripsy and ureteroscopic stone removal.  He presented to the ED 02/24/2020 with gross hematuria, increased frequency.  Voided urine did show microhematuria and pyuria.  No fever, chills and had no leukocytosis.  Since admission he does complain of a right flank pain.  Has remained afebrile since hospitalized  Past Medical History:  Diagnosis Date  . Anemia    low iron  . CHF (congestive heart failure) ([place])    pt denies  . COPD (chronic obstructive pulmonary disease) ([place])   . Dialysis patient ([place])   . History of kidney stones   . Hypertension   . Kidney stones   . Pneumonia   . Renal disorder    ESRD on HD (M,W,F)    Past Surgical History:  Procedure Laterality Date  . EXTRACORPOREAL SHOCK WAVE LITHOTRIPSY    . LITHOTRIPSY     x 5  . PROSTATE BIOPSY      Home Medications:  Current Meds  Medication Sig  . AURYXIA 1 GM 210 MG(Fe) tablet Take 3 tablets (630 mg total) by mouth 3 (three) times daily.  . B Complex-C-Zn-Folic Acid (DIALYVITE 034-VQQV 15) 0.8 MG TABS Take 1 tablet by mouth every evening.  . Turmeric 500 MG CAPS Take 500 mg by mouth daily.  Marland Kitchen zinc sulfate 220 (50 Zn) MG capsule Take 220 mg by mouth daily.    Allergies: No Known Allergies  Family History  Problem Relation Age of Onset  . Heart disease Mother   . Sarcoidosis Sister   . Cancer Maternal Grandmother        type unknown    Social History:  reports that he has been smoking. He has been smoking about 0.15 packs per day. He has never used smokeless tobacco. He reports current alcohol use. He reports that he does not use  drugs.  ROS: A complete review of systems was performed.  All systems are negative except for pertinent findings as noted.  Physical Exam:  Vital signs in last 24 hours: Temp:  [97.9 F (36.6 C)-98.6 F (37 C)] 97.9 F (36.6 C) (12/14 1126) Pulse Rate:  [68-79] 69 (12/14 1126) Resp:  [16-20] 16 (12/14 1126) BP: (165-203)/(82-100) 184/94 (12/14 1126) SpO2:  [96 %-100 %] 99 % (12/14 1126) Weight:  [82.8 kg] 82.8 kg (12/13 2324) Constitutional:  Alert and oriented, No acute distress HEENT: [place] AT, moist mucus membranes.  Trachea midline, no masses Cardiovascular: Regular rate and rhythm, no clubbing, cyanosis, or edema. Respiratory: Normal respiratory effort, lungs clear bilaterally GI: Abdomen is soft, nontender, nondistended, no abdominal masses GU: No CVA tenderness Skin: No rashes, bruises or suspicious lesions Lymph: No cervical or inguinal adenopathy Neurologic: Grossly intact, no focal deficits, moving all 4 extremities Psychiatric: Normal mood and affect   Laboratory Data:  Recent Labs    02/24/20 1224 02/25/20 0037 02/25/20 0729  WBC 4.1 5.0 5.1  HGB 9.7* 8.8* 8.8*  HCT 28.8* 26.4* 26.4*   Recent Labs    02/24/20 1224 02/24/20 2312 02/25/20 0729  NA 142  --  139  K 4.0  --  3.7  CL 98  --  98  CO2 32  --  27  GLUCOSE 116*  --  93  BUN 40*  --  49*  CREATININE 11.03* 11.94* 12.84*  CALCIUM 9.2  --  9.0   No results for input(s): LABPT, INR in the last 72 hours. No results for input(s): LABURIN in the last 72 hours. Results for orders placed or performed during the hospital encounter of 02/24/20  Resp panel by RT-PCR (RSV, Flu A&B, Covid) Nasopharyngeal Swab     Status: None   Collection Time: 02/24/20  8:01 PM   Specimen: Nasopharyngeal Swab; Nasopharyngeal(NP) swabs in vial transport medium  Result Value Ref Range Status   SARS Coronavirus 2 by RT PCR NEGATIVE NEGATIVE Final    Comment: (NOTE) SARS-CoV-2 target nucleic acids are NOT DETECTED.  The  SARS-CoV-2 RNA is generally detectable in upper respiratory specimens during the acute phase of infection. The lowest concentration of SARS-CoV-2 viral copies this assay can detect is 138 copies/mL. A negative result does not preclude SARS-Cov-2 infection and should not be used as the sole basis for treatment or other patient management decisions. A negative result may occur with  improper specimen collection/handling, submission of specimen other than nasopharyngeal swab, presence of viral mutation(s) within the areas targeted by this assay, and inadequate number of viral copies(<138 copies/mL). A negative result must be combined with clinical observations, patient history, and epidemiological information. The expected result is Negative.  Fact Sheet for Patients:  EntrepreneurPulse.com.au  Fact Sheet for Healthcare Providers:  IncredibleEmployment.be  This test is no t yet approved or cleared by the [place] FDA and  has been authorized for detection and/or diagnosis of SARS-CoV-2 by FDA under an Emergency Use Authorization (EUA). This EUA will remain  in effect (meaning this test can be used) for the duration of the COVID-19 declaration under Section 564(b)(1) of the Act, 21 U.S.C.section 360bbb-3(b)(1), unless the authorization is terminated  or revoked sooner.       Influenza A by PCR NEGATIVE NEGATIVE Final   Influenza B by PCR NEGATIVE NEGATIVE Final    Comment: (NOTE) The Xpert Xpress SARS-CoV-2/FLU/RSV plus assay is intended as an aid in the diagnosis of influenza from Nasopharyngeal swab specimens and should not be used as a sole basis for treatment. Nasal washings and aspirates are unacceptable for Xpert Xpress SARS-CoV-2/FLU/RSV testing.  Fact Sheet for Patients: EntrepreneurPulse.com.au  Fact Sheet for Healthcare Providers: IncredibleEmployment.be  This test is not yet approved or  cleared by the [place] FDA and has been authorized for detection and/or diagnosis of SARS-CoV-2 by FDA under an Emergency Use Authorization (EUA). This EUA will remain in effect (meaning this test can be used) for the duration of the COVID-19 declaration under Section 564(b)(1) of the Act, 21 U.S.C. section 360bbb-3(b)(1), unless the authorization is terminated or revoked.     Resp Syncytial Virus by PCR NEGATIVE NEGATIVE Final    Comment: (NOTE) Fact Sheet for Patients: EntrepreneurPulse.com.au  Fact Sheet for Healthcare Providers: IncredibleEmployment.be  This test is not yet approved or cleared by the [place] FDA and has been authorized for detection and/or diagnosis of SARS-CoV-2 by FDA under an Emergency Use Authorization (EUA). This EUA will remain in effect (meaning this test can be used) for the duration of the COVID-19 declaration under Section 564(b)(1) of the Act, 21 U.S.C. section 360bbb-3(b)(1), unless the authorization is terminated or revoked.  Performed at [facility], [place] [address] [place], [place] [address]      Radiologic Imaging: DG OR UROLOGY CYSTO IMAGE [facility] ONLY)  Result Date: 02/25/2020 There is no interpretation for this exam.  This order is for images obtained during a surgical procedure.  Please See "Surgeries" Tab for more information regarding the procedure.   CT Renal Stone Study  Result Date: 02/24/2020 CLINICAL DATA:  Hematuria. EXAM: CT ABDOMEN AND PELVIS WITHOUT CONTRAST TECHNIQUE: Multidetector CT imaging of the abdomen and pelvis was performed following the standard protocol without IV contrast. COMPARISON:  October 17, 2017. FINDINGS: Lower chest: No acute abnormality. Hepatobiliary: No focal liver abnormality is seen. No gallstones, gallbladder wall thickening, or biliary dilatation. Pancreas: Unremarkable. No pancreatic ductal dilatation or surrounding inflammatory changes. Spleen:  Normal in size without focal abnormality. Adrenals/Urinary Tract: Adrenal glands appear normal. Left renal atrophy is noted. Nonobstructive left nephrolithiasis is noted. Several left renal cysts are noted. Moderate right hydroureteronephrosis is noted secondary to 7 mm calculus in distal right ureter. Urinary bladder is unremarkable. Stomach/Bowel: Stomach is within normal limits. Appendix appears normal. No evidence of bowel wall thickening, distention, or inflammatory changes. Vascular/Lymphatic: Aortic atherosclerosis. No enlarged abdominal or pelvic lymph nodes. Reproductive: Stable moderate prostatic enlargement is noted. Other: No abdominal wall hernia or abnormality. No abdominopelvic ascites. Musculoskeletal: No acute or significant osseous findings. IMPRESSION: 1. Moderate right hydroureteronephrosis is noted secondary to 7 mm distal right ureteral calculus. 2. Nonobstructive left nephrolithiasis. 3. Left renal atrophy. 4. Stable moderate prostatic enlargement. 5. Aortic atherosclerosis. Aortic Atherosclerosis (ICD10-I70.0). Electronically Signed   By: Marijo Conception M.D.   On: 02/24/2020 14:42   CT images personally reviewed and interpreted   Impression/Assessment:   1.  Right distal ureteral calculus with moderate hydronephrosis  He has no fever, leukocytosis or clinical signs of infection  It is not uncommon to see pyuria and microhematuria on voided urine of hemodialysis patients  He desires to have stone removed if at all possible  Plan:   We will plan cystoscopy with placement of right ureteral stent  A ureteral catheter will be advanced to the renal pelvis and urine aspirated.  If the urine is clear will proceed with ureteroscopic stone removal.  The procedure was cussed in detail including potential risks of bleeding, infection and ureteral injury.  If a ureteral stent is placed in the stone not treated due to infection he will require a follow-up procedure for stone  treatment  All questions were answered and he desires to proceed   02/25/2020, 12:44 PM  [name],  MD

## 2020-02-25 NOTE — Progress Notes (Signed)
RN noticed a decrease in oxygen saturation on room air 88%. Patient states no c/o any SOB when asked. Oxygen via [place] applied at 1LPM with noted increase of saturation to 93%. Will continue to monitor

## 2020-02-25 NOTE — Progress Notes (Signed)
Noticed improvement with blood pressure at this time.

## 2020-02-25 NOTE — Transfer of Care (Signed)
Immediate Anesthesia Transfer of Care Note  Patient: Dwayne Reed  Procedure(s) Performed: CYSTOSCOPY/URETEROSCOPY/HOLMIUM LASER/STENT PLACEMENT (N/A Ureter)  Patient Location: PACU  Anesthesia Type:General  Level of Consciousness: drowsy  Airway & Oxygen Therapy: Patient Spontanous Breathing and Patient connected to face mask oxygen  Post-op Assessment: Report given to RN and Post -op Vital signs reviewed and stable  Post vital signs: Reviewed and stable  Last Vitals:  Vitals Value Taken Time  BP 170/90 02/25/20 1410  Temp    Pulse 63 02/25/20 1413  Resp 17 02/25/20 1413  SpO2 100 % 02/25/20 1413  Vitals shown include unvalidated device data.  Last Pain:  Vitals:   02/25/20 1126  TempSrc: Oral  PainSc:          Complications: No complications documented.

## 2020-02-26 ENCOUNTER — Encounter: Payer: Self-pay | Admitting: Urology

## 2020-02-26 DIAGNOSIS — R350 Frequency of micturition: Secondary | ICD-10-CM

## 2020-02-26 DIAGNOSIS — N3943 Post-void dribbling: Secondary | ICD-10-CM

## 2020-02-26 DIAGNOSIS — N390 Urinary tract infection, site not specified: Secondary | ICD-10-CM

## 2020-02-26 DIAGNOSIS — N401 Enlarged prostate with lower urinary tract symptoms: Secondary | ICD-10-CM

## 2020-02-26 DIAGNOSIS — N132 Hydronephrosis with renal and ureteral calculous obstruction: Secondary | ICD-10-CM

## 2020-02-26 DIAGNOSIS — Z992 Dependence on renal dialysis: Secondary | ICD-10-CM

## 2020-02-26 DIAGNOSIS — N201 Calculus of ureter: Secondary | ICD-10-CM

## 2020-02-26 DIAGNOSIS — N186 End stage renal disease: Secondary | ICD-10-CM

## 2020-02-26 DIAGNOSIS — R319 Hematuria, unspecified: Secondary | ICD-10-CM

## 2020-02-26 LAB — BASIC METABOLIC PANEL
Anion gap: 12 (ref 5–15)
BUN: 31 mg/dL — ABNORMAL HIGH (ref 8–23)
CO2: 29 mmol/L (ref 22–32)
Calcium: 9.1 mg/dL (ref 8.9–10.3)
Chloride: 100 mmol/L (ref 98–111)
Creatinine, Ser: 9.06 mg/dL — ABNORMAL HIGH (ref 0.61–1.24)
GFR, Estimated: 6 mL/min — ABNORMAL LOW (ref >60)
Glucose, Bld: 98 mg/dL (ref 70–99)
Potassium: 4.4 mmol/L (ref 3.5–5.1)
Sodium: 141 mmol/L (ref 135–145)

## 2020-02-26 LAB — URINE CULTURE: Culture: NO GROWTH

## 2020-02-26 MED ORDER — CEPHALEXIN 250 MG PO CAPS
250.0000 mg | ORAL_CAPSULE | Freq: Two times a day (BID) | ORAL | Status: DC
  Filled 2020-02-26 (×2): qty 1

## 2020-02-26 MED ORDER — AMLODIPINE BESYLATE 10 MG PO TABS: 10.0000 mg | ORAL_TABLET | Freq: Every day | ORAL | 0 refills | Status: DC

## 2020-02-26 MED ORDER — AMLODIPINE BESYLATE 10 MG PO TABS: 10.0000 mg | ORAL_TABLET | Freq: Every day | ORAL | Status: DC

## 2020-02-26 MED ORDER — TAMSULOSIN HCL 0.4 MG PO CAPS: 0.4000 mg | ORAL_CAPSULE | Freq: Every day | ORAL | 0 refills | Status: DC

## 2020-02-26 MED ORDER — CARVEDILOL 12.5 MG PO TABS: 12.5000 mg | ORAL_TABLET | Freq: Two times a day (BID) | ORAL | Status: DC

## 2020-02-26 MED ORDER — CEPHALEXIN 250 MG PO CAPS: 250.0000 mg | ORAL_CAPSULE | Freq: Two times a day (BID) | ORAL | 0 refills | Status: AC

## 2020-02-26 MED ORDER — OXYBUTYNIN CHLORIDE ER 10 MG PO TB24: 10.0000 mg | ORAL_TABLET | Freq: Every day | ORAL | 0 refills | Status: AC

## 2020-02-26 MED ORDER — TAMSULOSIN HCL 0.4 MG PO CAPS: 0.4000 mg | ORAL_CAPSULE | Freq: Every day | ORAL | Status: DC

## 2020-02-26 MED ORDER — CARVEDILOL 12.5 MG PO TABS: 12.5000 mg | ORAL_TABLET | Freq: Two times a day (BID) | ORAL | 0 refills | Status: DC

## 2020-02-26 NOTE — Progress Notes (Addendum)
[facility]  ROUNDING NOTE   Subjective:   Mr. Edge Mauger was admitted to [facility] on 93/71/6967 for Renal colic [E93] Urinary tract infection with hematuria, site unspecified [N39.0, R31.9] Hydronephrosis concurrent with and due to calculi of kidney and ureter [N13.2]   Patient underwent urology procedure yesterday, got right ureteral stent placed.He received dialysis treatment post procedure.  Objective:  Vital signs in last 24 hours:  Temp:  [97.4 F (36.3 C)-99.3 F (37.4 C)] 98 F (36.7 C) (12/15 1115) Pulse Rate:  [65-93] 79 (12/15 1115) Resp:  [13-18] 16 (12/15 1115) BP: (136-193)/(70-102) 177/83 (12/15 1115) SpO2:  [88 %-100 %] 100 % (12/15 1115)  Weight change:  Filed Weights   02/24/20 1224 02/24/20 2324  Weight: 83.9 kg 82.8 kg    Intake/Output: I/O last 3 completed shifts: In: 300.6 [I.V.:200; IV Piggyback:100.6] Out: 0    Intake/Output this shift:  Total I/O In: 120 [P.O.:120] Out: -   Physical Exam: General: In no acute distress  Head: Normocephalic,atraumatic  Eyes: Sclerae and conjunctivae clear  Lungs:  Lungs clear to auscultation bilaterally, normal respiratory effort  Heart: S1S2, no rubs or gallops  Abdomen:  Soft, nontender, non distended  Extremities:  No peripheral edema.  Neurologic: Awake, alert,oriented  Skin: No acute  Lesions or rashes  Access: Right radiocephalic  AVF + bruit, +thrill    Basic Metabolic Panel: Recent Labs  Lab 02/24/20 1224 02/24/20 2312 02/25/20 0729 02/26/20 0543  NA 142  --  139 141  K 4.0  --  3.7 4.4  CL 98  --  98 100  CO2 32  --  27 29  GLUCOSE 116*  --  93 98  BUN 40*  --  49* 31*  CREATININE 11.03* 11.94* 12.84* 9.06*  CALCIUM 9.2  --  9.0 9.1    Liver Function Tests: Recent Labs  Lab 02/25/20 0729  AST 7*  ALT 9  ALKPHOS 82  BILITOT 0.8  PROT 6.6  ALBUMIN 3.4*   No results for input(s): LIPASE, AMYLASE in the last 168 hours. No results for input(s): AMMONIA in the last  168 hours.  CBC: Recent Labs  Lab 02/24/20 1224 02/25/20 0037 02/25/20 0729  WBC 4.1 5.0 5.1  HGB 9.7* 8.8* 8.8*  HCT 28.8* 26.4* 26.4*  MCV 95.4 95.3 95.3  PLT 133* 123* 128*    Cardiac Enzymes: No results for input(s): CKTOTAL, CKMB, CKMBINDEX, TROPONINI in the last 168 hours.  BNP: Invalid input(s): POCBNP  CBG: No results for input(s): GLUCAP in the last 168 hours.  Microbiology: Results for orders placed or performed during the hospital encounter of 02/24/20  Urine culture     Status: Abnormal   Collection Time: 02/24/20 12:24 PM   Specimen: Urine, Random  Result Value Ref Range Status   Specimen Description   Final    URINE, RANDOM Performed at [facility], [address], [place], [place] [address]    Special Requests   Final    NONE Performed at [facility], [address], [place], [place] [address]    Culture (A)  Final    <10,000 COLONIES/mL INSIGNIFICANT GROWTH Performed at [facility] [address], [place], [place] [address]    Report Status 02/25/2020 FINAL  Final  Resp panel by RT-PCR (RSV, Flu A&B, Covid) Nasopharyngeal Swab     Status: None   Collection Time: 02/24/20  8:01 PM   Specimen: Nasopharyngeal Swab; Nasopharyngeal(NP) swabs in vial transport medium  Result Value Ref Range Status   SARS Coronavirus 2 by RT PCR NEGATIVE NEGATIVE Final    Comment: (NOTE) SARS-CoV-2 target nucleic acids are NOT DETECTED.  The SARS-CoV-2 RNA is generally detectable in upper respiratory specimens during the acute phase of infection. The lowest concentration of SARS-CoV-2 viral copies this assay can detect is 138 copies/mL. A negative result does not preclude SARS-Cov-2 infection and should not be used as the sole basis for treatment or other patient management decisions. A negative result may occur with  improper specimen collection/handling, submission of specimen other than nasopharyngeal swab, presence of viral  mutation(s) within the areas targeted by this assay, and inadequate number of viral copies(<138 copies/mL). A negative result must be combined with clinical observations, patient history, and epidemiological information. The expected result is Negative.  Fact Sheet for Patients:  EntrepreneurPulse.com.au  Fact Sheet for Healthcare Providers:  IncredibleEmployment.be  This test is no t yet approved or cleared by the [place] FDA and  has been authorized for detection and/or diagnosis of SARS-CoV-2 by FDA under an Emergency Use Authorization (EUA). This EUA will remain  in effect (meaning this test can be used) for the duration of the COVID-19 declaration under Section 564(b)(1) of the Act, 21 U.S.C.section 360bbb-3(b)(1), unless the authorization is terminated  or revoked sooner.       Influenza A by PCR NEGATIVE NEGATIVE Final   Influenza B by PCR NEGATIVE NEGATIVE Final    Comment: (NOTE) The Xpert Xpress SARS-CoV-2/FLU/RSV plus assay is intended as an aid in the diagnosis of influenza from Nasopharyngeal swab specimens and should not be used as a sole basis for treatment. Nasal washings and aspirates are unacceptable for Xpert Xpress SARS-CoV-2/FLU/RSV testing.  Fact Sheet for Patients: EntrepreneurPulse.com.au  Fact Sheet for Healthcare Providers: IncredibleEmployment.be  This test is not yet approved or cleared by the [place] FDA and has been authorized for detection and/or diagnosis of SARS-CoV-2 by FDA under an Emergency Use Authorization (EUA). This EUA will remain in effect (meaning this test can be used) for the duration of the COVID-19 declaration under Section 564(b)(1) of the Act, 21 U.S.C. section 360bbb-3(b)(1), unless the authorization is terminated or revoked.     Resp Syncytial Virus by PCR NEGATIVE NEGATIVE Final    Comment: (NOTE) Fact Sheet for  Patients: EntrepreneurPulse.com.au  Fact Sheet for Healthcare Providers: IncredibleEmployment.be  This test is not yet approved or cleared by the [place] FDA and has been authorized for detection and/or diagnosis of SARS-CoV-2 by FDA under an Emergency Use Authorization (EUA). This EUA will remain in effect (meaning this test can be used) for the duration of the COVID-19 declaration under Section 564(b)(1) of the Act, 21 U.S.C. section 360bbb-3(b)(1), unless the authorization is terminated or revoked.  Performed at [facility], [place] [address], [place], [place] [address]   Urine Culture     Status: None (Preliminary result)   Collection Time: 02/25/20  1:45 PM   Specimen: PATH Other; Urine  Result Value Ref Range Status   Specimen Description   Final    URINE, CATHETERIZED Performed at [facility], [place] [address] [place], [place] [address]    Special Requests   Final    RIGHT RENAL PELVIS Performed at [facility], [address] [place], [place] [address]    Culture PENDING  Incomplete   Report Status PENDING  Incomplete    Coagulation Studies: No results for input(s): LABPROT, INR in the last 72 hours.  Urinalysis: Recent Labs    02/24/20 1226  COLORURINE BROWN*  LABSPEC 1.017  PHURINE TEST NOT REPORTED DUE TO COLOR INTERFERENCE OF URINE PIGMENT  GLUCOSEU TEST NOT REPORTED DUE TO COLOR INTERFERENCE OF URINE PIGMENT*  HGBUR TEST NOT REPORTED DUE TO COLOR INTERFERENCE OF URINE PIGMENT*  BILIRUBINUR TEST NOT REPORTED DUE TO COLOR INTERFERENCE OF URINE PIGMENT*  KETONESUR TEST NOT REPORTED DUE TO COLOR INTERFERENCE OF URINE PIGMENT*  PROTEINUR TEST NOT REPORTED DUE TO COLOR INTERFERENCE OF URINE PIGMENT*  NITRITE TEST NOT REPORTED DUE TO COLOR INTERFERENCE OF URINE PIGMENT*  LEUKOCYTESUR TEST NOT REPORTED DUE TO COLOR INTERFERENCE OF URINE PIGMENT*      Imaging: DG OR UROLOGY CYSTO IMAGE ([place])  Result Date: 02/25/2020 There is no interpretation for this exam.  This order is for images obtained during a surgical procedure.  Please See "Surgeries" Tab for more information regarding the procedure.     Medications:    . [START ON 02/27/2020] amLODipine  10 mg Oral Daily  . carvedilol  12.5 mg Oral BID WC  . cephALEXin  250 mg Oral Q12H  . docusate sodium  100 mg Oral BID  . ferric citrate  630 mg Oral TID  . heparin injection (subcutaneous)  5,000 Units Subcutaneous Q8H  . tamsulosin  0.4 mg Oral Daily   acetaminophen **OR** acetaminophen, hydrALAZINE, HYDROmorphone (DILAUDID) injection, ondansetron **OR** ondansetron (ZOFRAN) IV  Assessment/ Plan:  Mr. Chane Cowden is a 64 y.o. black male with end stage renal disease on hemodialysis, nephrolithiasis, hypertension, congestive heart failure, COPD, BPH, renal cell carcinoma status post left renal cryoablation. Admitted to [facility] on 41/32/4401 for Renal colic [U27] Urinary tract infection with hematuria, site unspecified [N39.0, R31.9] Hydronephrosis concurrent with and due to calculi of kidney and ureter [N13.2]   [facility]) TTS [place]. Left AVF 83kg   # End Stage Renal Disease TTS Patient received dialysis treatment yesterday Volume and electrolyte status acceptable No additional dialysis required today Will continue TTS schedule  # Hypertension.  BP readings stay above goal Patient is on Amlodipine and Carvedilol  # Anemia of chronic kidney disease Lab Results  Component Value Date   HGB 8.8 (L) 02/25/2020  Will continue monitoring CBCs  # Secondary Hyperparathyroidism  02/13/20 labs show PTH 525 Lab Results  Component Value Date   CALCIUM 9.1 02/26/2020   PHOS 5.3 (H) 07/28/2017  Patient is on Ferric citrate    LOS: 2 Neasia Fleeman 12/15/20212:35 PM

## 2020-02-26 NOTE — Discharge Summary (Signed)
[facility] [place] NAME: Dwayne Reed    MR#:  [id number]  DATE OF BIRTH:  23-Oct-1955  DATE OF ADMISSION:  02/24/2020 ADMITTING PHYSICIAN: Shawna Clamp, MD  DATE OF DISCHARGE: 02/26/2020  PRIMARY CARE PHYSICIAN: Elby Beck, FNP    ADMISSION DIAGNOSIS:  Renal colic [O67] Urinary tract infection with hematuria, site unspecified [N39.0, R31.9] Hydronephrosis concurrent with and due to calculi of kidney and ureter [N13.2]  DISCHARGE DIAGNOSIS:  Right nephrolithiasis/ureteral stone status post right ureteral stent placement ESRD on HD HTN--uncontrolled /medicaiton non compliance SECONDARY DIAGNOSIS:   Past Medical History:  Diagnosis Date  . Anemia    low iron  . CHF (congestive heart failure) ([place])    pt denies  . COPD (chronic obstructive pulmonary disease) ([place])   . Dialysis patient ([place])   . History of kidney stones   . Hypertension   . Kidney stones   . Pneumonia   . Renal disorder    ESRD on HD (M,W,F)    HOSPITAL COURSE:  Dwayne Reed a 64 y.o.malewith medical history significant ofend-stage renal disease on hemodialysisMWF ,COPD, essential hypertension, BPH with LUTS history of renal cell cancer anemia of chronic disease,gout, hyperparathyroidism presented in the emergency department with increased frequency of urine associated with blood data started yesterday.    Right nephrolithiasis/ureteral stone with moderate hydronephrosis and hematuria//UTI:  --Patient's UA is partially indicative of UTI.  -- Continue current antibiotics for 14 days--culture negative  --patient is s/p cystoscopy and right ureteral stent placement by Dr. Bernardo Heater. -- Urine culture less than 10,000 and significant growth. Urology recommends 14 days of culture negative antibiotic. Will change to Keflex 250 BID renal dosing. -- Patient will follow-up urology for definitive treatment as outpatient. Currently  patient has no symptoms.  Okay to discharge from urology standpoint  End-stage renal disease: Continue hemodialysis Monday Wednesday and Friday.  Nephrology input appreciated. Patient got dialyzed yesterday. Okay to discharge from nephrology standpoint  Hypertensive urgency/uncontrolled hypertension: -- patient has been noncompliant with his hypotensive meds at home. Nephrology has discussed with him in the past regarding compliance for the same. -- Discussed with patient regarding components of blood pressure meds. He is asked to resume his amlodipine 10 mg daily and carvedilol 12.5 mg BID. -- Patient's wife was present during the conversation.  COPD: Not in acute exacerbation. Continue inhalers.  Overall hemodynamically stable. Will discharged to home.  DVT prophylaxis:  sequential compression device Start: 02/25/20 1151 heparin injection 5,000 Units Start: 02/24/20 1800  Code Status: Full Code  Family Communication:   wife in the room.   Status is: Inpatient    Dispo: The patient is from: Home  Anticipated d/c is to: Home  Anticipated d/c date is: today  Patient currently is  medically best at baseline   CONSULTS OBTAINED:  Treatment Team:  Abbie Sons, MD  DRUG ALLERGIES:  No Known Allergies  DISCHARGE MEDICATIONS:   Allergies as of 02/26/2020   No Known Allergies     Medication List    STOP taking these medications   NONFORMULARY OR COMPOUNDED ITEM     TAKE these medications   amLODipine 10 MG tablet Commonly known as: NORVASC Take 1 tablet (10 mg total) by mouth daily. Start taking on: February 27, 2020   Auryxia 1 GM 210 MG(Fe) tablet Generic drug: ferric citrate Take 3 tablets (630 mg total) by mouth 3 (three) times daily.   carvedilol 12.5 MG tablet Commonly known as:  COREG Take 1 tablet (12.5 mg total) by mouth 2 (two) times daily with a meal.   cephALEXin 250 MG capsule Commonly known as: KEFLEX Take 1 capsule (250 mg  total) by mouth every 12 (twelve) hours for 12 days.   Dialyvite 800-Zinc 15 0.8 MG Tabs Take 1 tablet by mouth every evening.   oxybutynin 10 MG 24 hr tablet Commonly known as: Ditropan XL Take 1 tablet (10 mg total) by mouth daily.   tamsulosin 0.4 MG Caps capsule Commonly known as: FLOMAX Take 1 capsule (0.4 mg total) by mouth daily.   Turmeric 500 MG Caps Take 500 mg by mouth daily.   zinc sulfate 220 (50 Zn) MG capsule Take 220 mg by mouth daily.       If you experience worsening of your admission symptoms, develop shortness of breath, life threatening emergency, suicidal or homicidal thoughts you must seek medical attention immediately by calling 911 or calling your MD immediately  if symptoms less severe.  You Must read complete instructions/literature along with all the possible adverse reactions/side effects for all the Medicines you take and that have been prescribed to you. Take any new Medicines after you have completely understood and accept all the possible adverse reactions/side effects.   Please note  You were cared for by a hospitalist during your hospital stay. If you have any questions about your discharge medications or the care you received while you were in the hospital after you are discharged, you can call the unit and asked to speak with the hospitalist on call if the hospitalist that took care of you is not available. Once you are discharged, your primary care physician will handle any further medical issues. Please note that NO REFILLS for any discharge medications will be authorized once you are discharged, as it is imperative that you return to your primary care physician (or establish a relationship with a primary care physician if you do not have one) for your aftercare needs so that they can reassess your need for medications and monitor your lab values. Today   SUBJECTIVE   No new complaints. Does not like to take medications given in the  hospital.  VITAL SIGNS:  Blood pressure (!) 177/83, pulse 79, temperature 98 F (36.7 C), temperature source Oral, resp. rate 16, height 6' (1.829 m), weight 82.8 kg, SpO2 100 %.  I/O:    Intake/Output Summary (Last 24 hours) at 02/26/2020 1205 Last data filed at 02/25/2020 1819 Gross per 24 hour  Intake 300.6 ml  Output 0 ml  Net 300.6 ml    PHYSICAL EXAMINATION:  GENERAL:  64 y.o.-year-old patient lying in the bed with no acute distress.  EYES: Pupils equal, round, reactive to light and accommodation. No scleral icterus.  HEENT: Head atraumatic, normocephalic. Oropharynx and nasopharynx clear.  NECK:  Supple, no jugular venous distention. No thyroid enlargement, no tenderness.  LUNGS: Normal breath sounds bilaterally, no wheezing, rales,rhonchi or crepitation. No use of accessory muscles of respiration.  CARDIOVASCULAR: S1, S2 normal. No murmurs, rubs, or gallops.  ABDOMEN: Soft, non-tender, non-distended. Bowel sounds present. No organomegaly or mass.  EXTREMITIES: No pedal edema, cyanosis, or clubbing. HD access NEUROLOGIC: Cranial nerves II through XII are intact. Muscle strength 5/5 in all extremities. Sensation intact. Gait not checked.  PSYCHIATRIC: The patient is alert and oriented x 3.  SKIN: No obvious rash, lesion, or ulcer.   DATA REVIEW:   CBC  Recent Labs  Lab 02/25/20 0729  WBC 5.1  HGB  8.8*  HCT 26.4*  PLT 128*    Chemistries  Recent Labs  Lab 02/25/20 0729 02/26/20 0543  NA 139 141  K 3.7 4.4  CL 98 100  CO2 27 29  GLUCOSE 93 98  BUN 49* 31*  CREATININE 12.84* 9.06*  CALCIUM 9.0 9.1  AST 7*  --   ALT 9  --   ALKPHOS 82  --   BILITOT 0.8  --     Microbiology Results   Recent Results (from the past 240 hour(s))  Urine culture     Status: Abnormal   Collection Time: 02/24/20 12:24 PM   Specimen: Urine, Random  Result Value Ref Range Status   Specimen Description   Final    URINE, RANDOM Performed at [facility], [address] [place], [place] [address]    Special Requests   Final    NONE Performed at [facility], [address] [place], [place] [address]    Culture (A)  Final    <10,000 COLONIES/mL INSIGNIFICANT GROWTH Performed at [facility], [place] [address] [place], [place] [address]    Report Status 02/25/2020 FINAL  Final  Resp panel by RT-PCR (RSV, Flu A&B, Covid) Nasopharyngeal Swab     Status: None   Collection Time: 02/24/20  8:01 PM   Specimen: Nasopharyngeal Swab; Nasopharyngeal(NP) swabs in vial transport medium  Result Value Ref Range Status   SARS Coronavirus 2 by RT PCR NEGATIVE NEGATIVE Final    Comment: (NOTE) SARS-CoV-2 target nucleic acids are NOT DETECTED.  The SARS-CoV-2 RNA is generally detectable in upper respiratory specimens during the acute phase of infection. The lowest concentration of SARS-CoV-2 viral copies this assay can detect is 138 copies/mL. A negative result does not preclude SARS-Cov-2 infection and should not be used as the sole basis for treatment or other patient management decisions. A negative result may occur with  improper specimen collection/handling, submission of specimen other than nasopharyngeal swab, presence of viral mutation(s) within the areas targeted by this assay, and inadequate number of viral copies(<138 copies/mL). A negative result must be combined with clinical observations, patient history, and epidemiological information. The expected result is Negative.  Fact Sheet for Patients:  EntrepreneurPulse.com.au  Fact Sheet for Healthcare Providers:  IncredibleEmployment.be  This test is no t yet approved or cleared by the [place] FDA and  has been authorized for detection and/or diagnosis of SARS-CoV-2 by FDA under an Emergency Use Authorization (EUA). This EUA will remain  in effect (meaning this test can be used) for the duration of the COVID-19 declaration under  Section 564(b)(1) of the Act, 21 U.S.C.section 360bbb-3(b)(1), unless the authorization is terminated  or revoked sooner.       Influenza A by PCR NEGATIVE NEGATIVE Final   Influenza B by PCR NEGATIVE NEGATIVE Final    Comment: (NOTE) The Xpert Xpress SARS-CoV-2/FLU/RSV plus assay is intended as an aid in the diagnosis of influenza from Nasopharyngeal swab specimens and should not be used as a sole basis for treatment. Nasal washings and aspirates are unacceptable for Xpert Xpress SARS-CoV-2/FLU/RSV testing.  Fact Sheet for Patients: EntrepreneurPulse.com.au  Fact Sheet for Healthcare Providers: IncredibleEmployment.be  This test is not yet approved or cleared by the [place] FDA and has been authorized for detection and/or diagnosis of SARS-CoV-2 by FDA under an Emergency Use Authorization (EUA). This EUA will remain in effect (meaning this test can be used) for the duration of the COVID-19 declaration under Section 564(b)(1)  of the Act, 21 U.S.C. section 360bbb-3(b)(1), unless the authorization is terminated or revoked.     Resp Syncytial Virus by PCR NEGATIVE NEGATIVE Final    Comment: (NOTE) Fact Sheet for Patients: EntrepreneurPulse.com.au  Fact Sheet for Healthcare Providers: IncredibleEmployment.be  This test is not yet approved or cleared by the [place] FDA and has been authorized for detection and/or diagnosis of SARS-CoV-2 by FDA under an Emergency Use Authorization (EUA). This EUA will remain in effect (meaning this test can be used) for the duration of the COVID-19 declaration under Section 564(b)(1) of the Act, 21 U.S.C. section 360bbb-3(b)(1), unless the authorization is terminated or revoked.  Performed at [facility], [place] [address] [place], [place] [address]   Urine Culture     Status: None (Preliminary result)   Collection Time: 02/25/20  1:45 PM   Specimen:  PATH Other; Urine  Result Value Ref Range Status   Specimen Description   Final    URINE, CATHETERIZED Performed at [facility], [place] [address], [place], [place] [address]    Special Requests   Final    RIGHT RENAL PELVIS Performed at [facility], [address] [place], [place] [address]    Culture PENDING  Incomplete   Report Status PENDING  Incomplete    RADIOLOGY:  DG OR UROLOGY CYSTO IMAGE ([place])  Result Date: 02/25/2020 There is no interpretation for this exam.  This order is for images obtained during a surgical procedure.  Please See "Surgeries" Tab for more information regarding the procedure.   CT Renal Stone Study  Result Date: 02/24/2020 CLINICAL DATA:  Hematuria. EXAM: CT ABDOMEN AND PELVIS WITHOUT CONTRAST TECHNIQUE: Multidetector CT imaging of the abdomen and pelvis was performed following the standard protocol without IV contrast. COMPARISON:  October 17, 2017. FINDINGS: Lower chest: No acute abnormality. Hepatobiliary: No focal liver abnormality is seen. No gallstones, gallbladder wall thickening, or biliary dilatation. Pancreas: Unremarkable. No pancreatic ductal dilatation or surrounding inflammatory changes. Spleen: Normal in size without focal abnormality. Adrenals/Urinary Tract: Adrenal glands appear normal. Left renal atrophy is noted. Nonobstructive left nephrolithiasis is noted. Several left renal cysts are noted. Moderate right hydroureteronephrosis is noted secondary to 7 mm calculus in distal right ureter. Urinary bladder is unremarkable. Stomach/Bowel: Stomach is within normal limits. Appendix appears normal. No evidence of bowel wall thickening, distention, or inflammatory changes. Vascular/Lymphatic: Aortic atherosclerosis. No enlarged abdominal or pelvic lymph nodes. Reproductive: Stable moderate prostatic enlargement is noted. Other: No abdominal wall hernia or abnormality. No abdominopelvic ascites. Musculoskeletal: No acute or  significant osseous findings. IMPRESSION: 1. Moderate right hydroureteronephrosis is noted secondary to 7 mm distal right ureteral calculus. 2. Nonobstructive left nephrolithiasis. 3. Left renal atrophy. 4. Stable moderate prostatic enlargement. 5. Aortic atherosclerosis. Aortic Atherosclerosis (ICD10-I70.0). Electronically Signed   By: Marijo Conception M.D.   On: 02/24/2020 14:42     CODE STATUS:     Code Status Orders  (From admission, onward)         Start     Ordered   02/24/20 1640  Full code  Continuous        02/24/20 1642        Code Status History    Date Active Date Inactive Code Status Order ID Comments User Context   07/25/2017 0914 07/28/2017 1732 Full Code [id number]  Nita Sells, MD ED   Advance Care Planning Activity       TOTAL TIME TAKING CARE OF THIS PATIENT: *45*  minutes.    Fritzi Mandes M.D  [facility]    CC: Primary care physician; Elby Beck, FNP

## 2020-02-26 NOTE — Anesthesia Postprocedure Evaluation (Signed)
Anesthesia Post Note  Patient: Dwayne Reed  Procedure(s) Performed: CYSTOSCOPY/URETEROSCOPY/HOLMIUM LASER/STENT PLACEMENT (N/A Ureter)  Patient location during evaluation: PACU Anesthesia Type: General Level of consciousness: awake and alert Pain management: pain level controlled Vital Signs Assessment: post-procedure vital signs reviewed and stable Respiratory status: spontaneous breathing, nonlabored ventilation and respiratory function stable Cardiovascular status: blood pressure returned to baseline and stable Postop Assessment: no apparent nausea or vomiting Anesthetic complications: no   No complications documented.   Last Vitals:  Vitals:   02/26/20 0510 02/26/20 0636  BP: (!) 169/94 (!) 168/92  Pulse: 79 80  Resp:    Temp:    SpO2:      Last Pain:  Vitals:   02/26/20 0429  TempSrc: Oral  PainSc:                  Tera Mater

## 2020-02-26 NOTE — Progress Notes (Signed)
Urology Inpatient Progress Note  Subjective: Afebrile, VSS. Intraoperative findings of pyuria from the right renal pelvis, culture pending.  On antibiotics as below. Today patient reports dysuria.  He is unhappy that he will have a right ureteral stent in place through Christmas.  He is refusing Flomax, stating it is "a bad medication."  He states he intends to do research on any medications he was prescribed and will decide whether or not he should take them on his own.  Anti-infectives: Anti-infectives (From admission, onward)   Start     Dose/Rate Route Frequency Ordered Stop   02/24/20 1645  cefTRIAXone (ROCEPHIN) 1 g in sodium chloride 0.9 % 100 mL IVPB        1 g 200 mL/hr over 30 Minutes Intravenous Every 24 hours 02/24/20 1642     02/24/20 1600  cefTRIAXone (ROCEPHIN) 1 g in sodium chloride 0.9 % 100 mL IVPB        1 g 200 mL/hr over 30 Minutes Intravenous  Once 02/24/20 1545 02/24/20 1739      Current Facility-Administered Medications  Medication Dose Route Frequency Provider Last Rate Last Admin   acetaminophen (TYLENOL) tablet 650 mg  650 mg Oral Q6H PRN Shawna Clamp, MD       Or   acetaminophen (TYLENOL) suppository 650 mg  650 mg Rectal Q6H PRN Shawna Clamp, MD       amLODipine (NORVASC) tablet 5 mg  5 mg Oral Daily Darliss Cheney, MD   5 mg at 02/26/20 0912   cefTRIAXone (ROCEPHIN) 1 g in sodium chloride 0.9 % 100 mL IVPB  1 g Intravenous Q24H Shawna Clamp, MD 200 mL/hr at 02/25/20 1654 1 g at 02/25/20 1654   docusate sodium (COLACE) capsule 100 mg  100 mg Oral BID Shawna Clamp, MD   100 mg at 02/26/20 0913   ferric citrate (AURYXIA) tablet 630 mg  630 mg Oral TID Shawna Clamp, MD   630 mg at 02/26/20 0913   heparin injection 5,000 Units  5,000 Units Subcutaneous Q8H Shawna Clamp, MD   5,000 Units at 02/26/20 6073   hydrALAZINE (APRESOLINE) injection 20 mg  20 mg Intravenous Q6H PRN Lang Snow, NP   20 mg at 02/25/20 2253   HYDROmorphone  (DILAUDID) injection 0.5 mg  0.5 mg Intravenous Q2H PRN Darliss Cheney, MD   0.5 mg at 02/25/20 2303   ondansetron (ZOFRAN) tablet 4 mg  4 mg Oral Q6H PRN Shawna Clamp, MD       Or   ondansetron [facility]) injection 4 mg  4 mg Intravenous Q6H PRN Shawna Clamp, MD       Objective: Vital signs in last 24 hours: Temp:  [97.2 F (36.2 C)-99.3 F (37.4 C)] 98 F (36.7 C) (12/15 0429) Pulse Rate:  [64-93] 80 (12/15 0636) Resp:  [13-22] 18 (12/15 0429) BP: (136-193)/(70-102) 168/92 (12/15 0636) SpO2:  [88 %-100 %] 99 % (12/15 0429)  Intake/Output from previous day: 12/14 0701 - 12/15 0700 In: 300.6 [I.V.:200; IV Piggyback:100.6] Out: 0  Intake/Output this shift: No intake/output data recorded.  Physical Exam Vitals and nursing note reviewed.  Constitutional:      General: He is not in acute distress.    Appearance: He is not ill-appearing, toxic-appearing or diaphoretic.  HENT:     Head: Normocephalic and atraumatic.  Pulmonary:     Effort: Pulmonary effort is normal. No respiratory distress.  Skin:    General: Skin is warm and dry.  Neurological:  Mental Status: He is alert.  Psychiatric:        Behavior: Behavior is agitated.    Lab Results:  Recent Labs    02/25/20 0037 02/25/20 0729  WBC 5.0 5.1  HGB 8.8* 8.8*  HCT 26.4* 26.4*  PLT 123* 128*   BMET Recent Labs    02/25/20 0729 02/26/20 0543  NA 139 141  K 3.7 4.4  CL 98 100  CO2 27 29  GLUCOSE 93 98  BUN 49* 31*  CREATININE 12.84* 9.06*  CALCIUM 9.0 9.1   Assessment & Plan: 64 year old male with ESRD on HD and an extensive urologic history now POD 1 from right ureteral stent placement for management of a 7 mm distal right ureteral stone with intraoperative findings of pyuria from the right renal pelvis.  He is having mild stent discomfort localized to the bladder.  I recommend Flomax and oxybutynin for management of his stent discomfort, however patient is refusing Flomax.  Do not recommend  Pyridium due to unknown dialyzability.  We discussed his treatment plan moving forward.  I explained that he will require 14 days total of culture appropriate antibiotics with plans for outpatient ureteroscopy with laser lithotripsy and stent exchange in 2 to 3 weeks.  Patient was argumentative about this plan but expressed understanding.  Recommendations: -Okay for discharge from urologic perspective -Continue empiric antibiotics and follow cultures.  He will ultimately require a total of 14 days of culture appropriate antibiotics. -Oxybutynin and Flomax for management of stent discomfort if patient is agreeable -Outpatient URS/LL/stent exchange in 2 to 3 weeks.  Our office will contact him to schedule this.  Thank you for involving me in the care of this patient.  Urology will sign off at this time.  Debroah Loop, PA-C 02/26/2020

## 2020-02-27 DIAGNOSIS — Z992 Dependence on renal dialysis: Secondary | ICD-10-CM | POA: Diagnosis not present

## 2020-02-27 DIAGNOSIS — D631 Anemia in chronic kidney disease: Secondary | ICD-10-CM | POA: Diagnosis not present

## 2020-02-27 DIAGNOSIS — Z23 Encounter for immunization: Secondary | ICD-10-CM | POA: Diagnosis not present

## 2020-02-27 DIAGNOSIS — N186 End stage renal disease: Secondary | ICD-10-CM | POA: Diagnosis not present

## 2020-02-27 DIAGNOSIS — N2581 Secondary hyperparathyroidism of renal origin: Secondary | ICD-10-CM | POA: Diagnosis not present

## 2020-02-29 DIAGNOSIS — Z992 Dependence on renal dialysis: Secondary | ICD-10-CM | POA: Diagnosis not present

## 2020-02-29 DIAGNOSIS — N2581 Secondary hyperparathyroidism of renal origin: Secondary | ICD-10-CM | POA: Diagnosis not present

## 2020-02-29 DIAGNOSIS — Z23 Encounter for immunization: Secondary | ICD-10-CM | POA: Diagnosis not present

## 2020-02-29 DIAGNOSIS — N186 End stage renal disease: Secondary | ICD-10-CM | POA: Diagnosis not present

## 2020-02-29 DIAGNOSIS — D631 Anemia in chronic kidney disease: Secondary | ICD-10-CM | POA: Diagnosis not present

## 2020-03-02 ENCOUNTER — Inpatient Hospital Stay: Payer: Medicare Other | Admitting: Family Medicine

## 2020-03-03 ENCOUNTER — Other Ambulatory Visit: Payer: Self-pay | Admitting: Radiology

## 2020-03-03 DIAGNOSIS — Z992 Dependence on renal dialysis: Secondary | ICD-10-CM | POA: Diagnosis not present

## 2020-03-03 DIAGNOSIS — D631 Anemia in chronic kidney disease: Secondary | ICD-10-CM | POA: Diagnosis not present

## 2020-03-03 DIAGNOSIS — Z23 Encounter for immunization: Secondary | ICD-10-CM | POA: Diagnosis not present

## 2020-03-03 DIAGNOSIS — N186 End stage renal disease: Secondary | ICD-10-CM | POA: Diagnosis not present

## 2020-03-03 DIAGNOSIS — N2581 Secondary hyperparathyroidism of renal origin: Secondary | ICD-10-CM | POA: Diagnosis not present

## 2020-03-03 DIAGNOSIS — N200 Calculus of kidney: Secondary | ICD-10-CM

## 2020-03-04 ENCOUNTER — Other Ambulatory Visit: Payer: Self-pay

## 2020-03-04 ENCOUNTER — Other Ambulatory Visit
Admission: RE | Admit: 2020-03-04 | Discharge: 2020-03-04 | Disposition: A | Discharge status: Home / Self Care | Payer: Medicare Other | Source: Ambulatory Visit | Attending: Urology | Admitting: Urology

## 2020-03-04 NOTE — Patient Instructions (Signed)
Your procedure is scheduled on: Monday March 09, 2020. Report to Day Surgery inside [facility] 2nd floor (stop by Registration desk first). To find out your arrival time please call [phone] between 1PM - 3PM on Thursday March 05, 2020.  Remember: Instructions that are not followed completely may result in serious medical risk,  up to and including death, or upon the discretion of your surgeon and anesthesiologist your  surgery may need to be rescheduled.     _X__ 1. Do not eat food after midnight the night before your procedure.                 No chewing gum or hard candies. DO not drink clear                 liquids the morning of your surgery.  __X__2.  On the morning of surgery brush your teeth with toothpaste and water, you                may rinse your mouth with mouthwash if you wish.  Do not swallow any toothpaste of mouthwash.     _X__ 3.  No Alcohol for 24 hours before or after surgery.   _X__ 4.  Do Not Smoke or use e-cigarettes For 24 Hours Prior to Your Surgery.                 Do not use any chewable tobacco products for at least 6 hours prior to                 Surgery.  _X__  5.  Do not use any recreational drugs (marijuana, cocaine, heroin, ecstasy, MDMA or other)                For at least one week prior to your surgery.  Combination of these drugs with anesthesia                May have life threatening results..   __X__ 6.  Notify your doctor if there is any change in your medical condition      (cold, fever, infections).     Do not wear jewelry, make-up, hairpins, clips or nail polish. Do not wear lotions, powders, or perfumes. You may wear deodorant. Do not shave 48 hours prior to surgery. Men may shave face and neck. Do not bring valuables to the hospital.    [facility] is not responsible for any belongings or valuables.  Contacts, dentures or bridgework may not be worn into surgery. Leave your suitcase in the car.  After surgery it may be brought to your room. For patients admitted to the hospital, discharge time is determined by your treatment team.   Patients discharged the day of surgery will not be allowed to drive home.   Make arrangements for someone to be with you for the first 24 hours of your Same Day Discharge.   __x__ Take these medicines the morning of surgery with A SIP OF WATER:    1.amLODipine (NORVASC) 10 MG   2. carvedilol (COREG) 12.5 MG   ____ Fleet Enema (as directed)   ____ Use CHG Soap (or wipes) as directed  ____ Use Benzoyl Peroxide Gel as instructed  ____ Use inhalers on the day of surgery  ____ Stop metformin 2 days prior to surgery    ____ Take 1/2 of usual insulin dose the night before surgery. No insulin the morning  of surgery.   ____ Stop Coumadin/Plavix/aspirin   __X__ Stop Anti-inflammatories such as Ibuprofen, Aleve, Advil, naproxen, aspirin and or BC powders.    __X__ Stop supplements until after surgery.    __X__ Do not start any herbal supplements before your procedure.    If you have any questions regarding your pre-procedure instructions,  Please call Pre-admit Testing at [phone].

## 2020-03-05 ENCOUNTER — Other Ambulatory Visit
Admission: RE | Admit: 2020-03-05 | Discharge: 2020-03-05 | Disposition: A | Discharge status: Home / Self Care | Payer: Medicare Other | Source: Ambulatory Visit | Attending: Urology | Admitting: Urology

## 2020-03-05 DIAGNOSIS — Z01810 Encounter for preprocedural cardiovascular examination: Secondary | ICD-10-CM | POA: Diagnosis not present

## 2020-03-05 DIAGNOSIS — Z20822 Contact with and (suspected) exposure to covid-19: Secondary | ICD-10-CM | POA: Diagnosis not present

## 2020-03-05 DIAGNOSIS — Z992 Dependence on renal dialysis: Secondary | ICD-10-CM | POA: Diagnosis not present

## 2020-03-05 DIAGNOSIS — I10 Essential (primary) hypertension: Secondary | ICD-10-CM | POA: Insufficient documentation

## 2020-03-05 DIAGNOSIS — N186 End stage renal disease: Secondary | ICD-10-CM | POA: Diagnosis not present

## 2020-03-05 DIAGNOSIS — N2581 Secondary hyperparathyroidism of renal origin: Secondary | ICD-10-CM | POA: Diagnosis not present

## 2020-03-05 DIAGNOSIS — Z23 Encounter for immunization: Secondary | ICD-10-CM | POA: Diagnosis not present

## 2020-03-05 DIAGNOSIS — D631 Anemia in chronic kidney disease: Secondary | ICD-10-CM | POA: Diagnosis not present

## 2020-03-05 DIAGNOSIS — Z01818 Encounter for other preprocedural examination: Secondary | ICD-10-CM | POA: Insufficient documentation

## 2020-03-06 ENCOUNTER — Other Ambulatory Visit
Admission: RE | Admit: 2020-03-06 | Discharge: 2020-03-06 | Disposition: A | Discharge status: Home / Self Care | Payer: Medicare Other | Source: Ambulatory Visit | Attending: Urology | Admitting: Urology

## 2020-03-06 ENCOUNTER — Other Ambulatory Visit: Payer: Self-pay

## 2020-03-06 DIAGNOSIS — Z01818 Encounter for other preprocedural examination: Secondary | ICD-10-CM | POA: Diagnosis not present

## 2020-03-06 DIAGNOSIS — I10 Essential (primary) hypertension: Secondary | ICD-10-CM | POA: Diagnosis not present

## 2020-03-06 DIAGNOSIS — Z20822 Contact with and (suspected) exposure to covid-19: Secondary | ICD-10-CM | POA: Diagnosis not present

## 2020-03-06 LAB — SARS CORONAVIRUS 2 (TAT 6-24 HRS): SARS Coronavirus 2: NEGATIVE

## 2020-03-08 DIAGNOSIS — Z992 Dependence on renal dialysis: Secondary | ICD-10-CM | POA: Diagnosis not present

## 2020-03-08 DIAGNOSIS — N186 End stage renal disease: Secondary | ICD-10-CM | POA: Diagnosis not present

## 2020-03-08 DIAGNOSIS — N2581 Secondary hyperparathyroidism of renal origin: Secondary | ICD-10-CM | POA: Diagnosis not present

## 2020-03-08 DIAGNOSIS — D631 Anemia in chronic kidney disease: Secondary | ICD-10-CM | POA: Diagnosis not present

## 2020-03-08 DIAGNOSIS — Z23 Encounter for immunization: Secondary | ICD-10-CM | POA: Diagnosis not present

## 2020-03-09 ENCOUNTER — Ambulatory Visit
Admission: RE | Admit: 2020-03-09 | Discharge: 2020-03-09 | Disposition: A | Discharge status: Home / Self Care | Payer: Medicare Other | Attending: Urology | Admitting: Urology

## 2020-03-09 ENCOUNTER — Ambulatory Visit: Payer: Medicare Other | Admitting: Anesthesiology

## 2020-03-09 ENCOUNTER — Encounter: Payer: Self-pay | Admitting: Urology

## 2020-03-09 ENCOUNTER — Other Ambulatory Visit: Payer: Self-pay

## 2020-03-09 ENCOUNTER — Encounter
Admission: RE | Disposition: A | Discharge status: Home / Self Care | Payer: Self-pay | Source: Home / Self Care | Attending: Urology

## 2020-03-09 ENCOUNTER — Ambulatory Visit: Payer: Medicare Other

## 2020-03-09 DIAGNOSIS — N200 Calculus of kidney: Secondary | ICD-10-CM

## 2020-03-09 DIAGNOSIS — N132 Hydronephrosis with renal and ureteral calculous obstruction: Secondary | ICD-10-CM | POA: Diagnosis not present

## 2020-03-09 DIAGNOSIS — Z992 Dependence on renal dialysis: Secondary | ICD-10-CM | POA: Insufficient documentation

## 2020-03-09 DIAGNOSIS — R310 Gross hematuria: Secondary | ICD-10-CM | POA: Diagnosis not present

## 2020-03-09 DIAGNOSIS — I509 Heart failure, unspecified: Secondary | ICD-10-CM | POA: Diagnosis not present

## 2020-03-09 DIAGNOSIS — N201 Calculus of ureter: Secondary | ICD-10-CM | POA: Diagnosis not present

## 2020-03-09 DIAGNOSIS — F17210 Nicotine dependence, cigarettes, uncomplicated: Secondary | ICD-10-CM | POA: Diagnosis not present

## 2020-03-09 DIAGNOSIS — N186 End stage renal disease: Secondary | ICD-10-CM | POA: Diagnosis not present

## 2020-03-09 DIAGNOSIS — Z79899 Other long term (current) drug therapy: Secondary | ICD-10-CM | POA: Diagnosis not present

## 2020-03-09 DIAGNOSIS — I132 Hypertensive heart and chronic kidney disease with heart failure and with stage 5 chronic kidney disease, or end stage renal disease: Secondary | ICD-10-CM | POA: Diagnosis not present

## 2020-03-09 HISTORY — PX: CYSTOSCOPY/URETEROSCOPY/HOLMIUM LASER/STENT PLACEMENT: SHX6546

## 2020-03-09 LAB — POCT I-STAT, CHEM 8
BUN: 30 mg/dL — ABNORMAL HIGH (ref 8–23)
Calcium, Ion: 1.06 mmol/L — ABNORMAL LOW (ref 1.15–1.40)
Chloride: 96 mmol/L — ABNORMAL LOW (ref 98–111)
Creatinine, Ser: 9.8 mg/dL — ABNORMAL HIGH (ref 0.61–1.24)
Glucose, Bld: 99 mg/dL (ref 70–99)
HCT: 24 % — ABNORMAL LOW (ref 39.0–52.0)
Hemoglobin: 8.2 g/dL — ABNORMAL LOW (ref 13.0–17.0)
Potassium: 3.8 mmol/L (ref 3.5–5.1)
Sodium: 136 mmol/L (ref 135–145)
TCO2: 32 mmol/L (ref 22–32)

## 2020-03-09 SURGERY — CYSTOSCOPY/URETEROSCOPY/HOLMIUM LASER/STENT PLACEMENT
Anesthesia: General | Laterality: Right

## 2020-03-09 MED ORDER — FENTANYL CITRATE (PF) 100 MCG/2ML IJ SOLN
INTRAMUSCULAR | Status: DC | PRN
  Administered 2020-03-09 (×2): 25 ug via INTRAVENOUS
  Administered 2020-03-09: 50 ug via INTRAVENOUS

## 2020-03-09 MED ORDER — ONDANSETRON HCL 4 MG/2ML IJ SOLN
INTRAMUSCULAR | Status: DC | PRN
  Administered 2020-03-09: 4 mg via INTRAVENOUS

## 2020-03-09 MED ORDER — FAMOTIDINE 20 MG PO TABS
ORAL_TABLET | ORAL | Status: AC
  Administered 2020-03-09: 07:00:00 20 mg via ORAL
  Filled 2020-03-09: qty 1

## 2020-03-09 MED ORDER — ONDANSETRON HCL 4 MG/2ML IJ SOLN: 4.0000 mg | Freq: Once | INTRAMUSCULAR | Status: DC | PRN

## 2020-03-09 MED ORDER — OXYCODONE HCL 5 MG PO TABS: 5.0000 mg | ORAL_TABLET | Freq: Once | ORAL | Status: DC | PRN

## 2020-03-09 MED ORDER — DEXAMETHASONE SODIUM PHOSPHATE 10 MG/ML IJ SOLN
INTRAMUSCULAR | Status: DC | PRN
  Administered 2020-03-09: 10 mg via INTRAVENOUS

## 2020-03-09 MED ORDER — SODIUM CHLORIDE 0.9 % IV SOLN
1.0000 g | INTRAVENOUS | Status: AC
  Administered 2020-03-09: 08:00:00 1 g via INTRAVENOUS
  Filled 2020-03-09: qty 10

## 2020-03-09 MED ORDER — FAMOTIDINE 20 MG PO TABS: 20.0000 mg | ORAL_TABLET | Freq: Once | ORAL | Status: AC

## 2020-03-09 MED ORDER — IOPAMIDOL (ISOVUE-200) INJECTION 41%
INTRAVENOUS | Status: DC | PRN
  Administered 2020-03-09: 08:00:00 20 mL via INTRAVENOUS

## 2020-03-09 MED ORDER — ORAL CARE MOUTH RINSE: 15.0000 mL | Freq: Once | OROMUCOSAL | Status: AC

## 2020-03-09 MED ORDER — FENTANYL CITRATE (PF) 100 MCG/2ML IJ SOLN: 25.0000 ug | INTRAMUSCULAR | Status: DC | PRN

## 2020-03-09 MED ORDER — MIDAZOLAM HCL 2 MG/2ML IJ SOLN
INTRAMUSCULAR | Status: DC | PRN
  Administered 2020-03-09 (×2): 1 mg via INTRAVENOUS

## 2020-03-09 MED ORDER — MIDAZOLAM HCL 2 MG/2ML IJ SOLN
INTRAMUSCULAR | Status: AC
  Filled 2020-03-09: qty 2

## 2020-03-09 MED ORDER — CHLORHEXIDINE GLUCONATE 0.12 % MT SOLN
OROMUCOSAL | Status: AC
  Administered 2020-03-09: 07:00:00 15 mL via OROMUCOSAL
  Filled 2020-03-09: qty 15

## 2020-03-09 MED ORDER — CHLORHEXIDINE GLUCONATE 0.12 % MT SOLN: 15.0000 mL | Freq: Once | OROMUCOSAL | Status: AC

## 2020-03-09 MED ORDER — OXYCODONE HCL 5 MG/5ML PO SOLN: 5.0000 mg | Freq: Once | ORAL | Status: DC | PRN

## 2020-03-09 MED ORDER — PROPOFOL 10 MG/ML IV BOLUS
INTRAVENOUS | Status: DC | PRN
  Administered 2020-03-09: 120 mg via INTRAVENOUS

## 2020-03-09 MED ORDER — LIDOCAINE HCL (CARDIAC) PF 100 MG/5ML IV SOSY
PREFILLED_SYRINGE | INTRAVENOUS | Status: DC | PRN
  Administered 2020-03-09: 60 mg via INTRAVENOUS

## 2020-03-09 MED ORDER — PHENYLEPHRINE HCL (PRESSORS) 10 MG/ML IV SOLN
INTRAVENOUS | Status: DC | PRN
  Administered 2020-03-09 (×2): 100 ug via INTRAVENOUS

## 2020-03-09 MED ORDER — SODIUM CHLORIDE 0.9 % IV SOLN: INTRAVENOUS | Status: DC

## 2020-03-09 MED ORDER — PROPOFOL 10 MG/ML IV BOLUS
INTRAVENOUS | Status: AC
  Filled 2020-03-09: qty 20

## 2020-03-09 MED ORDER — ROCURONIUM BROMIDE 100 MG/10ML IV SOLN
INTRAVENOUS | Status: DC | PRN
  Administered 2020-03-09: 50 mg via INTRAVENOUS
  Administered 2020-03-09: 10 mg via INTRAVENOUS

## 2020-03-09 MED ORDER — FENTANYL CITRATE (PF) 100 MCG/2ML IJ SOLN
INTRAMUSCULAR | Status: AC
  Filled 2020-03-09: qty 2

## 2020-03-09 SURGICAL SUPPLY — 38 items
BAG DRAIN CYSTO-URO LG1000N (MISCELLANEOUS) ×3 IMPLANT
BAG URO DRAIN 2000ML W/SPOUT (MISCELLANEOUS) ×3 IMPLANT
BASKET ZERO TIP 1.9FR (BASKET) ×3 IMPLANT
BRUSH SCRUB EZ 1% IODOPHOR (MISCELLANEOUS) ×3 IMPLANT
BSKT STON RTRVL ZERO TP 1.9FR (BASKET) ×1
CATH COUDE FOLEY 2W 5CC 20FR (CATHETERS) ×3 IMPLANT
CATH FOL 2WAY LX 20X30 (CATHETERS) ×3 IMPLANT
CATH URETL 5X70 OPEN END (CATHETERS) ×3 IMPLANT
CNTNR SPEC 2.5X3XGRAD LEK (MISCELLANEOUS) ×1
CONT SPEC 4OZ STER OR WHT (MISCELLANEOUS) ×2
CONT SPEC 4OZ STRL OR WHT (MISCELLANEOUS) ×1
CONTAINER SPEC 2.5X3XGRAD LEK (MISCELLANEOUS) ×1 IMPLANT
DRAPE UTILITY 15X26 TOWEL STRL (DRAPES) ×3 IMPLANT
GLOVE BIOGEL PI IND STRL 7.5 (GLOVE) ×1 IMPLANT
GLOVE BIOGEL PI INDICATOR 7.5 (GLOVE) ×2
GOWN STRL REUS W/ TWL LRG LVL3 (GOWN DISPOSABLE) ×1 IMPLANT
GOWN STRL REUS W/ TWL XL LVL3 (GOWN DISPOSABLE) ×1 IMPLANT
GOWN STRL REUS W/TWL LRG LVL3 (GOWN DISPOSABLE) ×3
GOWN STRL REUS W/TWL XL LVL3 (GOWN DISPOSABLE) ×3
GUIDEWIRE STR DUAL SENSOR (WIRE) ×3 IMPLANT
INFUSOR MANOMETER BAG 3000ML (MISCELLANEOUS) ×3 IMPLANT
INTRODUCER DILATOR DOUBLE (INTRODUCER) IMPLANT
KIT TURNOVER CYSTO (KITS) ×3 IMPLANT
MANIFOLD NEPTUNE II (INSTRUMENTS) IMPLANT
PACK CYSTO AR (MISCELLANEOUS) ×3 IMPLANT
SET CYSTO W/LG BORE CLAMP LF (SET/KITS/TRAYS/PACK) ×3 IMPLANT
SHEATH URETERAL 12FRX35CM (MISCELLANEOUS) IMPLANT
SOL .9 NS 3000ML IRR  AL (IV SOLUTION) ×2
SOL .9 NS 3000ML IRR AL (IV SOLUTION) ×1
SOL .9 NS 3000ML IRR UROMATIC (IV SOLUTION) ×1 IMPLANT
STENT URET 6FRX24 CONTOUR (STENTS) ×3 IMPLANT
STENT URET 6FRX26 CONTOUR (STENTS) IMPLANT
SURGILUBE 2OZ TUBE FLIPTOP (MISCELLANEOUS) ×3 IMPLANT
SYR TOOMEY IRRIG 70ML (MISCELLANEOUS) ×3
SYRINGE TOOMEY IRRIG 70ML (MISCELLANEOUS) ×1 IMPLANT
TRACTIP FLEXIVA PULSE ID 200 (Laser) ×3 IMPLANT
VALVE UROSEAL ADJ ENDO (VALVE) ×3 IMPLANT
WATER STERILE IRR 1000ML POUR (IV SOLUTION) ×3 IMPLANT

## 2020-03-09 NOTE — Anesthesia Postprocedure Evaluation (Signed)
Anesthesia Post Note  Patient: Dwayne Reed  Procedure(s) Performed: CYSTOSCOPY/URETEROSCOPY/HOLMIUM LASER/STENT Exchange (Right )  Patient location during evaluation: PACU Anesthesia Type: General Level of consciousness: awake and alert Pain management: pain level controlled Vital Signs Assessment: post-procedure vital signs reviewed and stable Respiratory status: spontaneous breathing, respiratory function stable and nonlabored ventilation Cardiovascular status: blood pressure returned to baseline and stable Postop Assessment: no apparent nausea or vomiting Anesthetic complications: no   No complications documented.   Last Vitals:  Vitals:   03/09/20 0925 03/09/20 0940  BP: 140/79 (!) 141/78  Pulse: 70 71  Resp: 18 13  Temp:    SpO2: 90% 95%    Last Pain:  Vitals:   03/09/20 0940  TempSrc:   PainSc: 0-No pain                 Brett Canales Devony Mcgrady

## 2020-03-09 NOTE — Anesthesia Procedure Notes (Signed)
Procedure Name: Intubation Date/Time: 03/09/2020 8:05 AM Performed by: Nelda Marseille, CRNA Pre-anesthesia Checklist: Patient identified, Patient being monitored, Timeout performed, Emergency Drugs available and Suction available Patient Re-evaluated:Patient Re-evaluated prior to induction Oxygen Delivery Method: Circle system utilized Preoxygenation: Pre-oxygenation with 100% oxygen Induction Type: IV induction Ventilation: Mask ventilation without difficulty Laryngoscope Size: Mac, 3 and McGraph Grade View: Grade I Tube type: Oral Tube size: 7.5 mm Number of attempts: 1 Airway Equipment and Method: Stylet Placement Confirmation: ETT inserted through vocal cords under direct vision,  positive ETCO2 and breath sounds checked- equal and bilateral Secured at: 21 cm Tube secured with: Tape Dental Injury: Teeth and Oropharynx as per pre-operative assessment

## 2020-03-09 NOTE — Anesthesia Preprocedure Evaluation (Addendum)
Anesthesia Evaluation  Patient identified by MRN, date of birth, ID band Patient awake    Reviewed: Allergy & Precautions, H&P , NPO status , Patient's Chart, lab work & pertinent test results  History of Anesthesia Complications Negative for: history of anesthetic complications  Airway Mallampati: II  TM Distance: >3 FB     Dental  (+) Missing, Chipped   Pulmonary neg sleep apnea, COPD, Current Smoker and Patient abstained from smoking.,    breath sounds clear to auscultation       Cardiovascular hypertension, (-) angina+CHF  (-) Past MI and (-) Cardiac Stents (-) dysrhythmias  Rhythm:regular Rate:Normal  Echo 07/26/17: - Left ventricle: The cavity size was normal. Systolic function was  moderately reduced. The estimated ejection fraction was in the  range of 35% to 40%. Diffuse hypokinesis. Doppler parameters are  consistent with abnormal left ventricular relaxation (grade 1  diastolic dysfunction).  - Pulmonary arteries: Systolic pressure was moderately increased.  PA peak pressure: 45 mm Hg (S).    Neuro/Psych negative neurological ROS  negative psych ROS   GI/Hepatic negative GI ROS, Neg liver ROS,   Endo/Other  negative endocrine ROS  Renal/GU ESRF and DialysisRenal disease     Musculoskeletal   Abdominal   Peds  Hematology  (+) Blood dyscrasia, anemia ,   Anesthesia Other Findings Past Medical History: No date: Anemia     Comment:  low iron No date: CHF (congestive heart failure) (HCC)     Comment:  pt denies No date: COPD (chronic obstructive pulmonary disease) (HCC) No date: Dialysis patient ([facility]) No date: History of kidney stones No date: Hypertension No date: Kidney stones No date: Pneumonia No date: Renal disorder     Comment:  ESRD on HD (M,W,F)  Past Surgical History: No date: EXTRACORPOREAL SHOCK WAVE LITHOTRIPSY No date: LITHOTRIPSY     Comment:  x 5 No date: PROSTATE  BIOPSY  BMI    Body Mass Index: 24.76 kg/m      Reproductive/Obstetrics negative OB ROS                            Anesthesia Physical  Anesthesia Plan  ASA: III  Anesthesia Plan: General ETT   Post-op Pain Management:    Induction:   PONV Risk Score and Plan: Ondansetron, Dexamethasone and Treatment may vary due to age or medical condition  Airway Management Planned:   Additional Equipment:   Intra-op Plan:   Post-operative Plan:   Informed Consent: I have reviewed the patients History and Physical, chart, labs and discussed the procedure including the risks, benefits and alternatives for the proposed anesthesia with the patient or authorized representative who has indicated his/her understanding and acceptance.     Dental Advisory Given  Plan Discussed with: Anesthesiologist, CRNA and Surgeon  Anesthesia Plan Comments:         Anesthesia Quick Evaluation

## 2020-03-09 NOTE — Interval H&P Note (Signed)
History and Physical Interval Note: Status post right ureteral stent placement 02/25/2020 for an obstructing right ureteral calculus with pyuria.  Urine culture was subsequently negative.  He presents today for definitive stone treatment.  The procedure has been discussed in detail including potential risks of bleeding, infection/sepsis, ureteral injury.  The potential need for stent replacement post procedure was discussed.  He indicated all questions were answered and desires to proceed.  03/09/2020 7:14 AM  Cecile Hearing  has presented today for surgery, with the diagnosis of .right ureteral calculus.  The various methods of treatment have been discussed with the patient and family. After consideration of risks, benefits and other options for treatment, the patient has consented to  Procedure(s): CYSTOSCOPY/URETEROSCOPY/HOLMIUM LASER/STENT Exchange (Right) as a surgical intervention.  The patient's history has been reviewed, patient examined, no change in status, stable for surgery.  I have reviewed the patient's chart and labs.  Questions were answered to the patient's satisfaction.     [place]

## 2020-03-09 NOTE — Progress Notes (Signed)
this RN spoke with Dr. Bernardo Heater, pt is to be d/c with foley and will come into office tomorrow for foley removal.

## 2020-03-09 NOTE — Transfer of Care (Signed)
Immediate Anesthesia Transfer of Care Note  Patient: Dwayne Reed  Procedure(s) Performed: CYSTOSCOPY/URETEROSCOPY/HOLMIUM LASER/STENT Exchange (Right )  Patient Location: PACU  Anesthesia Type:General  Level of Consciousness: awake and sedated  Airway & Oxygen Therapy: Patient Spontanous Breathing and Patient connected to face mask oxygen  Post-op Assessment: Report given to RN and Post -op Vital signs reviewed and stable  Post vital signs: Reviewed and stable  Last Vitals:  Vitals Value Taken Time  BP 158/80 03/09/20 0911  Temp 36.2 C 03/09/20 0910  Pulse 65 03/09/20 0914  Resp 14 03/09/20 0914  SpO2 99 % 03/09/20 0914  Vitals shown include unvalidated device data.  Last Pain:  Vitals:   03/09/20 0612  TempSrc: Temporal  PainSc: 0-No pain         Complications: No complications documented.

## 2020-03-09 NOTE — Discharge Instructions (Signed)

## 2020-03-09 NOTE — Op Note (Signed)
Preoperative diagnosis:  1. Right distal ureteral calculus  Postoperative diagnosis: 1.  Same  Procedure:  1. Cystoscopy 2. Right ureteroscopy and stone removal 3. Ureteroscopic laser lithotripsy 4. Right ureteral stent exchange (6FR/24 cm) 5. Right retrograde pyelography with interpretation  Surgeon: Nicki Reaper C. Kendric Sindelar, M.D.  Anesthesia: General  Complications: None  Intraoperative findings:  1.  Cystoscopy-normal caliber urethra without stricture; marked lateral enlargement with large, hypervascular intravesical median lobe; bladder mucosa without solid or papillary lesions 2.  Ureteroscopy-dilated upper distal, mid and proximal ureter; 2 calculi in the distal ureter 3.  Right retrograde pyelography post procedure showed no filling defects, stone fragments or contrast extravasation  EBL: Minimal  Specimens: 1. Calculus fragments for analysis   Indication: Dwayne Reed is a 64 y.o. year old patient with ESRD on dialysis.  Developed gross hematuria mid December and CT showed a 7 mm right distal ureteral calculus with severe hydronephrosis/hydroureter.  Although asymptomatic urinalysis showed significant pyuria.  He underwent cystoscopy and aspiration of urine from the renal pelvis was cloudy and a ureteral stent was placed.  He presents today for definitive stone treatment.  Intraoperative urine culture from the renal pelvis grew multiple species and he has been on empiric antibiotics and asymptomatic.  After reviewing the management options for treatment, the patient elected to proceed with the above surgical procedure(s). We have discussed the potential benefits and risks of the procedure, side effects of the proposed treatment, the likelihood of the patient achieving the goals of the procedure, and any potential problems that might occur during the procedure or recuperation. Informed consent has been obtained.  Description of procedure:  The patient was taken to the operating room  and general anesthesia was induced.  The patient was placed in the dorsal lithotomy position, prepped and draped in the usual sterile fashion, and preoperative antibiotics were administered. A preoperative time-out was performed.   A 21 French cystoscope was lubricated, passed per urethra and advanced into the bladder with findings as described above.  Upon introduction of the cystoscope oozing from the hypervascular median lobe was encountered.  The stent was grasped with endoscopic forceps and brought to the urethral meatus however a Sensor wire would not advance through the stent.  The cystoscope was repassed and the 0.038 Sensor wire was advanced into the right ureteral orifice up to the renal pelvis under fluoroscopic guidance. The ureteral stent was removed.  A 4.5 French semirigid scope was passed per urethra and advanced proximally.  The right ureteral orifice was engaged without difficulty and advanced proximally where the ureteral calculi were identified.  Using a 200 m holmium laser fiber they were fragmented into 4-5 pieces at a setting of 0.8 J / 8 Hz.  The fragments were then removed with a 1.9 [place] nitinol basket.  The semirigid ureteroscope was advanced to the proximal ureter and no additional calculi were seen.  A second sensor wire was placed through the ureteroscope and advanced the renal pelvis.  Prior CT did show nonobstructing renal calculi x2.  A single channel digital flex scope was placed over the second guidewire however could not be engaged into the distal ureter over the wire or alongside the wire.  Due to oozing from his enlarged median lobe it was elected not to perform dilation and to end the procedure.  The guidewire was backloaded on the cystoscope.  A 5 French open-ended ureteral catheter was placed over the wire and retrograde pyelogram was performed after wire removal with findings as described above.  The guidewire was replaced and the ureteral catheter was  removed.  A 6FR/24 cm Contour ureteral stent was advanced over the wire without difficulty.  There was a good curl seen in the renal pelvis on fluoroscopy and in the bladder under direct vision.  The bladder was emptied and cystoscope was removed.  A 20 French coud catheter was passed per urethra and inflated with 10 cc of sterile water.  The catheter was irrigated with return of medium red but translucent effluent.  Catheter was placed to gravity drainage  The patient appeared to tolerate the procedure well and without complications.  After anesthetic reversal the patient was transported to the PACU in stable condition.   [name], MD  Plan:  His hematuria will be monitored in the PACU with plan of catheter removal prior to discharge.  [name], MD

## 2020-03-10 ENCOUNTER — Ambulatory Visit (INDEPENDENT_AMBULATORY_CARE_PROVIDER_SITE_OTHER): Payer: Medicare Other | Admitting: Physician Assistant

## 2020-03-10 ENCOUNTER — Telehealth: Payer: Self-pay | Admitting: Radiology

## 2020-03-10 DIAGNOSIS — Z992 Dependence on renal dialysis: Secondary | ICD-10-CM | POA: Diagnosis not present

## 2020-03-10 DIAGNOSIS — N2581 Secondary hyperparathyroidism of renal origin: Secondary | ICD-10-CM | POA: Diagnosis not present

## 2020-03-10 DIAGNOSIS — Z466 Encounter for fitting and adjustment of urinary device: Secondary | ICD-10-CM

## 2020-03-10 DIAGNOSIS — N186 End stage renal disease: Secondary | ICD-10-CM | POA: Diagnosis not present

## 2020-03-10 DIAGNOSIS — Z23 Encounter for immunization: Secondary | ICD-10-CM | POA: Diagnosis not present

## 2020-03-10 DIAGNOSIS — D631 Anemia in chronic kidney disease: Secondary | ICD-10-CM | POA: Diagnosis not present

## 2020-03-10 NOTE — Progress Notes (Signed)
Catheter Removal  Patient is present today for a catheter removal.  6ml of water was drained from the balloon. A 14FR foley cath was removed from the bladder no complications were noted . Patient tolerated well.  Performed [id number] Kennia Vanvorst CMA

## 2020-03-10 NOTE — Telephone Encounter (Signed)
Patient states he was advised to call office if he had not urinated after catheter removal this morning. States he has not urinated after drinking 16 oz of water and a cup of coffee. Per Debroah Loop, PA-C advised him to drink 16 oz of waterand if he hasn't urinated within 1 hour, he should come in. Patient verbalized understanding.

## 2020-03-11 ENCOUNTER — Ambulatory Visit: Payer: Medicare Other | Admitting: Physician Assistant

## 2020-03-12 DIAGNOSIS — N2581 Secondary hyperparathyroidism of renal origin: Secondary | ICD-10-CM | POA: Diagnosis not present

## 2020-03-12 DIAGNOSIS — Z992 Dependence on renal dialysis: Secondary | ICD-10-CM | POA: Diagnosis not present

## 2020-03-12 DIAGNOSIS — D631 Anemia in chronic kidney disease: Secondary | ICD-10-CM | POA: Diagnosis not present

## 2020-03-12 DIAGNOSIS — N186 End stage renal disease: Secondary | ICD-10-CM | POA: Diagnosis not present

## 2020-03-12 DIAGNOSIS — Z23 Encounter for immunization: Secondary | ICD-10-CM | POA: Diagnosis not present

## 2020-03-13 LAB — CALCULI, WITH PHOTOGRAPH (CLINICAL LAB)
Calcium Oxalate Dihydrate: 20 %
Calcium Oxalate Monohydrate: 40 %
Hydroxyapatite: 40 %
Weight Calculi: 32 mg

## 2020-03-14 DIAGNOSIS — I129 Hypertensive chronic kidney disease with stage 1 through stage 4 chronic kidney disease, or unspecified chronic kidney disease: Secondary | ICD-10-CM | POA: Diagnosis not present

## 2020-03-14 DIAGNOSIS — N186 End stage renal disease: Secondary | ICD-10-CM | POA: Diagnosis not present

## 2020-03-14 DIAGNOSIS — Z992 Dependence on renal dialysis: Secondary | ICD-10-CM | POA: Diagnosis not present

## 2020-03-15 DIAGNOSIS — Z992 Dependence on renal dialysis: Secondary | ICD-10-CM | POA: Diagnosis not present

## 2020-03-15 DIAGNOSIS — D631 Anemia in chronic kidney disease: Secondary | ICD-10-CM | POA: Diagnosis not present

## 2020-03-15 DIAGNOSIS — N186 End stage renal disease: Secondary | ICD-10-CM | POA: Diagnosis not present

## 2020-03-15 DIAGNOSIS — N2581 Secondary hyperparathyroidism of renal origin: Secondary | ICD-10-CM | POA: Diagnosis not present

## 2020-03-15 DIAGNOSIS — D509 Iron deficiency anemia, unspecified: Secondary | ICD-10-CM | POA: Diagnosis not present

## 2020-03-17 DIAGNOSIS — N186 End stage renal disease: Secondary | ICD-10-CM | POA: Diagnosis not present

## 2020-03-17 DIAGNOSIS — N2581 Secondary hyperparathyroidism of renal origin: Secondary | ICD-10-CM | POA: Diagnosis not present

## 2020-03-17 DIAGNOSIS — D509 Iron deficiency anemia, unspecified: Secondary | ICD-10-CM | POA: Diagnosis not present

## 2020-03-17 DIAGNOSIS — Z992 Dependence on renal dialysis: Secondary | ICD-10-CM | POA: Diagnosis not present

## 2020-03-17 DIAGNOSIS — D631 Anemia in chronic kidney disease: Secondary | ICD-10-CM | POA: Diagnosis not present

## 2020-03-18 ENCOUNTER — Other Ambulatory Visit: Payer: Self-pay

## 2020-03-18 ENCOUNTER — Ambulatory Visit (INDEPENDENT_AMBULATORY_CARE_PROVIDER_SITE_OTHER): Payer: Medicare Other | Admitting: Urology

## 2020-03-18 ENCOUNTER — Encounter: Payer: Self-pay | Admitting: Urology

## 2020-03-18 VITALS — BP 157/84 | HR 98 | Ht 72.0 in | Wt 181.0 lb

## 2020-03-18 DIAGNOSIS — N200 Calculus of kidney: Secondary | ICD-10-CM

## 2020-03-18 MED ORDER — CIPROFLOXACIN HCL 500 MG PO TABS
500.0000 mg | ORAL_TABLET | Freq: Once | ORAL | Status: AC
  Administered 2020-03-18: 500 mg via ORAL

## 2020-03-18 NOTE — Progress Notes (Signed)
Indications:  65 y.o., who is s/p ureteroscopic removal of 2 left distal ureteral calculi on 03/09/2020.  He was initially stented 12/14 with hydronephrosis and pyuria.  He had no postoperative complaints.  The patient is presenting today for stent removal.  Procedure:  Flexible Cystoscopy with stent removal (97741)  Timeout was performed and the correct patient, procedure and participants were identified.    Description:  The patient was prepped and draped in the usual sterile fashion. Flexible cystosopy was performed.  The stent was visualized, grasped, and removed intact without difficulty. The patient tolerated the procedure well.  A single dose of oral antibiotics was given.  Complications:  None  Plan:   Instructed to call for fever/flank pain post stent removal  He has a follow-up w/ Dr. Erlene Quan next month for follow-up of low risk prostate cancer   [name], MD

## 2020-03-19 DIAGNOSIS — N2581 Secondary hyperparathyroidism of renal origin: Secondary | ICD-10-CM | POA: Diagnosis not present

## 2020-03-19 DIAGNOSIS — Z992 Dependence on renal dialysis: Secondary | ICD-10-CM | POA: Diagnosis not present

## 2020-03-19 DIAGNOSIS — D631 Anemia in chronic kidney disease: Secondary | ICD-10-CM | POA: Diagnosis not present

## 2020-03-19 DIAGNOSIS — N186 End stage renal disease: Secondary | ICD-10-CM | POA: Diagnosis not present

## 2020-03-19 DIAGNOSIS — D509 Iron deficiency anemia, unspecified: Secondary | ICD-10-CM | POA: Diagnosis not present

## 2020-03-19 LAB — MICROSCOPIC EXAMINATION
RBC, Urine: >30 /hpf — AB (ref 0–2)
WBC, UA: >30 /hpf — AB (ref 0–5)

## 2020-03-19 LAB — URINALYSIS, COMPLETE
Bilirubin, UA: NEGATIVE
Glucose, UA: NEGATIVE
Ketones, UA: NEGATIVE
Nitrite, UA: NEGATIVE
Specific Gravity, UA: 1.02 (ref 1.005–1.030)
Urobilinogen, Ur: 0.2 mg/dL (ref 0.2–1.0)
pH, UA: >9 — ABNORMAL HIGH (ref 5.0–7.5)

## 2020-03-20 LAB — CULTURE, URINE COMPREHENSIVE

## 2020-03-21 DIAGNOSIS — Z992 Dependence on renal dialysis: Secondary | ICD-10-CM | POA: Diagnosis not present

## 2020-03-21 DIAGNOSIS — N2581 Secondary hyperparathyroidism of renal origin: Secondary | ICD-10-CM | POA: Diagnosis not present

## 2020-03-21 DIAGNOSIS — N186 End stage renal disease: Secondary | ICD-10-CM | POA: Diagnosis not present

## 2020-03-21 DIAGNOSIS — D631 Anemia in chronic kidney disease: Secondary | ICD-10-CM | POA: Diagnosis not present

## 2020-03-21 DIAGNOSIS — D509 Iron deficiency anemia, unspecified: Secondary | ICD-10-CM | POA: Diagnosis not present

## 2020-03-22 ENCOUNTER — Encounter: Payer: Self-pay | Admitting: Urology

## 2020-03-24 DIAGNOSIS — N186 End stage renal disease: Secondary | ICD-10-CM | POA: Diagnosis not present

## 2020-03-24 DIAGNOSIS — D509 Iron deficiency anemia, unspecified: Secondary | ICD-10-CM | POA: Diagnosis not present

## 2020-03-24 DIAGNOSIS — N2581 Secondary hyperparathyroidism of renal origin: Secondary | ICD-10-CM | POA: Diagnosis not present

## 2020-03-24 DIAGNOSIS — Z992 Dependence on renal dialysis: Secondary | ICD-10-CM | POA: Diagnosis not present

## 2020-03-24 DIAGNOSIS — D631 Anemia in chronic kidney disease: Secondary | ICD-10-CM | POA: Diagnosis not present

## 2020-03-26 DIAGNOSIS — D631 Anemia in chronic kidney disease: Secondary | ICD-10-CM | POA: Diagnosis not present

## 2020-03-26 DIAGNOSIS — D509 Iron deficiency anemia, unspecified: Secondary | ICD-10-CM | POA: Diagnosis not present

## 2020-03-26 DIAGNOSIS — N2581 Secondary hyperparathyroidism of renal origin: Secondary | ICD-10-CM | POA: Diagnosis not present

## 2020-03-26 DIAGNOSIS — Z992 Dependence on renal dialysis: Secondary | ICD-10-CM | POA: Diagnosis not present

## 2020-03-26 DIAGNOSIS — N186 End stage renal disease: Secondary | ICD-10-CM | POA: Diagnosis not present

## 2020-03-28 DIAGNOSIS — D631 Anemia in chronic kidney disease: Secondary | ICD-10-CM | POA: Diagnosis not present

## 2020-03-28 DIAGNOSIS — N2581 Secondary hyperparathyroidism of renal origin: Secondary | ICD-10-CM | POA: Diagnosis not present

## 2020-03-28 DIAGNOSIS — D509 Iron deficiency anemia, unspecified: Secondary | ICD-10-CM | POA: Diagnosis not present

## 2020-03-28 DIAGNOSIS — N186 End stage renal disease: Secondary | ICD-10-CM | POA: Diagnosis not present

## 2020-03-28 DIAGNOSIS — Z992 Dependence on renal dialysis: Secondary | ICD-10-CM | POA: Diagnosis not present

## 2020-03-31 DIAGNOSIS — N186 End stage renal disease: Secondary | ICD-10-CM | POA: Diagnosis not present

## 2020-03-31 DIAGNOSIS — D631 Anemia in chronic kidney disease: Secondary | ICD-10-CM | POA: Diagnosis not present

## 2020-03-31 DIAGNOSIS — D509 Iron deficiency anemia, unspecified: Secondary | ICD-10-CM | POA: Diagnosis not present

## 2020-03-31 DIAGNOSIS — N2581 Secondary hyperparathyroidism of renal origin: Secondary | ICD-10-CM | POA: Diagnosis not present

## 2020-03-31 DIAGNOSIS — Z992 Dependence on renal dialysis: Secondary | ICD-10-CM | POA: Diagnosis not present

## 2020-04-02 DIAGNOSIS — N2581 Secondary hyperparathyroidism of renal origin: Secondary | ICD-10-CM | POA: Diagnosis not present

## 2020-04-02 DIAGNOSIS — Z992 Dependence on renal dialysis: Secondary | ICD-10-CM | POA: Diagnosis not present

## 2020-04-02 DIAGNOSIS — D631 Anemia in chronic kidney disease: Secondary | ICD-10-CM | POA: Diagnosis not present

## 2020-04-02 DIAGNOSIS — D509 Iron deficiency anemia, unspecified: Secondary | ICD-10-CM | POA: Diagnosis not present

## 2020-04-02 DIAGNOSIS — N186 End stage renal disease: Secondary | ICD-10-CM | POA: Diagnosis not present

## 2020-04-04 DIAGNOSIS — N186 End stage renal disease: Secondary | ICD-10-CM | POA: Diagnosis not present

## 2020-04-04 DIAGNOSIS — Z992 Dependence on renal dialysis: Secondary | ICD-10-CM | POA: Diagnosis not present

## 2020-04-04 DIAGNOSIS — D631 Anemia in chronic kidney disease: Secondary | ICD-10-CM | POA: Diagnosis not present

## 2020-04-04 DIAGNOSIS — N2581 Secondary hyperparathyroidism of renal origin: Secondary | ICD-10-CM | POA: Diagnosis not present

## 2020-04-04 DIAGNOSIS — D509 Iron deficiency anemia, unspecified: Secondary | ICD-10-CM | POA: Diagnosis not present

## 2020-04-07 DIAGNOSIS — N2581 Secondary hyperparathyroidism of renal origin: Secondary | ICD-10-CM | POA: Diagnosis not present

## 2020-04-07 DIAGNOSIS — N186 End stage renal disease: Secondary | ICD-10-CM | POA: Diagnosis not present

## 2020-04-07 DIAGNOSIS — D509 Iron deficiency anemia, unspecified: Secondary | ICD-10-CM | POA: Diagnosis not present

## 2020-04-07 DIAGNOSIS — D631 Anemia in chronic kidney disease: Secondary | ICD-10-CM | POA: Diagnosis not present

## 2020-04-07 DIAGNOSIS — Z992 Dependence on renal dialysis: Secondary | ICD-10-CM | POA: Diagnosis not present

## 2020-04-09 DIAGNOSIS — D509 Iron deficiency anemia, unspecified: Secondary | ICD-10-CM | POA: Diagnosis not present

## 2020-04-09 DIAGNOSIS — N2581 Secondary hyperparathyroidism of renal origin: Secondary | ICD-10-CM | POA: Diagnosis not present

## 2020-04-09 DIAGNOSIS — Z992 Dependence on renal dialysis: Secondary | ICD-10-CM | POA: Diagnosis not present

## 2020-04-09 DIAGNOSIS — N186 End stage renal disease: Secondary | ICD-10-CM | POA: Diagnosis not present

## 2020-04-09 DIAGNOSIS — D631 Anemia in chronic kidney disease: Secondary | ICD-10-CM | POA: Diagnosis not present

## 2020-04-11 DIAGNOSIS — D631 Anemia in chronic kidney disease: Secondary | ICD-10-CM | POA: Diagnosis not present

## 2020-04-11 DIAGNOSIS — N2581 Secondary hyperparathyroidism of renal origin: Secondary | ICD-10-CM | POA: Diagnosis not present

## 2020-04-11 DIAGNOSIS — N186 End stage renal disease: Secondary | ICD-10-CM | POA: Diagnosis not present

## 2020-04-11 DIAGNOSIS — D509 Iron deficiency anemia, unspecified: Secondary | ICD-10-CM | POA: Diagnosis not present

## 2020-04-11 DIAGNOSIS — Z992 Dependence on renal dialysis: Secondary | ICD-10-CM | POA: Diagnosis not present

## 2020-04-14 DIAGNOSIS — I129 Hypertensive chronic kidney disease with stage 1 through stage 4 chronic kidney disease, or unspecified chronic kidney disease: Secondary | ICD-10-CM | POA: Diagnosis not present

## 2020-04-14 DIAGNOSIS — Z992 Dependence on renal dialysis: Secondary | ICD-10-CM | POA: Diagnosis not present

## 2020-04-14 DIAGNOSIS — N186 End stage renal disease: Secondary | ICD-10-CM | POA: Diagnosis not present

## 2020-04-14 DIAGNOSIS — Z23 Encounter for immunization: Secondary | ICD-10-CM | POA: Diagnosis not present

## 2020-04-14 DIAGNOSIS — N2581 Secondary hyperparathyroidism of renal origin: Secondary | ICD-10-CM | POA: Diagnosis not present

## 2020-04-16 DIAGNOSIS — Z23 Encounter for immunization: Secondary | ICD-10-CM | POA: Diagnosis not present

## 2020-04-16 DIAGNOSIS — Z992 Dependence on renal dialysis: Secondary | ICD-10-CM | POA: Diagnosis not present

## 2020-04-16 DIAGNOSIS — N2581 Secondary hyperparathyroidism of renal origin: Secondary | ICD-10-CM | POA: Diagnosis not present

## 2020-04-16 DIAGNOSIS — N186 End stage renal disease: Secondary | ICD-10-CM | POA: Diagnosis not present

## 2020-04-18 DIAGNOSIS — Z992 Dependence on renal dialysis: Secondary | ICD-10-CM | POA: Diagnosis not present

## 2020-04-18 DIAGNOSIS — Z23 Encounter for immunization: Secondary | ICD-10-CM | POA: Diagnosis not present

## 2020-04-18 DIAGNOSIS — N2581 Secondary hyperparathyroidism of renal origin: Secondary | ICD-10-CM | POA: Diagnosis not present

## 2020-04-18 DIAGNOSIS — N186 End stage renal disease: Secondary | ICD-10-CM | POA: Diagnosis not present

## 2020-04-21 DIAGNOSIS — Z992 Dependence on renal dialysis: Secondary | ICD-10-CM | POA: Diagnosis not present

## 2020-04-21 DIAGNOSIS — N186 End stage renal disease: Secondary | ICD-10-CM | POA: Diagnosis not present

## 2020-04-21 DIAGNOSIS — Z23 Encounter for immunization: Secondary | ICD-10-CM | POA: Diagnosis not present

## 2020-04-21 DIAGNOSIS — N2581 Secondary hyperparathyroidism of renal origin: Secondary | ICD-10-CM | POA: Diagnosis not present

## 2020-04-23 DIAGNOSIS — N2581 Secondary hyperparathyroidism of renal origin: Secondary | ICD-10-CM | POA: Diagnosis not present

## 2020-04-23 DIAGNOSIS — Z23 Encounter for immunization: Secondary | ICD-10-CM | POA: Diagnosis not present

## 2020-04-23 DIAGNOSIS — N186 End stage renal disease: Secondary | ICD-10-CM | POA: Diagnosis not present

## 2020-04-23 DIAGNOSIS — Z992 Dependence on renal dialysis: Secondary | ICD-10-CM | POA: Diagnosis not present

## 2020-04-24 DIAGNOSIS — Z992 Dependence on renal dialysis: Secondary | ICD-10-CM | POA: Diagnosis not present

## 2020-04-24 DIAGNOSIS — N2581 Secondary hyperparathyroidism of renal origin: Secondary | ICD-10-CM | POA: Diagnosis not present

## 2020-04-24 DIAGNOSIS — N186 End stage renal disease: Secondary | ICD-10-CM | POA: Diagnosis not present

## 2020-04-24 DIAGNOSIS — Z23 Encounter for immunization: Secondary | ICD-10-CM | POA: Diagnosis not present

## 2020-04-28 DIAGNOSIS — N2581 Secondary hyperparathyroidism of renal origin: Secondary | ICD-10-CM | POA: Diagnosis not present

## 2020-04-28 DIAGNOSIS — Z23 Encounter for immunization: Secondary | ICD-10-CM | POA: Diagnosis not present

## 2020-04-28 DIAGNOSIS — Z992 Dependence on renal dialysis: Secondary | ICD-10-CM | POA: Diagnosis not present

## 2020-04-28 DIAGNOSIS — N186 End stage renal disease: Secondary | ICD-10-CM | POA: Diagnosis not present

## 2020-04-29 ENCOUNTER — Ambulatory Visit: Payer: Medicare Other | Admitting: Urology

## 2020-04-30 DIAGNOSIS — N2581 Secondary hyperparathyroidism of renal origin: Secondary | ICD-10-CM | POA: Diagnosis not present

## 2020-04-30 DIAGNOSIS — Z992 Dependence on renal dialysis: Secondary | ICD-10-CM | POA: Diagnosis not present

## 2020-04-30 DIAGNOSIS — Z23 Encounter for immunization: Secondary | ICD-10-CM | POA: Diagnosis not present

## 2020-04-30 DIAGNOSIS — N186 End stage renal disease: Secondary | ICD-10-CM | POA: Diagnosis not present

## 2020-05-01 ENCOUNTER — Other Ambulatory Visit: Payer: Self-pay | Admitting: *Deleted

## 2020-05-01 DIAGNOSIS — R972 Elevated prostate specific antigen [PSA]: Secondary | ICD-10-CM

## 2020-05-02 DIAGNOSIS — Z992 Dependence on renal dialysis: Secondary | ICD-10-CM | POA: Diagnosis not present

## 2020-05-02 DIAGNOSIS — N2581 Secondary hyperparathyroidism of renal origin: Secondary | ICD-10-CM | POA: Diagnosis not present

## 2020-05-02 DIAGNOSIS — N186 End stage renal disease: Secondary | ICD-10-CM | POA: Diagnosis not present

## 2020-05-02 DIAGNOSIS — Z23 Encounter for immunization: Secondary | ICD-10-CM | POA: Diagnosis not present

## 2020-05-05 ENCOUNTER — Other Ambulatory Visit: Payer: Self-pay

## 2020-05-05 ENCOUNTER — Other Ambulatory Visit: Payer: BC Managed Care – PPO

## 2020-05-05 DIAGNOSIS — N186 End stage renal disease: Secondary | ICD-10-CM | POA: Diagnosis not present

## 2020-05-05 DIAGNOSIS — Z23 Encounter for immunization: Secondary | ICD-10-CM | POA: Diagnosis not present

## 2020-05-05 DIAGNOSIS — R972 Elevated prostate specific antigen [PSA]: Secondary | ICD-10-CM | POA: Diagnosis not present

## 2020-05-05 DIAGNOSIS — Z992 Dependence on renal dialysis: Secondary | ICD-10-CM | POA: Diagnosis not present

## 2020-05-05 DIAGNOSIS — N2581 Secondary hyperparathyroidism of renal origin: Secondary | ICD-10-CM | POA: Diagnosis not present

## 2020-05-06 ENCOUNTER — Ambulatory Visit: Payer: Medicare Other | Admitting: Urology

## 2020-05-06 LAB — PSA: Prostate Specific Ag, Serum: 10.1 ng/mL — ABNORMAL HIGH (ref 0.0–4.0)

## 2020-05-07 DIAGNOSIS — N186 End stage renal disease: Secondary | ICD-10-CM | POA: Diagnosis not present

## 2020-05-07 DIAGNOSIS — Z23 Encounter for immunization: Secondary | ICD-10-CM | POA: Diagnosis not present

## 2020-05-07 DIAGNOSIS — Z992 Dependence on renal dialysis: Secondary | ICD-10-CM | POA: Diagnosis not present

## 2020-05-07 DIAGNOSIS — N2581 Secondary hyperparathyroidism of renal origin: Secondary | ICD-10-CM | POA: Diagnosis not present

## 2020-05-09 DIAGNOSIS — Z992 Dependence on renal dialysis: Secondary | ICD-10-CM | POA: Diagnosis not present

## 2020-05-09 DIAGNOSIS — N186 End stage renal disease: Secondary | ICD-10-CM | POA: Diagnosis not present

## 2020-05-09 DIAGNOSIS — N2581 Secondary hyperparathyroidism of renal origin: Secondary | ICD-10-CM | POA: Diagnosis not present

## 2020-05-09 DIAGNOSIS — Z23 Encounter for immunization: Secondary | ICD-10-CM | POA: Diagnosis not present

## 2020-05-12 DIAGNOSIS — Z992 Dependence on renal dialysis: Secondary | ICD-10-CM | POA: Diagnosis not present

## 2020-05-12 DIAGNOSIS — N186 End stage renal disease: Secondary | ICD-10-CM | POA: Diagnosis not present

## 2020-05-12 DIAGNOSIS — N2581 Secondary hyperparathyroidism of renal origin: Secondary | ICD-10-CM | POA: Diagnosis not present

## 2020-05-12 DIAGNOSIS — I129 Hypertensive chronic kidney disease with stage 1 through stage 4 chronic kidney disease, or unspecified chronic kidney disease: Secondary | ICD-10-CM | POA: Diagnosis not present

## 2020-05-14 ENCOUNTER — Ambulatory Visit: Payer: Medicare Other | Admitting: Urology

## 2020-05-14 DIAGNOSIS — N186 End stage renal disease: Secondary | ICD-10-CM | POA: Diagnosis not present

## 2020-05-14 DIAGNOSIS — Z992 Dependence on renal dialysis: Secondary | ICD-10-CM | POA: Diagnosis not present

## 2020-05-14 DIAGNOSIS — N2581 Secondary hyperparathyroidism of renal origin: Secondary | ICD-10-CM | POA: Diagnosis not present

## 2020-05-16 DIAGNOSIS — N186 End stage renal disease: Secondary | ICD-10-CM | POA: Diagnosis not present

## 2020-05-16 DIAGNOSIS — N2581 Secondary hyperparathyroidism of renal origin: Secondary | ICD-10-CM | POA: Diagnosis not present

## 2020-05-16 DIAGNOSIS — Z992 Dependence on renal dialysis: Secondary | ICD-10-CM | POA: Diagnosis not present

## 2020-05-19 DIAGNOSIS — Z992 Dependence on renal dialysis: Secondary | ICD-10-CM | POA: Diagnosis not present

## 2020-05-19 DIAGNOSIS — N186 End stage renal disease: Secondary | ICD-10-CM | POA: Diagnosis not present

## 2020-05-19 DIAGNOSIS — N2581 Secondary hyperparathyroidism of renal origin: Secondary | ICD-10-CM | POA: Diagnosis not present

## 2020-05-21 DIAGNOSIS — N186 End stage renal disease: Secondary | ICD-10-CM | POA: Diagnosis not present

## 2020-05-21 DIAGNOSIS — Z992 Dependence on renal dialysis: Secondary | ICD-10-CM | POA: Diagnosis not present

## 2020-05-21 DIAGNOSIS — N2581 Secondary hyperparathyroidism of renal origin: Secondary | ICD-10-CM | POA: Diagnosis not present

## 2020-05-23 DIAGNOSIS — Z992 Dependence on renal dialysis: Secondary | ICD-10-CM | POA: Diagnosis not present

## 2020-05-23 DIAGNOSIS — N2581 Secondary hyperparathyroidism of renal origin: Secondary | ICD-10-CM | POA: Diagnosis not present

## 2020-05-23 DIAGNOSIS — N186 End stage renal disease: Secondary | ICD-10-CM | POA: Diagnosis not present

## 2020-05-26 DIAGNOSIS — N2581 Secondary hyperparathyroidism of renal origin: Secondary | ICD-10-CM | POA: Diagnosis not present

## 2020-05-26 DIAGNOSIS — N186 End stage renal disease: Secondary | ICD-10-CM | POA: Diagnosis not present

## 2020-05-26 DIAGNOSIS — Z992 Dependence on renal dialysis: Secondary | ICD-10-CM | POA: Diagnosis not present

## 2020-05-27 DIAGNOSIS — I871 Compression of vein: Secondary | ICD-10-CM | POA: Diagnosis not present

## 2020-05-27 DIAGNOSIS — Z992 Dependence on renal dialysis: Secondary | ICD-10-CM | POA: Diagnosis not present

## 2020-05-27 DIAGNOSIS — T82858A Stenosis of vascular prosthetic devices, implants and grafts, initial encounter: Secondary | ICD-10-CM | POA: Diagnosis not present

## 2020-05-27 DIAGNOSIS — N186 End stage renal disease: Secondary | ICD-10-CM | POA: Diagnosis not present

## 2020-05-28 DIAGNOSIS — Z992 Dependence on renal dialysis: Secondary | ICD-10-CM | POA: Diagnosis not present

## 2020-05-28 DIAGNOSIS — N186 End stage renal disease: Secondary | ICD-10-CM | POA: Diagnosis not present

## 2020-05-28 DIAGNOSIS — N2581 Secondary hyperparathyroidism of renal origin: Secondary | ICD-10-CM | POA: Diagnosis not present

## 2020-05-30 DIAGNOSIS — N186 End stage renal disease: Secondary | ICD-10-CM | POA: Diagnosis not present

## 2020-05-30 DIAGNOSIS — N2581 Secondary hyperparathyroidism of renal origin: Secondary | ICD-10-CM | POA: Diagnosis not present

## 2020-05-30 DIAGNOSIS — Z992 Dependence on renal dialysis: Secondary | ICD-10-CM | POA: Diagnosis not present

## 2020-06-02 DIAGNOSIS — N186 End stage renal disease: Secondary | ICD-10-CM | POA: Diagnosis not present

## 2020-06-02 DIAGNOSIS — Z992 Dependence on renal dialysis: Secondary | ICD-10-CM | POA: Diagnosis not present

## 2020-06-02 DIAGNOSIS — N2581 Secondary hyperparathyroidism of renal origin: Secondary | ICD-10-CM | POA: Diagnosis not present

## 2020-06-04 DIAGNOSIS — N2581 Secondary hyperparathyroidism of renal origin: Secondary | ICD-10-CM | POA: Diagnosis not present

## 2020-06-04 DIAGNOSIS — N186 End stage renal disease: Secondary | ICD-10-CM | POA: Diagnosis not present

## 2020-06-04 DIAGNOSIS — Z992 Dependence on renal dialysis: Secondary | ICD-10-CM | POA: Diagnosis not present

## 2020-06-06 DIAGNOSIS — Z992 Dependence on renal dialysis: Secondary | ICD-10-CM | POA: Diagnosis not present

## 2020-06-06 DIAGNOSIS — N186 End stage renal disease: Secondary | ICD-10-CM | POA: Diagnosis not present

## 2020-06-06 DIAGNOSIS — N2581 Secondary hyperparathyroidism of renal origin: Secondary | ICD-10-CM | POA: Diagnosis not present

## 2020-06-09 DIAGNOSIS — N2581 Secondary hyperparathyroidism of renal origin: Secondary | ICD-10-CM | POA: Diagnosis not present

## 2020-06-09 DIAGNOSIS — N186 End stage renal disease: Secondary | ICD-10-CM | POA: Diagnosis not present

## 2020-06-09 DIAGNOSIS — Z992 Dependence on renal dialysis: Secondary | ICD-10-CM | POA: Diagnosis not present

## 2020-06-11 DIAGNOSIS — N186 End stage renal disease: Secondary | ICD-10-CM | POA: Diagnosis not present

## 2020-06-11 DIAGNOSIS — N2581 Secondary hyperparathyroidism of renal origin: Secondary | ICD-10-CM | POA: Diagnosis not present

## 2020-06-11 DIAGNOSIS — Z992 Dependence on renal dialysis: Secondary | ICD-10-CM | POA: Diagnosis not present

## 2020-06-12 DIAGNOSIS — Z992 Dependence on renal dialysis: Secondary | ICD-10-CM | POA: Diagnosis not present

## 2020-06-12 DIAGNOSIS — I129 Hypertensive chronic kidney disease with stage 1 through stage 4 chronic kidney disease, or unspecified chronic kidney disease: Secondary | ICD-10-CM | POA: Diagnosis not present

## 2020-06-12 DIAGNOSIS — N186 End stage renal disease: Secondary | ICD-10-CM | POA: Diagnosis not present

## 2020-06-13 DIAGNOSIS — Z992 Dependence on renal dialysis: Secondary | ICD-10-CM | POA: Diagnosis not present

## 2020-06-13 DIAGNOSIS — N186 End stage renal disease: Secondary | ICD-10-CM | POA: Diagnosis not present

## 2020-06-13 DIAGNOSIS — N2581 Secondary hyperparathyroidism of renal origin: Secondary | ICD-10-CM | POA: Diagnosis not present

## 2020-06-16 DIAGNOSIS — Z992 Dependence on renal dialysis: Secondary | ICD-10-CM | POA: Diagnosis not present

## 2020-06-16 DIAGNOSIS — N2581 Secondary hyperparathyroidism of renal origin: Secondary | ICD-10-CM | POA: Diagnosis not present

## 2020-06-16 DIAGNOSIS — N186 End stage renal disease: Secondary | ICD-10-CM | POA: Diagnosis not present

## 2020-06-17 ENCOUNTER — Other Ambulatory Visit: Payer: Self-pay

## 2020-06-17 ENCOUNTER — Ambulatory Visit (INDEPENDENT_AMBULATORY_CARE_PROVIDER_SITE_OTHER): Payer: Medicare Other | Admitting: Podiatry

## 2020-06-17 ENCOUNTER — Encounter: Payer: Self-pay | Admitting: Podiatry

## 2020-06-17 DIAGNOSIS — M2042 Other hammer toe(s) (acquired), left foot: Secondary | ICD-10-CM

## 2020-06-17 DIAGNOSIS — L84 Corns and callosities: Secondary | ICD-10-CM | POA: Diagnosis not present

## 2020-06-17 DIAGNOSIS — M79675 Pain in left toe(s): Secondary | ICD-10-CM | POA: Diagnosis not present

## 2020-06-17 DIAGNOSIS — M2011 Hallux valgus (acquired), right foot: Secondary | ICD-10-CM

## 2020-06-17 DIAGNOSIS — D631 Anemia in chronic kidney disease: Secondary | ICD-10-CM | POA: Diagnosis not present

## 2020-06-17 DIAGNOSIS — Z992 Dependence on renal dialysis: Secondary | ICD-10-CM

## 2020-06-17 DIAGNOSIS — B351 Tinea unguium: Secondary | ICD-10-CM

## 2020-06-17 DIAGNOSIS — N186 End stage renal disease: Secondary | ICD-10-CM

## 2020-06-17 DIAGNOSIS — M2012 Hallux valgus (acquired), left foot: Secondary | ICD-10-CM

## 2020-06-17 DIAGNOSIS — M79674 Pain in right toe(s): Secondary | ICD-10-CM

## 2020-06-17 DIAGNOSIS — M2041 Other hammer toe(s) (acquired), right foot: Secondary | ICD-10-CM

## 2020-06-17 DIAGNOSIS — N189 Chronic kidney disease, unspecified: Secondary | ICD-10-CM | POA: Diagnosis not present

## 2020-06-18 DIAGNOSIS — N2581 Secondary hyperparathyroidism of renal origin: Secondary | ICD-10-CM | POA: Diagnosis not present

## 2020-06-18 DIAGNOSIS — Z992 Dependence on renal dialysis: Secondary | ICD-10-CM | POA: Diagnosis not present

## 2020-06-18 DIAGNOSIS — N186 End stage renal disease: Secondary | ICD-10-CM | POA: Diagnosis not present

## 2020-06-20 DIAGNOSIS — Z992 Dependence on renal dialysis: Secondary | ICD-10-CM | POA: Diagnosis not present

## 2020-06-20 DIAGNOSIS — N186 End stage renal disease: Secondary | ICD-10-CM | POA: Diagnosis not present

## 2020-06-20 DIAGNOSIS — N2581 Secondary hyperparathyroidism of renal origin: Secondary | ICD-10-CM | POA: Diagnosis not present

## 2020-06-21 NOTE — Progress Notes (Signed)
Subjective: Dwayne Reed is a 65 y.o. male patient seen today at risk foot care. Patient has h/o ESRD on hemodialysis and callus(es) b/l feet and painful mycotic toenails b/l that are difficult to trim. Pain interferes with ambulation. Aggravating factors include wearing enclosed shoe gear. Pain is relieved with periodic professional debridement.   His PCP is Clarene Reamer, FNP. Last visit was: 09/27/2019. He also sees Dr. Madelon Lips, and last visit was 02/08/2020.  He voices no new pedal concerns on today's visit.  Allergies  Allergen Reactions  . Sensipar [Cinacalcet]     Other reaction(s): Unknown    Objective: Physical Exam  General: Dwayne Reed is a pleasant 65 y.o. y.o. AA male, WD, WN in NAD. AAO x 3.   Vascular:  Neurovascular status unchanged b/l. Capillary fill time to digits <3 seconds b/l. Palpable DP pulses b/l. Palpable PT pulses b/l. Pedal hair absent b/l Skin temperature gradient within normal limits b/l. No edema noted b/l. No pain with calf compression b/l.  Dermatological:  Pedal skin with normal turgor, texture and tone bilaterally. No open wounds bilaterally. No interdigital macerations bilaterally. Toenails 1-5 b/l elongated, dystrophic, thickened, crumbly with subungual debris and tenderness to dorsal palpation. Hyperkeratotic lesion(s) submet head 1 left foot, submet head 2 left foot, submet head 2 right foot and submet head 5 left foot.  No erythema, no edema, no drainage, no flocculence.  Musculoskeletal:  Normal muscle strength 5/5 to all lower extremity muscle groups bilaterally. No pain crepitus or joint limitation noted with ROM b/l. Hallux valgus with bunion deformity noted b/l. Hammertoes noted to the 2-5 bilaterally.  Neurological:  Protective sensation intact 5/5 intact bilaterally with 10g monofilament b/l. Vibratory sensation intact b/l. Proprioception intact bilaterally.  Assessment and Plan:  1. Pain due to onychomycosis of toenails of both  feet   2. Callus   3. Hallux valgus, acquired, bilateral   4. Hammertoes of both feet   5. ESRD on dialysis ([place])   6. Anemia of renal disease    -Examined patient. -No new findings. No new orders. -Toenails 1-5 b/l were debrided in length and girth with sterile nail nippers and dremel without iatrogenic bleeding.  -Callus(es) submet head 1 left foot, submet head 2 left foot, submet head 2 right foot and submet head 5 left foot pared utilizing sterile scalpel blade without complication or incident. Total number debrided =4. -Patient to continue soft, supportive shoe gear daily. -Patient to report any pedal injuries to medical professional immediately. -Patient/POA to call should there be question/concern in the interim.  Return in about 3 months (around 09/16/2020) for nail and callus trim.  [name], DPM

## 2020-06-23 DIAGNOSIS — Z992 Dependence on renal dialysis: Secondary | ICD-10-CM | POA: Diagnosis not present

## 2020-06-23 DIAGNOSIS — N186 End stage renal disease: Secondary | ICD-10-CM | POA: Diagnosis not present

## 2020-06-23 DIAGNOSIS — N2581 Secondary hyperparathyroidism of renal origin: Secondary | ICD-10-CM | POA: Diagnosis not present

## 2020-06-25 DIAGNOSIS — N186 End stage renal disease: Secondary | ICD-10-CM | POA: Diagnosis not present

## 2020-06-25 DIAGNOSIS — N2581 Secondary hyperparathyroidism of renal origin: Secondary | ICD-10-CM | POA: Diagnosis not present

## 2020-06-25 DIAGNOSIS — Z992 Dependence on renal dialysis: Secondary | ICD-10-CM | POA: Diagnosis not present

## 2020-06-27 DIAGNOSIS — N186 End stage renal disease: Secondary | ICD-10-CM | POA: Diagnosis not present

## 2020-06-27 DIAGNOSIS — Z992 Dependence on renal dialysis: Secondary | ICD-10-CM | POA: Diagnosis not present

## 2020-06-27 DIAGNOSIS — N2581 Secondary hyperparathyroidism of renal origin: Secondary | ICD-10-CM | POA: Diagnosis not present

## 2020-06-30 DIAGNOSIS — N186 End stage renal disease: Secondary | ICD-10-CM | POA: Diagnosis not present

## 2020-06-30 DIAGNOSIS — Z992 Dependence on renal dialysis: Secondary | ICD-10-CM | POA: Diagnosis not present

## 2020-06-30 DIAGNOSIS — N2581 Secondary hyperparathyroidism of renal origin: Secondary | ICD-10-CM | POA: Diagnosis not present

## 2020-07-02 DIAGNOSIS — N186 End stage renal disease: Secondary | ICD-10-CM | POA: Diagnosis not present

## 2020-07-02 DIAGNOSIS — Z992 Dependence on renal dialysis: Secondary | ICD-10-CM | POA: Diagnosis not present

## 2020-07-02 DIAGNOSIS — N2581 Secondary hyperparathyroidism of renal origin: Secondary | ICD-10-CM | POA: Diagnosis not present

## 2020-07-04 DIAGNOSIS — N186 End stage renal disease: Secondary | ICD-10-CM | POA: Diagnosis not present

## 2020-07-04 DIAGNOSIS — N2581 Secondary hyperparathyroidism of renal origin: Secondary | ICD-10-CM | POA: Diagnosis not present

## 2020-07-04 DIAGNOSIS — Z992 Dependence on renal dialysis: Secondary | ICD-10-CM | POA: Diagnosis not present

## 2020-07-07 DIAGNOSIS — N186 End stage renal disease: Secondary | ICD-10-CM | POA: Diagnosis not present

## 2020-07-07 DIAGNOSIS — Z992 Dependence on renal dialysis: Secondary | ICD-10-CM | POA: Diagnosis not present

## 2020-07-07 DIAGNOSIS — N2581 Secondary hyperparathyroidism of renal origin: Secondary | ICD-10-CM | POA: Diagnosis not present

## 2020-07-09 DIAGNOSIS — N186 End stage renal disease: Secondary | ICD-10-CM | POA: Diagnosis not present

## 2020-07-09 DIAGNOSIS — N2581 Secondary hyperparathyroidism of renal origin: Secondary | ICD-10-CM | POA: Diagnosis not present

## 2020-07-09 DIAGNOSIS — Z992 Dependence on renal dialysis: Secondary | ICD-10-CM | POA: Diagnosis not present

## 2020-07-11 DIAGNOSIS — Z992 Dependence on renal dialysis: Secondary | ICD-10-CM | POA: Diagnosis not present

## 2020-07-11 DIAGNOSIS — N2581 Secondary hyperparathyroidism of renal origin: Secondary | ICD-10-CM | POA: Diagnosis not present

## 2020-07-11 DIAGNOSIS — N186 End stage renal disease: Secondary | ICD-10-CM | POA: Diagnosis not present

## 2020-07-12 DIAGNOSIS — I129 Hypertensive chronic kidney disease with stage 1 through stage 4 chronic kidney disease, or unspecified chronic kidney disease: Secondary | ICD-10-CM | POA: Diagnosis not present

## 2020-07-12 DIAGNOSIS — N186 End stage renal disease: Secondary | ICD-10-CM | POA: Diagnosis not present

## 2020-07-12 DIAGNOSIS — Z992 Dependence on renal dialysis: Secondary | ICD-10-CM | POA: Diagnosis not present

## 2020-07-14 DIAGNOSIS — N2581 Secondary hyperparathyroidism of renal origin: Secondary | ICD-10-CM | POA: Diagnosis not present

## 2020-07-14 DIAGNOSIS — D631 Anemia in chronic kidney disease: Secondary | ICD-10-CM | POA: Diagnosis not present

## 2020-07-14 DIAGNOSIS — Z992 Dependence on renal dialysis: Secondary | ICD-10-CM | POA: Diagnosis not present

## 2020-07-14 DIAGNOSIS — N186 End stage renal disease: Secondary | ICD-10-CM | POA: Diagnosis not present

## 2020-07-16 DIAGNOSIS — N2581 Secondary hyperparathyroidism of renal origin: Secondary | ICD-10-CM | POA: Diagnosis not present

## 2020-07-16 DIAGNOSIS — D631 Anemia in chronic kidney disease: Secondary | ICD-10-CM | POA: Diagnosis not present

## 2020-07-16 DIAGNOSIS — Z992 Dependence on renal dialysis: Secondary | ICD-10-CM | POA: Diagnosis not present

## 2020-07-16 DIAGNOSIS — N186 End stage renal disease: Secondary | ICD-10-CM | POA: Diagnosis not present

## 2020-07-18 DIAGNOSIS — D631 Anemia in chronic kidney disease: Secondary | ICD-10-CM | POA: Diagnosis not present

## 2020-07-18 DIAGNOSIS — N186 End stage renal disease: Secondary | ICD-10-CM | POA: Diagnosis not present

## 2020-07-18 DIAGNOSIS — Z992 Dependence on renal dialysis: Secondary | ICD-10-CM | POA: Diagnosis not present

## 2020-07-18 DIAGNOSIS — N2581 Secondary hyperparathyroidism of renal origin: Secondary | ICD-10-CM | POA: Diagnosis not present

## 2020-07-21 DIAGNOSIS — Z992 Dependence on renal dialysis: Secondary | ICD-10-CM | POA: Diagnosis not present

## 2020-07-21 DIAGNOSIS — N2581 Secondary hyperparathyroidism of renal origin: Secondary | ICD-10-CM | POA: Diagnosis not present

## 2020-07-21 DIAGNOSIS — N186 End stage renal disease: Secondary | ICD-10-CM | POA: Diagnosis not present

## 2020-07-21 DIAGNOSIS — D631 Anemia in chronic kidney disease: Secondary | ICD-10-CM | POA: Diagnosis not present

## 2020-07-23 DIAGNOSIS — N2581 Secondary hyperparathyroidism of renal origin: Secondary | ICD-10-CM | POA: Diagnosis not present

## 2020-07-23 DIAGNOSIS — N186 End stage renal disease: Secondary | ICD-10-CM | POA: Diagnosis not present

## 2020-07-23 DIAGNOSIS — Z992 Dependence on renal dialysis: Secondary | ICD-10-CM | POA: Diagnosis not present

## 2020-07-23 DIAGNOSIS — D631 Anemia in chronic kidney disease: Secondary | ICD-10-CM | POA: Diagnosis not present

## 2020-07-25 DIAGNOSIS — D631 Anemia in chronic kidney disease: Secondary | ICD-10-CM | POA: Diagnosis not present

## 2020-07-25 DIAGNOSIS — N2581 Secondary hyperparathyroidism of renal origin: Secondary | ICD-10-CM | POA: Diagnosis not present

## 2020-07-25 DIAGNOSIS — N186 End stage renal disease: Secondary | ICD-10-CM | POA: Diagnosis not present

## 2020-07-25 DIAGNOSIS — Z992 Dependence on renal dialysis: Secondary | ICD-10-CM | POA: Diagnosis not present

## 2020-07-28 DIAGNOSIS — N186 End stage renal disease: Secondary | ICD-10-CM | POA: Diagnosis not present

## 2020-07-28 DIAGNOSIS — D631 Anemia in chronic kidney disease: Secondary | ICD-10-CM | POA: Diagnosis not present

## 2020-07-28 DIAGNOSIS — N2581 Secondary hyperparathyroidism of renal origin: Secondary | ICD-10-CM | POA: Diagnosis not present

## 2020-07-28 DIAGNOSIS — Z992 Dependence on renal dialysis: Secondary | ICD-10-CM | POA: Diagnosis not present

## 2020-07-30 DIAGNOSIS — Z992 Dependence on renal dialysis: Secondary | ICD-10-CM | POA: Diagnosis not present

## 2020-07-30 DIAGNOSIS — D631 Anemia in chronic kidney disease: Secondary | ICD-10-CM | POA: Diagnosis not present

## 2020-07-30 DIAGNOSIS — N2581 Secondary hyperparathyroidism of renal origin: Secondary | ICD-10-CM | POA: Diagnosis not present

## 2020-07-30 DIAGNOSIS — N186 End stage renal disease: Secondary | ICD-10-CM | POA: Diagnosis not present

## 2020-08-01 DIAGNOSIS — Z992 Dependence on renal dialysis: Secondary | ICD-10-CM | POA: Diagnosis not present

## 2020-08-01 DIAGNOSIS — N186 End stage renal disease: Secondary | ICD-10-CM | POA: Diagnosis not present

## 2020-08-01 DIAGNOSIS — D631 Anemia in chronic kidney disease: Secondary | ICD-10-CM | POA: Diagnosis not present

## 2020-08-01 DIAGNOSIS — N2581 Secondary hyperparathyroidism of renal origin: Secondary | ICD-10-CM | POA: Diagnosis not present

## 2020-08-04 DIAGNOSIS — N2581 Secondary hyperparathyroidism of renal origin: Secondary | ICD-10-CM | POA: Diagnosis not present

## 2020-08-04 DIAGNOSIS — Z992 Dependence on renal dialysis: Secondary | ICD-10-CM | POA: Diagnosis not present

## 2020-08-04 DIAGNOSIS — N186 End stage renal disease: Secondary | ICD-10-CM | POA: Diagnosis not present

## 2020-08-04 DIAGNOSIS — D631 Anemia in chronic kidney disease: Secondary | ICD-10-CM | POA: Diagnosis not present

## 2020-08-06 DIAGNOSIS — N2581 Secondary hyperparathyroidism of renal origin: Secondary | ICD-10-CM | POA: Diagnosis not present

## 2020-08-06 DIAGNOSIS — Z992 Dependence on renal dialysis: Secondary | ICD-10-CM | POA: Diagnosis not present

## 2020-08-06 DIAGNOSIS — N186 End stage renal disease: Secondary | ICD-10-CM | POA: Diagnosis not present

## 2020-08-06 DIAGNOSIS — D631 Anemia in chronic kidney disease: Secondary | ICD-10-CM | POA: Diagnosis not present

## 2020-08-08 DIAGNOSIS — D631 Anemia in chronic kidney disease: Secondary | ICD-10-CM | POA: Diagnosis not present

## 2020-08-08 DIAGNOSIS — N186 End stage renal disease: Secondary | ICD-10-CM | POA: Diagnosis not present

## 2020-08-08 DIAGNOSIS — N2581 Secondary hyperparathyroidism of renal origin: Secondary | ICD-10-CM | POA: Diagnosis not present

## 2020-08-08 DIAGNOSIS — Z992 Dependence on renal dialysis: Secondary | ICD-10-CM | POA: Diagnosis not present

## 2020-08-11 DIAGNOSIS — N186 End stage renal disease: Secondary | ICD-10-CM | POA: Diagnosis not present

## 2020-08-11 DIAGNOSIS — N2581 Secondary hyperparathyroidism of renal origin: Secondary | ICD-10-CM | POA: Diagnosis not present

## 2020-08-11 DIAGNOSIS — D631 Anemia in chronic kidney disease: Secondary | ICD-10-CM | POA: Diagnosis not present

## 2020-08-11 DIAGNOSIS — Z992 Dependence on renal dialysis: Secondary | ICD-10-CM | POA: Diagnosis not present

## 2020-08-12 DIAGNOSIS — Z992 Dependence on renal dialysis: Secondary | ICD-10-CM | POA: Diagnosis not present

## 2020-08-12 DIAGNOSIS — N186 End stage renal disease: Secondary | ICD-10-CM | POA: Diagnosis not present

## 2020-08-12 DIAGNOSIS — I129 Hypertensive chronic kidney disease with stage 1 through stage 4 chronic kidney disease, or unspecified chronic kidney disease: Secondary | ICD-10-CM | POA: Diagnosis not present

## 2020-08-13 DIAGNOSIS — N2581 Secondary hyperparathyroidism of renal origin: Secondary | ICD-10-CM | POA: Diagnosis not present

## 2020-08-13 DIAGNOSIS — Z992 Dependence on renal dialysis: Secondary | ICD-10-CM | POA: Diagnosis not present

## 2020-08-13 DIAGNOSIS — N186 End stage renal disease: Secondary | ICD-10-CM | POA: Diagnosis not present

## 2020-08-15 DIAGNOSIS — N186 End stage renal disease: Secondary | ICD-10-CM | POA: Diagnosis not present

## 2020-08-15 DIAGNOSIS — N2581 Secondary hyperparathyroidism of renal origin: Secondary | ICD-10-CM | POA: Diagnosis not present

## 2020-08-15 DIAGNOSIS — Z992 Dependence on renal dialysis: Secondary | ICD-10-CM | POA: Diagnosis not present

## 2020-08-18 DIAGNOSIS — Z992 Dependence on renal dialysis: Secondary | ICD-10-CM | POA: Diagnosis not present

## 2020-08-18 DIAGNOSIS — N186 End stage renal disease: Secondary | ICD-10-CM | POA: Diagnosis not present

## 2020-08-18 DIAGNOSIS — N2581 Secondary hyperparathyroidism of renal origin: Secondary | ICD-10-CM | POA: Diagnosis not present

## 2020-08-20 DIAGNOSIS — N186 End stage renal disease: Secondary | ICD-10-CM | POA: Diagnosis not present

## 2020-08-20 DIAGNOSIS — N2581 Secondary hyperparathyroidism of renal origin: Secondary | ICD-10-CM | POA: Diagnosis not present

## 2020-08-20 DIAGNOSIS — Z992 Dependence on renal dialysis: Secondary | ICD-10-CM | POA: Diagnosis not present

## 2020-08-22 DIAGNOSIS — N186 End stage renal disease: Secondary | ICD-10-CM | POA: Diagnosis not present

## 2020-08-22 DIAGNOSIS — N2581 Secondary hyperparathyroidism of renal origin: Secondary | ICD-10-CM | POA: Diagnosis not present

## 2020-08-22 DIAGNOSIS — Z992 Dependence on renal dialysis: Secondary | ICD-10-CM | POA: Diagnosis not present

## 2020-08-25 DIAGNOSIS — N2581 Secondary hyperparathyroidism of renal origin: Secondary | ICD-10-CM | POA: Diagnosis not present

## 2020-08-25 DIAGNOSIS — N186 End stage renal disease: Secondary | ICD-10-CM | POA: Diagnosis not present

## 2020-08-25 DIAGNOSIS — Z992 Dependence on renal dialysis: Secondary | ICD-10-CM | POA: Diagnosis not present

## 2020-08-27 DIAGNOSIS — N186 End stage renal disease: Secondary | ICD-10-CM | POA: Diagnosis not present

## 2020-08-27 DIAGNOSIS — Z992 Dependence on renal dialysis: Secondary | ICD-10-CM | POA: Diagnosis not present

## 2020-08-27 DIAGNOSIS — N2581 Secondary hyperparathyroidism of renal origin: Secondary | ICD-10-CM | POA: Diagnosis not present

## 2020-08-29 DIAGNOSIS — N186 End stage renal disease: Secondary | ICD-10-CM | POA: Diagnosis not present

## 2020-08-29 DIAGNOSIS — Z992 Dependence on renal dialysis: Secondary | ICD-10-CM | POA: Diagnosis not present

## 2020-08-29 DIAGNOSIS — N2581 Secondary hyperparathyroidism of renal origin: Secondary | ICD-10-CM | POA: Diagnosis not present

## 2020-09-01 DIAGNOSIS — N186 End stage renal disease: Secondary | ICD-10-CM | POA: Diagnosis not present

## 2020-09-01 DIAGNOSIS — Z992 Dependence on renal dialysis: Secondary | ICD-10-CM | POA: Diagnosis not present

## 2020-09-01 DIAGNOSIS — N2581 Secondary hyperparathyroidism of renal origin: Secondary | ICD-10-CM | POA: Diagnosis not present

## 2020-09-03 DIAGNOSIS — N186 End stage renal disease: Secondary | ICD-10-CM | POA: Diagnosis not present

## 2020-09-03 DIAGNOSIS — N2581 Secondary hyperparathyroidism of renal origin: Secondary | ICD-10-CM | POA: Diagnosis not present

## 2020-09-03 DIAGNOSIS — Z992 Dependence on renal dialysis: Secondary | ICD-10-CM | POA: Diagnosis not present

## 2020-09-05 DIAGNOSIS — N186 End stage renal disease: Secondary | ICD-10-CM | POA: Diagnosis not present

## 2020-09-05 DIAGNOSIS — N2581 Secondary hyperparathyroidism of renal origin: Secondary | ICD-10-CM | POA: Diagnosis not present

## 2020-09-05 DIAGNOSIS — Z992 Dependence on renal dialysis: Secondary | ICD-10-CM | POA: Diagnosis not present

## 2020-09-08 DIAGNOSIS — N186 End stage renal disease: Secondary | ICD-10-CM | POA: Diagnosis not present

## 2020-09-08 DIAGNOSIS — N2581 Secondary hyperparathyroidism of renal origin: Secondary | ICD-10-CM | POA: Diagnosis not present

## 2020-09-08 DIAGNOSIS — Z992 Dependence on renal dialysis: Secondary | ICD-10-CM | POA: Diagnosis not present

## 2020-09-10 DIAGNOSIS — N2581 Secondary hyperparathyroidism of renal origin: Secondary | ICD-10-CM | POA: Diagnosis not present

## 2020-09-10 DIAGNOSIS — N186 End stage renal disease: Secondary | ICD-10-CM | POA: Diagnosis not present

## 2020-09-10 DIAGNOSIS — Z992 Dependence on renal dialysis: Secondary | ICD-10-CM | POA: Diagnosis not present

## 2020-09-11 DIAGNOSIS — Z992 Dependence on renal dialysis: Secondary | ICD-10-CM | POA: Diagnosis not present

## 2020-09-11 DIAGNOSIS — N186 End stage renal disease: Secondary | ICD-10-CM | POA: Diagnosis not present

## 2020-09-11 DIAGNOSIS — I129 Hypertensive chronic kidney disease with stage 1 through stage 4 chronic kidney disease, or unspecified chronic kidney disease: Secondary | ICD-10-CM | POA: Diagnosis not present

## 2020-09-12 DIAGNOSIS — Z992 Dependence on renal dialysis: Secondary | ICD-10-CM | POA: Diagnosis not present

## 2020-09-12 DIAGNOSIS — N2581 Secondary hyperparathyroidism of renal origin: Secondary | ICD-10-CM | POA: Diagnosis not present

## 2020-09-12 DIAGNOSIS — N186 End stage renal disease: Secondary | ICD-10-CM | POA: Diagnosis not present

## 2020-09-15 DIAGNOSIS — Z992 Dependence on renal dialysis: Secondary | ICD-10-CM | POA: Diagnosis not present

## 2020-09-15 DIAGNOSIS — N186 End stage renal disease: Secondary | ICD-10-CM | POA: Diagnosis not present

## 2020-09-15 DIAGNOSIS — N2581 Secondary hyperparathyroidism of renal origin: Secondary | ICD-10-CM | POA: Diagnosis not present

## 2020-09-17 DIAGNOSIS — Z992 Dependence on renal dialysis: Secondary | ICD-10-CM | POA: Diagnosis not present

## 2020-09-17 DIAGNOSIS — N2581 Secondary hyperparathyroidism of renal origin: Secondary | ICD-10-CM | POA: Diagnosis not present

## 2020-09-17 DIAGNOSIS — N186 End stage renal disease: Secondary | ICD-10-CM | POA: Diagnosis not present

## 2020-09-19 DIAGNOSIS — Z992 Dependence on renal dialysis: Secondary | ICD-10-CM | POA: Diagnosis not present

## 2020-09-19 DIAGNOSIS — N2581 Secondary hyperparathyroidism of renal origin: Secondary | ICD-10-CM | POA: Diagnosis not present

## 2020-09-19 DIAGNOSIS — N186 End stage renal disease: Secondary | ICD-10-CM | POA: Diagnosis not present

## 2020-09-22 DIAGNOSIS — N186 End stage renal disease: Secondary | ICD-10-CM | POA: Diagnosis not present

## 2020-09-22 DIAGNOSIS — N2581 Secondary hyperparathyroidism of renal origin: Secondary | ICD-10-CM | POA: Diagnosis not present

## 2020-09-22 DIAGNOSIS — Z992 Dependence on renal dialysis: Secondary | ICD-10-CM | POA: Diagnosis not present

## 2020-09-24 DIAGNOSIS — N186 End stage renal disease: Secondary | ICD-10-CM | POA: Diagnosis not present

## 2020-09-24 DIAGNOSIS — N2581 Secondary hyperparathyroidism of renal origin: Secondary | ICD-10-CM | POA: Diagnosis not present

## 2020-09-24 DIAGNOSIS — Z992 Dependence on renal dialysis: Secondary | ICD-10-CM | POA: Diagnosis not present

## 2020-09-26 DIAGNOSIS — Z992 Dependence on renal dialysis: Secondary | ICD-10-CM | POA: Diagnosis not present

## 2020-09-26 DIAGNOSIS — N186 End stage renal disease: Secondary | ICD-10-CM | POA: Diagnosis not present

## 2020-09-26 DIAGNOSIS — N2581 Secondary hyperparathyroidism of renal origin: Secondary | ICD-10-CM | POA: Diagnosis not present

## 2020-09-29 DIAGNOSIS — Z992 Dependence on renal dialysis: Secondary | ICD-10-CM | POA: Diagnosis not present

## 2020-09-29 DIAGNOSIS — N186 End stage renal disease: Secondary | ICD-10-CM | POA: Diagnosis not present

## 2020-09-29 DIAGNOSIS — N2581 Secondary hyperparathyroidism of renal origin: Secondary | ICD-10-CM | POA: Diagnosis not present

## 2020-10-02 ENCOUNTER — Ambulatory Visit: Payer: Medicare Other | Admitting: Podiatry

## 2020-10-02 DIAGNOSIS — N2581 Secondary hyperparathyroidism of renal origin: Secondary | ICD-10-CM | POA: Diagnosis not present

## 2020-10-02 DIAGNOSIS — N186 End stage renal disease: Secondary | ICD-10-CM | POA: Diagnosis not present

## 2020-10-02 DIAGNOSIS — Z992 Dependence on renal dialysis: Secondary | ICD-10-CM | POA: Diagnosis not present

## 2020-10-03 DIAGNOSIS — N2581 Secondary hyperparathyroidism of renal origin: Secondary | ICD-10-CM | POA: Diagnosis not present

## 2020-10-03 DIAGNOSIS — N186 End stage renal disease: Secondary | ICD-10-CM | POA: Diagnosis not present

## 2020-10-03 DIAGNOSIS — Z992 Dependence on renal dialysis: Secondary | ICD-10-CM | POA: Diagnosis not present

## 2020-10-06 DIAGNOSIS — Z992 Dependence on renal dialysis: Secondary | ICD-10-CM | POA: Diagnosis not present

## 2020-10-06 DIAGNOSIS — N2581 Secondary hyperparathyroidism of renal origin: Secondary | ICD-10-CM | POA: Diagnosis not present

## 2020-10-06 DIAGNOSIS — N186 End stage renal disease: Secondary | ICD-10-CM | POA: Diagnosis not present

## 2020-10-08 DIAGNOSIS — N186 End stage renal disease: Secondary | ICD-10-CM | POA: Diagnosis not present

## 2020-10-08 DIAGNOSIS — N2581 Secondary hyperparathyroidism of renal origin: Secondary | ICD-10-CM | POA: Diagnosis not present

## 2020-10-08 DIAGNOSIS — Z992 Dependence on renal dialysis: Secondary | ICD-10-CM | POA: Diagnosis not present

## 2020-10-10 DIAGNOSIS — N2581 Secondary hyperparathyroidism of renal origin: Secondary | ICD-10-CM | POA: Diagnosis not present

## 2020-10-10 DIAGNOSIS — Z992 Dependence on renal dialysis: Secondary | ICD-10-CM | POA: Diagnosis not present

## 2020-10-10 DIAGNOSIS — N186 End stage renal disease: Secondary | ICD-10-CM | POA: Diagnosis not present

## 2020-10-12 DIAGNOSIS — Z992 Dependence on renal dialysis: Secondary | ICD-10-CM | POA: Diagnosis not present

## 2020-10-12 DIAGNOSIS — I129 Hypertensive chronic kidney disease with stage 1 through stage 4 chronic kidney disease, or unspecified chronic kidney disease: Secondary | ICD-10-CM | POA: Diagnosis not present

## 2020-10-12 DIAGNOSIS — N186 End stage renal disease: Secondary | ICD-10-CM | POA: Diagnosis not present

## 2020-10-13 DIAGNOSIS — N186 End stage renal disease: Secondary | ICD-10-CM | POA: Diagnosis not present

## 2020-10-13 DIAGNOSIS — N2581 Secondary hyperparathyroidism of renal origin: Secondary | ICD-10-CM | POA: Diagnosis not present

## 2020-10-13 DIAGNOSIS — Z992 Dependence on renal dialysis: Secondary | ICD-10-CM | POA: Diagnosis not present

## 2020-10-15 DIAGNOSIS — Z992 Dependence on renal dialysis: Secondary | ICD-10-CM | POA: Diagnosis not present

## 2020-10-15 DIAGNOSIS — N2581 Secondary hyperparathyroidism of renal origin: Secondary | ICD-10-CM | POA: Diagnosis not present

## 2020-10-15 DIAGNOSIS — N186 End stage renal disease: Secondary | ICD-10-CM | POA: Diagnosis not present

## 2020-10-17 DIAGNOSIS — N2581 Secondary hyperparathyroidism of renal origin: Secondary | ICD-10-CM | POA: Diagnosis not present

## 2020-10-17 DIAGNOSIS — Z992 Dependence on renal dialysis: Secondary | ICD-10-CM | POA: Diagnosis not present

## 2020-10-17 DIAGNOSIS — N186 End stage renal disease: Secondary | ICD-10-CM | POA: Diagnosis not present

## 2020-10-19 ENCOUNTER — Telehealth: Payer: Self-pay

## 2020-10-19 NOTE — Telephone Encounter (Signed)
Left message for pt to call back so I can go over some information with him.

## 2020-10-20 ENCOUNTER — Encounter: Payer: Self-pay | Admitting: Family Medicine

## 2020-10-20 ENCOUNTER — Other Ambulatory Visit: Payer: Self-pay

## 2020-10-20 ENCOUNTER — Ambulatory Visit (INDEPENDENT_AMBULATORY_CARE_PROVIDER_SITE_OTHER): Payer: Medicare Other | Admitting: Family Medicine

## 2020-10-20 VITALS — BP 102/60 | HR 64 | Ht 72.0 in | Wt 174.0 lb

## 2020-10-20 DIAGNOSIS — D631 Anemia in chronic kidney disease: Secondary | ICD-10-CM | POA: Diagnosis not present

## 2020-10-20 DIAGNOSIS — R972 Elevated prostate specific antigen [PSA]: Secondary | ICD-10-CM

## 2020-10-20 DIAGNOSIS — I10 Essential (primary) hypertension: Secondary | ICD-10-CM

## 2020-10-20 DIAGNOSIS — N401 Enlarged prostate with lower urinary tract symptoms: Secondary | ICD-10-CM | POA: Diagnosis not present

## 2020-10-20 DIAGNOSIS — Z7689 Persons encountering health services in other specified circumstances: Secondary | ICD-10-CM

## 2020-10-20 DIAGNOSIS — N3943 Post-void dribbling: Secondary | ICD-10-CM

## 2020-10-20 DIAGNOSIS — N189 Chronic kidney disease, unspecified: Secondary | ICD-10-CM | POA: Diagnosis not present

## 2020-10-20 DIAGNOSIS — N186 End stage renal disease: Secondary | ICD-10-CM

## 2020-10-20 DIAGNOSIS — E7801 Familial hypercholesterolemia: Secondary | ICD-10-CM | POA: Diagnosis not present

## 2020-10-20 DIAGNOSIS — N2581 Secondary hyperparathyroidism of renal origin: Secondary | ICD-10-CM | POA: Diagnosis not present

## 2020-10-20 DIAGNOSIS — Z992 Dependence on renal dialysis: Secondary | ICD-10-CM

## 2020-10-20 NOTE — Progress Notes (Signed)
Date:  10/20/2020   Name:  Dwayne Reed   DOB:  Mar 26, 1955   MRN:  [id number]   Chief Complaint: Establish Care and Congestive Heart Failure (Stopped both meds because his "blood pressure is running too low")  Congestive Heart Failure   Lab Results  Component Value Date   CREATININE 9.80 (H) 03/09/2020   BUN 30 (H) 03/09/2020   NA 136 03/09/2020   K 3.8 03/09/2020   CL 96 (L) 03/09/2020   CO2 29 02/26/2020   Lab Results  Component Value Date   CHOL 207 (H) 09/27/2019   HDL 44 09/27/2019   LDLCALC 112 (H) 09/27/2019   LDLDIRECT 152.0 03/27/2019   TRIG 359 (H) 09/27/2019   CHOLHDL 4.7 09/27/2019   No results found for: TSH No results found for: HGBA1C Lab Results  Component Value Date   WBC 5.1 02/25/2020   HGB 8.2 (L) 03/09/2020   HCT 24.0 (L) 03/09/2020   MCV 95.3 02/25/2020   PLT 128 (L) 02/25/2020   Lab Results  Component Value Date   ALT 9 02/25/2020   AST 7 (L) 02/25/2020   ALKPHOS 82 02/25/2020   BILITOT 0.8 02/25/2020     Review of Systems  Patient Active Problem List   Diagnosis Date Noted   Urinary tract infection with hematuria    Hydronephrosis with renal and ureteral calculus obstruction 02/24/2020   Breakdown of surgically created AV fistula, init ([place]) 10/23/2017   Decreased energy 10/23/2017   Mild protein-calorie malnutrition ([place]) 08/18/2017   Anemia in chronic kidney disease 07/29/2017   Coagulation defect, unspecified ([place]) 07/29/2017   Iron deficiency anemia, unspecified 07/29/2017   Secondary hyperparathyroidism of renal origin ([place]) 07/29/2017   ESRD on dialysis ([place]) 07/26/2017   SOB (shortness of breath) 07/25/2017   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   COPD (chronic obstructive pulmonary disease) ([place]) 07/14/2016   History of renal cell cancer 07/12/2016   Olecranon bursitis 05/27/2016   Malignant tumor of prostate ([place]) 09/18/2015   Benign prostatic hyperplasia with lower urinary tract symptoms 08/05/2015    Pre-transplant evaluation for kidney transplant 08/05/2015   Acquired arteriovenous fistula ([place]) 04/30/2015   ESRD on hemodialysis ([place]) 02/04/2015   Hyperparathyroidism ([place]) 02/04/2015   Hyperphosphatemia 02/04/2015   Anemia of renal disease 02/04/2015   History of transfusion 12/13/2014   Elevated prostate specific antigen (PSA) 08/18/2012   Essential hypertension 08/18/2012   Gout 08/18/2012   Acquired cyst of kidney 04/13/2012    Allergies  Allergen Reactions   Sensipar [Cinacalcet]     Other reaction(s): Unknown    Past Surgical History:  Procedure Laterality Date   CYSTOSCOPY/URETEROSCOPY/HOLMIUM LASER/STENT PLACEMENT N/A 02/25/2020   Procedure: CYSTOSCOPY/URETEROSCOPY/HOLMIUM LASER/STENT PLACEMENT;  Surgeon: Abbie Sons, MD;  Location: [facility] ORS;  Service: Urology;  Laterality: N/A;   CYSTOSCOPY/URETEROSCOPY/HOLMIUM LASER/STENT PLACEMENT Right 03/09/2020   Procedure: CYSTOSCOPY/URETEROSCOPY/HOLMIUM LASER/STENT Exchange;  Surgeon: Abbie Sons, MD;  Location: [facility] ORS;  Service: Urology;  Laterality: Right;   EXTRACORPOREAL SHOCK WAVE LITHOTRIPSY     LITHOTRIPSY     x 5   PROSTATE BIOPSY      Social History   Tobacco Use   Smoking status: Every Day    Packs/day: 0.50    Years: 30.00    Pack years: 15.00    Types: Cigarettes   Smokeless tobacco: Never  Vaping Use   Vaping Use: Never used  Substance Use Topics   Alcohol use: Yes    Comment: on holidays   Drug use:  Never     Medication list has been reviewed and updated.  Current Meds  Medication Sig   AURYXIA 1 GM 210 MG(Fe) tablet Take 3 tablets (630 mg total) by mouth 3 (three) times daily.   B Complex-C-Zn-Folic Acid (DIALYVITE 161-WRUE 15) 0.8 MG TABS Take 1 tablet by mouth every evening.   Methoxy PEG-Epoetin Beta (MIRCERA IJ) Mircera    PHQ 2/9 Scores 10/20/2020 03/27/2019 10/23/2017  PHQ - 2 Score 0 0 0  PHQ- 9 Score 1 - -    GAD 7 : Generalized Anxiety Score 10/20/2020  Nervous, Anxious,  on Edge 0  Control/stop worrying 0  Worry too much - different things 0  Trouble relaxing 0  Restless 0  Easily annoyed or irritable 1  Afraid - awful might happen 0  Total GAD 7 Score 1  Anxiety Difficulty Not difficult at all    BP Readings from Last 3 Encounters:  10/20/20 102/60  03/18/20 (!) 157/84  03/09/20 (!) 145/72    Physical Exam  Wt Readings from Last 3 Encounters:  10/20/20 174 lb (78.9 kg)  03/18/20 181 lb (82.1 kg)  03/09/20 183 lb (83 kg)    BP 102/60   Pulse 64   Ht 6' (1.829 m)   Wt 174 lb (78.9 kg)   BMI 23.60 kg/m   Assessment and Plan:  1. Establishing care with new doctor, encounter for In establishing care so that he has access for his cholesterol maintenance and hypertension.  2. Essential hypertension Chronic.  Controlled.  Stable.  Patient was on carvedilol and amlodipine however patient discontinued on his own because his blood pressure was low but this is most likely due to his dialysis.  Today patient had dialysis and his blood pressure is low normal at 102/60.  Patient is having some orthostatic dizziness but not to a significant degree I would agree to hold his medication and I will be rechecking him on a nondialysis day as far as his blood pressure.  There was some question of congestive heart failure but he does not have any orthopnea, PND, dyspnea on exertion, nor edema.  We will check renal function panel so that we have a basis for his electrolytes and GFR. - Renal Function Panel  3. Familial hypercholesterolemia Chronic.  Questionable control.  Stable.  But quite frankly given the patient has stopped this as well given his end-stage renal disease and prostate concerns cholesterol may be the least of his worries.  We will control this with dietary primarily will hold the statin and we will check a lipid level to see if this may be sufficient. - Lipid Panel With LDL/HDL Ratio  4. Elevated prostate specific antigen (PSA) Patient with  history of PS a elevation which has been followed by Dr. Bernardo Heater in the past and we have suggested return referral for this to be followed up on.  5. Benign prostatic hyperplasia with post-void dribbling Chronic.  Controlled.  Stable.  Patient is currently on no medications for this is that he has stopped his Flomax but is not having issues with hesitancy.  We will check PSA and patient's been encouraged to return for urology appointment. - PSA  6. ESRD on hemodialysis [facility]) Patient has ESRD and is being followed by Madelon Lips with nephrology in [place], patient is also seeing Dr. Candiss Norse but this sounds like it was a consult concern.  Patient has dialysis 3 days a week on Tuesday Thursday Saturday and is tolerating this well although he  feels very fatigued afterwards.  We have encouraged for return to nephrology on a regular basis just to check basic for other aspects of his renal disease at least on a 33-month basis. - Ambulatory referral to Nephrology  7. Secondary hyperparathyroidism of renal origin [facility] Patient is on replacement therapy for secondary hyperparathyroidism with replacement of phosphorus.  This will be within the control of nephrology as far as continuance. - Ambulatory referral to Nephrology  8. Anemia of renal disease Patient also has a history of anemia of renal disease which she is on Epogen but I am uncertain as to the administering of this and who is providing this replacement and as to when he was receiving it.  Again we will leave this to nephrology determined the continuance of this.  We will check CBC for current control. - Ambulatory referral to Nephrology - CBC with Differential/Platelet   Patient has been advised of the health risks of smoking and counseled concerning cessation of tobacco products. I spent over 3 minutes for discussion and to answer questions.

## 2020-10-21 LAB — CBC WITH DIFFERENTIAL/PLATELET
Basophils Absolute: 0.1 10*3/uL (ref 0.0–0.2)
Basos: 1 %
EOS (ABSOLUTE): 0.4 10*3/uL (ref 0.0–0.4)
Eos: 7 %
Hematocrit: 44.7 % (ref 37.5–51.0)
Hemoglobin: 15 g/dL (ref 13.0–17.7)
Immature Grans (Abs): 0 10*3/uL (ref 0.0–0.1)
Immature Granulocytes: 0 %
Lymphocytes Absolute: 1.3 10*3/uL (ref 0.7–3.1)
Lymphs: 21 %
MCH: 33 pg (ref 26.6–33.0)
MCHC: 33.6 g/dL (ref 31.5–35.7)
MCV: 99 fL — ABNORMAL HIGH (ref 79–97)
Monocytes Absolute: 0.6 10*3/uL (ref 0.1–0.9)
Monocytes: 10 %
Neutrophils Absolute: 3.8 10*3/uL (ref 1.4–7.0)
Neutrophils: 61 %
Platelets: 173 10*3/uL (ref 150–450)
RBC: 4.54 x10E6/uL (ref 4.14–5.80)
RDW: 13.7 % (ref 11.6–15.4)
WBC: 6.1 10*3/uL (ref 3.4–10.8)

## 2020-10-21 LAB — RENAL FUNCTION PANEL
Albumin: 4.5 g/dL (ref 3.8–4.8)
BUN/Creatinine Ratio: 3 — ABNORMAL LOW (ref 10–24)
BUN: 23 mg/dL (ref 8–27)
CO2: 30 mmol/L — ABNORMAL HIGH (ref 20–29)
Calcium: 8.8 mg/dL (ref 8.6–10.2)
Chloride: 93 mmol/L — ABNORMAL LOW (ref 96–106)
Creatinine, Ser: 7.56 mg/dL — ABNORMAL HIGH (ref 0.76–1.27)
Glucose: 88 mg/dL (ref 65–99)
Phosphorus: 2.9 mg/dL (ref 2.8–4.1)
Potassium: 4.3 mmol/L (ref 3.5–5.2)
Sodium: 141 mmol/L (ref 134–144)
eGFR: 7 mL/min/{1.73_m2} — ABNORMAL LOW (ref >59)

## 2020-10-21 LAB — LIPID PANEL WITH LDL/HDL RATIO
Cholesterol, Total: 236 mg/dL — ABNORMAL HIGH (ref 100–199)
HDL: 43 mg/dL (ref >39)
LDL Chol Calc (NIH): 123 mg/dL — ABNORMAL HIGH (ref 0–99)
LDL/HDL Ratio: 2.9 ratio (ref 0.0–3.6)
Triglycerides: 396 mg/dL — ABNORMAL HIGH (ref 0–149)
VLDL Cholesterol Cal: 70 mg/dL — ABNORMAL HIGH (ref 5–40)

## 2020-10-21 LAB — PSA: Prostate Specific Ag, Serum: 12.1 ng/mL — ABNORMAL HIGH (ref 0.0–4.0)

## 2020-10-22 DIAGNOSIS — N2581 Secondary hyperparathyroidism of renal origin: Secondary | ICD-10-CM | POA: Diagnosis not present

## 2020-10-22 DIAGNOSIS — Z992 Dependence on renal dialysis: Secondary | ICD-10-CM | POA: Diagnosis not present

## 2020-10-22 DIAGNOSIS — N186 End stage renal disease: Secondary | ICD-10-CM | POA: Diagnosis not present

## 2020-10-23 ENCOUNTER — Other Ambulatory Visit: Payer: Self-pay

## 2020-10-23 ENCOUNTER — Ambulatory Visit (INDEPENDENT_AMBULATORY_CARE_PROVIDER_SITE_OTHER): Payer: Medicare Other | Admitting: Podiatry

## 2020-10-23 ENCOUNTER — Encounter: Payer: Self-pay | Admitting: Podiatry

## 2020-10-23 DIAGNOSIS — D631 Anemia in chronic kidney disease: Secondary | ICD-10-CM | POA: Diagnosis not present

## 2020-10-23 DIAGNOSIS — Z992 Dependence on renal dialysis: Secondary | ICD-10-CM

## 2020-10-23 DIAGNOSIS — B351 Tinea unguium: Secondary | ICD-10-CM | POA: Diagnosis not present

## 2020-10-23 DIAGNOSIS — M79674 Pain in right toe(s): Secondary | ICD-10-CM

## 2020-10-23 DIAGNOSIS — L84 Corns and callosities: Secondary | ICD-10-CM

## 2020-10-23 DIAGNOSIS — N186 End stage renal disease: Secondary | ICD-10-CM

## 2020-10-23 DIAGNOSIS — M79675 Pain in left toe(s): Secondary | ICD-10-CM

## 2020-10-23 DIAGNOSIS — Q828 Other specified congenital malformations of skin: Secondary | ICD-10-CM

## 2020-10-27 DIAGNOSIS — Z9115 Patient's noncompliance with renal dialysis: Secondary | ICD-10-CM | POA: Diagnosis not present

## 2020-10-27 DIAGNOSIS — M109 Gout, unspecified: Secondary | ICD-10-CM | POA: Diagnosis not present

## 2020-10-27 DIAGNOSIS — Z992 Dependence on renal dialysis: Secondary | ICD-10-CM | POA: Diagnosis not present

## 2020-10-27 DIAGNOSIS — Z79899 Other long term (current) drug therapy: Secondary | ICD-10-CM | POA: Diagnosis not present

## 2020-10-27 DIAGNOSIS — I120 Hypertensive chronic kidney disease with stage 5 chronic kidney disease or end stage renal disease: Secondary | ICD-10-CM | POA: Diagnosis not present

## 2020-10-27 DIAGNOSIS — E213 Hyperparathyroidism, unspecified: Secondary | ICD-10-CM | POA: Diagnosis not present

## 2020-10-27 DIAGNOSIS — I771 Stricture of artery: Secondary | ICD-10-CM | POA: Diagnosis not present

## 2020-10-27 DIAGNOSIS — D649 Anemia, unspecified: Secondary | ICD-10-CM | POA: Diagnosis not present

## 2020-10-27 DIAGNOSIS — E875 Hyperkalemia: Secondary | ICD-10-CM | POA: Diagnosis not present

## 2020-10-27 DIAGNOSIS — N186 End stage renal disease: Secondary | ICD-10-CM | POA: Diagnosis not present

## 2020-10-27 NOTE — Progress Notes (Signed)
Subjective: Dwayne Reed is a pleasant 65 y.o. male patient seen today painful thick toenails that are difficult to trim. Pain interferes with ambulation. Aggravating factors include wearing enclosed shoe gear. Pain is relieved with periodic professional debridement.  He is seen for at risk foot care secondary to anemia and ESRD requiring  dialysis. He also has coagulation defect.  Patient states he lost his aunt today.   He voices no new pedal problems on today's visit.  PCP is Juline Patch, MD. Last visit was: 10/20/2020.  Allergies  Allergen Reactions   Sensipar [Cinacalcet]     Other reaction(s): Unknown    Objective: Physical Exam  General: Dwayne Reed is a pleasant 65 y.o. African American male, WD, WN in NAD. AAO x 3.   Vascular:  Capillary fill time to digits <3 seconds b/l lower extremities. Palpable DP pulse(s) b/l lower extremities Palpable PT pulse(s) b/l lower extremities Pedal hair absent. Lower extremity skin temperature gradient within normal limits. No pain with calf compression b/l. No edema noted b/l lower extremities. No ischemia or gangrene noted b/l lower extremities.  Dermatological:  Skin warm and supple b/l lower extremities. No open wounds b/l lower extremities. No interdigital macerations b/l lower extremities. Toenails 1-5 b/l elongated, discolored, dystrophic, thickened, crumbly with subungual debris and tenderness to dorsal palpation. Hyperkeratotic lesion(s) submet head 1 left foot and submet head 5 right foot.  No erythema, no edema, no drainage, no fluctuance. Porokeratotic lesion(s) submet head 2 right foot. No erythema, no edema, no drainage, no fluctuance.  Musculoskeletal:  Normal muscle strength 5/5 to all lower extremity muscle groups bilaterally. No pain crepitus or joint limitation noted with ROM b/l lower extremities. Hallux valgus with bunion deformity noted b/l lower extremities. Hammertoe(s) noted to the 2-5 bilaterally.  Neurological:   Protective sensation intact 5/5 intact bilaterally with 10g monofilament b/l. Vibratory sensation intact b/l.  Assessment and Plan:  1. Pain due to onychomycosis of toenails of both feet   2. Callus   3. Porokeratosis   4. ESRD on dialysis ([place])   5. Anemia in ESRD (end-stage renal disease) ([place])   -Examined patient. -Patient to continue soft, supportive shoe gear daily. -Toenails 1-5 b/l were debrided in length and girth with sterile nail nippers and dremel without iatrogenic bleeding.  -Callus(es) submet head 1 left foot and submet head 5 right foot pared utilizing sterile scalpel blade without complication or incident. Total number debrided =2. -Painful porokeratotic lesion(s) submet head 2 right foot pared and enucleated with sterile scalpel blade without incident. Total number of lesions debrided=1. -Patient to report any pedal injuries to medical professional immediately. -Patient/POA to call should there be question/concern in the interim.  Return in about 3 months (around 01/23/2021).  [name], DPM

## 2020-10-28 DIAGNOSIS — N186 End stage renal disease: Secondary | ICD-10-CM | POA: Diagnosis not present

## 2020-10-28 DIAGNOSIS — E875 Hyperkalemia: Secondary | ICD-10-CM | POA: Diagnosis not present

## 2020-10-28 DIAGNOSIS — I10 Essential (primary) hypertension: Secondary | ICD-10-CM | POA: Diagnosis not present

## 2020-10-28 DIAGNOSIS — Z992 Dependence on renal dialysis: Secondary | ICD-10-CM | POA: Diagnosis not present

## 2020-10-28 DIAGNOSIS — D631 Anemia in chronic kidney disease: Secondary | ICD-10-CM | POA: Diagnosis not present

## 2020-10-28 DIAGNOSIS — E872 Acidosis: Secondary | ICD-10-CM | POA: Diagnosis not present

## 2020-10-29 DIAGNOSIS — N186 End stage renal disease: Secondary | ICD-10-CM | POA: Diagnosis not present

## 2020-10-29 DIAGNOSIS — N2581 Secondary hyperparathyroidism of renal origin: Secondary | ICD-10-CM | POA: Diagnosis not present

## 2020-10-29 DIAGNOSIS — Z992 Dependence on renal dialysis: Secondary | ICD-10-CM | POA: Diagnosis not present

## 2020-10-31 DIAGNOSIS — N186 End stage renal disease: Secondary | ICD-10-CM | POA: Diagnosis not present

## 2020-10-31 DIAGNOSIS — Z992 Dependence on renal dialysis: Secondary | ICD-10-CM | POA: Diagnosis not present

## 2020-10-31 DIAGNOSIS — N2581 Secondary hyperparathyroidism of renal origin: Secondary | ICD-10-CM | POA: Diagnosis not present

## 2020-11-03 ENCOUNTER — Telehealth: Payer: Self-pay | Admitting: Family Medicine

## 2020-11-03 DIAGNOSIS — Z992 Dependence on renal dialysis: Secondary | ICD-10-CM | POA: Diagnosis not present

## 2020-11-03 DIAGNOSIS — N2581 Secondary hyperparathyroidism of renal origin: Secondary | ICD-10-CM | POA: Diagnosis not present

## 2020-11-03 DIAGNOSIS — N186 End stage renal disease: Secondary | ICD-10-CM | POA: Diagnosis not present

## 2020-11-03 NOTE — Telephone Encounter (Signed)
Copied from [place] [id number]. Topic: Medicare AWV >> Nov 03, 2020 10:41 AM Cher Nakai R wrote: Reason for CRM No answer unable to leave a message for patient to call back and schedule Medicare Annual Wellness Visit (AWV) in office.   If unable to come into the office for AWV,  please offer to do virtually or by telephone.  No hx of AWV eligible for AWVI as of 03/14/2016  Please schedule at anytime with [facility] Health Advisor.      40 Minutes appointment   Any questions, please call me at [phone]

## 2020-11-03 NOTE — Telephone Encounter (Signed)
Created in Error

## 2020-11-05 DIAGNOSIS — N2581 Secondary hyperparathyroidism of renal origin: Secondary | ICD-10-CM | POA: Diagnosis not present

## 2020-11-05 DIAGNOSIS — Z992 Dependence on renal dialysis: Secondary | ICD-10-CM | POA: Diagnosis not present

## 2020-11-05 DIAGNOSIS — N186 End stage renal disease: Secondary | ICD-10-CM | POA: Diagnosis not present

## 2020-11-07 DIAGNOSIS — N2581 Secondary hyperparathyroidism of renal origin: Secondary | ICD-10-CM | POA: Diagnosis not present

## 2020-11-07 DIAGNOSIS — N186 End stage renal disease: Secondary | ICD-10-CM | POA: Diagnosis not present

## 2020-11-07 DIAGNOSIS — Z992 Dependence on renal dialysis: Secondary | ICD-10-CM | POA: Diagnosis not present

## 2020-11-10 DIAGNOSIS — N2581 Secondary hyperparathyroidism of renal origin: Secondary | ICD-10-CM | POA: Diagnosis not present

## 2020-11-10 DIAGNOSIS — N186 End stage renal disease: Secondary | ICD-10-CM | POA: Diagnosis not present

## 2020-11-10 DIAGNOSIS — Z992 Dependence on renal dialysis: Secondary | ICD-10-CM | POA: Diagnosis not present

## 2020-11-12 DIAGNOSIS — I129 Hypertensive chronic kidney disease with stage 1 through stage 4 chronic kidney disease, or unspecified chronic kidney disease: Secondary | ICD-10-CM | POA: Diagnosis not present

## 2020-11-12 DIAGNOSIS — Z992 Dependence on renal dialysis: Secondary | ICD-10-CM | POA: Diagnosis not present

## 2020-11-12 DIAGNOSIS — N2581 Secondary hyperparathyroidism of renal origin: Secondary | ICD-10-CM | POA: Diagnosis not present

## 2020-11-12 DIAGNOSIS — N186 End stage renal disease: Secondary | ICD-10-CM | POA: Diagnosis not present

## 2020-11-14 DIAGNOSIS — Z992 Dependence on renal dialysis: Secondary | ICD-10-CM | POA: Diagnosis not present

## 2020-11-14 DIAGNOSIS — N2581 Secondary hyperparathyroidism of renal origin: Secondary | ICD-10-CM | POA: Diagnosis not present

## 2020-11-14 DIAGNOSIS — N186 End stage renal disease: Secondary | ICD-10-CM | POA: Diagnosis not present

## 2020-11-17 DIAGNOSIS — N186 End stage renal disease: Secondary | ICD-10-CM | POA: Diagnosis not present

## 2020-11-17 DIAGNOSIS — Z992 Dependence on renal dialysis: Secondary | ICD-10-CM | POA: Diagnosis not present

## 2020-11-17 DIAGNOSIS — N2581 Secondary hyperparathyroidism of renal origin: Secondary | ICD-10-CM | POA: Diagnosis not present

## 2020-11-19 DIAGNOSIS — Z992 Dependence on renal dialysis: Secondary | ICD-10-CM | POA: Diagnosis not present

## 2020-11-19 DIAGNOSIS — N186 End stage renal disease: Secondary | ICD-10-CM | POA: Diagnosis not present

## 2020-11-19 DIAGNOSIS — N2581 Secondary hyperparathyroidism of renal origin: Secondary | ICD-10-CM | POA: Diagnosis not present

## 2020-11-21 DIAGNOSIS — Z992 Dependence on renal dialysis: Secondary | ICD-10-CM | POA: Diagnosis not present

## 2020-11-21 DIAGNOSIS — N186 End stage renal disease: Secondary | ICD-10-CM | POA: Diagnosis not present

## 2020-11-21 DIAGNOSIS — N2581 Secondary hyperparathyroidism of renal origin: Secondary | ICD-10-CM | POA: Diagnosis not present

## 2020-11-24 DIAGNOSIS — Z992 Dependence on renal dialysis: Secondary | ICD-10-CM | POA: Diagnosis not present

## 2020-11-24 DIAGNOSIS — N2581 Secondary hyperparathyroidism of renal origin: Secondary | ICD-10-CM | POA: Diagnosis not present

## 2020-11-24 DIAGNOSIS — N186 End stage renal disease: Secondary | ICD-10-CM | POA: Diagnosis not present

## 2020-11-25 DIAGNOSIS — N186 End stage renal disease: Secondary | ICD-10-CM | POA: Diagnosis not present

## 2020-11-25 DIAGNOSIS — I871 Compression of vein: Secondary | ICD-10-CM | POA: Diagnosis not present

## 2020-11-25 DIAGNOSIS — Z992 Dependence on renal dialysis: Secondary | ICD-10-CM | POA: Diagnosis not present

## 2020-11-25 DIAGNOSIS — T82858A Stenosis of vascular prosthetic devices, implants and grafts, initial encounter: Secondary | ICD-10-CM | POA: Diagnosis not present

## 2020-11-26 DIAGNOSIS — N2581 Secondary hyperparathyroidism of renal origin: Secondary | ICD-10-CM | POA: Diagnosis not present

## 2020-11-26 DIAGNOSIS — Z992 Dependence on renal dialysis: Secondary | ICD-10-CM | POA: Diagnosis not present

## 2020-11-26 DIAGNOSIS — N186 End stage renal disease: Secondary | ICD-10-CM | POA: Diagnosis not present

## 2020-11-28 DIAGNOSIS — N2581 Secondary hyperparathyroidism of renal origin: Secondary | ICD-10-CM | POA: Diagnosis not present

## 2020-11-28 DIAGNOSIS — Z992 Dependence on renal dialysis: Secondary | ICD-10-CM | POA: Diagnosis not present

## 2020-11-28 DIAGNOSIS — N186 End stage renal disease: Secondary | ICD-10-CM | POA: Diagnosis not present

## 2020-12-01 DIAGNOSIS — N186 End stage renal disease: Secondary | ICD-10-CM | POA: Diagnosis not present

## 2020-12-01 DIAGNOSIS — Z992 Dependence on renal dialysis: Secondary | ICD-10-CM | POA: Diagnosis not present

## 2020-12-01 DIAGNOSIS — N2581 Secondary hyperparathyroidism of renal origin: Secondary | ICD-10-CM | POA: Diagnosis not present

## 2020-12-03 DIAGNOSIS — Z992 Dependence on renal dialysis: Secondary | ICD-10-CM | POA: Diagnosis not present

## 2020-12-03 DIAGNOSIS — N186 End stage renal disease: Secondary | ICD-10-CM | POA: Diagnosis not present

## 2020-12-03 DIAGNOSIS — N2581 Secondary hyperparathyroidism of renal origin: Secondary | ICD-10-CM | POA: Diagnosis not present

## 2020-12-05 DIAGNOSIS — N186 End stage renal disease: Secondary | ICD-10-CM | POA: Diagnosis not present

## 2020-12-05 DIAGNOSIS — Z992 Dependence on renal dialysis: Secondary | ICD-10-CM | POA: Diagnosis not present

## 2020-12-05 DIAGNOSIS — N2581 Secondary hyperparathyroidism of renal origin: Secondary | ICD-10-CM | POA: Diagnosis not present

## 2020-12-08 DIAGNOSIS — N186 End stage renal disease: Secondary | ICD-10-CM | POA: Diagnosis not present

## 2020-12-08 DIAGNOSIS — Z992 Dependence on renal dialysis: Secondary | ICD-10-CM | POA: Diagnosis not present

## 2020-12-08 DIAGNOSIS — N2581 Secondary hyperparathyroidism of renal origin: Secondary | ICD-10-CM | POA: Diagnosis not present

## 2020-12-10 DIAGNOSIS — N2581 Secondary hyperparathyroidism of renal origin: Secondary | ICD-10-CM | POA: Diagnosis not present

## 2020-12-10 DIAGNOSIS — Z992 Dependence on renal dialysis: Secondary | ICD-10-CM | POA: Diagnosis not present

## 2020-12-10 DIAGNOSIS — N186 End stage renal disease: Secondary | ICD-10-CM | POA: Diagnosis not present

## 2020-12-12 DIAGNOSIS — I129 Hypertensive chronic kidney disease with stage 1 through stage 4 chronic kidney disease, or unspecified chronic kidney disease: Secondary | ICD-10-CM | POA: Diagnosis not present

## 2020-12-12 DIAGNOSIS — Z23 Encounter for immunization: Secondary | ICD-10-CM | POA: Diagnosis not present

## 2020-12-12 DIAGNOSIS — Z992 Dependence on renal dialysis: Secondary | ICD-10-CM | POA: Diagnosis not present

## 2020-12-12 DIAGNOSIS — N186 End stage renal disease: Secondary | ICD-10-CM | POA: Diagnosis not present

## 2020-12-12 DIAGNOSIS — N2581 Secondary hyperparathyroidism of renal origin: Secondary | ICD-10-CM | POA: Diagnosis not present

## 2020-12-15 DIAGNOSIS — N2581 Secondary hyperparathyroidism of renal origin: Secondary | ICD-10-CM | POA: Diagnosis not present

## 2020-12-15 DIAGNOSIS — Z992 Dependence on renal dialysis: Secondary | ICD-10-CM | POA: Diagnosis not present

## 2020-12-15 DIAGNOSIS — N186 End stage renal disease: Secondary | ICD-10-CM | POA: Diagnosis not present

## 2020-12-15 DIAGNOSIS — Z23 Encounter for immunization: Secondary | ICD-10-CM | POA: Diagnosis not present

## 2020-12-17 DIAGNOSIS — N186 End stage renal disease: Secondary | ICD-10-CM | POA: Diagnosis not present

## 2020-12-17 DIAGNOSIS — N2581 Secondary hyperparathyroidism of renal origin: Secondary | ICD-10-CM | POA: Diagnosis not present

## 2020-12-17 DIAGNOSIS — Z23 Encounter for immunization: Secondary | ICD-10-CM | POA: Diagnosis not present

## 2020-12-17 DIAGNOSIS — Z992 Dependence on renal dialysis: Secondary | ICD-10-CM | POA: Diagnosis not present

## 2020-12-19 DIAGNOSIS — N2581 Secondary hyperparathyroidism of renal origin: Secondary | ICD-10-CM | POA: Diagnosis not present

## 2020-12-19 DIAGNOSIS — Z23 Encounter for immunization: Secondary | ICD-10-CM | POA: Diagnosis not present

## 2020-12-19 DIAGNOSIS — N186 End stage renal disease: Secondary | ICD-10-CM | POA: Diagnosis not present

## 2020-12-19 DIAGNOSIS — Z992 Dependence on renal dialysis: Secondary | ICD-10-CM | POA: Diagnosis not present

## 2020-12-22 DIAGNOSIS — N186 End stage renal disease: Secondary | ICD-10-CM | POA: Diagnosis not present

## 2020-12-22 DIAGNOSIS — Z23 Encounter for immunization: Secondary | ICD-10-CM | POA: Diagnosis not present

## 2020-12-22 DIAGNOSIS — N2581 Secondary hyperparathyroidism of renal origin: Secondary | ICD-10-CM | POA: Diagnosis not present

## 2020-12-22 DIAGNOSIS — Z992 Dependence on renal dialysis: Secondary | ICD-10-CM | POA: Diagnosis not present

## 2020-12-24 DIAGNOSIS — N186 End stage renal disease: Secondary | ICD-10-CM | POA: Diagnosis not present

## 2020-12-24 DIAGNOSIS — Z23 Encounter for immunization: Secondary | ICD-10-CM | POA: Diagnosis not present

## 2020-12-24 DIAGNOSIS — Z992 Dependence on renal dialysis: Secondary | ICD-10-CM | POA: Diagnosis not present

## 2020-12-24 DIAGNOSIS — N2581 Secondary hyperparathyroidism of renal origin: Secondary | ICD-10-CM | POA: Diagnosis not present

## 2020-12-26 DIAGNOSIS — N2581 Secondary hyperparathyroidism of renal origin: Secondary | ICD-10-CM | POA: Diagnosis not present

## 2020-12-26 DIAGNOSIS — N186 End stage renal disease: Secondary | ICD-10-CM | POA: Diagnosis not present

## 2020-12-26 DIAGNOSIS — Z23 Encounter for immunization: Secondary | ICD-10-CM | POA: Diagnosis not present

## 2020-12-26 DIAGNOSIS — Z992 Dependence on renal dialysis: Secondary | ICD-10-CM | POA: Diagnosis not present

## 2020-12-29 DIAGNOSIS — Z992 Dependence on renal dialysis: Secondary | ICD-10-CM | POA: Diagnosis not present

## 2020-12-29 DIAGNOSIS — Z23 Encounter for immunization: Secondary | ICD-10-CM | POA: Diagnosis not present

## 2020-12-29 DIAGNOSIS — N186 End stage renal disease: Secondary | ICD-10-CM | POA: Diagnosis not present

## 2020-12-29 DIAGNOSIS — N2581 Secondary hyperparathyroidism of renal origin: Secondary | ICD-10-CM | POA: Diagnosis not present

## 2020-12-31 DIAGNOSIS — Z992 Dependence on renal dialysis: Secondary | ICD-10-CM | POA: Diagnosis not present

## 2020-12-31 DIAGNOSIS — Z23 Encounter for immunization: Secondary | ICD-10-CM | POA: Diagnosis not present

## 2020-12-31 DIAGNOSIS — N2581 Secondary hyperparathyroidism of renal origin: Secondary | ICD-10-CM | POA: Diagnosis not present

## 2020-12-31 DIAGNOSIS — N186 End stage renal disease: Secondary | ICD-10-CM | POA: Diagnosis not present

## 2021-01-02 DIAGNOSIS — N186 End stage renal disease: Secondary | ICD-10-CM | POA: Diagnosis not present

## 2021-01-02 DIAGNOSIS — N2581 Secondary hyperparathyroidism of renal origin: Secondary | ICD-10-CM | POA: Diagnosis not present

## 2021-01-02 DIAGNOSIS — Z23 Encounter for immunization: Secondary | ICD-10-CM | POA: Diagnosis not present

## 2021-01-02 DIAGNOSIS — Z992 Dependence on renal dialysis: Secondary | ICD-10-CM | POA: Diagnosis not present

## 2021-01-05 DIAGNOSIS — N2581 Secondary hyperparathyroidism of renal origin: Secondary | ICD-10-CM | POA: Diagnosis not present

## 2021-01-05 DIAGNOSIS — Z23 Encounter for immunization: Secondary | ICD-10-CM | POA: Diagnosis not present

## 2021-01-05 DIAGNOSIS — Z992 Dependence on renal dialysis: Secondary | ICD-10-CM | POA: Diagnosis not present

## 2021-01-05 DIAGNOSIS — N186 End stage renal disease: Secondary | ICD-10-CM | POA: Diagnosis not present

## 2021-01-07 DIAGNOSIS — N186 End stage renal disease: Secondary | ICD-10-CM | POA: Diagnosis not present

## 2021-01-07 DIAGNOSIS — Z992 Dependence on renal dialysis: Secondary | ICD-10-CM | POA: Diagnosis not present

## 2021-01-07 DIAGNOSIS — Z23 Encounter for immunization: Secondary | ICD-10-CM | POA: Diagnosis not present

## 2021-01-07 DIAGNOSIS — N2581 Secondary hyperparathyroidism of renal origin: Secondary | ICD-10-CM | POA: Diagnosis not present

## 2021-01-09 DIAGNOSIS — N186 End stage renal disease: Secondary | ICD-10-CM | POA: Diagnosis not present

## 2021-01-09 DIAGNOSIS — Z23 Encounter for immunization: Secondary | ICD-10-CM | POA: Diagnosis not present

## 2021-01-09 DIAGNOSIS — N2581 Secondary hyperparathyroidism of renal origin: Secondary | ICD-10-CM | POA: Diagnosis not present

## 2021-01-09 DIAGNOSIS — Z992 Dependence on renal dialysis: Secondary | ICD-10-CM | POA: Diagnosis not present

## 2021-01-12 DIAGNOSIS — I129 Hypertensive chronic kidney disease with stage 1 through stage 4 chronic kidney disease, or unspecified chronic kidney disease: Secondary | ICD-10-CM | POA: Diagnosis not present

## 2021-01-12 DIAGNOSIS — N2581 Secondary hyperparathyroidism of renal origin: Secondary | ICD-10-CM | POA: Diagnosis not present

## 2021-01-12 DIAGNOSIS — N186 End stage renal disease: Secondary | ICD-10-CM | POA: Diagnosis not present

## 2021-01-12 DIAGNOSIS — Z992 Dependence on renal dialysis: Secondary | ICD-10-CM | POA: Diagnosis not present

## 2021-01-14 DIAGNOSIS — N2581 Secondary hyperparathyroidism of renal origin: Secondary | ICD-10-CM | POA: Diagnosis not present

## 2021-01-14 DIAGNOSIS — N186 End stage renal disease: Secondary | ICD-10-CM | POA: Diagnosis not present

## 2021-01-14 DIAGNOSIS — Z992 Dependence on renal dialysis: Secondary | ICD-10-CM | POA: Diagnosis not present

## 2021-01-16 DIAGNOSIS — N2581 Secondary hyperparathyroidism of renal origin: Secondary | ICD-10-CM | POA: Diagnosis not present

## 2021-01-16 DIAGNOSIS — N186 End stage renal disease: Secondary | ICD-10-CM | POA: Diagnosis not present

## 2021-01-16 DIAGNOSIS — Z992 Dependence on renal dialysis: Secondary | ICD-10-CM | POA: Diagnosis not present

## 2021-01-19 DIAGNOSIS — N2581 Secondary hyperparathyroidism of renal origin: Secondary | ICD-10-CM | POA: Diagnosis not present

## 2021-01-19 DIAGNOSIS — Z992 Dependence on renal dialysis: Secondary | ICD-10-CM | POA: Diagnosis not present

## 2021-01-19 DIAGNOSIS — N186 End stage renal disease: Secondary | ICD-10-CM | POA: Diagnosis not present

## 2021-01-21 DIAGNOSIS — N186 End stage renal disease: Secondary | ICD-10-CM | POA: Diagnosis not present

## 2021-01-21 DIAGNOSIS — Z992 Dependence on renal dialysis: Secondary | ICD-10-CM | POA: Diagnosis not present

## 2021-01-21 DIAGNOSIS — N2581 Secondary hyperparathyroidism of renal origin: Secondary | ICD-10-CM | POA: Diagnosis not present

## 2021-01-23 DIAGNOSIS — N2581 Secondary hyperparathyroidism of renal origin: Secondary | ICD-10-CM | POA: Diagnosis not present

## 2021-01-23 DIAGNOSIS — N186 End stage renal disease: Secondary | ICD-10-CM | POA: Diagnosis not present

## 2021-01-23 DIAGNOSIS — Z992 Dependence on renal dialysis: Secondary | ICD-10-CM | POA: Diagnosis not present

## 2021-01-26 DIAGNOSIS — Z992 Dependence on renal dialysis: Secondary | ICD-10-CM | POA: Diagnosis not present

## 2021-01-26 DIAGNOSIS — N186 End stage renal disease: Secondary | ICD-10-CM | POA: Diagnosis not present

## 2021-01-26 DIAGNOSIS — N2581 Secondary hyperparathyroidism of renal origin: Secondary | ICD-10-CM | POA: Diagnosis not present

## 2021-01-28 DIAGNOSIS — Z992 Dependence on renal dialysis: Secondary | ICD-10-CM | POA: Diagnosis not present

## 2021-01-28 DIAGNOSIS — N2581 Secondary hyperparathyroidism of renal origin: Secondary | ICD-10-CM | POA: Diagnosis not present

## 2021-01-28 DIAGNOSIS — N186 End stage renal disease: Secondary | ICD-10-CM | POA: Diagnosis not present

## 2021-01-29 ENCOUNTER — Ambulatory Visit (INDEPENDENT_AMBULATORY_CARE_PROVIDER_SITE_OTHER): Payer: Medicare Other | Admitting: Podiatry

## 2021-01-29 ENCOUNTER — Encounter: Payer: Self-pay | Admitting: Podiatry

## 2021-01-29 ENCOUNTER — Other Ambulatory Visit: Payer: Self-pay

## 2021-01-29 DIAGNOSIS — M79675 Pain in left toe(s): Secondary | ICD-10-CM | POA: Diagnosis not present

## 2021-01-29 DIAGNOSIS — Z992 Dependence on renal dialysis: Secondary | ICD-10-CM

## 2021-01-29 DIAGNOSIS — D631 Anemia in chronic kidney disease: Secondary | ICD-10-CM

## 2021-01-29 DIAGNOSIS — L84 Corns and callosities: Secondary | ICD-10-CM | POA: Diagnosis not present

## 2021-01-29 DIAGNOSIS — M79674 Pain in right toe(s): Secondary | ICD-10-CM | POA: Diagnosis not present

## 2021-01-29 DIAGNOSIS — N186 End stage renal disease: Secondary | ICD-10-CM | POA: Diagnosis not present

## 2021-01-29 DIAGNOSIS — Q828 Other specified congenital malformations of skin: Secondary | ICD-10-CM

## 2021-01-29 DIAGNOSIS — B351 Tinea unguium: Secondary | ICD-10-CM | POA: Diagnosis not present

## 2021-01-30 DIAGNOSIS — Z992 Dependence on renal dialysis: Secondary | ICD-10-CM | POA: Diagnosis not present

## 2021-01-30 DIAGNOSIS — N2581 Secondary hyperparathyroidism of renal origin: Secondary | ICD-10-CM | POA: Diagnosis not present

## 2021-01-30 DIAGNOSIS — N186 End stage renal disease: Secondary | ICD-10-CM | POA: Diagnosis not present

## 2021-02-02 DIAGNOSIS — N2581 Secondary hyperparathyroidism of renal origin: Secondary | ICD-10-CM | POA: Diagnosis not present

## 2021-02-02 DIAGNOSIS — N186 End stage renal disease: Secondary | ICD-10-CM | POA: Diagnosis not present

## 2021-02-02 DIAGNOSIS — Z992 Dependence on renal dialysis: Secondary | ICD-10-CM | POA: Diagnosis not present

## 2021-02-03 NOTE — Progress Notes (Signed)
  Subjective:  Patient ID: Dwayne Reed, male    DOB: 1955/08/05,  MRN: [id number]  [name] presents to clinic today for at risk foot care. Patient has h/o ESRD on hemodialysis and painful elongated mycotic toenails 1-5 bilaterally which are tender when wearing enclosed shoe gear. Pain is relieved with periodic professional debridement.  His dialysis days are Tuesday, Thursday and Saturday. His nephrologist is Dr. Elmarie Shiley.  He voices no new pedal concerns on today's visit.  PCP is Juline Patch, MD , and last visit was 10/20/2020.  Allergies  Allergen Reactions   Sensipar [Cinacalcet]     Other reaction(s): Unknown    Review of Systems: Negative except as noted in the HPI. Objective:   Constitutional [name] is a pleasant 65 y.o. African American male, in NAD. AAO x 3.   Vascular CFT <3 seconds b/l LE. Palpable DP/PT pulses b/l LE. Digital hair absent b/l. Skin temperature gradient WNL b/l. No pain with calf compression b/l. No edema noted b/l. No cyanosis or clubbing noted b/l LE. No cyanosis or clubbing noted.  Neurologic Normal speech. Oriented to person, place, and time. Protective sensation intact 5/5 intact bilaterally with 10g monofilament b/l. Vibratory sensation intact b/l.  Dermatologic Pedal skin warm and supple b/l.  No open wounds b/l. No interdigital macerations. Toenails 1-5 b/l elongated, thickened, discolored with subungual debris. +Tenderness with dorsal palpation of nailplates. Hyperkeratotic lesion(s) noted submet head 1 left foot and submet head 5 right foot.  Porokeratotic lesion(s) noted submet head 2 right foot.  Orthopedic: Normal muscle strength 5/5 to all lower extremity muscle groups bilaterally. HAV with bunion deformity noted b/l LE. Hammertoe deformity noted 2-5 b/l.Marland Kitchen No pain, crepitus or joint limitation noted with ROM b/l LE.  Patient ambulates independently without assistive aids.   Radiographs: None Assessment:   1. Pain due to  onychomycosis of toenails of both feet   2. Callus   3. Porokeratosis   4. Anemia in ESRD (end-stage renal disease) ([place])   5. ESRD on dialysis [facility])    Plan:  Patient was evaluated and treated and all questions answered. Consent given for treatment as described below: -No new findings. No new orders. -Patient to continue soft, supportive shoe gear daily. -Mycotic toenails 1-5 bilaterally were debrided in length and girth with sterile nail nippers and dremel without incident. -Callus(es) submet head 1 left foot and submet head 5 right foot pared utilizing sterile scalpel blade without complication or incident. Total number debrided =2. -Painful porokeratotic lesion(s) submet head 2 right foot pared and enucleated with sterile scalpel blade without incident. Total number of lesions debrided=1. -Patient/POA to call should there be question/concern in the interim.  Return in about 3 months (around 05/01/2021).  [name], DPM

## 2021-02-05 DIAGNOSIS — N2581 Secondary hyperparathyroidism of renal origin: Secondary | ICD-10-CM | POA: Diagnosis not present

## 2021-02-05 DIAGNOSIS — Z992 Dependence on renal dialysis: Secondary | ICD-10-CM | POA: Diagnosis not present

## 2021-02-05 DIAGNOSIS — N186 End stage renal disease: Secondary | ICD-10-CM | POA: Diagnosis not present

## 2021-02-07 DIAGNOSIS — Z992 Dependence on renal dialysis: Secondary | ICD-10-CM | POA: Diagnosis not present

## 2021-02-07 DIAGNOSIS — N186 End stage renal disease: Secondary | ICD-10-CM | POA: Diagnosis not present

## 2021-02-07 DIAGNOSIS — N2581 Secondary hyperparathyroidism of renal origin: Secondary | ICD-10-CM | POA: Diagnosis not present

## 2021-02-09 DIAGNOSIS — N186 End stage renal disease: Secondary | ICD-10-CM | POA: Diagnosis not present

## 2021-02-09 DIAGNOSIS — N2581 Secondary hyperparathyroidism of renal origin: Secondary | ICD-10-CM | POA: Diagnosis not present

## 2021-02-09 DIAGNOSIS — Z992 Dependence on renal dialysis: Secondary | ICD-10-CM | POA: Diagnosis not present

## 2021-02-11 DIAGNOSIS — I129 Hypertensive chronic kidney disease with stage 1 through stage 4 chronic kidney disease, or unspecified chronic kidney disease: Secondary | ICD-10-CM | POA: Diagnosis not present

## 2021-02-11 DIAGNOSIS — Z992 Dependence on renal dialysis: Secondary | ICD-10-CM | POA: Diagnosis not present

## 2021-02-11 DIAGNOSIS — N186 End stage renal disease: Secondary | ICD-10-CM | POA: Diagnosis not present

## 2021-02-11 DIAGNOSIS — N2581 Secondary hyperparathyroidism of renal origin: Secondary | ICD-10-CM | POA: Diagnosis not present

## 2021-02-13 DIAGNOSIS — N186 End stage renal disease: Secondary | ICD-10-CM | POA: Diagnosis not present

## 2021-02-13 DIAGNOSIS — N2581 Secondary hyperparathyroidism of renal origin: Secondary | ICD-10-CM | POA: Diagnosis not present

## 2021-02-13 DIAGNOSIS — Z992 Dependence on renal dialysis: Secondary | ICD-10-CM | POA: Diagnosis not present

## 2021-02-15 DIAGNOSIS — Z20822 Contact with and (suspected) exposure to covid-19: Secondary | ICD-10-CM | POA: Diagnosis not present

## 2021-02-16 DIAGNOSIS — N2581 Secondary hyperparathyroidism of renal origin: Secondary | ICD-10-CM | POA: Diagnosis not present

## 2021-02-16 DIAGNOSIS — N186 End stage renal disease: Secondary | ICD-10-CM | POA: Diagnosis not present

## 2021-02-16 DIAGNOSIS — Z992 Dependence on renal dialysis: Secondary | ICD-10-CM | POA: Diagnosis not present

## 2021-02-18 DIAGNOSIS — Z992 Dependence on renal dialysis: Secondary | ICD-10-CM | POA: Diagnosis not present

## 2021-02-18 DIAGNOSIS — N2581 Secondary hyperparathyroidism of renal origin: Secondary | ICD-10-CM | POA: Diagnosis not present

## 2021-02-18 DIAGNOSIS — N186 End stage renal disease: Secondary | ICD-10-CM | POA: Diagnosis not present

## 2021-02-20 DIAGNOSIS — N186 End stage renal disease: Secondary | ICD-10-CM | POA: Diagnosis not present

## 2021-02-20 DIAGNOSIS — N2581 Secondary hyperparathyroidism of renal origin: Secondary | ICD-10-CM | POA: Diagnosis not present

## 2021-02-20 DIAGNOSIS — Z992 Dependence on renal dialysis: Secondary | ICD-10-CM | POA: Diagnosis not present

## 2021-02-23 DIAGNOSIS — N2581 Secondary hyperparathyroidism of renal origin: Secondary | ICD-10-CM | POA: Diagnosis not present

## 2021-02-23 DIAGNOSIS — Z992 Dependence on renal dialysis: Secondary | ICD-10-CM | POA: Diagnosis not present

## 2021-02-23 DIAGNOSIS — N186 End stage renal disease: Secondary | ICD-10-CM | POA: Diagnosis not present

## 2021-02-25 DIAGNOSIS — N2581 Secondary hyperparathyroidism of renal origin: Secondary | ICD-10-CM | POA: Diagnosis not present

## 2021-02-25 DIAGNOSIS — N186 End stage renal disease: Secondary | ICD-10-CM | POA: Diagnosis not present

## 2021-02-25 DIAGNOSIS — Z992 Dependence on renal dialysis: Secondary | ICD-10-CM | POA: Diagnosis not present

## 2021-02-27 DIAGNOSIS — N186 End stage renal disease: Secondary | ICD-10-CM | POA: Diagnosis not present

## 2021-02-27 DIAGNOSIS — Z992 Dependence on renal dialysis: Secondary | ICD-10-CM | POA: Diagnosis not present

## 2021-02-27 DIAGNOSIS — N2581 Secondary hyperparathyroidism of renal origin: Secondary | ICD-10-CM | POA: Diagnosis not present

## 2021-03-02 DIAGNOSIS — N2581 Secondary hyperparathyroidism of renal origin: Secondary | ICD-10-CM | POA: Diagnosis not present

## 2021-03-02 DIAGNOSIS — Z992 Dependence on renal dialysis: Secondary | ICD-10-CM | POA: Diagnosis not present

## 2021-03-02 DIAGNOSIS — N186 End stage renal disease: Secondary | ICD-10-CM | POA: Diagnosis not present

## 2021-03-04 DIAGNOSIS — N186 End stage renal disease: Secondary | ICD-10-CM | POA: Diagnosis not present

## 2021-03-04 DIAGNOSIS — N2581 Secondary hyperparathyroidism of renal origin: Secondary | ICD-10-CM | POA: Diagnosis not present

## 2021-03-04 DIAGNOSIS — Z992 Dependence on renal dialysis: Secondary | ICD-10-CM | POA: Diagnosis not present

## 2021-03-06 DIAGNOSIS — Z992 Dependence on renal dialysis: Secondary | ICD-10-CM | POA: Diagnosis not present

## 2021-03-06 DIAGNOSIS — N186 End stage renal disease: Secondary | ICD-10-CM | POA: Diagnosis not present

## 2021-03-06 DIAGNOSIS — N2581 Secondary hyperparathyroidism of renal origin: Secondary | ICD-10-CM | POA: Diagnosis not present

## 2021-03-09 DIAGNOSIS — Z992 Dependence on renal dialysis: Secondary | ICD-10-CM | POA: Diagnosis not present

## 2021-03-09 DIAGNOSIS — N186 End stage renal disease: Secondary | ICD-10-CM | POA: Diagnosis not present

## 2021-03-09 DIAGNOSIS — N2581 Secondary hyperparathyroidism of renal origin: Secondary | ICD-10-CM | POA: Diagnosis not present

## 2021-03-11 DIAGNOSIS — N186 End stage renal disease: Secondary | ICD-10-CM | POA: Diagnosis not present

## 2021-03-11 DIAGNOSIS — Z992 Dependence on renal dialysis: Secondary | ICD-10-CM | POA: Diagnosis not present

## 2021-03-11 DIAGNOSIS — N2581 Secondary hyperparathyroidism of renal origin: Secondary | ICD-10-CM | POA: Diagnosis not present

## 2021-03-13 DIAGNOSIS — N2581 Secondary hyperparathyroidism of renal origin: Secondary | ICD-10-CM | POA: Diagnosis not present

## 2021-03-13 DIAGNOSIS — Z992 Dependence on renal dialysis: Secondary | ICD-10-CM | POA: Diagnosis not present

## 2021-03-13 DIAGNOSIS — N186 End stage renal disease: Secondary | ICD-10-CM | POA: Diagnosis not present

## 2021-03-14 DIAGNOSIS — Z992 Dependence on renal dialysis: Secondary | ICD-10-CM | POA: Diagnosis not present

## 2021-03-14 DIAGNOSIS — N186 End stage renal disease: Secondary | ICD-10-CM | POA: Diagnosis not present

## 2021-03-14 DIAGNOSIS — I129 Hypertensive chronic kidney disease with stage 1 through stage 4 chronic kidney disease, or unspecified chronic kidney disease: Secondary | ICD-10-CM | POA: Diagnosis not present

## 2021-03-16 DIAGNOSIS — N186 End stage renal disease: Secondary | ICD-10-CM | POA: Diagnosis not present

## 2021-03-16 DIAGNOSIS — Z992 Dependence on renal dialysis: Secondary | ICD-10-CM | POA: Diagnosis not present

## 2021-03-16 DIAGNOSIS — N2581 Secondary hyperparathyroidism of renal origin: Secondary | ICD-10-CM | POA: Diagnosis not present

## 2021-03-18 DIAGNOSIS — N2581 Secondary hyperparathyroidism of renal origin: Secondary | ICD-10-CM | POA: Diagnosis not present

## 2021-03-18 DIAGNOSIS — Z992 Dependence on renal dialysis: Secondary | ICD-10-CM | POA: Diagnosis not present

## 2021-03-18 DIAGNOSIS — N186 End stage renal disease: Secondary | ICD-10-CM | POA: Diagnosis not present

## 2021-03-20 DIAGNOSIS — N186 End stage renal disease: Secondary | ICD-10-CM | POA: Diagnosis not present

## 2021-03-20 DIAGNOSIS — N2581 Secondary hyperparathyroidism of renal origin: Secondary | ICD-10-CM | POA: Diagnosis not present

## 2021-03-20 DIAGNOSIS — Z992 Dependence on renal dialysis: Secondary | ICD-10-CM | POA: Diagnosis not present

## 2021-03-23 DIAGNOSIS — N2581 Secondary hyperparathyroidism of renal origin: Secondary | ICD-10-CM | POA: Diagnosis not present

## 2021-03-23 DIAGNOSIS — N186 End stage renal disease: Secondary | ICD-10-CM | POA: Diagnosis not present

## 2021-03-23 DIAGNOSIS — Z992 Dependence on renal dialysis: Secondary | ICD-10-CM | POA: Diagnosis not present

## 2021-03-25 DIAGNOSIS — Z992 Dependence on renal dialysis: Secondary | ICD-10-CM | POA: Diagnosis not present

## 2021-03-25 DIAGNOSIS — N2581 Secondary hyperparathyroidism of renal origin: Secondary | ICD-10-CM | POA: Diagnosis not present

## 2021-03-25 DIAGNOSIS — N186 End stage renal disease: Secondary | ICD-10-CM | POA: Diagnosis not present

## 2021-03-27 DIAGNOSIS — Z992 Dependence on renal dialysis: Secondary | ICD-10-CM | POA: Diagnosis not present

## 2021-03-27 DIAGNOSIS — N2581 Secondary hyperparathyroidism of renal origin: Secondary | ICD-10-CM | POA: Diagnosis not present

## 2021-03-27 DIAGNOSIS — N186 End stage renal disease: Secondary | ICD-10-CM | POA: Diagnosis not present

## 2021-03-30 DIAGNOSIS — Z992 Dependence on renal dialysis: Secondary | ICD-10-CM | POA: Diagnosis not present

## 2021-03-30 DIAGNOSIS — N2581 Secondary hyperparathyroidism of renal origin: Secondary | ICD-10-CM | POA: Diagnosis not present

## 2021-03-30 DIAGNOSIS — N186 End stage renal disease: Secondary | ICD-10-CM | POA: Diagnosis not present

## 2021-04-01 DIAGNOSIS — N2581 Secondary hyperparathyroidism of renal origin: Secondary | ICD-10-CM | POA: Diagnosis not present

## 2021-04-01 DIAGNOSIS — N186 End stage renal disease: Secondary | ICD-10-CM | POA: Diagnosis not present

## 2021-04-01 DIAGNOSIS — Z992 Dependence on renal dialysis: Secondary | ICD-10-CM | POA: Diagnosis not present

## 2021-04-03 DIAGNOSIS — Z992 Dependence on renal dialysis: Secondary | ICD-10-CM | POA: Diagnosis not present

## 2021-04-03 DIAGNOSIS — N186 End stage renal disease: Secondary | ICD-10-CM | POA: Diagnosis not present

## 2021-04-03 DIAGNOSIS — N2581 Secondary hyperparathyroidism of renal origin: Secondary | ICD-10-CM | POA: Diagnosis not present

## 2021-04-06 DIAGNOSIS — Z992 Dependence on renal dialysis: Secondary | ICD-10-CM | POA: Diagnosis not present

## 2021-04-06 DIAGNOSIS — N186 End stage renal disease: Secondary | ICD-10-CM | POA: Diagnosis not present

## 2021-04-06 DIAGNOSIS — N2581 Secondary hyperparathyroidism of renal origin: Secondary | ICD-10-CM | POA: Diagnosis not present

## 2021-04-08 DIAGNOSIS — Z992 Dependence on renal dialysis: Secondary | ICD-10-CM | POA: Diagnosis not present

## 2021-04-08 DIAGNOSIS — N186 End stage renal disease: Secondary | ICD-10-CM | POA: Diagnosis not present

## 2021-04-08 DIAGNOSIS — N2581 Secondary hyperparathyroidism of renal origin: Secondary | ICD-10-CM | POA: Diagnosis not present

## 2021-04-09 DIAGNOSIS — N186 End stage renal disease: Secondary | ICD-10-CM | POA: Diagnosis not present

## 2021-04-09 DIAGNOSIS — I871 Compression of vein: Secondary | ICD-10-CM | POA: Diagnosis not present

## 2021-04-09 DIAGNOSIS — T82858A Stenosis of vascular prosthetic devices, implants and grafts, initial encounter: Secondary | ICD-10-CM | POA: Diagnosis not present

## 2021-04-09 DIAGNOSIS — Z992 Dependence on renal dialysis: Secondary | ICD-10-CM | POA: Diagnosis not present

## 2021-04-10 DIAGNOSIS — Z992 Dependence on renal dialysis: Secondary | ICD-10-CM | POA: Diagnosis not present

## 2021-04-10 DIAGNOSIS — N186 End stage renal disease: Secondary | ICD-10-CM | POA: Diagnosis not present

## 2021-04-10 DIAGNOSIS — N2581 Secondary hyperparathyroidism of renal origin: Secondary | ICD-10-CM | POA: Diagnosis not present

## 2021-04-13 DIAGNOSIS — Z992 Dependence on renal dialysis: Secondary | ICD-10-CM | POA: Diagnosis not present

## 2021-04-13 DIAGNOSIS — N186 End stage renal disease: Secondary | ICD-10-CM | POA: Diagnosis not present

## 2021-04-13 DIAGNOSIS — N2581 Secondary hyperparathyroidism of renal origin: Secondary | ICD-10-CM | POA: Diagnosis not present

## 2021-04-14 DIAGNOSIS — N186 End stage renal disease: Secondary | ICD-10-CM | POA: Diagnosis not present

## 2021-04-14 DIAGNOSIS — Z992 Dependence on renal dialysis: Secondary | ICD-10-CM | POA: Diagnosis not present

## 2021-04-14 DIAGNOSIS — I129 Hypertensive chronic kidney disease with stage 1 through stage 4 chronic kidney disease, or unspecified chronic kidney disease: Secondary | ICD-10-CM | POA: Diagnosis not present

## 2021-04-15 DIAGNOSIS — Z992 Dependence on renal dialysis: Secondary | ICD-10-CM | POA: Diagnosis not present

## 2021-04-15 DIAGNOSIS — N2581 Secondary hyperparathyroidism of renal origin: Secondary | ICD-10-CM | POA: Diagnosis not present

## 2021-04-15 DIAGNOSIS — N186 End stage renal disease: Secondary | ICD-10-CM | POA: Diagnosis not present

## 2021-04-16 ENCOUNTER — Telehealth: Payer: Self-pay | Admitting: Family Medicine

## 2021-04-16 NOTE — Telephone Encounter (Signed)
Copied from [place] [id number]. Topic: Medicare AWV >> Apr 16, 2021 10:34 AM Cher Nakai R wrote: Reason for CRM:  Left message for patient to call back and schedule Medicare Annual Wellness Visit (AWV) in office.   If unable to come into the office for AWV,  please offer to do virtually or by telephone.  No hx of AWV eligible for AWVI as of 03/14/2016 per [place]  Please schedule at anytime with [facility] Health Advisor.      40 Minutes appointment   Any questions, please call me at [phone]

## 2021-04-17 DIAGNOSIS — N2581 Secondary hyperparathyroidism of renal origin: Secondary | ICD-10-CM | POA: Diagnosis not present

## 2021-04-17 DIAGNOSIS — N186 End stage renal disease: Secondary | ICD-10-CM | POA: Diagnosis not present

## 2021-04-17 DIAGNOSIS — Z992 Dependence on renal dialysis: Secondary | ICD-10-CM | POA: Diagnosis not present

## 2021-04-20 DIAGNOSIS — Z992 Dependence on renal dialysis: Secondary | ICD-10-CM | POA: Diagnosis not present

## 2021-04-20 DIAGNOSIS — N186 End stage renal disease: Secondary | ICD-10-CM | POA: Diagnosis not present

## 2021-04-20 DIAGNOSIS — N2581 Secondary hyperparathyroidism of renal origin: Secondary | ICD-10-CM | POA: Diagnosis not present

## 2021-04-22 DIAGNOSIS — N186 End stage renal disease: Secondary | ICD-10-CM | POA: Diagnosis not present

## 2021-04-22 DIAGNOSIS — N2581 Secondary hyperparathyroidism of renal origin: Secondary | ICD-10-CM | POA: Diagnosis not present

## 2021-04-22 DIAGNOSIS — Z992 Dependence on renal dialysis: Secondary | ICD-10-CM | POA: Diagnosis not present

## 2021-04-24 DIAGNOSIS — N186 End stage renal disease: Secondary | ICD-10-CM | POA: Diagnosis not present

## 2021-04-24 DIAGNOSIS — Z992 Dependence on renal dialysis: Secondary | ICD-10-CM | POA: Diagnosis not present

## 2021-04-24 DIAGNOSIS — N2581 Secondary hyperparathyroidism of renal origin: Secondary | ICD-10-CM | POA: Diagnosis not present

## 2021-04-27 DIAGNOSIS — N186 End stage renal disease: Secondary | ICD-10-CM | POA: Diagnosis not present

## 2021-04-27 DIAGNOSIS — Z992 Dependence on renal dialysis: Secondary | ICD-10-CM | POA: Diagnosis not present

## 2021-04-27 DIAGNOSIS — N2581 Secondary hyperparathyroidism of renal origin: Secondary | ICD-10-CM | POA: Diagnosis not present

## 2021-04-29 DIAGNOSIS — Z992 Dependence on renal dialysis: Secondary | ICD-10-CM | POA: Diagnosis not present

## 2021-04-29 DIAGNOSIS — N2581 Secondary hyperparathyroidism of renal origin: Secondary | ICD-10-CM | POA: Diagnosis not present

## 2021-04-29 DIAGNOSIS — N186 End stage renal disease: Secondary | ICD-10-CM | POA: Diagnosis not present

## 2021-05-01 DIAGNOSIS — Z992 Dependence on renal dialysis: Secondary | ICD-10-CM | POA: Diagnosis not present

## 2021-05-01 DIAGNOSIS — N2581 Secondary hyperparathyroidism of renal origin: Secondary | ICD-10-CM | POA: Diagnosis not present

## 2021-05-01 DIAGNOSIS — N186 End stage renal disease: Secondary | ICD-10-CM | POA: Diagnosis not present

## 2021-05-04 DIAGNOSIS — Z992 Dependence on renal dialysis: Secondary | ICD-10-CM | POA: Diagnosis not present

## 2021-05-04 DIAGNOSIS — N186 End stage renal disease: Secondary | ICD-10-CM | POA: Diagnosis not present

## 2021-05-04 DIAGNOSIS — N2581 Secondary hyperparathyroidism of renal origin: Secondary | ICD-10-CM | POA: Diagnosis not present

## 2021-05-05 ENCOUNTER — Other Ambulatory Visit: Payer: Self-pay

## 2021-05-05 ENCOUNTER — Ambulatory Visit (INDEPENDENT_AMBULATORY_CARE_PROVIDER_SITE_OTHER): Payer: Medicare Other | Admitting: Podiatry

## 2021-05-05 DIAGNOSIS — Z992 Dependence on renal dialysis: Secondary | ICD-10-CM

## 2021-05-05 DIAGNOSIS — D631 Anemia in chronic kidney disease: Secondary | ICD-10-CM | POA: Diagnosis not present

## 2021-05-05 DIAGNOSIS — Q828 Other specified congenital malformations of skin: Secondary | ICD-10-CM

## 2021-05-05 DIAGNOSIS — B351 Tinea unguium: Secondary | ICD-10-CM | POA: Diagnosis not present

## 2021-05-05 DIAGNOSIS — N186 End stage renal disease: Secondary | ICD-10-CM | POA: Diagnosis not present

## 2021-05-05 DIAGNOSIS — M79674 Pain in right toe(s): Secondary | ICD-10-CM

## 2021-05-05 DIAGNOSIS — L84 Corns and callosities: Secondary | ICD-10-CM

## 2021-05-05 DIAGNOSIS — M79675 Pain in left toe(s): Secondary | ICD-10-CM

## 2021-05-06 DIAGNOSIS — N2581 Secondary hyperparathyroidism of renal origin: Secondary | ICD-10-CM | POA: Diagnosis not present

## 2021-05-06 DIAGNOSIS — N186 End stage renal disease: Secondary | ICD-10-CM | POA: Diagnosis not present

## 2021-05-06 DIAGNOSIS — Z992 Dependence on renal dialysis: Secondary | ICD-10-CM | POA: Diagnosis not present

## 2021-05-08 DIAGNOSIS — N186 End stage renal disease: Secondary | ICD-10-CM | POA: Diagnosis not present

## 2021-05-08 DIAGNOSIS — Z992 Dependence on renal dialysis: Secondary | ICD-10-CM | POA: Diagnosis not present

## 2021-05-08 DIAGNOSIS — N2581 Secondary hyperparathyroidism of renal origin: Secondary | ICD-10-CM | POA: Diagnosis not present

## 2021-05-11 ENCOUNTER — Encounter: Payer: Self-pay | Admitting: Podiatry

## 2021-05-11 DIAGNOSIS — Z992 Dependence on renal dialysis: Secondary | ICD-10-CM | POA: Diagnosis not present

## 2021-05-11 DIAGNOSIS — N186 End stage renal disease: Secondary | ICD-10-CM | POA: Diagnosis not present

## 2021-05-11 DIAGNOSIS — N2581 Secondary hyperparathyroidism of renal origin: Secondary | ICD-10-CM | POA: Diagnosis not present

## 2021-05-11 NOTE — Progress Notes (Signed)
°  Subjective:  Patient ID: Dwayne Reed, male    DOB: 1955-11-01,  MRN: [id number]  66 y.o. male presents with at risk foot care with h/o NIDDM with ESRD on hemodialysis, corn(s) right 2nd toe , callus(es) bilaterally, painful porokeratotic lesions right foot.  Pain prevents comfortable ambulation. Aggravating factor is weightbearing with or without shoegear..  and painful mycotic nails.  Pain interferes with ambulation. Aggravating factors include wearing enclosed shoe gear. Painful toenails interfere with ambulation. Aggravating factors include wearing enclosed shoe gear. Pain is relieved with periodic professional debridement. Painful corns and calluses are aggravated when weightbearing with and without shoegear. Pain is relieved with periodic professional debridement.,   New problem(s): None    PCP: Juline Patch, MD and last visit was:October 20, 2020.  Review of Systems: Negative except as noted in the HPI.   Allergies  Allergen Reactions   Sensipar [Cinacalcet]     Other reaction(s): Unknown    Objective:  There were no vitals filed for this visit. Constitutional Patient is a pleasant 66 y.o. African American male WD, WN in NAD. AAO x 3.  Vascular CFT <3 seconds b/l LE. Palpable pedal pulses b/l LE. Pedal hair absent. No pain with calf compression b/l. Lower extremity skin temperature gradient within normal limits. No edema noted b/l LE. No cyanosis or clubbing noted b/l LE.  Neurologic Protective sensation intact 5/5 intact bilaterally with 10g monofilament b/l. Vibratory sensation intact b/l.  Dermatologic Pedal skin is warm and supple b/l.  No open wounds b/l lower extremities. No interdigital macerations b/l lower extremities. Toenails 1-5 b/l elongated, discolored, dystrophic, thickened, crumbly with subungual debris and tenderness to dorsal palpation. Hyperkeratotic lesion(s) R 2nd toe, submet head 1 left foot, and submet head 5 right foot.  No erythema, no edema, no drainage, no  fluctuance. Porokeratotic lesion(s) submet head 2 right foot. No erythema, no edema, no drainage, no fluctuance.  Orthopedic: Normal muscle strength 5/5 to all lower extremity muscle groups bilaterally. Patient ambulates independent of any assistive aids. HAV with bunion deformity noted b/l LE. Hammertoe deformity noted 2-5 b/l.    Assessment:   1. Pain due to onychomycosis of toenails of both feet   2. Corns and callosities   3. Porokeratosis   4. Anemia in ESRD (end-stage renal disease) ([place])   5. ESRD on dialysis [facility])    Plan:  Patient was evaluated and treated and all questions answered. Consent given for treatment as described below: -Toenails 1-5 b/l were debrided in length and girth with sterile nail nippers and dremel without iatrogenic bleeding.  -Corn(s) R 2nd toe and callus(es) submet head 1 left foot and submet head 5 right foot were pared utilizing sterile scalpel blade without incident. Total number debrided =3. -Painful porokeratotic lesion(s) submet head 2 right foot pared and enucleated with sterile scalpel blade without incident. Total number of lesions debrided=1. -Patient/POA to call should there be question/concern in the interim.  Return in about 3 months (around 08/02/2021).  [name], DPM

## 2021-05-12 DIAGNOSIS — Z992 Dependence on renal dialysis: Secondary | ICD-10-CM | POA: Diagnosis not present

## 2021-05-12 DIAGNOSIS — I129 Hypertensive chronic kidney disease with stage 1 through stage 4 chronic kidney disease, or unspecified chronic kidney disease: Secondary | ICD-10-CM | POA: Diagnosis not present

## 2021-05-12 DIAGNOSIS — N186 End stage renal disease: Secondary | ICD-10-CM | POA: Diagnosis not present

## 2021-05-13 DIAGNOSIS — Z992 Dependence on renal dialysis: Secondary | ICD-10-CM | POA: Diagnosis not present

## 2021-05-13 DIAGNOSIS — N186 End stage renal disease: Secondary | ICD-10-CM | POA: Diagnosis not present

## 2021-05-13 DIAGNOSIS — N2581 Secondary hyperparathyroidism of renal origin: Secondary | ICD-10-CM | POA: Diagnosis not present

## 2021-05-15 DIAGNOSIS — Z992 Dependence on renal dialysis: Secondary | ICD-10-CM | POA: Diagnosis not present

## 2021-05-15 DIAGNOSIS — N186 End stage renal disease: Secondary | ICD-10-CM | POA: Diagnosis not present

## 2021-05-15 DIAGNOSIS — N2581 Secondary hyperparathyroidism of renal origin: Secondary | ICD-10-CM | POA: Diagnosis not present

## 2021-05-18 DIAGNOSIS — Z992 Dependence on renal dialysis: Secondary | ICD-10-CM | POA: Diagnosis not present

## 2021-05-18 DIAGNOSIS — N186 End stage renal disease: Secondary | ICD-10-CM | POA: Diagnosis not present

## 2021-05-18 DIAGNOSIS — N2581 Secondary hyperparathyroidism of renal origin: Secondary | ICD-10-CM | POA: Diagnosis not present

## 2021-05-20 DIAGNOSIS — Z992 Dependence on renal dialysis: Secondary | ICD-10-CM | POA: Diagnosis not present

## 2021-05-20 DIAGNOSIS — N2581 Secondary hyperparathyroidism of renal origin: Secondary | ICD-10-CM | POA: Diagnosis not present

## 2021-05-20 DIAGNOSIS — N186 End stage renal disease: Secondary | ICD-10-CM | POA: Diagnosis not present

## 2021-05-22 DIAGNOSIS — N186 End stage renal disease: Secondary | ICD-10-CM | POA: Diagnosis not present

## 2021-05-22 DIAGNOSIS — Z992 Dependence on renal dialysis: Secondary | ICD-10-CM | POA: Diagnosis not present

## 2021-05-22 DIAGNOSIS — N2581 Secondary hyperparathyroidism of renal origin: Secondary | ICD-10-CM | POA: Diagnosis not present

## 2021-05-25 DIAGNOSIS — Z992 Dependence on renal dialysis: Secondary | ICD-10-CM | POA: Diagnosis not present

## 2021-05-25 DIAGNOSIS — N2581 Secondary hyperparathyroidism of renal origin: Secondary | ICD-10-CM | POA: Diagnosis not present

## 2021-05-25 DIAGNOSIS — N186 End stage renal disease: Secondary | ICD-10-CM | POA: Diagnosis not present

## 2021-05-27 DIAGNOSIS — N186 End stage renal disease: Secondary | ICD-10-CM | POA: Diagnosis not present

## 2021-05-27 DIAGNOSIS — Z992 Dependence on renal dialysis: Secondary | ICD-10-CM | POA: Diagnosis not present

## 2021-05-27 DIAGNOSIS — N2581 Secondary hyperparathyroidism of renal origin: Secondary | ICD-10-CM | POA: Diagnosis not present

## 2021-05-29 DIAGNOSIS — N2581 Secondary hyperparathyroidism of renal origin: Secondary | ICD-10-CM | POA: Diagnosis not present

## 2021-05-29 DIAGNOSIS — Z992 Dependence on renal dialysis: Secondary | ICD-10-CM | POA: Diagnosis not present

## 2021-05-29 DIAGNOSIS — N186 End stage renal disease: Secondary | ICD-10-CM | POA: Diagnosis not present

## 2021-06-01 DIAGNOSIS — Z992 Dependence on renal dialysis: Secondary | ICD-10-CM | POA: Diagnosis not present

## 2021-06-01 DIAGNOSIS — N186 End stage renal disease: Secondary | ICD-10-CM | POA: Diagnosis not present

## 2021-06-01 DIAGNOSIS — N2581 Secondary hyperparathyroidism of renal origin: Secondary | ICD-10-CM | POA: Diagnosis not present

## 2021-06-03 DIAGNOSIS — N2581 Secondary hyperparathyroidism of renal origin: Secondary | ICD-10-CM | POA: Diagnosis not present

## 2021-06-03 DIAGNOSIS — N186 End stage renal disease: Secondary | ICD-10-CM | POA: Diagnosis not present

## 2021-06-03 DIAGNOSIS — Z992 Dependence on renal dialysis: Secondary | ICD-10-CM | POA: Diagnosis not present

## 2021-06-05 DIAGNOSIS — Z992 Dependence on renal dialysis: Secondary | ICD-10-CM | POA: Diagnosis not present

## 2021-06-05 DIAGNOSIS — N2581 Secondary hyperparathyroidism of renal origin: Secondary | ICD-10-CM | POA: Diagnosis not present

## 2021-06-05 DIAGNOSIS — N186 End stage renal disease: Secondary | ICD-10-CM | POA: Diagnosis not present

## 2021-06-08 DIAGNOSIS — N186 End stage renal disease: Secondary | ICD-10-CM | POA: Diagnosis not present

## 2021-06-08 DIAGNOSIS — Z992 Dependence on renal dialysis: Secondary | ICD-10-CM | POA: Diagnosis not present

## 2021-06-08 DIAGNOSIS — N2581 Secondary hyperparathyroidism of renal origin: Secondary | ICD-10-CM | POA: Diagnosis not present

## 2021-06-10 DIAGNOSIS — N186 End stage renal disease: Secondary | ICD-10-CM | POA: Diagnosis not present

## 2021-06-10 DIAGNOSIS — Z992 Dependence on renal dialysis: Secondary | ICD-10-CM | POA: Diagnosis not present

## 2021-06-10 DIAGNOSIS — N2581 Secondary hyperparathyroidism of renal origin: Secondary | ICD-10-CM | POA: Diagnosis not present

## 2021-06-12 DIAGNOSIS — I129 Hypertensive chronic kidney disease with stage 1 through stage 4 chronic kidney disease, or unspecified chronic kidney disease: Secondary | ICD-10-CM | POA: Diagnosis not present

## 2021-06-12 DIAGNOSIS — Z992 Dependence on renal dialysis: Secondary | ICD-10-CM | POA: Diagnosis not present

## 2021-06-12 DIAGNOSIS — N2581 Secondary hyperparathyroidism of renal origin: Secondary | ICD-10-CM | POA: Diagnosis not present

## 2021-06-12 DIAGNOSIS — N186 End stage renal disease: Secondary | ICD-10-CM | POA: Diagnosis not present

## 2021-06-15 DIAGNOSIS — N186 End stage renal disease: Secondary | ICD-10-CM | POA: Diagnosis not present

## 2021-06-15 DIAGNOSIS — N2581 Secondary hyperparathyroidism of renal origin: Secondary | ICD-10-CM | POA: Diagnosis not present

## 2021-06-15 DIAGNOSIS — Z992 Dependence on renal dialysis: Secondary | ICD-10-CM | POA: Diagnosis not present

## 2021-06-17 DIAGNOSIS — Z992 Dependence on renal dialysis: Secondary | ICD-10-CM | POA: Diagnosis not present

## 2021-06-17 DIAGNOSIS — N2581 Secondary hyperparathyroidism of renal origin: Secondary | ICD-10-CM | POA: Diagnosis not present

## 2021-06-17 DIAGNOSIS — N186 End stage renal disease: Secondary | ICD-10-CM | POA: Diagnosis not present

## 2021-06-19 DIAGNOSIS — N2581 Secondary hyperparathyroidism of renal origin: Secondary | ICD-10-CM | POA: Diagnosis not present

## 2021-06-19 DIAGNOSIS — N186 End stage renal disease: Secondary | ICD-10-CM | POA: Diagnosis not present

## 2021-06-19 DIAGNOSIS — Z992 Dependence on renal dialysis: Secondary | ICD-10-CM | POA: Diagnosis not present

## 2021-06-22 DIAGNOSIS — N2581 Secondary hyperparathyroidism of renal origin: Secondary | ICD-10-CM | POA: Diagnosis not present

## 2021-06-22 DIAGNOSIS — Z992 Dependence on renal dialysis: Secondary | ICD-10-CM | POA: Diagnosis not present

## 2021-06-22 DIAGNOSIS — N186 End stage renal disease: Secondary | ICD-10-CM | POA: Diagnosis not present

## 2021-06-24 DIAGNOSIS — N186 End stage renal disease: Secondary | ICD-10-CM | POA: Diagnosis not present

## 2021-06-24 DIAGNOSIS — N2581 Secondary hyperparathyroidism of renal origin: Secondary | ICD-10-CM | POA: Diagnosis not present

## 2021-06-24 DIAGNOSIS — Z992 Dependence on renal dialysis: Secondary | ICD-10-CM | POA: Diagnosis not present

## 2021-06-26 DIAGNOSIS — N2581 Secondary hyperparathyroidism of renal origin: Secondary | ICD-10-CM | POA: Diagnosis not present

## 2021-06-26 DIAGNOSIS — Z992 Dependence on renal dialysis: Secondary | ICD-10-CM | POA: Diagnosis not present

## 2021-06-26 DIAGNOSIS — N186 End stage renal disease: Secondary | ICD-10-CM | POA: Diagnosis not present

## 2021-06-29 DIAGNOSIS — N186 End stage renal disease: Secondary | ICD-10-CM | POA: Diagnosis not present

## 2021-06-29 DIAGNOSIS — N2581 Secondary hyperparathyroidism of renal origin: Secondary | ICD-10-CM | POA: Diagnosis not present

## 2021-06-29 DIAGNOSIS — Z992 Dependence on renal dialysis: Secondary | ICD-10-CM | POA: Diagnosis not present

## 2021-07-01 DIAGNOSIS — Z992 Dependence on renal dialysis: Secondary | ICD-10-CM | POA: Diagnosis not present

## 2021-07-01 DIAGNOSIS — N2581 Secondary hyperparathyroidism of renal origin: Secondary | ICD-10-CM | POA: Diagnosis not present

## 2021-07-01 DIAGNOSIS — N186 End stage renal disease: Secondary | ICD-10-CM | POA: Diagnosis not present

## 2021-07-03 DIAGNOSIS — Z992 Dependence on renal dialysis: Secondary | ICD-10-CM | POA: Diagnosis not present

## 2021-07-03 DIAGNOSIS — N2581 Secondary hyperparathyroidism of renal origin: Secondary | ICD-10-CM | POA: Diagnosis not present

## 2021-07-03 DIAGNOSIS — N186 End stage renal disease: Secondary | ICD-10-CM | POA: Diagnosis not present

## 2021-07-05 ENCOUNTER — Emergency Department
Admission: EM | Admit: 2021-07-05 | Discharge: 2021-07-05 | Disposition: A | Discharge status: Home / Self Care | Payer: Medicare Other | Attending: Emergency Medicine | Admitting: Emergency Medicine

## 2021-07-05 ENCOUNTER — Emergency Department: Payer: Medicare Other

## 2021-07-05 ENCOUNTER — Other Ambulatory Visit: Payer: Self-pay

## 2021-07-05 ENCOUNTER — Encounter: Payer: Self-pay | Admitting: Emergency Medicine

## 2021-07-05 DIAGNOSIS — J069 Acute upper respiratory infection, unspecified: Secondary | ICD-10-CM | POA: Insufficient documentation

## 2021-07-05 DIAGNOSIS — J449 Chronic obstructive pulmonary disease, unspecified: Secondary | ICD-10-CM | POA: Diagnosis not present

## 2021-07-05 DIAGNOSIS — I10 Essential (primary) hypertension: Secondary | ICD-10-CM | POA: Insufficient documentation

## 2021-07-05 DIAGNOSIS — R059 Cough, unspecified: Secondary | ICD-10-CM | POA: Diagnosis not present

## 2021-07-05 DIAGNOSIS — B9789 Other viral agents as the cause of diseases classified elsewhere: Secondary | ICD-10-CM | POA: Diagnosis not present

## 2021-07-05 MED ORDER — GUAIFENESIN-CODEINE 100-10 MG/5ML PO SOLN: 5.0000 mL | Freq: Four times a day (QID) | ORAL | 0 refills | Status: DC | PRN

## 2021-07-05 NOTE — ED Triage Notes (Signed)
Pt via POV from home. Pt c/o cough, congestion for the past 3 days. Denies fever. Denies SOB. Pt is a dialysis pt but denies missing any treatment. Pt is A&Ox4 and NAD ?

## 2021-07-05 NOTE — Discharge Instructions (Addendum)
Follow-up with your primary care provider if any continued problems or concerns.  You may take Tylenol as needed for body aches or headache.  A prescription for guaifenesin with codeine was sent to your pharmacy.  This is a medication for congestion and also cough.  It contains a narcotic which may make you drowsy.  Do not drive or operate machinery while taking this medication.  Drink lots of fluids to stay hydrated. ?

## 2021-07-05 NOTE — ED Provider Notes (Signed)
? ?[facility] ?Provider Note ? ? ? Event Date/Time  ? First MD Initiated Contact with Patient 07/05/21 [phone]   ?  (approximate) ? ? ?History  ? ?Cough and Nasal Congestion ? ? ?HPI ? ?Dwayne Reed is a 66 y.o. male   presents to the ED with complaint of cough and congestion for the last 3 days.  Patient denies any fever, chills, nausea or vomiting.  He also denies any diarrhea or sore throat.  No known exposure to COVID or influenza.  Patient reports that he did get the COVID vaccine.  Patient has history of COPD, hypertension, hyperparathyroidism and dialysis patient. ? ?  ? ? ?Physical Exam  ? ?Triage Vital Signs: ?ED Triage Vitals [07/05/21 0918]  ?Enc Vitals Group  ?   BP   ?   Pulse   ?   Resp   ?   Temp   ?   Temp src   ?   SpO2   ?   Weight 185 lb (83.9 kg)  ?   Height 6' (1.829 m)  ?   Head Circumference   ?   Peak Flow   ?   Pain Score 0  ?   Pain Loc   ?   Pain Edu?   ?   Excl. in [place]?   ? ? ?Most recent vital signs: ?Vitals:  ? 07/05/21 0928 07/05/21 1108  ?BP: 101/72 92/65  ?Pulse: 72 74  ?Resp: 16 18  ?Temp: (!) 97.5 ?F (36.4 ?C)   ?SpO2: 96% 100%  ? ? ? ?General: Awake, no distress.  Nontoxic, talkative, cooperative. ?CV:  Good peripheral perfusion.  Heart regular rate and rhythm. ?Resp:  Normal effort.  Lungs are clear bilaterally.  Cough was noted during exam. ?Abd:  No distention.  ?Other:  Minimal nasal congestion. ? ? ?ED Results / Procedures / Treatments  ? ?Labs ?(all labs ordered are listed, but only abnormal results are displayed) ?Labs Reviewed - No data to display ? ? ?RADIOLOGY ?Chest x-ray images were reviewed by myself independently of the radiologist and no pneumonia was noted.  Radiology report is negative for acute cardiopulmonary disease. ? ? ? ?PROCEDURES: ? ?Critical Care performed:  ? ?Procedures ? ? ?MEDICATIONS ORDERED IN ED: ?Medications - No data to display ? ? ?IMPRESSION / MDM / ASSESSMENT AND PLAN / ED COURSE  ?I reviewed the triage vital signs and the  nursing notes. ? ? ?Differential diagnosis includes, but is not limited to, viral upper respiratory infection, bronchitis, pneumonia. ? ?66 year old male presents to the ED with complaint of cough and congestion for the last 3 days.  Patient denies any fever or shortness of breath.  He has in the past had upper respiratory infections and currently he is a dialysis patient.  Physical exam was benign.  Chest x-ray was negative for acute cardiopulmonary disease.  We discussed the significance of his cough and wife states that it is keeping her up at night.  Patient is very cautious about narcotics and states he does not want to become dependent on anything.  He did agree to taking some guaifenesin with codeine 1 teaspoon every 6 hours as needed for cough.  He states that he will stop at 4-1/2 days as he still is not going to be on anything addictive. ? ? ?FINAL CLINICAL IMPRESSION(S) / ED DIAGNOSES  ? ?Final diagnoses:  ?Viral URI with cough  ? ? ? ?Rx / DC Orders  ? ?ED Discharge Orders   ? ?  Ordered  ?  guaiFENesin-codeine 100-10 MG/5ML syrup  Every 6 hours PRN       ? 07/05/21 1100  ? ?  ?  ? ?  ? ? ? ?Note:  This document was prepared using Dragon voice recognition software and may include unintentional dictation errors. ?  ?Johnn Hai, PA-C ?07/05/21 1115 ? ?  ?Vanessa Kiowa, MD ?07/09/21 1517 ? ?

## 2021-07-06 DIAGNOSIS — N2581 Secondary hyperparathyroidism of renal origin: Secondary | ICD-10-CM | POA: Diagnosis not present

## 2021-07-06 DIAGNOSIS — Z992 Dependence on renal dialysis: Secondary | ICD-10-CM | POA: Diagnosis not present

## 2021-07-06 DIAGNOSIS — N186 End stage renal disease: Secondary | ICD-10-CM | POA: Diagnosis not present

## 2021-07-08 DIAGNOSIS — Z992 Dependence on renal dialysis: Secondary | ICD-10-CM | POA: Diagnosis not present

## 2021-07-08 DIAGNOSIS — N186 End stage renal disease: Secondary | ICD-10-CM | POA: Diagnosis not present

## 2021-07-08 DIAGNOSIS — N2581 Secondary hyperparathyroidism of renal origin: Secondary | ICD-10-CM | POA: Diagnosis not present

## 2021-07-10 DIAGNOSIS — N186 End stage renal disease: Secondary | ICD-10-CM | POA: Diagnosis not present

## 2021-07-10 DIAGNOSIS — N2581 Secondary hyperparathyroidism of renal origin: Secondary | ICD-10-CM | POA: Diagnosis not present

## 2021-07-10 DIAGNOSIS — Z992 Dependence on renal dialysis: Secondary | ICD-10-CM | POA: Diagnosis not present

## 2021-07-12 DIAGNOSIS — I129 Hypertensive chronic kidney disease with stage 1 through stage 4 chronic kidney disease, or unspecified chronic kidney disease: Secondary | ICD-10-CM | POA: Diagnosis not present

## 2021-07-12 DIAGNOSIS — Z992 Dependence on renal dialysis: Secondary | ICD-10-CM | POA: Diagnosis not present

## 2021-07-12 DIAGNOSIS — N186 End stage renal disease: Secondary | ICD-10-CM | POA: Diagnosis not present

## 2021-07-13 DIAGNOSIS — N186 End stage renal disease: Secondary | ICD-10-CM | POA: Diagnosis not present

## 2021-07-13 DIAGNOSIS — Z992 Dependence on renal dialysis: Secondary | ICD-10-CM | POA: Diagnosis not present

## 2021-07-13 DIAGNOSIS — N2581 Secondary hyperparathyroidism of renal origin: Secondary | ICD-10-CM | POA: Diagnosis not present

## 2021-07-15 DIAGNOSIS — Z992 Dependence on renal dialysis: Secondary | ICD-10-CM | POA: Diagnosis not present

## 2021-07-15 DIAGNOSIS — Z20822 Contact with and (suspected) exposure to covid-19: Secondary | ICD-10-CM | POA: Diagnosis not present

## 2021-07-15 DIAGNOSIS — N2581 Secondary hyperparathyroidism of renal origin: Secondary | ICD-10-CM | POA: Diagnosis not present

## 2021-07-15 DIAGNOSIS — N186 End stage renal disease: Secondary | ICD-10-CM | POA: Diagnosis not present

## 2021-07-17 DIAGNOSIS — Z992 Dependence on renal dialysis: Secondary | ICD-10-CM | POA: Diagnosis not present

## 2021-07-17 DIAGNOSIS — N2581 Secondary hyperparathyroidism of renal origin: Secondary | ICD-10-CM | POA: Diagnosis not present

## 2021-07-17 DIAGNOSIS — N186 End stage renal disease: Secondary | ICD-10-CM | POA: Diagnosis not present

## 2021-07-20 ENCOUNTER — Encounter: Payer: Self-pay | Admitting: Family Medicine

## 2021-07-20 ENCOUNTER — Ambulatory Visit (INDEPENDENT_AMBULATORY_CARE_PROVIDER_SITE_OTHER): Payer: Medicare Other | Admitting: Family Medicine

## 2021-07-20 ENCOUNTER — Ambulatory Visit (INDEPENDENT_AMBULATORY_CARE_PROVIDER_SITE_OTHER): Payer: Medicare Other | Admitting: Urology

## 2021-07-20 ENCOUNTER — Ambulatory Visit
Admission: RE | Admit: 2021-07-20 | Discharge: 2021-07-20 | Disposition: A | Discharge status: Home / Self Care | Payer: Medicare Other | Source: Ambulatory Visit | Attending: Urology | Admitting: Urology

## 2021-07-20 ENCOUNTER — Telehealth: Payer: Self-pay | Admitting: Urology

## 2021-07-20 VITALS — BP 122/80 | HR 86 | Ht 72.0 in | Wt 176.0 lb

## 2021-07-20 VITALS — BP 96/60 | HR 86 | Ht 72.0 in | Wt 176.0 lb

## 2021-07-20 DIAGNOSIS — I10 Essential (primary) hypertension: Secondary | ICD-10-CM

## 2021-07-20 DIAGNOSIS — N3943 Post-void dribbling: Secondary | ICD-10-CM

## 2021-07-20 DIAGNOSIS — N200 Calculus of kidney: Secondary | ICD-10-CM | POA: Insufficient documentation

## 2021-07-20 DIAGNOSIS — R339 Retention of urine, unspecified: Secondary | ICD-10-CM | POA: Diagnosis not present

## 2021-07-20 DIAGNOSIS — Z91199 Patient's noncompliance with other medical treatment and regimen due to unspecified reason: Secondary | ICD-10-CM

## 2021-07-20 DIAGNOSIS — N186 End stage renal disease: Secondary | ICD-10-CM

## 2021-07-20 DIAGNOSIS — R34 Anuria and oliguria: Secondary | ICD-10-CM | POA: Diagnosis not present

## 2021-07-20 DIAGNOSIS — Z992 Dependence on renal dialysis: Secondary | ICD-10-CM | POA: Diagnosis not present

## 2021-07-20 DIAGNOSIS — Z8546 Personal history of malignant neoplasm of prostate: Secondary | ICD-10-CM

## 2021-07-20 DIAGNOSIS — N401 Enlarged prostate with lower urinary tract symptoms: Secondary | ICD-10-CM | POA: Diagnosis not present

## 2021-07-20 DIAGNOSIS — K625 Hemorrhage of anus and rectum: Secondary | ICD-10-CM | POA: Diagnosis not present

## 2021-07-20 DIAGNOSIS — C61 Malignant neoplasm of prostate: Secondary | ICD-10-CM

## 2021-07-20 DIAGNOSIS — R309 Painful micturition, unspecified: Secondary | ICD-10-CM | POA: Diagnosis not present

## 2021-07-20 DIAGNOSIS — N2581 Secondary hyperparathyroidism of renal origin: Secondary | ICD-10-CM | POA: Diagnosis not present

## 2021-07-20 LAB — BLADDER SCAN AMB NON-IMAGING

## 2021-07-20 NOTE — Telephone Encounter (Signed)
Please let Mr. Riel know that it looks like he has stones in his right kidney and we will get a CT scan for further evaluation.  I have placed orders, so the CT folks will call him to schedule.  ?

## 2021-07-20 NOTE — Progress Notes (Signed)
? ? ?Date:  07/20/2021  ? ?Name:  Dwayne Reed   DOB:  [birth year]   MRN:  [id number] ? ? ?Chief Complaint: trouble urinating (Never took tamsulosin that was prescribed to him 3 years ago.) and Blood In Stools ? ?Male GU Problem ?The patient's pertinent negatives include no genital injury, genital itching, genital lesions, pelvic pain, penile discharge, penile pain, priapism, scrotal swelling or testicular pain. This is a chronic problem. The current episode started in the past 7 days. The problem has been waxing and waning. The pain is moderate. Associated symptoms include abdominal pain, discolored urine and hesitancy. Pertinent negatives include no anorexia, chest pain, chills, constipation, coughing, diarrhea, dysuria, fever, flank pain, frequency, headaches, hematuria, joint pain, joint swelling, nausea, painful intercourse, rash, shortness of breath, sore throat, urgency, urinary retention or vomiting. Associated symptoms comments: suprapubic.  ?Rectal Bleeding  ?The current episode started more than 1 week ago (6 weeks). The onset was sudden. The problem occurs frequently. Progression since onset: "sporatic" The pain is mild (suprapubic pain). The stool is described as soft. Associated symptoms include abdominal pain. Pertinent negatives include no anorexia, no fever, no diarrhea, no hemorrhoids, no nausea, no rectal pain, no vomiting, no hematuria, no chest pain, no headaches, no coughing and no rash.  ? ?Lab Results  ?Component Value Date  ? NA 141 10/20/2020  ? K 4.3 10/20/2020  ? CO2 30 (H) 10/20/2020  ? GLUCOSE 88 10/20/2020  ? BUN 23 10/20/2020  ? CREATININE 7.56 (H) 10/20/2020  ? CALCIUM 8.8 10/20/2020  ? EGFR 7 (L) 10/20/2020  ? GFRNONAA 6 (L) 02/26/2020  ? ?Lab Results  ?Component Value Date  ? CHOL 236 (H) 10/20/2020  ? HDL 43 10/20/2020  ? LDLCALC 123 (H) 10/20/2020  ? LDLDIRECT 152.0 03/27/2019  ? TRIG 396 (H) 10/20/2020  ? CHOLHDL 4.7 09/27/2019  ? ?No results found for: TSH ?No results found for:  HGBA1C ?Lab Results  ?Component Value Date  ? WBC 6.1 10/20/2020  ? HGB 15.0 10/20/2020  ? HCT 44.7 10/20/2020  ? MCV 99 (H) 10/20/2020  ? PLT 173 10/20/2020  ? ?Lab Results  ?Component Value Date  ? ALT 9 02/25/2020  ? AST 7 (L) 02/25/2020  ? ALKPHOS 82 02/25/2020  ? BILITOT 0.8 02/25/2020  ? ?Lab Results  ?Component Value Date  ? VD25OH 34 09/27/2019  ?  ? ?Review of Systems  ?Constitutional:  Negative for chills and fever.  ?HENT:  Negative for drooling, ear discharge, ear pain and sore throat.   ?Respiratory:  Negative for cough, shortness of breath and wheezing.   ?Cardiovascular:  Negative for chest pain, palpitations and leg swelling.  ?Gastrointestinal:  Positive for abdominal pain and hematochezia. Negative for anorexia, blood in stool, constipation, diarrhea, hemorrhoids, nausea, rectal pain and vomiting.  ?Endocrine: Negative for polydipsia.  ?Genitourinary:  Positive for hesitancy. Negative for dysuria, flank pain, frequency, hematuria, pelvic pain, penile discharge, penile pain, scrotal swelling, testicular pain and urgency.  ?Musculoskeletal:  Negative for back pain, joint pain, myalgias and neck pain.  ?Skin:  Negative for rash.  ?Allergic/Immunologic: Negative for environmental allergies.  ?Neurological:  Negative for dizziness and headaches.  ?Hematological:  Does not bruise/bleed easily.  ?Psychiatric/Behavioral:  Negative for suicidal ideas. The patient is not nervous/anxious.   ? ?Patient Active Problem List  ? Diagnosis Date Noted  ? Hypercalcemia 09/23/2020  ? Urinary tract infection with hematuria   ? Hydronephrosis with renal and ureteral calculus obstruction 02/24/2020  ? Breakdown of surgically created  AV fistula, init ([place]) 10/23/2017  ? Decreased energy 10/23/2017  ? Mild protein-calorie malnutrition ([place]) 08/18/2017  ? Anemia in chronic kidney disease 07/29/2017  ? Coagulation defect, unspecified ([place]) 07/29/2017  ? Iron deficiency anemia, unspecified 07/29/2017  ? Secondary  hyperparathyroidism of renal origin ([place]) 07/29/2017  ? ESRD on dialysis ([place]) 07/26/2017  ? SOB (shortness of breath) 07/25/2017  ? 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  ? COPD (chronic obstructive pulmonary disease) ([place]) 07/14/2016  ? History of renal cell cancer 07/12/2016  ? Olecranon bursitis 05/27/2016  ? Malignant tumor of prostate ([place]) 09/18/2015  ? Benign prostatic hyperplasia with lower urinary tract symptoms 08/05/2015  ? Pre-transplant evaluation for kidney transplant 08/05/2015  ? Acquired arteriovenous fistula ([place]) 04/30/2015  ? ESRD on hemodialysis ([place]) 02/04/2015  ? Hyperparathyroidism ([place]) 02/04/2015  ? Hyperphosphatemia 02/04/2015  ? Anemia of renal disease 02/04/2015  ? History of transfusion 12/13/2014  ? Elevated prostate specific antigen (PSA) 08/18/2012  ? Essential hypertension 08/18/2012  ? Gout 08/18/2012  ? Acquired cyst of kidney 04/13/2012  ? ? ?Allergies  ?Allergen Reactions  ? Sensipar [Cinacalcet]   ?  Other reaction(s): Unknown  ? ? ?Past Surgical History:  ?Procedure Laterality Date  ? CYSTOSCOPY/URETEROSCOPY/HOLMIUM LASER/STENT PLACEMENT N/A 02/25/2020  ? Procedure: CYSTOSCOPY/URETEROSCOPY/HOLMIUM LASER/STENT PLACEMENT;  Surgeon: Abbie Sons, MD;  Location: [facility] ORS;  Service: Urology;  Laterality: N/A;  ? CYSTOSCOPY/URETEROSCOPY/HOLMIUM LASER/STENT PLACEMENT Right 03/09/2020  ? Procedure: CYSTOSCOPY/URETEROSCOPY/HOLMIUM LASER/STENT Exchange;  Surgeon: Abbie Sons, MD;  Location: [facility] ORS;  Service: Urology;  Laterality: Right;  ? EXTRACORPOREAL SHOCK WAVE LITHOTRIPSY    ? LITHOTRIPSY    ? x 5  ? PROSTATE BIOPSY    ? ? ?Social History  ? ?Tobacco Use  ? Smoking status: Every Day  ?  Packs/day: 0.50  ?  Years: 30.00  ?  Pack years: 15.00  ?  Types: Cigarettes  ? Smokeless tobacco: Never  ?Vaping Use  ? Vaping Use: Never used  ?Substance Use Topics  ? Alcohol use: Yes  ?  Comment: on holidays  ? Drug use: Never  ? ? ? ?Medication list has been reviewed and  updated. ? ?Current Meds  ?Medication Sig  ? AURYXIA 1 GM 210 MG(Fe) tablet Take 3 tablets (630 mg total) by mouth 3 (three) times daily.  ? B Complex-C-Zn-Folic Acid (DIALYVITE 016-PVVZ 15) 0.8 MG TABS Take 1 tablet by mouth every evening.  ? ? ? ?  07/20/2021  ?  1:20 PM 10/20/2020  ?  2:41 PM  ?GAD 7 : Generalized Anxiety Score  ?Nervous, Anxious, on Edge 0 0  ?Control/stop worrying 0 0  ?Worry too much - different things 0 0  ?Trouble relaxing 0 0  ?Restless 0 0  ?Easily annoyed or irritable 0 1  ?Afraid - awful might happen 0 0  ?Total GAD 7 Score 0 1  ?Anxiety Difficulty Not difficult at all Not difficult at all  ? ? ? ?  07/20/2021  ?  1:20 PM  ?Depression screen PHQ 2/9  ?Decreased Interest 0  ?Down, Depressed, Hopeless 0  ?PHQ - 2 Score 0  ?Altered sleeping 0  ?Tired, decreased energy 1  ?Change in appetite 1  ?Feeling bad or failure about yourself  0  ?Trouble concentrating 0  ?Moving slowly or fidgety/restless 0  ?Suicidal thoughts 0  ?PHQ-9 Score 2  ?Difficult doing work/chores Not difficult at all  ? ? ?BP Readings from Last 3 Encounters:  ?07/20/21 122/80  ?07/05/21 92/65  ?10/20/20 102/60  ? ? ?  Physical Exam ?Vitals and nursing note reviewed.  ?HENT:  ?   Head: Normocephalic.  ?   Right Ear: External ear normal.  ?   Left Ear: External ear normal.  ?   Nose: Nose normal.  ?Eyes:  ?   General: No scleral icterus.    ?   Right eye: No discharge.     ?   Left eye: No discharge.  ?   Conjunctiva/sclera: Conjunctivae normal.  ?   Pupils: Pupils are equal, round, and reactive to light.  ?Neck:  ?   Thyroid: No thyromegaly.  ?   Vascular: No JVD.  ?   Trachea: No tracheal deviation.  ?Cardiovascular:  ?   Rate and Rhythm: Normal rate and regular rhythm.  ?   Heart sounds: Normal heart sounds. No murmur heard. ?  No friction rub. No gallop.  ?Pulmonary:  ?   Effort: No respiratory distress.  ?   Breath sounds: Normal breath sounds. No wheezing or rales.  ?Abdominal:  ?   General: Bowel sounds are normal.  ?    Palpations: Abdomen is soft. There is no mass.  ?   Tenderness: There is no abdominal tenderness. There is no guarding or rebound.  ?Musculoskeletal:  ?   Cervical back: Normal range of motion and neck supple.  ?Lymphadenop

## 2021-07-20 NOTE — Telephone Encounter (Signed)
Notified pt as advised, pt expressed understanding.  ?

## 2021-07-20 NOTE — Progress Notes (Signed)
? ? ?07/20/2021 ?8:09 AM  ? ?Dwayne Reed ?October 30, 1955 ?[id number] ? ?Referring provider: Juline Patch, MD ?[address] ?[address] ?[place],  [place] [address] ? ?Urological history: ?1.  Low risk prostate cancer ?-PSA 12.1, 10/2020 ?-Gleason 3+3 (single foci at the left mid base) his PSA was in the 9 range a back to 2014.  Ultimately, he underwent prostate biopsy in 2017 at which time his PSA was 9.4.  Prostate volume 66 g at that time. ? ?2. Renal cell carcinoma ?-cryoablation of the left-sided tumor and 2016.  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 ?-RUS, 04/2019 - Severe diffuse bilateral cortical thinning and increased echotexture compatible with chronic medical renal disease.  No suspicious focal renal mass. ? ?3. Nephrolithiasis ?-Stone composition of calcium oxalate ?-Right URS 2021 ?-KUB, 07/2021 Small nonobstructing stones are identified in both kidneys.  In the left inferior pelvis, there is a 2 mm calcific density, stone within the distal left ureter is not excluded.  3 mm calcific density projected in the right sacrum, stone within ?the distal right ureter is not excluded. ? ?4. ESRD ?-on dialysis ?-oliguric  ? ?  ? ?Chief Complaint  ?Patient presents with  ? Urinary Retention  ? ? ?HPI: ?Dwayne Reed is a 66 y.o. male who presents today after his PCP notifying us he has not urinating since this morning.  ? ?He has been on dialysis for several years and had not made urine in quite some time.  What is concerning him today, is that on Sunday he urinated 7-8 times with a strong urgency and he is concerned he may be trying to pass a stone.  He states that after Sunday he has had no further episodes of urgency, he did pass a few drops this morning but otherwise he is back to his baseline of no urine production.   ? ?Patient denies any modifying or aggravating factors.  Patient denies any gross hematuria, dysuria or suprapubic/flank pain.  Patient denies any fevers, chills,  nausea or vomiting.   ? ?PVR 0 mL  ? ?KUB bilateral nephrolithiasis and possible right ureteral stone  ? ?PMH: ?Past Medical History:  ?Diagnosis Date  ? Anemia   ? low iron  ? CHF (congestive heart failure) ([place])   ? pt denies  ? COPD (chronic obstructive pulmonary disease) ([place])   ? Dialysis patient [facility])   ? History of kidney stones   ? Hypertension   ? Kidney stones   ? Pneumonia   ? Renal disorder   ? ESRD on HD (M,W,F)  ? ? ?Surgical History: ?Past Surgical History:  ?Procedure Laterality Date  ? CYSTOSCOPY/URETEROSCOPY/HOLMIUM LASER/STENT PLACEMENT N/A 02/25/2020  ? Procedure: CYSTOSCOPY/URETEROSCOPY/HOLMIUM LASER/STENT PLACEMENT;  Surgeon: Abbie Sons, MD;  Location: [facility] ORS;  Service: Urology;  Laterality: N/A;  ? CYSTOSCOPY/URETEROSCOPY/HOLMIUM LASER/STENT PLACEMENT Right 03/09/2020  ? Procedure: CYSTOSCOPY/URETEROSCOPY/HOLMIUM LASER/STENT Exchange;  Surgeon: Abbie Sons, MD;  Location: [facility] ORS;  Service: Urology;  Laterality: Right;  ? EXTRACORPOREAL SHOCK WAVE LITHOTRIPSY    ? LITHOTRIPSY    ? x 5  ? PROSTATE BIOPSY    ? ? ?Home Medications:  ?Allergies as of 07/20/2021   ? ?   Reactions  ? Sensipar [cinacalcet]   ? Other reaction(s): Unknown  ? ?  ? ?  ?Medication List  ?  ? ?  ? Accurate as of Jul 20, 2021 11:59 PM. If you have any questions, ask your nurse or doctor.  ?  ?  ? ?  ? ?  STOP taking these medications   ? ?guaiFENesin-codeine 100-10 MG/5ML syrup ?Stopped by: Otilio Miu, MD ?  ? ?  ? ?TAKE these medications   ? ?amLODipine 10 MG tablet ?Commonly known as: NORVASC ?Take 1 tablet (10 mg total) by mouth daily. ?What changed: Another medication with the same name was removed. Continue taking this medication, and follow the directions you see here. ?Changed by: Otilio Miu, MD ?  ?Auryxia 1 GM 210 MG(Fe) tablet ?Generic drug: ferric citrate ?Take 3 tablets (630 mg total) by mouth 3 (three) times daily. ?  ?carvedilol 12.5 MG tablet ?Commonly known as: COREG ?Take 1 tablet (12.5 mg  total) by mouth 2 (two) times daily with a meal. ?  ?Dialyvite 800-Zinc 15 0.8 MG Tabs ?Take 1 tablet by mouth every evening. ?  ?tamsulosin 0.4 MG Caps capsule ?Commonly known as: FLOMAX ?Take 1 capsule (0.4 mg total) by mouth daily. ?  ?vardenafil 10 MG tablet ?Commonly known as: LEVITRA ?Take by mouth. ?  ? ?  ? ? ?Allergies:  ?Allergies  ?Allergen Reactions  ? Sensipar [Cinacalcet]   ?  Other reaction(s): Unknown  ? ? ?Family History: ?Family History  ?Problem Relation Age of Onset  ? Hypertension Mother   ? Heart disease Mother   ? Sarcoidosis Sister   ? Cancer Maternal Grandmother   ?     type unknown  ? ? ?Social History:  reports that he has been smoking cigarettes. He has a 15.00 pack-year smoking history. He has never used smokeless tobacco. He reports current alcohol use. He reports that he does not use drugs. ? ?ROS: ?Pertinent ROS in HPI ? ?Physical Exam: ?BP 96/60   Pulse 86   Ht 6' (1.829 m)   Wt 176 lb (79.8 kg)   BMI 23.87 kg/m?   ?Constitutional:  Well nourished. Alert and oriented, No acute distress. ?HEENT: [place] AT, moist mucus membranes.  Trachea midline, no masses. ?Cardiovascular: No clubbing, cyanosis, or edema. ?Respiratory: Normal respiratory effort, no increased work of breathing. ?Neurologic: Grossly intact, no focal deficits, moving all 4 extremities. ?Psychiatric: Normal mood and affect. ? ?Laboratory Data: ?Lab Results  ?Component Value Date  ? WBC 5.5 07/20/2021  ? HGB 14.3 07/20/2021  ? HCT 41.6 07/20/2021  ? MCV 97 07/20/2021  ? PLT 175 07/20/2021  ? ? ?Lab Results  ?Component Value Date  ? CREATININE 7.35 (H) 07/20/2021  ? ?   ?Component Value Date/Time  ? CHOL 236 (H) 10/20/2020 1601  ? HDL 43 10/20/2020 1601  ? CHOLHDL 4.7 09/27/2019 1627  ? VLDL 40.6 (H) 03/27/2019 1517  ? LDLCALC 123 (H) 10/20/2020 1601  ? LDLCALC 112 (H) 09/27/2019 1627  ? ? ?Lab Results  ?Component Value Date  ? AST 7 (L) 02/25/2020  ? ?Lab Results  ?Component Value Date  ? ALT 9 02/25/2020  ?I have  reviewed the labs. ? ? ?Pertinent Imaging: ?Narrative & Impression  ?CLINICAL DATA:  Painful urination since Sunday. ?  ?EXAM: ?ABDOMEN - 1 VIEW ?  ?COMPARISON:  CT abdomen pelvis February 24, 2020 ?  ?FINDINGS: ?The bowel gas pattern is normal. Small nonobstructing stones are ?identified in both kidneys. In the left inferior pelvis, there is a ?2 mm calcific density, stone within the distal left ureter is not ?excluded. There is a 3 mm calcific density projected in the right ?sacrum, stone within the distal right ureter is not excluded. ?  ?IMPRESSION: ?1. Small nonobstructing stones are identified in both kidneys. ?2. In the left  inferior pelvis, there is a 2 mm calcific density, ?stone within the distal left ureter is not excluded. ?3. 3 mm calcific density projected in the right sacrum, stone within ?the distal right ureter is not excluded. ?  ?  ?Electronically Signed ?  By: Abelardo Diesel M.D. ?  On: 07/22/2021 11:59 ?   ? ?I have independently reviewed the films.   ? ?Assessment & Plan:   ? ?1. Oliguria  ?- Bladder Scan (Post Void Residual) in office ?- PVR nil ? ?2. Prostate cancer ?-messaged Dr. Ronnald Ramp to add PSA to the blood work done earlier this afternoon ? ?3. Nephrolithiasis ?-episode of urgency on Sunday may be secondary to an ureteral stone  ?-KUB bilateral nephrolithiasis with possible bilateral ureteral stones ?-CT renal stone pending  ? ? ?Return for CT renal stone report . ? ?These notes generated with voice recognition software. I apologize for typographical errors. ? ?Hanford Lust, PA-C ? ?[facility] ?[place], [place] [address] ?[phone] ?  ?

## 2021-07-21 LAB — CBC
Hematocrit: 41.6 % (ref 37.5–51.0)
Hemoglobin: 14.3 g/dL (ref 13.0–17.7)
MCH: 33.5 pg — ABNORMAL HIGH (ref 26.6–33.0)
MCHC: 34.4 g/dL (ref 31.5–35.7)
MCV: 97 fL (ref 79–97)
Platelets: 175 10*3/uL (ref 150–450)
RBC: 4.27 x10E6/uL (ref 4.14–5.80)
RDW: 13.3 % (ref 11.6–15.4)
WBC: 5.5 10*3/uL (ref 3.4–10.8)

## 2021-07-21 LAB — RENAL FUNCTION PANEL
Albumin: 4.6 g/dL (ref 3.8–4.8)
BUN/Creatinine Ratio: 3 — ABNORMAL LOW (ref 10–24)
BUN: 21 mg/dL (ref 8–27)
CO2: 28 mmol/L (ref 20–29)
Calcium: 9 mg/dL (ref 8.6–10.2)
Chloride: 90 mmol/L — ABNORMAL LOW (ref 96–106)
Creatinine, Ser: 7.35 mg/dL — ABNORMAL HIGH (ref 0.76–1.27)
Glucose: 72 mg/dL (ref 70–99)
Phosphorus: 2.4 mg/dL — ABNORMAL LOW (ref 2.8–4.1)
Potassium: 3.8 mmol/L (ref 3.5–5.2)
Sodium: 140 mmol/L (ref 134–144)
eGFR: 8 mL/min/{1.73_m2} — ABNORMAL LOW (ref >59)

## 2021-07-22 DIAGNOSIS — Z992 Dependence on renal dialysis: Secondary | ICD-10-CM | POA: Diagnosis not present

## 2021-07-22 DIAGNOSIS — N2581 Secondary hyperparathyroidism of renal origin: Secondary | ICD-10-CM | POA: Diagnosis not present

## 2021-07-22 DIAGNOSIS — N186 End stage renal disease: Secondary | ICD-10-CM | POA: Diagnosis not present

## 2021-07-24 DIAGNOSIS — N2581 Secondary hyperparathyroidism of renal origin: Secondary | ICD-10-CM | POA: Diagnosis not present

## 2021-07-24 DIAGNOSIS — Z992 Dependence on renal dialysis: Secondary | ICD-10-CM | POA: Diagnosis not present

## 2021-07-24 DIAGNOSIS — N186 End stage renal disease: Secondary | ICD-10-CM | POA: Diagnosis not present

## 2021-07-27 ENCOUNTER — Encounter: Payer: Self-pay | Admitting: Urology

## 2021-07-27 DIAGNOSIS — N2581 Secondary hyperparathyroidism of renal origin: Secondary | ICD-10-CM | POA: Diagnosis not present

## 2021-07-27 DIAGNOSIS — N186 End stage renal disease: Secondary | ICD-10-CM | POA: Diagnosis not present

## 2021-07-27 DIAGNOSIS — Z992 Dependence on renal dialysis: Secondary | ICD-10-CM | POA: Diagnosis not present

## 2021-07-29 DIAGNOSIS — Z992 Dependence on renal dialysis: Secondary | ICD-10-CM | POA: Diagnosis not present

## 2021-07-29 DIAGNOSIS — N2581 Secondary hyperparathyroidism of renal origin: Secondary | ICD-10-CM | POA: Diagnosis not present

## 2021-07-29 DIAGNOSIS — N186 End stage renal disease: Secondary | ICD-10-CM | POA: Diagnosis not present

## 2021-07-31 DIAGNOSIS — N2581 Secondary hyperparathyroidism of renal origin: Secondary | ICD-10-CM | POA: Diagnosis not present

## 2021-07-31 DIAGNOSIS — Z992 Dependence on renal dialysis: Secondary | ICD-10-CM | POA: Diagnosis not present

## 2021-07-31 DIAGNOSIS — N186 End stage renal disease: Secondary | ICD-10-CM | POA: Diagnosis not present

## 2021-08-03 DIAGNOSIS — N2581 Secondary hyperparathyroidism of renal origin: Secondary | ICD-10-CM | POA: Diagnosis not present

## 2021-08-03 DIAGNOSIS — Z992 Dependence on renal dialysis: Secondary | ICD-10-CM | POA: Diagnosis not present

## 2021-08-03 DIAGNOSIS — N186 End stage renal disease: Secondary | ICD-10-CM | POA: Diagnosis not present

## 2021-08-05 DIAGNOSIS — Z992 Dependence on renal dialysis: Secondary | ICD-10-CM | POA: Diagnosis not present

## 2021-08-05 DIAGNOSIS — N186 End stage renal disease: Secondary | ICD-10-CM | POA: Diagnosis not present

## 2021-08-05 DIAGNOSIS — N2581 Secondary hyperparathyroidism of renal origin: Secondary | ICD-10-CM | POA: Diagnosis not present

## 2021-08-07 DIAGNOSIS — N186 End stage renal disease: Secondary | ICD-10-CM | POA: Diagnosis not present

## 2021-08-07 DIAGNOSIS — Z992 Dependence on renal dialysis: Secondary | ICD-10-CM | POA: Diagnosis not present

## 2021-08-07 DIAGNOSIS — N2581 Secondary hyperparathyroidism of renal origin: Secondary | ICD-10-CM | POA: Diagnosis not present

## 2021-08-10 DIAGNOSIS — N2581 Secondary hyperparathyroidism of renal origin: Secondary | ICD-10-CM | POA: Diagnosis not present

## 2021-08-10 DIAGNOSIS — N186 End stage renal disease: Secondary | ICD-10-CM | POA: Diagnosis not present

## 2021-08-10 DIAGNOSIS — Z992 Dependence on renal dialysis: Secondary | ICD-10-CM | POA: Diagnosis not present

## 2021-08-11 ENCOUNTER — Encounter: Payer: Self-pay | Admitting: Podiatry

## 2021-08-11 ENCOUNTER — Ambulatory Visit: Payer: Medicare Other | Admitting: Podiatry

## 2021-08-11 ENCOUNTER — Ambulatory Visit (INDEPENDENT_AMBULATORY_CARE_PROVIDER_SITE_OTHER): Payer: Medicare Other | Admitting: Podiatry

## 2021-08-11 DIAGNOSIS — M79674 Pain in right toe(s): Secondary | ICD-10-CM | POA: Diagnosis not present

## 2021-08-11 DIAGNOSIS — M79675 Pain in left toe(s): Secondary | ICD-10-CM

## 2021-08-11 DIAGNOSIS — B351 Tinea unguium: Secondary | ICD-10-CM | POA: Diagnosis not present

## 2021-08-11 DIAGNOSIS — N186 End stage renal disease: Secondary | ICD-10-CM

## 2021-08-11 DIAGNOSIS — Z992 Dependence on renal dialysis: Secondary | ICD-10-CM | POA: Diagnosis not present

## 2021-08-11 DIAGNOSIS — L84 Corns and callosities: Secondary | ICD-10-CM | POA: Diagnosis not present

## 2021-08-11 DIAGNOSIS — D631 Anemia in chronic kidney disease: Secondary | ICD-10-CM

## 2021-08-12 DIAGNOSIS — N186 End stage renal disease: Secondary | ICD-10-CM | POA: Diagnosis not present

## 2021-08-12 DIAGNOSIS — I129 Hypertensive chronic kidney disease with stage 1 through stage 4 chronic kidney disease, or unspecified chronic kidney disease: Secondary | ICD-10-CM | POA: Diagnosis not present

## 2021-08-12 DIAGNOSIS — N2581 Secondary hyperparathyroidism of renal origin: Secondary | ICD-10-CM | POA: Diagnosis not present

## 2021-08-12 DIAGNOSIS — Z992 Dependence on renal dialysis: Secondary | ICD-10-CM | POA: Diagnosis not present

## 2021-08-14 DIAGNOSIS — Z992 Dependence on renal dialysis: Secondary | ICD-10-CM | POA: Diagnosis not present

## 2021-08-14 DIAGNOSIS — N186 End stage renal disease: Secondary | ICD-10-CM | POA: Diagnosis not present

## 2021-08-14 DIAGNOSIS — N2581 Secondary hyperparathyroidism of renal origin: Secondary | ICD-10-CM | POA: Diagnosis not present

## 2021-08-15 NOTE — Progress Notes (Signed)
  Subjective:  Patient ID: Dwayne Reed, male    DOB: 1955/07/03,  MRN: [id number]  Dwayne Reed presents to clinic today for at risk foot care. Patient has h/o ESRD on hemodialysis and anemia  and corn(s) R 2nd toe, callus(es) b/l lower extremities and painful mycotic nails.  Pain interferes with ambulation. Aggravating factors include wearing enclosed shoe gear. Painful toenails interfere with ambulation. Aggravating factors include wearing enclosed shoe gear. Pain is relieved with periodic professional debridement. Painful corns and calluses are aggravated when weightbearing with and without shoegear. Pain is relieved with periodic professional debridement.  New problem(s): None.   PCP is Juline Patch, MD , and last visit was Jul 20, 2021.  Allergies  Allergen Reactions   Sensipar [Cinacalcet]     Other reaction(s): Unknown    Review of Systems: Negative except as noted in the HPI.  Objective: No changes noted in today's physical examination. Constitutional Patient is a pleasant 66 y.o. African American male WD, WN in NAD. AAO x 3.  Vascular CFT <3 seconds b/l LE. Palpable pedal pulses b/l LE. Pedal hair absent. No pain with calf compression b/l. Lower extremity skin temperature gradient within normal limits. No edema noted b/l LE. No cyanosis or clubbing noted b/l LE.  Neurologic Protective sensation intact 5/5 intact bilaterally with 10g monofilament b/l. Vibratory sensation intact b/l.  Dermatologic Pedal skin is warm and supple b/l.  No open wounds b/l lower extremities. No interdigital macerations b/l lower extremities. Toenails 1-5 b/l elongated, discolored, dystrophic, thickened, crumbly with subungual debris and tenderness to dorsal palpation. Hyperkeratotic lesion(s) R 2nd toe, submet head 1 left foot, submet head 3 right foot. No erythema, no edema, no drainage, no fluctuance.  Orthopedic: Normal muscle strength 5/5 to all lower extremity muscle groups bilaterally. Patient  ambulates independent of any assistive aids. HAV with bunion deformity noted b/l LE. Hammertoe deformity noted 2-5 b/l.   Assessment/Plan: 1. Pain due to onychomycosis of toenails of both feet   2. Corns and callosities   3. Anemia in ESRD (end-stage renal disease) ([place])   4. ESRD on dialysis [facility])     -Patient was evaluated and treated. All patient's and/or POA's questions/concerns answered on today's visit. -Mycotic toenails 1-5 bilaterally were debrided in length and girth with sterile nail nippers and dremel without incident. -Corn(s) R 2nd toe and callus(es) submet head 1 left foot and submet head 3 right foot were pared utilizing sterile scalpel blade without incident. Total number debrided =3. -Patient/POA to call should there be question/concern in the interim.   Return in about 3 months (around 11/11/2021).  [name], DPM

## 2021-08-17 DIAGNOSIS — Z992 Dependence on renal dialysis: Secondary | ICD-10-CM | POA: Diagnosis not present

## 2021-08-17 DIAGNOSIS — N186 End stage renal disease: Secondary | ICD-10-CM | POA: Diagnosis not present

## 2021-08-17 DIAGNOSIS — N2581 Secondary hyperparathyroidism of renal origin: Secondary | ICD-10-CM | POA: Diagnosis not present

## 2021-08-19 DIAGNOSIS — E782 Mixed hyperlipidemia: Secondary | ICD-10-CM | POA: Diagnosis not present

## 2021-08-19 DIAGNOSIS — N186 End stage renal disease: Secondary | ICD-10-CM | POA: Diagnosis not present

## 2021-08-19 DIAGNOSIS — R9431 Abnormal electrocardiogram [ECG] [EKG]: Secondary | ICD-10-CM | POA: Insufficient documentation

## 2021-08-19 DIAGNOSIS — I770 Arteriovenous fistula, acquired: Secondary | ICD-10-CM | POA: Diagnosis not present

## 2021-08-19 DIAGNOSIS — N185 Chronic kidney disease, stage 5: Secondary | ICD-10-CM | POA: Diagnosis not present

## 2021-08-19 DIAGNOSIS — Z992 Dependence on renal dialysis: Secondary | ICD-10-CM | POA: Diagnosis not present

## 2021-08-19 DIAGNOSIS — N2581 Secondary hyperparathyroidism of renal origin: Secondary | ICD-10-CM | POA: Diagnosis not present

## 2021-08-19 DIAGNOSIS — I10 Essential (primary) hypertension: Secondary | ICD-10-CM | POA: Diagnosis not present

## 2021-08-19 DIAGNOSIS — R0602 Shortness of breath: Secondary | ICD-10-CM | POA: Diagnosis not present

## 2021-08-21 DIAGNOSIS — N2581 Secondary hyperparathyroidism of renal origin: Secondary | ICD-10-CM | POA: Diagnosis not present

## 2021-08-21 DIAGNOSIS — Z992 Dependence on renal dialysis: Secondary | ICD-10-CM | POA: Diagnosis not present

## 2021-08-21 DIAGNOSIS — N186 End stage renal disease: Secondary | ICD-10-CM | POA: Diagnosis not present

## 2021-08-24 DIAGNOSIS — N186 End stage renal disease: Secondary | ICD-10-CM | POA: Diagnosis not present

## 2021-08-24 DIAGNOSIS — Z992 Dependence on renal dialysis: Secondary | ICD-10-CM | POA: Diagnosis not present

## 2021-08-24 DIAGNOSIS — N2581 Secondary hyperparathyroidism of renal origin: Secondary | ICD-10-CM | POA: Diagnosis not present

## 2021-08-26 DIAGNOSIS — N2581 Secondary hyperparathyroidism of renal origin: Secondary | ICD-10-CM | POA: Diagnosis not present

## 2021-08-26 DIAGNOSIS — Z992 Dependence on renal dialysis: Secondary | ICD-10-CM | POA: Diagnosis not present

## 2021-08-26 DIAGNOSIS — N186 End stage renal disease: Secondary | ICD-10-CM | POA: Diagnosis not present

## 2021-08-28 DIAGNOSIS — N2581 Secondary hyperparathyroidism of renal origin: Secondary | ICD-10-CM | POA: Diagnosis not present

## 2021-08-28 DIAGNOSIS — Z992 Dependence on renal dialysis: Secondary | ICD-10-CM | POA: Diagnosis not present

## 2021-08-28 DIAGNOSIS — N186 End stage renal disease: Secondary | ICD-10-CM | POA: Diagnosis not present

## 2021-08-31 DIAGNOSIS — Z992 Dependence on renal dialysis: Secondary | ICD-10-CM | POA: Diagnosis not present

## 2021-08-31 DIAGNOSIS — N186 End stage renal disease: Secondary | ICD-10-CM | POA: Diagnosis not present

## 2021-08-31 DIAGNOSIS — N2581 Secondary hyperparathyroidism of renal origin: Secondary | ICD-10-CM | POA: Diagnosis not present

## 2021-09-02 DIAGNOSIS — N186 End stage renal disease: Secondary | ICD-10-CM | POA: Diagnosis not present

## 2021-09-02 DIAGNOSIS — N2581 Secondary hyperparathyroidism of renal origin: Secondary | ICD-10-CM | POA: Diagnosis not present

## 2021-09-02 DIAGNOSIS — Z992 Dependence on renal dialysis: Secondary | ICD-10-CM | POA: Diagnosis not present

## 2021-09-04 DIAGNOSIS — N2581 Secondary hyperparathyroidism of renal origin: Secondary | ICD-10-CM | POA: Diagnosis not present

## 2021-09-04 DIAGNOSIS — Z992 Dependence on renal dialysis: Secondary | ICD-10-CM | POA: Diagnosis not present

## 2021-09-04 DIAGNOSIS — N186 End stage renal disease: Secondary | ICD-10-CM | POA: Diagnosis not present

## 2021-09-07 DIAGNOSIS — Z992 Dependence on renal dialysis: Secondary | ICD-10-CM | POA: Diagnosis not present

## 2021-09-07 DIAGNOSIS — N2581 Secondary hyperparathyroidism of renal origin: Secondary | ICD-10-CM | POA: Diagnosis not present

## 2021-09-07 DIAGNOSIS — N186 End stage renal disease: Secondary | ICD-10-CM | POA: Diagnosis not present

## 2021-09-09 DIAGNOSIS — N186 End stage renal disease: Secondary | ICD-10-CM | POA: Diagnosis not present

## 2021-09-09 DIAGNOSIS — N2581 Secondary hyperparathyroidism of renal origin: Secondary | ICD-10-CM | POA: Diagnosis not present

## 2021-09-09 DIAGNOSIS — Z992 Dependence on renal dialysis: Secondary | ICD-10-CM | POA: Diagnosis not present

## 2021-09-10 DIAGNOSIS — R0602 Shortness of breath: Secondary | ICD-10-CM | POA: Diagnosis not present

## 2021-09-10 DIAGNOSIS — R9431 Abnormal electrocardiogram [ECG] [EKG]: Secondary | ICD-10-CM | POA: Diagnosis not present

## 2021-09-11 DIAGNOSIS — I129 Hypertensive chronic kidney disease with stage 1 through stage 4 chronic kidney disease, or unspecified chronic kidney disease: Secondary | ICD-10-CM | POA: Diagnosis not present

## 2021-09-11 DIAGNOSIS — N2581 Secondary hyperparathyroidism of renal origin: Secondary | ICD-10-CM | POA: Diagnosis not present

## 2021-09-11 DIAGNOSIS — Z992 Dependence on renal dialysis: Secondary | ICD-10-CM | POA: Diagnosis not present

## 2021-09-11 DIAGNOSIS — N186 End stage renal disease: Secondary | ICD-10-CM | POA: Diagnosis not present

## 2021-09-13 DIAGNOSIS — N2581 Secondary hyperparathyroidism of renal origin: Secondary | ICD-10-CM | POA: Diagnosis not present

## 2021-09-13 DIAGNOSIS — N186 End stage renal disease: Secondary | ICD-10-CM | POA: Diagnosis not present

## 2021-09-13 DIAGNOSIS — Z992 Dependence on renal dialysis: Secondary | ICD-10-CM | POA: Diagnosis not present

## 2021-09-15 DIAGNOSIS — N186 End stage renal disease: Secondary | ICD-10-CM | POA: Diagnosis not present

## 2021-09-15 DIAGNOSIS — N2581 Secondary hyperparathyroidism of renal origin: Secondary | ICD-10-CM | POA: Diagnosis not present

## 2021-09-15 DIAGNOSIS — Z992 Dependence on renal dialysis: Secondary | ICD-10-CM | POA: Diagnosis not present

## 2021-09-17 DIAGNOSIS — Z992 Dependence on renal dialysis: Secondary | ICD-10-CM | POA: Diagnosis not present

## 2021-09-17 DIAGNOSIS — N2581 Secondary hyperparathyroidism of renal origin: Secondary | ICD-10-CM | POA: Diagnosis not present

## 2021-09-17 DIAGNOSIS — N186 End stage renal disease: Secondary | ICD-10-CM | POA: Diagnosis not present

## 2021-09-21 DIAGNOSIS — Z992 Dependence on renal dialysis: Secondary | ICD-10-CM | POA: Diagnosis not present

## 2021-09-21 DIAGNOSIS — R0602 Shortness of breath: Secondary | ICD-10-CM | POA: Diagnosis not present

## 2021-09-21 DIAGNOSIS — N186 End stage renal disease: Secondary | ICD-10-CM | POA: Diagnosis not present

## 2021-09-21 DIAGNOSIS — N185 Chronic kidney disease, stage 5: Secondary | ICD-10-CM | POA: Diagnosis not present

## 2021-09-21 DIAGNOSIS — N2581 Secondary hyperparathyroidism of renal origin: Secondary | ICD-10-CM | POA: Diagnosis not present

## 2021-09-21 DIAGNOSIS — I10 Essential (primary) hypertension: Secondary | ICD-10-CM | POA: Diagnosis not present

## 2021-09-21 DIAGNOSIS — E782 Mixed hyperlipidemia: Secondary | ICD-10-CM | POA: Diagnosis not present

## 2021-09-23 DIAGNOSIS — N2581 Secondary hyperparathyroidism of renal origin: Secondary | ICD-10-CM | POA: Diagnosis not present

## 2021-09-23 DIAGNOSIS — Z992 Dependence on renal dialysis: Secondary | ICD-10-CM | POA: Diagnosis not present

## 2021-09-23 DIAGNOSIS — N186 End stage renal disease: Secondary | ICD-10-CM | POA: Diagnosis not present

## 2021-09-25 DIAGNOSIS — N186 End stage renal disease: Secondary | ICD-10-CM | POA: Diagnosis not present

## 2021-09-25 DIAGNOSIS — N2581 Secondary hyperparathyroidism of renal origin: Secondary | ICD-10-CM | POA: Diagnosis not present

## 2021-09-25 DIAGNOSIS — Z992 Dependence on renal dialysis: Secondary | ICD-10-CM | POA: Diagnosis not present

## 2021-09-28 DIAGNOSIS — N186 End stage renal disease: Secondary | ICD-10-CM | POA: Diagnosis not present

## 2021-09-28 DIAGNOSIS — N2581 Secondary hyperparathyroidism of renal origin: Secondary | ICD-10-CM | POA: Diagnosis not present

## 2021-09-28 DIAGNOSIS — Z992 Dependence on renal dialysis: Secondary | ICD-10-CM | POA: Diagnosis not present

## 2021-09-30 ENCOUNTER — Telehealth: Payer: Self-pay | Admitting: Family Medicine

## 2021-09-30 DIAGNOSIS — N2581 Secondary hyperparathyroidism of renal origin: Secondary | ICD-10-CM | POA: Diagnosis not present

## 2021-09-30 DIAGNOSIS — N186 End stage renal disease: Secondary | ICD-10-CM | POA: Diagnosis not present

## 2021-09-30 DIAGNOSIS — Z992 Dependence on renal dialysis: Secondary | ICD-10-CM | POA: Diagnosis not present

## 2021-09-30 NOTE — Telephone Encounter (Signed)
Copied from [place] [phone]. Topic: Medicare AWV >> Sep 30, 2021 12:55 PM Jae Dire wrote: Reason for CRM:  Left message for patient to call back and schedule Medicare Annual Wellness Visit (AWV) in office.   If unable to come into the office for AWV,  please offer to do virtually or by telephone.  No hx of AWV eligible for AWVI per [employer] as of 06/12/2017  Please schedule at anytime with [facility] Health Advisor.      45 minute appointment   Any questions, please call me at [phone]

## 2021-10-02 DIAGNOSIS — N186 End stage renal disease: Secondary | ICD-10-CM | POA: Diagnosis not present

## 2021-10-02 DIAGNOSIS — N2581 Secondary hyperparathyroidism of renal origin: Secondary | ICD-10-CM | POA: Diagnosis not present

## 2021-10-02 DIAGNOSIS — Z992 Dependence on renal dialysis: Secondary | ICD-10-CM | POA: Diagnosis not present

## 2021-10-05 DIAGNOSIS — N186 End stage renal disease: Secondary | ICD-10-CM | POA: Diagnosis not present

## 2021-10-05 DIAGNOSIS — N2581 Secondary hyperparathyroidism of renal origin: Secondary | ICD-10-CM | POA: Diagnosis not present

## 2021-10-05 DIAGNOSIS — Z992 Dependence on renal dialysis: Secondary | ICD-10-CM | POA: Diagnosis not present

## 2021-10-07 DIAGNOSIS — N2581 Secondary hyperparathyroidism of renal origin: Secondary | ICD-10-CM | POA: Diagnosis not present

## 2021-10-07 DIAGNOSIS — Z992 Dependence on renal dialysis: Secondary | ICD-10-CM | POA: Diagnosis not present

## 2021-10-07 DIAGNOSIS — N186 End stage renal disease: Secondary | ICD-10-CM | POA: Diagnosis not present

## 2021-10-11 DIAGNOSIS — N186 End stage renal disease: Secondary | ICD-10-CM | POA: Diagnosis not present

## 2021-10-11 DIAGNOSIS — Z992 Dependence on renal dialysis: Secondary | ICD-10-CM | POA: Diagnosis not present

## 2021-10-12 DIAGNOSIS — N186 End stage renal disease: Secondary | ICD-10-CM | POA: Diagnosis not present

## 2021-10-12 DIAGNOSIS — Z992 Dependence on renal dialysis: Secondary | ICD-10-CM | POA: Diagnosis not present

## 2021-10-12 DIAGNOSIS — I129 Hypertensive chronic kidney disease with stage 1 through stage 4 chronic kidney disease, or unspecified chronic kidney disease: Secondary | ICD-10-CM | POA: Diagnosis not present

## 2021-10-14 DIAGNOSIS — Z992 Dependence on renal dialysis: Secondary | ICD-10-CM | POA: Diagnosis not present

## 2021-10-14 DIAGNOSIS — N186 End stage renal disease: Secondary | ICD-10-CM | POA: Diagnosis not present

## 2021-10-14 DIAGNOSIS — N2581 Secondary hyperparathyroidism of renal origin: Secondary | ICD-10-CM | POA: Diagnosis not present

## 2021-10-16 DIAGNOSIS — N2581 Secondary hyperparathyroidism of renal origin: Secondary | ICD-10-CM | POA: Diagnosis not present

## 2021-10-16 DIAGNOSIS — N186 End stage renal disease: Secondary | ICD-10-CM | POA: Diagnosis not present

## 2021-10-16 DIAGNOSIS — Z992 Dependence on renal dialysis: Secondary | ICD-10-CM | POA: Diagnosis not present

## 2021-10-19 DIAGNOSIS — Z992 Dependence on renal dialysis: Secondary | ICD-10-CM | POA: Diagnosis not present

## 2021-10-19 DIAGNOSIS — N186 End stage renal disease: Secondary | ICD-10-CM | POA: Diagnosis not present

## 2021-10-19 DIAGNOSIS — N2581 Secondary hyperparathyroidism of renal origin: Secondary | ICD-10-CM | POA: Diagnosis not present

## 2021-10-21 ENCOUNTER — Encounter: Payer: Self-pay | Admitting: Gastroenterology

## 2021-10-21 ENCOUNTER — Ambulatory Visit (INDEPENDENT_AMBULATORY_CARE_PROVIDER_SITE_OTHER): Payer: Medicare Other | Admitting: Gastroenterology

## 2021-10-21 VITALS — BP 98/67 | HR 89 | Temp 98.6°F | Ht 72.0 in | Wt 175.0 lb

## 2021-10-21 DIAGNOSIS — K625 Hemorrhage of anus and rectum: Secondary | ICD-10-CM | POA: Diagnosis not present

## 2021-10-21 DIAGNOSIS — N2581 Secondary hyperparathyroidism of renal origin: Secondary | ICD-10-CM | POA: Diagnosis not present

## 2021-10-21 DIAGNOSIS — Z992 Dependence on renal dialysis: Secondary | ICD-10-CM | POA: Diagnosis not present

## 2021-10-21 DIAGNOSIS — N186 End stage renal disease: Secondary | ICD-10-CM | POA: Diagnosis not present

## 2021-10-21 MED ORDER — PEG 3350-KCL-NA BICARB-NACL 420 G PO SOLR: 4000.0000 mL | Freq: Once | ORAL | 0 refills | Status: AC

## 2021-10-21 NOTE — Progress Notes (Signed)
Gastroenterology Consultation  Referring Provider:     Juline Patch, MD Primary Care Physician:  Juline Patch, MD Primary Gastroenterologist:  Dr. Allen Norris     Reason for Consultation:     Rectal bleeding        HPI:   Dwayne Reed is a 66 y.o. y/o male referred for consultation & management of rectal bleeding by Dr. Juline Patch, MD. This patient comes in today after reporting rectal bleeding back in May. The patient stated that he was having sporadic episodes of rectal bleeding.  He was reporting some suprapubic pain at that time.  The patient had been seen by [facility] back in 2019 for heme positive stools.  In 2019 the patient had reported that he had a colonoscopy 3 to 4 years prior to that at [facility].  At that time the patient was recommended to undergo an upper endoscopy. The patient denies any black stools or abdominal pain.  He also denies any unexplained weight loss.  Past Medical History:  Diagnosis Date   Anemia    low iron   CHF (congestive heart failure) (HCC)    pt denies   COPD (chronic obstructive pulmonary disease) ([place])    Dialysis patient ([place])    History of kidney stones    Hypertension    Kidney stones    Pneumonia    Renal disorder    ESRD on HD (M,W,F)    Past Surgical History:  Procedure Laterality Date   CYSTOSCOPY/URETEROSCOPY/HOLMIUM LASER/STENT PLACEMENT N/A 02/25/2020   Procedure: CYSTOSCOPY/URETEROSCOPY/HOLMIUM LASER/STENT PLACEMENT;  Surgeon: Abbie Sons, MD;  Location: [facility] ORS;  Service: Urology;  Laterality: N/A;   CYSTOSCOPY/URETEROSCOPY/HOLMIUM LASER/STENT PLACEMENT Right 03/09/2020   Procedure: CYSTOSCOPY/URETEROSCOPY/HOLMIUM LASER/STENT Exchange;  Surgeon: Abbie Sons, MD;  Location: [facility] ORS;  Service: Urology;  Laterality: Right;   EXTRACORPOREAL SHOCK WAVE LITHOTRIPSY     LITHOTRIPSY     x 5   PROSTATE BIOPSY      Prior to Admission medications   Medication Sig Start Date End Date Taking?  Authorizing Provider  amLODipine (NORVASC) 10 MG tablet Take 1 tablet (10 mg total) by mouth daily. Patient not taking: Reported on 10/20/2020 02/27/20   Fritzi Mandes, MD  AURYXIA 1 GM 210 MG(Fe) tablet Take 3 tablets (630 mg total) by mouth 3 (three) times daily. 03/19/19   Tonia Ghent, MD  B Complex-C-Zn-Folic Acid (DIALYVITE 096-GEZM 15) 0.8 MG TABS Take 1 tablet by mouth every evening.    [provider]  carvedilol (COREG) 12.5 MG tablet Take 1 tablet (12.5 mg total) by mouth 2 (two) times daily with a meal. Patient not taking: Reported on 10/20/2020 02/26/20   Fritzi Mandes, MD  tamsulosin (FLOMAX) 0.4 MG CAPS capsule Take 1 capsule (0.4 mg total) by mouth daily. Patient not taking: Reported on 10/20/2020 02/26/20   Fritzi Mandes, MD  vardenafil (LEVITRA) 10 MG tablet Take by mouth. Patient not taking: Reported on 07/20/2021 10/22/20   [provider]    Family History  Problem Relation Age of Onset   Hypertension Mother    Heart disease Mother    Sarcoidosis Sister    Cancer Maternal Grandmother        type unknown     Social History   Tobacco Use   Smoking status: Every Day    Packs/day: 0.50    Years: 30.00    Total pack years: 15.00    Types: Cigarettes   Smokeless  tobacco: Never  Vaping Use   Vaping Use: Never used  Substance Use Topics   Alcohol use: Yes    Comment: on holidays   Drug use: Never    Allergies as of 10/21/2021 - Review Complete 08/11/2021  Allergen Reaction Noted   Sensipar [cinacalcet]  03/18/2020    Review of Systems:    All systems reviewed and negative except where noted in HPI.   Physical Exam:  There were no vitals taken for this visit. No LMP for male patient. General:   Alert,  Well-developed, well-nourished, pleasant and cooperative in NAD Head:  Normocephalic and atraumatic. Eyes:  Sclera clear, no icterus.   Conjunctiva pink. Ears:  Normal auditory acuity. Neck:  Supple; no masses or thyromegaly. Lungs:  Respirations  even and unlabored.  Clear throughout to auscultation.   No wheezes, crackles, or rhonchi. No acute distress. Heart:  Regular rate and rhythm; no murmurs, clicks, rubs, or gallops. Abdomen:  Normal bowel sounds.  No bruits.  Soft, non-tender and non-distended without masses, hepatosplenomegaly or hernias noted.  No guarding or rebound tenderness.  Negative Carnett sign.   Rectal:  Deferred.  Pulses:  Normal pulses noted. Extremities:  No clubbing or edema.  No cyanosis. Neurologic:  Alert and oriented x3;  grossly normal neurologically. Skin:  Intact without significant lesions or rashes.  No jaundice. Lymph Nodes:  No significant cervical adenopathy. Psych:  Alert and cooperative. Normal mood and affect.  Imaging Studies: No results found.  Assessment and Plan:   Dwayne Reed is a 66 y.o. y/o male who comes in today with a history of rectal bleeding that has been intermittent.  The patient will be set up for a colonoscopy at the next appointment.  The patient has been explained the plan and agrees with it.    Lucilla Lame, MD. Marval Regal    Note: This dictation was prepared with Dragon dictation along with smaller phrase technology. Any transcriptional errors that result from this process are unintentional.

## 2021-10-23 DIAGNOSIS — Z992 Dependence on renal dialysis: Secondary | ICD-10-CM | POA: Diagnosis not present

## 2021-10-23 DIAGNOSIS — N186 End stage renal disease: Secondary | ICD-10-CM | POA: Diagnosis not present

## 2021-10-23 DIAGNOSIS — N2581 Secondary hyperparathyroidism of renal origin: Secondary | ICD-10-CM | POA: Diagnosis not present

## 2021-10-26 DIAGNOSIS — Z992 Dependence on renal dialysis: Secondary | ICD-10-CM | POA: Diagnosis not present

## 2021-10-26 DIAGNOSIS — N186 End stage renal disease: Secondary | ICD-10-CM | POA: Diagnosis not present

## 2021-10-26 DIAGNOSIS — N2581 Secondary hyperparathyroidism of renal origin: Secondary | ICD-10-CM | POA: Diagnosis not present

## 2021-10-27 ENCOUNTER — Telehealth: Payer: Self-pay

## 2021-10-27 NOTE — Telephone Encounter (Signed)
Procedure clearance faxed to Dr [facility]hem[facility]

## 2021-10-28 DIAGNOSIS — N2581 Secondary hyperparathyroidism of renal origin: Secondary | ICD-10-CM | POA: Diagnosis not present

## 2021-10-28 DIAGNOSIS — Z992 Dependence on renal dialysis: Secondary | ICD-10-CM | POA: Diagnosis not present

## 2021-10-28 DIAGNOSIS — N186 End stage renal disease: Secondary | ICD-10-CM | POA: Diagnosis not present

## 2021-10-30 DIAGNOSIS — Z992 Dependence on renal dialysis: Secondary | ICD-10-CM | POA: Diagnosis not present

## 2021-10-30 DIAGNOSIS — N186 End stage renal disease: Secondary | ICD-10-CM | POA: Diagnosis not present

## 2021-10-30 DIAGNOSIS — N2581 Secondary hyperparathyroidism of renal origin: Secondary | ICD-10-CM | POA: Diagnosis not present

## 2021-11-02 ENCOUNTER — Telehealth: Payer: Self-pay | Admitting: Family Medicine

## 2021-11-02 DIAGNOSIS — N186 End stage renal disease: Secondary | ICD-10-CM | POA: Diagnosis not present

## 2021-11-02 DIAGNOSIS — N2581 Secondary hyperparathyroidism of renal origin: Secondary | ICD-10-CM | POA: Diagnosis not present

## 2021-11-02 DIAGNOSIS — Z992 Dependence on renal dialysis: Secondary | ICD-10-CM | POA: Diagnosis not present

## 2021-11-02 NOTE — Telephone Encounter (Signed)
Copied from [place] [id number]. Topic: Medicare AWV >> Nov 02, 2021  2:03 PM Dwayne Reed wrote: Reason for CRM:  Left message for patient to call back and schedule Medicare Annual Wellness Visit (AWV) in office.   If unable to come into the office for AWV,  please offer to do virtually or by telephone.  No hx of AWV eligible for AWVI per [employer] as of 06/12/2017  Please schedule at anytime with [facility] Health Advisor.      45 minute appointment   Any questions, please call me at [phone]

## 2021-11-04 DIAGNOSIS — Z992 Dependence on renal dialysis: Secondary | ICD-10-CM | POA: Diagnosis not present

## 2021-11-04 DIAGNOSIS — N186 End stage renal disease: Secondary | ICD-10-CM | POA: Diagnosis not present

## 2021-11-04 DIAGNOSIS — N2581 Secondary hyperparathyroidism of renal origin: Secondary | ICD-10-CM | POA: Diagnosis not present

## 2021-11-06 DIAGNOSIS — N2581 Secondary hyperparathyroidism of renal origin: Secondary | ICD-10-CM | POA: Diagnosis not present

## 2021-11-06 DIAGNOSIS — N186 End stage renal disease: Secondary | ICD-10-CM | POA: Diagnosis not present

## 2021-11-06 DIAGNOSIS — Z992 Dependence on renal dialysis: Secondary | ICD-10-CM | POA: Diagnosis not present

## 2021-11-08 NOTE — Telephone Encounter (Signed)
Procedure clearance received and pt is cleared... form sent to be scanned

## 2021-11-09 DIAGNOSIS — N2581 Secondary hyperparathyroidism of renal origin: Secondary | ICD-10-CM | POA: Diagnosis not present

## 2021-11-09 DIAGNOSIS — N186 End stage renal disease: Secondary | ICD-10-CM | POA: Diagnosis not present

## 2021-11-09 DIAGNOSIS — Z992 Dependence on renal dialysis: Secondary | ICD-10-CM | POA: Diagnosis not present

## 2021-11-11 DIAGNOSIS — N186 End stage renal disease: Secondary | ICD-10-CM | POA: Diagnosis not present

## 2021-11-11 DIAGNOSIS — Z992 Dependence on renal dialysis: Secondary | ICD-10-CM | POA: Diagnosis not present

## 2021-11-11 DIAGNOSIS — N2581 Secondary hyperparathyroidism of renal origin: Secondary | ICD-10-CM | POA: Diagnosis not present

## 2021-11-12 DIAGNOSIS — Z992 Dependence on renal dialysis: Secondary | ICD-10-CM | POA: Diagnosis not present

## 2021-11-12 DIAGNOSIS — I129 Hypertensive chronic kidney disease with stage 1 through stage 4 chronic kidney disease, or unspecified chronic kidney disease: Secondary | ICD-10-CM | POA: Diagnosis not present

## 2021-11-12 DIAGNOSIS — N186 End stage renal disease: Secondary | ICD-10-CM | POA: Diagnosis not present

## 2021-11-13 DIAGNOSIS — N2581 Secondary hyperparathyroidism of renal origin: Secondary | ICD-10-CM | POA: Diagnosis not present

## 2021-11-13 DIAGNOSIS — Z992 Dependence on renal dialysis: Secondary | ICD-10-CM | POA: Diagnosis not present

## 2021-11-13 DIAGNOSIS — N186 End stage renal disease: Secondary | ICD-10-CM | POA: Diagnosis not present

## 2021-11-16 DIAGNOSIS — Z992 Dependence on renal dialysis: Secondary | ICD-10-CM | POA: Diagnosis not present

## 2021-11-16 DIAGNOSIS — N2581 Secondary hyperparathyroidism of renal origin: Secondary | ICD-10-CM | POA: Diagnosis not present

## 2021-11-16 DIAGNOSIS — N186 End stage renal disease: Secondary | ICD-10-CM | POA: Diagnosis not present

## 2021-11-18 DIAGNOSIS — N186 End stage renal disease: Secondary | ICD-10-CM | POA: Diagnosis not present

## 2021-11-18 DIAGNOSIS — N2581 Secondary hyperparathyroidism of renal origin: Secondary | ICD-10-CM | POA: Diagnosis not present

## 2021-11-18 DIAGNOSIS — Z992 Dependence on renal dialysis: Secondary | ICD-10-CM | POA: Diagnosis not present

## 2021-11-18 DIAGNOSIS — T7840XA Allergy, unspecified, initial encounter: Secondary | ICD-10-CM | POA: Insufficient documentation

## 2021-11-18 DIAGNOSIS — T782XXA Anaphylactic shock, unspecified, initial encounter: Secondary | ICD-10-CM | POA: Insufficient documentation

## 2021-11-20 DIAGNOSIS — N2581 Secondary hyperparathyroidism of renal origin: Secondary | ICD-10-CM | POA: Diagnosis not present

## 2021-11-20 DIAGNOSIS — Z992 Dependence on renal dialysis: Secondary | ICD-10-CM | POA: Diagnosis not present

## 2021-11-20 DIAGNOSIS — N186 End stage renal disease: Secondary | ICD-10-CM | POA: Diagnosis not present

## 2021-11-23 ENCOUNTER — Ambulatory Visit (INDEPENDENT_AMBULATORY_CARE_PROVIDER_SITE_OTHER): Payer: Self-pay | Admitting: Podiatry

## 2021-11-23 DIAGNOSIS — Z992 Dependence on renal dialysis: Secondary | ICD-10-CM | POA: Diagnosis not present

## 2021-11-23 DIAGNOSIS — Z91199 Patient's noncompliance with other medical treatment and regimen due to unspecified reason: Secondary | ICD-10-CM

## 2021-11-23 DIAGNOSIS — N186 End stage renal disease: Secondary | ICD-10-CM | POA: Diagnosis not present

## 2021-11-23 DIAGNOSIS — N2581 Secondary hyperparathyroidism of renal origin: Secondary | ICD-10-CM | POA: Diagnosis not present

## 2021-11-23 NOTE — Progress Notes (Signed)
1. No-show for appointment     

## 2021-11-24 ENCOUNTER — Ambulatory Visit: Payer: Medicare Other | Admitting: Gastroenterology

## 2021-11-25 DIAGNOSIS — N2581 Secondary hyperparathyroidism of renal origin: Secondary | ICD-10-CM | POA: Diagnosis not present

## 2021-11-25 DIAGNOSIS — Z992 Dependence on renal dialysis: Secondary | ICD-10-CM | POA: Diagnosis not present

## 2021-11-25 DIAGNOSIS — N186 End stage renal disease: Secondary | ICD-10-CM | POA: Diagnosis not present

## 2021-11-27 DIAGNOSIS — N2581 Secondary hyperparathyroidism of renal origin: Secondary | ICD-10-CM | POA: Diagnosis not present

## 2021-11-27 DIAGNOSIS — N186 End stage renal disease: Secondary | ICD-10-CM | POA: Diagnosis not present

## 2021-11-27 DIAGNOSIS — Z992 Dependence on renal dialysis: Secondary | ICD-10-CM | POA: Diagnosis not present

## 2021-11-30 DIAGNOSIS — N186 End stage renal disease: Secondary | ICD-10-CM | POA: Diagnosis not present

## 2021-11-30 DIAGNOSIS — N2581 Secondary hyperparathyroidism of renal origin: Secondary | ICD-10-CM | POA: Diagnosis not present

## 2021-11-30 DIAGNOSIS — Z992 Dependence on renal dialysis: Secondary | ICD-10-CM | POA: Diagnosis not present

## 2021-12-01 ENCOUNTER — Telehealth: Payer: Self-pay

## 2021-12-01 ENCOUNTER — Encounter: Payer: Self-pay | Admitting: Gastroenterology

## 2021-12-01 NOTE — Telephone Encounter (Signed)
Mr. Stankey was scheduled for a Colonoscopy with Dr Allen Norris for tomorrow.  He thought the procedure was scheduled for 12-06-21.  I have cancelled him for tomorrow because he ate all day today, plus he is on dialysis Tues-Thurs-Sat.  He is supposed to call the office for a reschedule date, but could you be sure that when he is scheduled, that it is on a Mon-Wed-Fri so he does not miss his normal dialysis days?  Thanks, Magda Paganini

## 2021-12-02 ENCOUNTER — Encounter: Admission: RE | Payer: Self-pay | Source: Ambulatory Visit

## 2021-12-02 ENCOUNTER — Ambulatory Visit: Admission: RE | Admit: 2021-12-02 | Payer: Medicare Other | Source: Ambulatory Visit | Admitting: Gastroenterology

## 2021-12-02 DIAGNOSIS — Z992 Dependence on renal dialysis: Secondary | ICD-10-CM | POA: Diagnosis not present

## 2021-12-02 DIAGNOSIS — N2581 Secondary hyperparathyroidism of renal origin: Secondary | ICD-10-CM | POA: Diagnosis not present

## 2021-12-02 DIAGNOSIS — N186 End stage renal disease: Secondary | ICD-10-CM | POA: Diagnosis not present

## 2021-12-02 SURGERY — COLONOSCOPY WITH PROPOFOL
Anesthesia: General

## 2021-12-03 IMAGING — US US RENAL
1 series · 14 of 25 positions shown · non-contrast
Comparison: CT 10/17/2017

CLINICAL DATA: Left renal cell carcinoma. History of prostate
cancer. Kidney stones. Left cryoablation 9105.

EXAM:
RENAL / URINARY TRACT ULTRASOUND COMPLETE

[Series 1: us renal · 0.26mm/px · 14 of 50 slices shown]
[im 1/50]
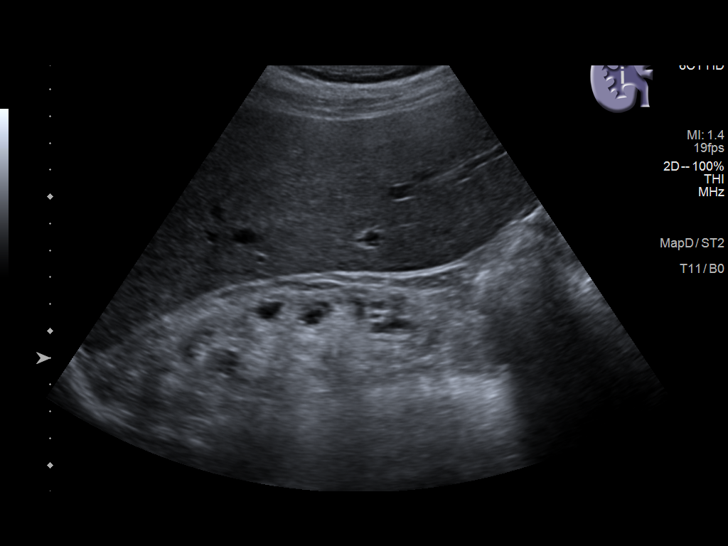
[im 5/50]
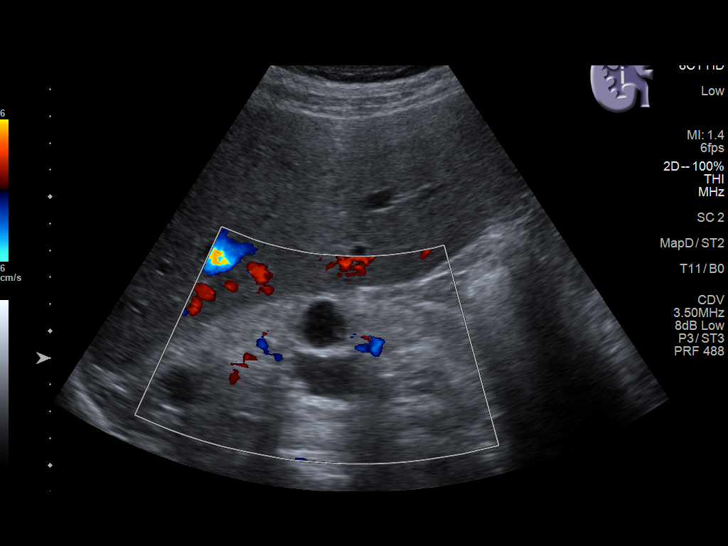
[im 9/50]
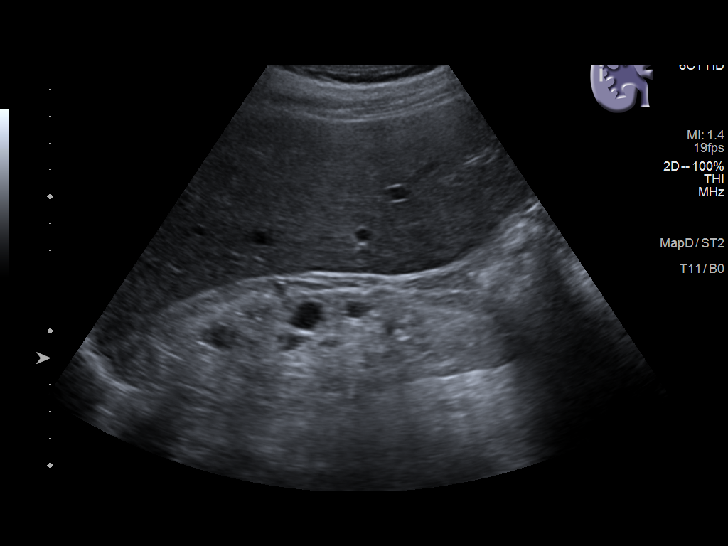
[im 13/50]
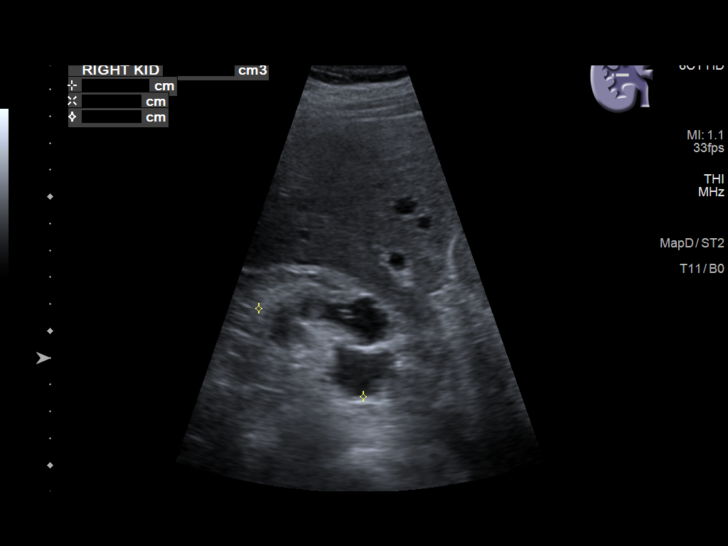
[im 17/50]
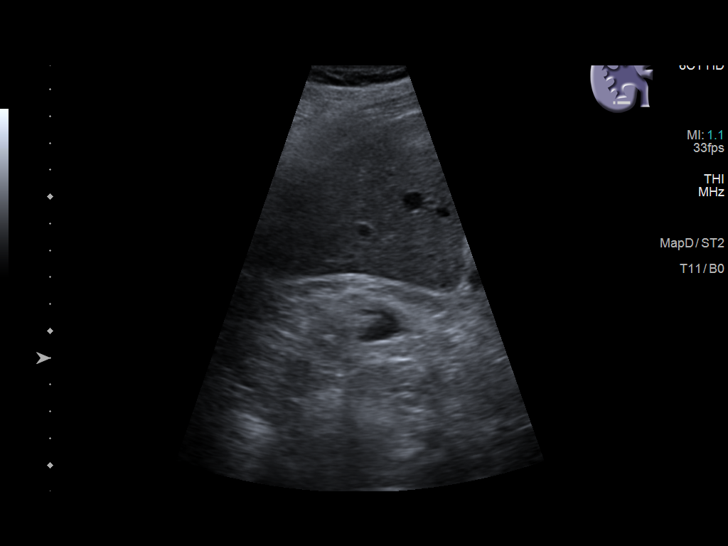
[im 19/50]
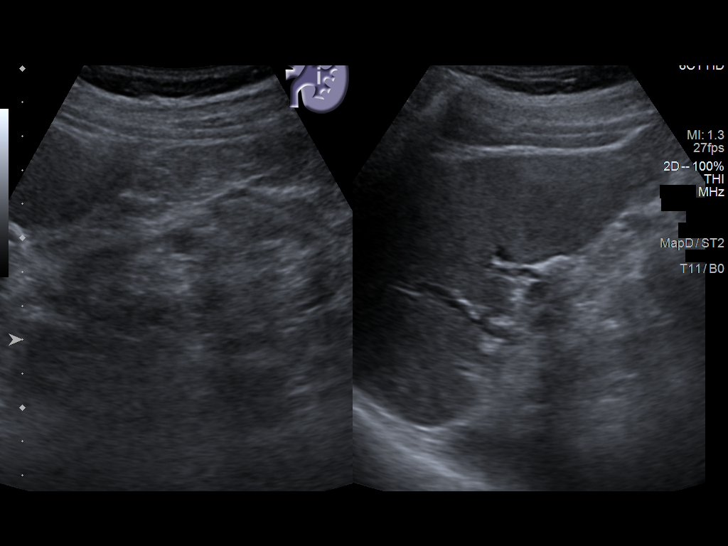
[im 23/50]
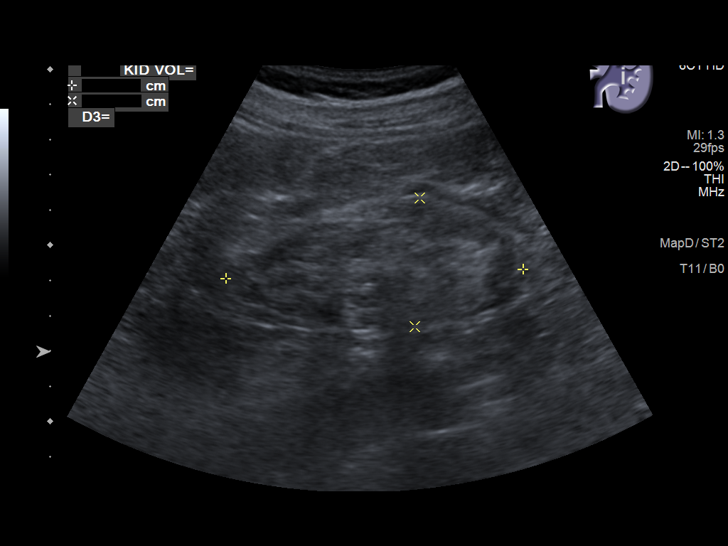
[im 27/50]
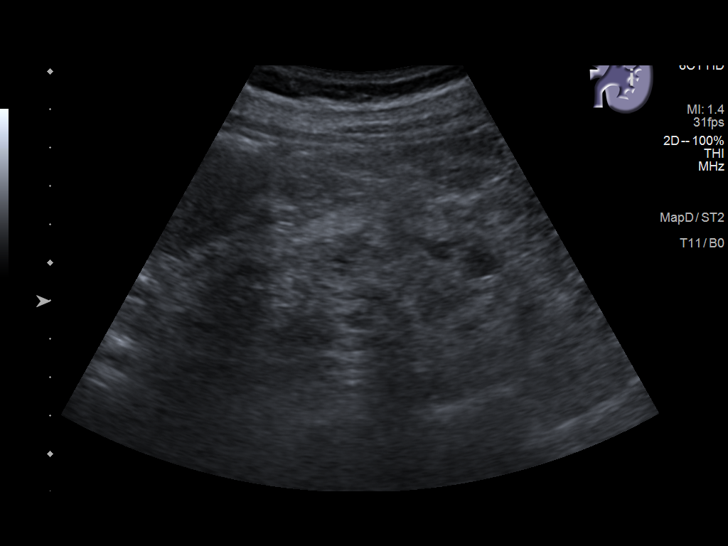
[im 31/50]
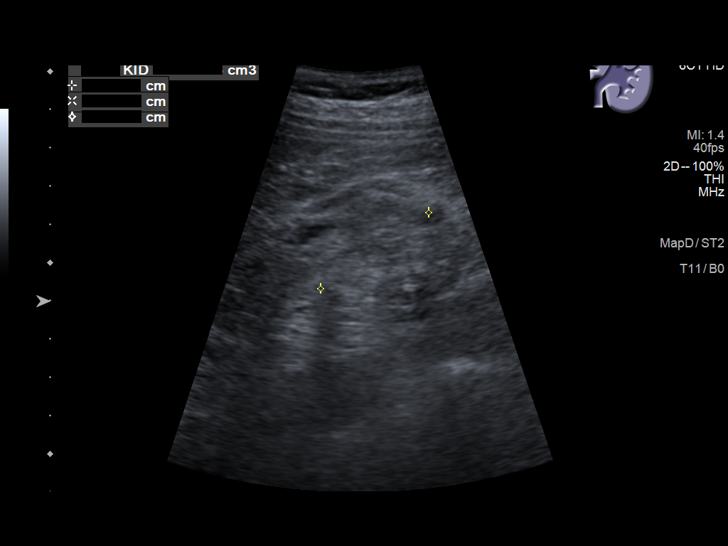
[im 33/50]
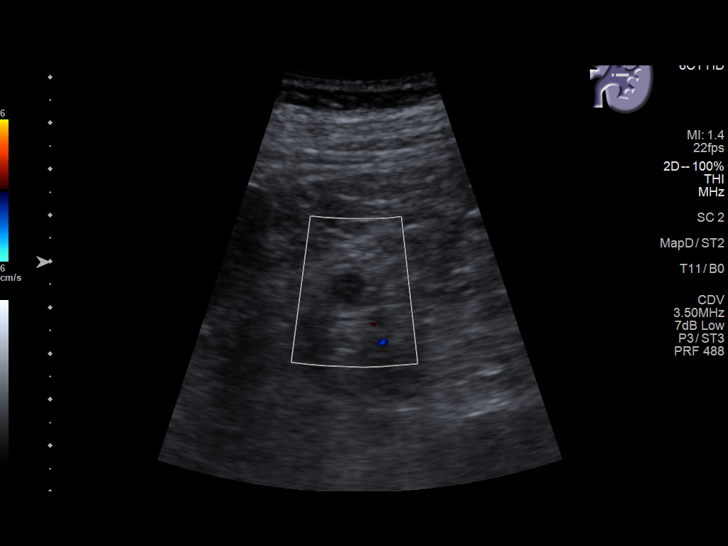
[im 37/50]
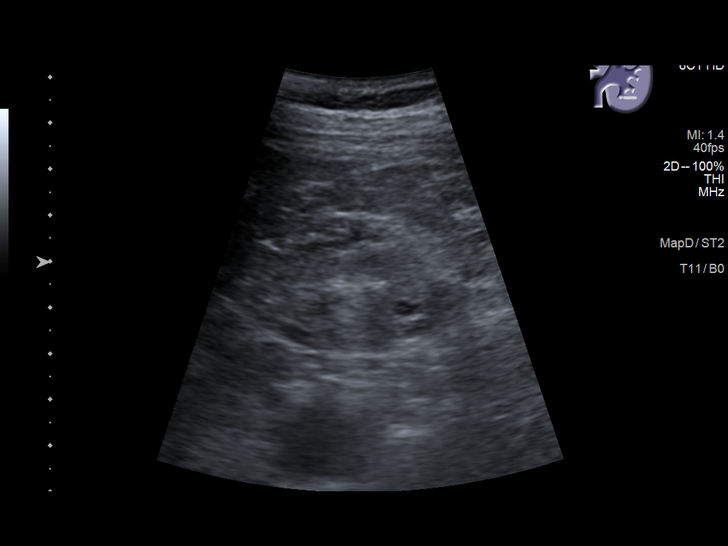
[im 41/50]
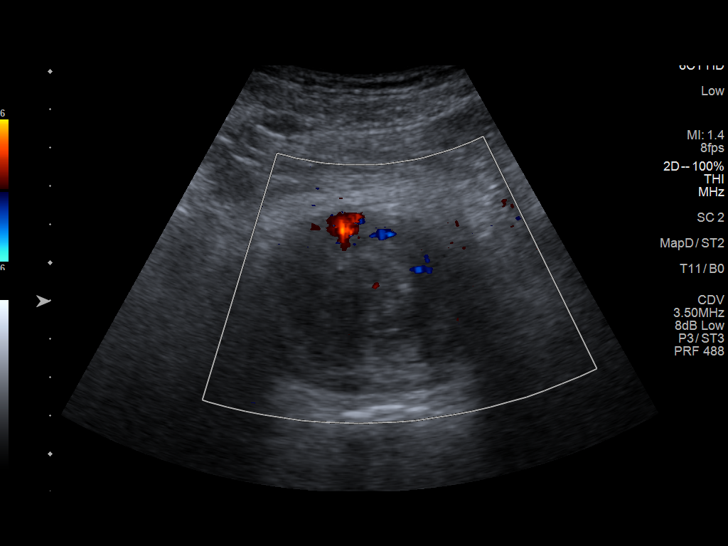
[im 45/50]
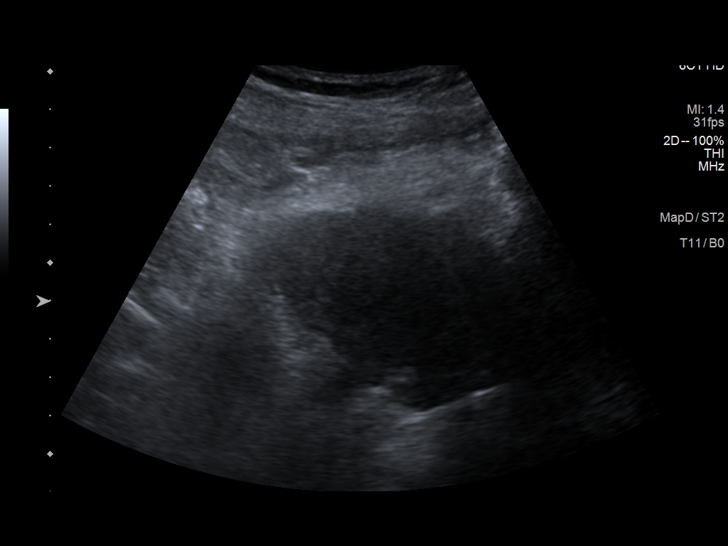
[im 50/50]
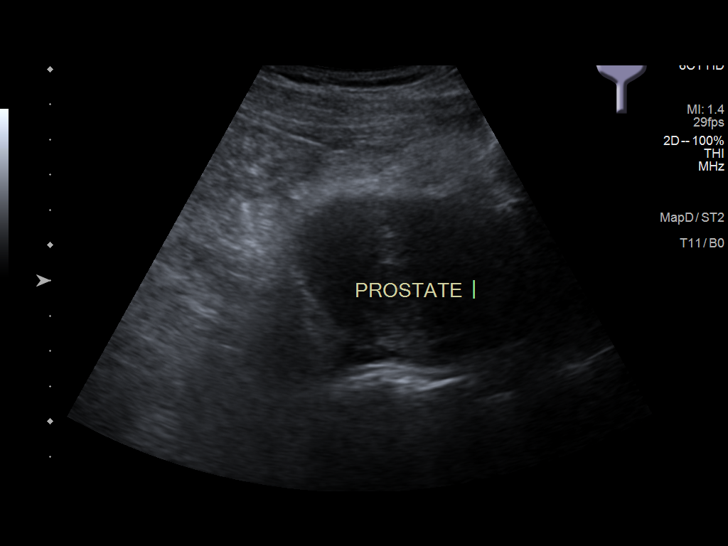

[14 of 25 positions shown; findings below may reference images not displayed]

FINDINGS: Right Kidney:

Renal measurements: 10.3 x 3.6 x 5.1 cm = volume: 98 mL. 1.9 cm cyst
in the midpole. Severe diffuse cortical thinning and increased
echotexture. Fullness of the renal pelvis is likely similar to prior
CT, likely extrarenal pelvis.

Left Kidney:

Renal measurements: 8.4 x 3.7 x 3.5 cm = volume: 56 mL. Small cysts
in the mid pole, the largest 8 mm. Severe cortical thinning and
increased echotexture. No suspicious lesion or hydronephrosis.

Bladder:

Nondistended, not visualized

Other:

Prostate enlarged.
IMPRESSION: Severe diffuse bilateral cortical thinning and increased echotexture
compatible with chronic medical renal disease.

No suspicious focal renal mass.

No acute findings.

Prostate enlargement.

## 2021-12-03 NOTE — Telephone Encounter (Signed)
Scheduled for October 11th with Dr Vicente Males... New instructions mailed

## 2021-12-03 NOTE — Addendum Note (Signed)
Addended by: Lurlean Nanny on: 12/03/2021 01:24 PM   Modules accepted: Orders

## 2021-12-04 DIAGNOSIS — Z992 Dependence on renal dialysis: Secondary | ICD-10-CM | POA: Diagnosis not present

## 2021-12-04 DIAGNOSIS — N2581 Secondary hyperparathyroidism of renal origin: Secondary | ICD-10-CM | POA: Diagnosis not present

## 2021-12-04 DIAGNOSIS — N186 End stage renal disease: Secondary | ICD-10-CM | POA: Diagnosis not present

## 2021-12-07 ENCOUNTER — Ambulatory Visit (INDEPENDENT_AMBULATORY_CARE_PROVIDER_SITE_OTHER): Payer: Medicare Other | Admitting: Podiatry

## 2021-12-07 DIAGNOSIS — B351 Tinea unguium: Secondary | ICD-10-CM | POA: Diagnosis not present

## 2021-12-07 DIAGNOSIS — M79675 Pain in left toe(s): Secondary | ICD-10-CM | POA: Diagnosis not present

## 2021-12-07 DIAGNOSIS — D631 Anemia in chronic kidney disease: Secondary | ICD-10-CM | POA: Diagnosis not present

## 2021-12-07 DIAGNOSIS — M79674 Pain in right toe(s): Secondary | ICD-10-CM

## 2021-12-07 DIAGNOSIS — Z992 Dependence on renal dialysis: Secondary | ICD-10-CM

## 2021-12-07 DIAGNOSIS — L84 Corns and callosities: Secondary | ICD-10-CM | POA: Diagnosis not present

## 2021-12-07 DIAGNOSIS — N2581 Secondary hyperparathyroidism of renal origin: Secondary | ICD-10-CM | POA: Diagnosis not present

## 2021-12-07 DIAGNOSIS — N186 End stage renal disease: Secondary | ICD-10-CM | POA: Diagnosis not present

## 2021-12-09 DIAGNOSIS — N2581 Secondary hyperparathyroidism of renal origin: Secondary | ICD-10-CM | POA: Diagnosis not present

## 2021-12-09 DIAGNOSIS — Z992 Dependence on renal dialysis: Secondary | ICD-10-CM | POA: Diagnosis not present

## 2021-12-09 DIAGNOSIS — N186 End stage renal disease: Secondary | ICD-10-CM | POA: Diagnosis not present

## 2021-12-11 ENCOUNTER — Encounter: Payer: Self-pay | Admitting: Podiatry

## 2021-12-11 DIAGNOSIS — N186 End stage renal disease: Secondary | ICD-10-CM | POA: Diagnosis not present

## 2021-12-11 DIAGNOSIS — N2581 Secondary hyperparathyroidism of renal origin: Secondary | ICD-10-CM | POA: Diagnosis not present

## 2021-12-11 DIAGNOSIS — Z992 Dependence on renal dialysis: Secondary | ICD-10-CM | POA: Diagnosis not present

## 2021-12-11 NOTE — Progress Notes (Signed)
  Subjective:  Patient ID: Dwayne Reed, male    DOB: [birth year],  MRN: [id number]  Dwayne Reed presents to clinic today for:  Chief Complaint  Patient presents with   Nail Problem    Routine foot care PCP-Deanna Ronnald Ramp PCP VST-Last year   New problem(s): None.   PCP is Juline Patch, MD , and last visit was  Jul 20, 2021.  Allergies  Allergen Reactions   Sensipar [Cinacalcet]     Other reaction(s): Unknown    Review of Systems: Negative except as noted in the HPI.  Objective: No changes noted in today's physical examination.  Dwayne Reed is a pleasant 66 y.o. male WD, WN in NAD. AAO x 3. Vascular CFT <3 seconds b/l LE. Palpable pedal pulses b/l LE. Pedal hair absent. No pain with calf compression b/l. Lower extremity skin temperature gradient within normal limits. No edema noted b/l LE. No cyanosis or clubbing noted b/l LE.  Neurologic Protective sensation intact 5/5 intact bilaterally with 10g monofilament b/l. Vibratory sensation intact b/l.  Dermatologic Pedal skin is warm and supple b/l.  No open wounds b/l lower extremities. No interdigital macerations b/l lower extremities. Toenails 1-5 b/l elongated, discolored, dystrophic, thickened, crumbly with subungual debris and tenderness to dorsal palpation. Hyperkeratotic lesion(s) R 2nd toe, submet head 1 left foot, submet head 3 right foot. No erythema, no edema, no drainage, no fluctuance.  Orthopedic: Normal muscle strength 5/5 to all lower extremity muscle groups bilaterally. Patient ambulates independent of any assistive aids. HAV with bunion deformity noted b/l LE. Hammertoe deformity noted 2-5 b/l.   Assessment/Plan: 1. Pain due to onychomycosis of toenails of both feet   2. Corns and callosities   3. ESRD on dialysis ([place])   4. Anemia in ESRD (end-stage renal disease) ([place])     No orders of the defined types were placed in this encounter.   -Consent given for treatment as described below: -Examined patient. -Mycotic  toenails 1-5 bilaterally were debrided in length and girth with sterile nail nippers and dremel without incident. -Corn(s) R 2nd toe and callus(es) submet head 1 right foot and submet head 3 right foot were pared utilizing sterile scalpel blade without incident. Total number debrided =3. -Patient/POA to call should there be question/concern in the interim.   Return in about 3 months (around 03/08/2022).  [name], DPM

## 2021-12-12 DIAGNOSIS — N186 End stage renal disease: Secondary | ICD-10-CM | POA: Diagnosis not present

## 2021-12-12 DIAGNOSIS — Z992 Dependence on renal dialysis: Secondary | ICD-10-CM | POA: Diagnosis not present

## 2021-12-12 DIAGNOSIS — I129 Hypertensive chronic kidney disease with stage 1 through stage 4 chronic kidney disease, or unspecified chronic kidney disease: Secondary | ICD-10-CM | POA: Diagnosis not present

## 2021-12-14 DIAGNOSIS — N186 End stage renal disease: Secondary | ICD-10-CM | POA: Diagnosis not present

## 2021-12-14 DIAGNOSIS — Z23 Encounter for immunization: Secondary | ICD-10-CM | POA: Diagnosis not present

## 2021-12-14 DIAGNOSIS — N2581 Secondary hyperparathyroidism of renal origin: Secondary | ICD-10-CM | POA: Diagnosis not present

## 2021-12-14 DIAGNOSIS — Z992 Dependence on renal dialysis: Secondary | ICD-10-CM | POA: Diagnosis not present

## 2021-12-16 DIAGNOSIS — Z23 Encounter for immunization: Secondary | ICD-10-CM | POA: Diagnosis not present

## 2021-12-16 DIAGNOSIS — Z992 Dependence on renal dialysis: Secondary | ICD-10-CM | POA: Diagnosis not present

## 2021-12-16 DIAGNOSIS — N186 End stage renal disease: Secondary | ICD-10-CM | POA: Diagnosis not present

## 2021-12-16 DIAGNOSIS — N2581 Secondary hyperparathyroidism of renal origin: Secondary | ICD-10-CM | POA: Diagnosis not present

## 2021-12-18 DIAGNOSIS — Z23 Encounter for immunization: Secondary | ICD-10-CM | POA: Diagnosis not present

## 2021-12-18 DIAGNOSIS — N186 End stage renal disease: Secondary | ICD-10-CM | POA: Diagnosis not present

## 2021-12-18 DIAGNOSIS — Z992 Dependence on renal dialysis: Secondary | ICD-10-CM | POA: Diagnosis not present

## 2021-12-18 DIAGNOSIS — N2581 Secondary hyperparathyroidism of renal origin: Secondary | ICD-10-CM | POA: Diagnosis not present

## 2021-12-21 ENCOUNTER — Encounter: Payer: Self-pay | Admitting: Gastroenterology

## 2021-12-21 DIAGNOSIS — N186 End stage renal disease: Secondary | ICD-10-CM | POA: Diagnosis not present

## 2021-12-21 DIAGNOSIS — Z23 Encounter for immunization: Secondary | ICD-10-CM | POA: Diagnosis not present

## 2021-12-21 DIAGNOSIS — Z992 Dependence on renal dialysis: Secondary | ICD-10-CM | POA: Diagnosis not present

## 2021-12-21 DIAGNOSIS — N2581 Secondary hyperparathyroidism of renal origin: Secondary | ICD-10-CM | POA: Diagnosis not present

## 2021-12-22 ENCOUNTER — Encounter
Admission: RE | Disposition: A | Discharge status: Home / Self Care | Payer: Self-pay | Source: Ambulatory Visit | Attending: Gastroenterology

## 2021-12-22 ENCOUNTER — Encounter: Payer: Self-pay | Admitting: Gastroenterology

## 2021-12-22 ENCOUNTER — Ambulatory Visit: Payer: Medicare Other | Admitting: Anesthesiology

## 2021-12-22 ENCOUNTER — Ambulatory Visit
Admission: RE | Admit: 2021-12-22 | Discharge: 2021-12-22 | Disposition: A | Discharge status: Home / Self Care | Payer: Medicare Other | Source: Ambulatory Visit | Attending: Gastroenterology | Admitting: Gastroenterology

## 2021-12-22 DIAGNOSIS — K625 Hemorrhage of anus and rectum: Secondary | ICD-10-CM

## 2021-12-22 SURGERY — COLONOSCOPY WITH PROPOFOL
Anesthesia: General

## 2021-12-22 MED ORDER — SODIUM CHLORIDE 0.9 % IV SOLN: INTRAVENOUS | Status: DC

## 2021-12-22 NOTE — Anesthesia Preprocedure Evaluation (Deleted)
Anesthesia Evaluation    Airway        Dental   Pulmonary Current Smoker,           Cardiovascular hypertension,      Neuro/Psych    GI/Hepatic   Endo/Other    Renal/GU      Musculoskeletal   Abdominal   Peds  Hematology   Anesthesia Other Findings   Reproductive/Obstetrics                             Anesthesia Physical Anesthesia Plan Anesthesia Quick Evaluation  

## 2021-12-23 DIAGNOSIS — Z23 Encounter for immunization: Secondary | ICD-10-CM | POA: Diagnosis not present

## 2021-12-23 DIAGNOSIS — Z992 Dependence on renal dialysis: Secondary | ICD-10-CM | POA: Diagnosis not present

## 2021-12-23 DIAGNOSIS — N186 End stage renal disease: Secondary | ICD-10-CM | POA: Diagnosis not present

## 2021-12-23 DIAGNOSIS — N2581 Secondary hyperparathyroidism of renal origin: Secondary | ICD-10-CM | POA: Diagnosis not present

## 2021-12-23 NOTE — OR Nursing (Signed)
Procedure cancelled due to poor prep.

## 2021-12-25 DIAGNOSIS — Z23 Encounter for immunization: Secondary | ICD-10-CM | POA: Diagnosis not present

## 2021-12-25 DIAGNOSIS — Z992 Dependence on renal dialysis: Secondary | ICD-10-CM | POA: Diagnosis not present

## 2021-12-25 DIAGNOSIS — N2581 Secondary hyperparathyroidism of renal origin: Secondary | ICD-10-CM | POA: Diagnosis not present

## 2021-12-25 DIAGNOSIS — N186 End stage renal disease: Secondary | ICD-10-CM | POA: Diagnosis not present

## 2021-12-28 DIAGNOSIS — N186 End stage renal disease: Secondary | ICD-10-CM | POA: Diagnosis not present

## 2021-12-28 DIAGNOSIS — Z992 Dependence on renal dialysis: Secondary | ICD-10-CM | POA: Diagnosis not present

## 2021-12-28 DIAGNOSIS — N2581 Secondary hyperparathyroidism of renal origin: Secondary | ICD-10-CM | POA: Diagnosis not present

## 2021-12-28 DIAGNOSIS — Z23 Encounter for immunization: Secondary | ICD-10-CM | POA: Diagnosis not present

## 2021-12-29 ENCOUNTER — Telehealth: Payer: Self-pay | Admitting: Family Medicine

## 2021-12-29 NOTE — Telephone Encounter (Signed)
Copied from [place] [id number]. Topic: Medicare AWV >> Dec 29, 2021 11:07 AM Jae Dire wrote: Reason for CRM:  Left message for patient to call back and schedule Medicare Annual Wellness Visit (AWV) in office.   If unable to come into the office for AWV,  please offer to do virtually or by telephone.  No hx of AWV eligible for AWVI per [employer] as of 06/12/2017  Please schedule at any time with [facility] -Nurse Health Advisor.      45 minute appointment   Any questions, please call me at [phone]

## 2021-12-30 DIAGNOSIS — Z992 Dependence on renal dialysis: Secondary | ICD-10-CM | POA: Diagnosis not present

## 2021-12-30 DIAGNOSIS — N186 End stage renal disease: Secondary | ICD-10-CM | POA: Diagnosis not present

## 2021-12-30 DIAGNOSIS — Z23 Encounter for immunization: Secondary | ICD-10-CM | POA: Diagnosis not present

## 2021-12-30 DIAGNOSIS — N2581 Secondary hyperparathyroidism of renal origin: Secondary | ICD-10-CM | POA: Diagnosis not present

## 2022-01-01 DIAGNOSIS — Z23 Encounter for immunization: Secondary | ICD-10-CM | POA: Diagnosis not present

## 2022-01-01 DIAGNOSIS — Z992 Dependence on renal dialysis: Secondary | ICD-10-CM | POA: Diagnosis not present

## 2022-01-01 DIAGNOSIS — N2581 Secondary hyperparathyroidism of renal origin: Secondary | ICD-10-CM | POA: Diagnosis not present

## 2022-01-01 DIAGNOSIS — N186 End stage renal disease: Secondary | ICD-10-CM | POA: Diagnosis not present

## 2022-01-04 DIAGNOSIS — Z23 Encounter for immunization: Secondary | ICD-10-CM | POA: Diagnosis not present

## 2022-01-04 DIAGNOSIS — N186 End stage renal disease: Secondary | ICD-10-CM | POA: Diagnosis not present

## 2022-01-04 DIAGNOSIS — Z992 Dependence on renal dialysis: Secondary | ICD-10-CM | POA: Diagnosis not present

## 2022-01-04 DIAGNOSIS — N2581 Secondary hyperparathyroidism of renal origin: Secondary | ICD-10-CM | POA: Diagnosis not present

## 2022-01-06 DIAGNOSIS — Z23 Encounter for immunization: Secondary | ICD-10-CM | POA: Diagnosis not present

## 2022-01-06 DIAGNOSIS — N2581 Secondary hyperparathyroidism of renal origin: Secondary | ICD-10-CM | POA: Diagnosis not present

## 2022-01-06 DIAGNOSIS — N186 End stage renal disease: Secondary | ICD-10-CM | POA: Diagnosis not present

## 2022-01-06 DIAGNOSIS — Z992 Dependence on renal dialysis: Secondary | ICD-10-CM | POA: Diagnosis not present

## 2022-01-08 DIAGNOSIS — Z23 Encounter for immunization: Secondary | ICD-10-CM | POA: Diagnosis not present

## 2022-01-08 DIAGNOSIS — N2581 Secondary hyperparathyroidism of renal origin: Secondary | ICD-10-CM | POA: Diagnosis not present

## 2022-01-08 DIAGNOSIS — Z992 Dependence on renal dialysis: Secondary | ICD-10-CM | POA: Diagnosis not present

## 2022-01-08 DIAGNOSIS — N186 End stage renal disease: Secondary | ICD-10-CM | POA: Diagnosis not present

## 2022-01-11 DIAGNOSIS — N2581 Secondary hyperparathyroidism of renal origin: Secondary | ICD-10-CM | POA: Diagnosis not present

## 2022-01-11 DIAGNOSIS — Z992 Dependence on renal dialysis: Secondary | ICD-10-CM | POA: Diagnosis not present

## 2022-01-11 DIAGNOSIS — N186 End stage renal disease: Secondary | ICD-10-CM | POA: Diagnosis not present

## 2022-01-11 DIAGNOSIS — Z23 Encounter for immunization: Secondary | ICD-10-CM | POA: Diagnosis not present

## 2022-01-12 DIAGNOSIS — I129 Hypertensive chronic kidney disease with stage 1 through stage 4 chronic kidney disease, or unspecified chronic kidney disease: Secondary | ICD-10-CM | POA: Diagnosis not present

## 2022-01-12 DIAGNOSIS — N186 End stage renal disease: Secondary | ICD-10-CM | POA: Diagnosis not present

## 2022-01-12 DIAGNOSIS — Z992 Dependence on renal dialysis: Secondary | ICD-10-CM | POA: Diagnosis not present

## 2022-01-13 DIAGNOSIS — Z992 Dependence on renal dialysis: Secondary | ICD-10-CM | POA: Diagnosis not present

## 2022-01-13 DIAGNOSIS — N186 End stage renal disease: Secondary | ICD-10-CM | POA: Diagnosis not present

## 2022-01-13 DIAGNOSIS — N2581 Secondary hyperparathyroidism of renal origin: Secondary | ICD-10-CM | POA: Diagnosis not present

## 2022-01-15 DIAGNOSIS — Z992 Dependence on renal dialysis: Secondary | ICD-10-CM | POA: Diagnosis not present

## 2022-01-15 DIAGNOSIS — N186 End stage renal disease: Secondary | ICD-10-CM | POA: Diagnosis not present

## 2022-01-15 DIAGNOSIS — N2581 Secondary hyperparathyroidism of renal origin: Secondary | ICD-10-CM | POA: Diagnosis not present

## 2022-01-18 DIAGNOSIS — N186 End stage renal disease: Secondary | ICD-10-CM | POA: Diagnosis not present

## 2022-01-18 DIAGNOSIS — Z992 Dependence on renal dialysis: Secondary | ICD-10-CM | POA: Diagnosis not present

## 2022-01-18 DIAGNOSIS — N2581 Secondary hyperparathyroidism of renal origin: Secondary | ICD-10-CM | POA: Diagnosis not present

## 2022-01-20 DIAGNOSIS — Z992 Dependence on renal dialysis: Secondary | ICD-10-CM | POA: Diagnosis not present

## 2022-01-20 DIAGNOSIS — N2581 Secondary hyperparathyroidism of renal origin: Secondary | ICD-10-CM | POA: Diagnosis not present

## 2022-01-20 DIAGNOSIS — N186 End stage renal disease: Secondary | ICD-10-CM | POA: Diagnosis not present

## 2022-01-22 DIAGNOSIS — Z992 Dependence on renal dialysis: Secondary | ICD-10-CM | POA: Diagnosis not present

## 2022-01-22 DIAGNOSIS — N2581 Secondary hyperparathyroidism of renal origin: Secondary | ICD-10-CM | POA: Diagnosis not present

## 2022-01-22 DIAGNOSIS — N186 End stage renal disease: Secondary | ICD-10-CM | POA: Diagnosis not present

## 2022-01-24 ENCOUNTER — Encounter: Payer: Self-pay | Admitting: Family Medicine

## 2022-01-24 ENCOUNTER — Ambulatory Visit (INDEPENDENT_AMBULATORY_CARE_PROVIDER_SITE_OTHER): Payer: Medicare Other | Admitting: Family Medicine

## 2022-01-24 ENCOUNTER — Ambulatory Visit: Payer: Medicare Other | Admitting: Family Medicine

## 2022-01-24 VITALS — BP 122/78 | HR 70 | Ht 72.0 in | Wt 177.0 lb

## 2022-01-24 DIAGNOSIS — F17210 Nicotine dependence, cigarettes, uncomplicated: Secondary | ICD-10-CM

## 2022-01-24 DIAGNOSIS — Z716 Tobacco abuse counseling: Secondary | ICD-10-CM | POA: Diagnosis not present

## 2022-01-24 MED ORDER — NICOTINE 7 MG/24HR TD PT24: 7.0000 mg | MEDICATED_PATCH | Freq: Every day | TRANSDERMAL | 0 refills | Status: DC

## 2022-01-24 NOTE — Progress Notes (Signed)
Date:  01/24/2022   Name:  Dwayne Reed   DOB:  [birth year]   MRN:  [id number]   Chief Complaint: Nicotine Dependence  Nicotine Dependence Presents for initial visit. Symptoms include cravings. Symptoms are negative for sore throat. Preferred tobacco types include cigarettes. Preferred cigarette types include filtered. Preferred strength is regular. Preferred cigarettes are menthol. Preferred brands include [employer]. His urge triggers include company of smokers. He smokes < 1/2 a pack of cigarettes per day. Past treatments include nicotine lozenge. Compliance with prior treatments has been variable.    Lab Results  Component Value Date   NA 140 07/20/2021   K 3.8 07/20/2021   CO2 28 07/20/2021   GLUCOSE 72 07/20/2021   BUN 21 07/20/2021   CREATININE 7.35 (H) 07/20/2021   CALCIUM 9.0 07/20/2021   EGFR 8 (L) 07/20/2021   GFRNONAA 6 (L) 02/26/2020   Lab Results  Component Value Date   CHOL 236 (H) 10/20/2020   HDL 43 10/20/2020   LDLCALC 123 (H) 10/20/2020   LDLDIRECT 152.0 03/27/2019   TRIG 396 (H) 10/20/2020   CHOLHDL 4.7 09/27/2019   No results found for: "TSH" No results found for: "HGBA1C" Lab Results  Component Value Date   WBC 5.5 07/20/2021   HGB 14.3 07/20/2021   HCT 41.6 07/20/2021   MCV 97 07/20/2021   PLT 175 07/20/2021   Lab Results  Component Value Date   ALT 9 02/25/2020   AST 7 (L) 02/25/2020   ALKPHOS 82 02/25/2020   BILITOT 0.8 02/25/2020   Lab Results  Component Value Date   VD25OH 34 09/27/2019     Review of Systems  Constitutional:  Negative for chills and fever.  HENT:  Negative for drooling, ear discharge, ear pain and sore throat.   Respiratory:  Negative for cough, shortness of breath and wheezing.   Cardiovascular:  Negative for chest pain.  Gastrointestinal:  Negative for abdominal pain.  Musculoskeletal:  Negative for myalgias and neck pain.  Skin:  Negative for rash.  Hematological:  Does not bruise/bleed easily.   Psychiatric/Behavioral:  Negative for suicidal ideas. The patient is not nervous/anxious.     Patient Active Problem List   Diagnosis Date Noted   Allergy, unspecified, initial encounter 11/18/2021   Anaphylactic shock, unspecified, initial encounter 11/18/2021   Abnormal ECG 08/19/2021   Hyperlipidemia, mixed 08/19/2021   Hypercalcemia 09/23/2020   Urinary tract infection with hematuria    Hydronephrosis with renal and ureteral calculus obstruction 02/24/2020   Breakdown of surgically created AV fistula, init ([place]) 10/23/2017   Decreased energy 10/23/2017   Mild protein-calorie malnutrition ([place]) 08/18/2017   Anemia in chronic kidney disease 07/29/2017   Coagulation defect, unspecified ([place]) 07/29/2017   Iron deficiency anemia, unspecified 07/29/2017   Secondary hyperparathyroidism of renal origin ([place]) 07/29/2017   ESRD on dialysis ([place]) 07/26/2017   SOB (shortness of breath) 07/25/2017   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) 09/18/2015   Benign prostatic hyperplasia with lower urinary tract symptoms 08/05/2015   Pre-transplant evaluation for kidney transplant 08/05/2015   Acquired arteriovenous fistula ([place]) 04/30/2015   ESRD on hemodialysis ([place]) 02/04/2015   Hyperparathyroidism ([place]) 02/04/2015   Hyperphosphatemia 02/04/2015   Anemia of renal disease 02/04/2015   History of transfusion 12/13/2014   Elevated prostate specific antigen (PSA) 08/18/2012   Essential hypertension 08/18/2012  Gout 08/18/2012   Acquired cyst of kidney 04/13/2012    Allergies  Allergen Reactions   Sensipar [Cinacalcet]     Other reaction(s): Unknown    Past Surgical History:  Procedure Laterality Date   CYSTOSCOPY/URETEROSCOPY/HOLMIUM LASER/STENT PLACEMENT N/A 02/25/2020   Procedure:  CYSTOSCOPY/URETEROSCOPY/HOLMIUM LASER/STENT PLACEMENT;  Surgeon: Abbie Sons, MD;  Location: [facility] ORS;  Service: Urology;  Laterality: N/A;   CYSTOSCOPY/URETEROSCOPY/HOLMIUM LASER/STENT PLACEMENT Right 03/09/2020   Procedure: CYSTOSCOPY/URETEROSCOPY/HOLMIUM LASER/STENT Exchange;  Surgeon: Abbie Sons, MD;  Location: [facility] ORS;  Service: Urology;  Laterality: Right;   EXTRACORPOREAL SHOCK WAVE LITHOTRIPSY     LITHOTRIPSY     x 5   PROSTATE BIOPSY      Social History   Tobacco Use   Smoking status: Every Day    Packs/day: 0.50    Years: 30.00    Total pack years: 15.00    Types: Cigarettes   Smokeless tobacco: Never  Vaping Use   Vaping Use: Never used  Substance Use Topics   Alcohol use: Yes    Comment: on holidays   Drug use: Never     Medication list has been reviewed and updated.  No outpatient medications have been marked as taking for the 01/24/22 encounter (Office Visit) with Juline Patch, MD.       01/24/2022    1:17 PM 07/20/2021    1:20 PM 10/20/2020    2:41 PM  GAD 7 : Generalized Anxiety Score  Nervous, Anxious, on Edge 0 0 0  Control/stop worrying 0 0 0  Worry too much - different things 0 0 0  Trouble relaxing 0 0 0  Restless 0 0 0  Easily annoyed or irritable 1 0 1  Afraid - awful might happen 0 0 0  Total GAD 7 Score 1 0 1  Anxiety Difficulty Not difficult at all Not difficult at all Not difficult at all       01/24/2022    1:17 PM 07/20/2021    1:20 PM 10/20/2020    2:41 PM  Depression screen PHQ 2/9  Decreased Interest 0 0 0  Down, Depressed, Hopeless 0 0 0  PHQ - 2 Score 0 0 0  Altered sleeping 0 0 0  Tired, decreased energy 0 1 0  Change in appetite 0 1 1  Feeling bad or failure about yourself  0 0 0  Trouble concentrating 0 0 0  Moving slowly or fidgety/restless 0 0 0  Suicidal thoughts 0 0 0  PHQ-9 Score 0 2 1  Difficult doing work/chores Not difficult at all Not difficult at all Not difficult at all    BP Readings from Last  3 Encounters:  01/24/22 122/78  12/22/21 119/74  10/21/21 98/67    Physical Exam Vitals reviewed.  HENT:     Head: Normocephalic.     Right Ear: Tympanic membrane normal.     Left Ear: Tympanic membrane normal.     Nose: Nose normal.     Mouth/Throat:     Mouth: Mucous membranes are moist.  Eyes:     Extraocular Movements: Extraocular movements intact.     Conjunctiva/sclera: Conjunctivae normal.     Pupils: Pupils are equal, round, and reactive to light.  Cardiovascular:     Heart sounds: No murmur heard.    No gallop.  Pulmonary:     Breath sounds: No wheezing, rhonchi or rales.  Musculoskeletal:        General: No deformity or signs of injury.  Neurological:     Mental Status: He is alert.     Wt Readings from Last 3 Encounters:  01/24/22 177 lb (80.3 kg)  12/22/21 185 lb (83.9 kg)  10/21/21 175 lb (79.4 kg)    BP 122/78   Pulse 70   Ht 6' (1.829 m)   Wt 177 lb (80.3 kg)   SpO2 99%   BMI 24.01 kg/m   Assessment and Plan:  1. Encounter for smoking cessation counseling Patient presents for smoking cessation.  Apparently has tried this in the past with out success with nicotine lounges.  Unfortunately patient is discontinuing smoking for the wrong reason that is because they are charging him $60 a month because he smokes.  I pointed out that if he has not decided on his own to cease smoking that this is somewhat of a doomed to failure endeavor.  We will provide nicotine patches at 7 mg seen that the patient is only smoking 4 cigarettes a day that he admits to.  Patient will return in a month and we will reassess success. - nicotine (NICODERM CQ - DOSED IN MG/24 HR) 7 mg/24hr patch; Place 1 patch (7 mg total) onto the skin daily.  Dispense: 28 patch; Refill: 0  2. Cigarette nicotine dependence without complication Patient only smokes 4 cigarettes a day by his accounting.  However patient did not know that he could not smoke and wear patches at the same time and this  is somewhat concerning.  He has been instructed that he has not smoked with patches in place. - nicotine (NICODERM CQ - DOSED IN MG/24 HR) 7 mg/24hr patch; Place 1 patch (7 mg total) onto the skin daily.  Dispense: 28 patch; Refill: 0    Otilio Miu, MD

## 2022-01-25 DIAGNOSIS — N186 End stage renal disease: Secondary | ICD-10-CM | POA: Diagnosis not present

## 2022-01-25 DIAGNOSIS — N2581 Secondary hyperparathyroidism of renal origin: Secondary | ICD-10-CM | POA: Diagnosis not present

## 2022-01-25 DIAGNOSIS — Z992 Dependence on renal dialysis: Secondary | ICD-10-CM | POA: Diagnosis not present

## 2022-01-27 DIAGNOSIS — N2581 Secondary hyperparathyroidism of renal origin: Secondary | ICD-10-CM | POA: Diagnosis not present

## 2022-01-27 DIAGNOSIS — N186 End stage renal disease: Secondary | ICD-10-CM | POA: Diagnosis not present

## 2022-01-27 DIAGNOSIS — Z992 Dependence on renal dialysis: Secondary | ICD-10-CM | POA: Diagnosis not present

## 2022-01-29 DIAGNOSIS — N2581 Secondary hyperparathyroidism of renal origin: Secondary | ICD-10-CM | POA: Diagnosis not present

## 2022-01-29 DIAGNOSIS — N186 End stage renal disease: Secondary | ICD-10-CM | POA: Diagnosis not present

## 2022-01-29 DIAGNOSIS — Z992 Dependence on renal dialysis: Secondary | ICD-10-CM | POA: Diagnosis not present

## 2022-01-31 DIAGNOSIS — N2581 Secondary hyperparathyroidism of renal origin: Secondary | ICD-10-CM | POA: Diagnosis not present

## 2022-01-31 DIAGNOSIS — Z992 Dependence on renal dialysis: Secondary | ICD-10-CM | POA: Diagnosis not present

## 2022-01-31 DIAGNOSIS — N186 End stage renal disease: Secondary | ICD-10-CM | POA: Diagnosis not present

## 2022-02-02 DIAGNOSIS — N2581 Secondary hyperparathyroidism of renal origin: Secondary | ICD-10-CM | POA: Diagnosis not present

## 2022-02-02 DIAGNOSIS — N186 End stage renal disease: Secondary | ICD-10-CM | POA: Diagnosis not present

## 2022-02-02 DIAGNOSIS — Z992 Dependence on renal dialysis: Secondary | ICD-10-CM | POA: Diagnosis not present

## 2022-02-05 DIAGNOSIS — N186 End stage renal disease: Secondary | ICD-10-CM | POA: Diagnosis not present

## 2022-02-05 DIAGNOSIS — Z992 Dependence on renal dialysis: Secondary | ICD-10-CM | POA: Diagnosis not present

## 2022-02-05 DIAGNOSIS — N2581 Secondary hyperparathyroidism of renal origin: Secondary | ICD-10-CM | POA: Diagnosis not present

## 2022-02-08 DIAGNOSIS — N2581 Secondary hyperparathyroidism of renal origin: Secondary | ICD-10-CM | POA: Diagnosis not present

## 2022-02-08 DIAGNOSIS — Z992 Dependence on renal dialysis: Secondary | ICD-10-CM | POA: Diagnosis not present

## 2022-02-08 DIAGNOSIS — N186 End stage renal disease: Secondary | ICD-10-CM | POA: Diagnosis not present

## 2022-02-10 DIAGNOSIS — N2581 Secondary hyperparathyroidism of renal origin: Secondary | ICD-10-CM | POA: Diagnosis not present

## 2022-02-10 DIAGNOSIS — Z992 Dependence on renal dialysis: Secondary | ICD-10-CM | POA: Diagnosis not present

## 2022-02-10 DIAGNOSIS — N186 End stage renal disease: Secondary | ICD-10-CM | POA: Diagnosis not present

## 2022-02-11 DIAGNOSIS — Z992 Dependence on renal dialysis: Secondary | ICD-10-CM | POA: Diagnosis not present

## 2022-02-11 DIAGNOSIS — N186 End stage renal disease: Secondary | ICD-10-CM | POA: Diagnosis not present

## 2022-02-11 DIAGNOSIS — I129 Hypertensive chronic kidney disease with stage 1 through stage 4 chronic kidney disease, or unspecified chronic kidney disease: Secondary | ICD-10-CM | POA: Diagnosis not present

## 2022-02-12 DIAGNOSIS — N186 End stage renal disease: Secondary | ICD-10-CM | POA: Diagnosis not present

## 2022-02-12 DIAGNOSIS — D631 Anemia in chronic kidney disease: Secondary | ICD-10-CM | POA: Diagnosis not present

## 2022-02-12 DIAGNOSIS — Z992 Dependence on renal dialysis: Secondary | ICD-10-CM | POA: Diagnosis not present

## 2022-02-12 DIAGNOSIS — N2581 Secondary hyperparathyroidism of renal origin: Secondary | ICD-10-CM | POA: Diagnosis not present

## 2022-02-15 DIAGNOSIS — N2581 Secondary hyperparathyroidism of renal origin: Secondary | ICD-10-CM | POA: Diagnosis not present

## 2022-02-15 DIAGNOSIS — N186 End stage renal disease: Secondary | ICD-10-CM | POA: Diagnosis not present

## 2022-02-15 DIAGNOSIS — Z992 Dependence on renal dialysis: Secondary | ICD-10-CM | POA: Diagnosis not present

## 2022-02-15 DIAGNOSIS — D631 Anemia in chronic kidney disease: Secondary | ICD-10-CM | POA: Diagnosis not present

## 2022-02-18 DIAGNOSIS — N186 End stage renal disease: Secondary | ICD-10-CM | POA: Diagnosis not present

## 2022-02-18 DIAGNOSIS — D631 Anemia in chronic kidney disease: Secondary | ICD-10-CM | POA: Diagnosis not present

## 2022-02-18 DIAGNOSIS — N2581 Secondary hyperparathyroidism of renal origin: Secondary | ICD-10-CM | POA: Diagnosis not present

## 2022-02-18 DIAGNOSIS — Z992 Dependence on renal dialysis: Secondary | ICD-10-CM | POA: Diagnosis not present

## 2022-02-22 DIAGNOSIS — D631 Anemia in chronic kidney disease: Secondary | ICD-10-CM | POA: Diagnosis not present

## 2022-02-22 DIAGNOSIS — N2581 Secondary hyperparathyroidism of renal origin: Secondary | ICD-10-CM | POA: Diagnosis not present

## 2022-02-22 DIAGNOSIS — N186 End stage renal disease: Secondary | ICD-10-CM | POA: Diagnosis not present

## 2022-02-22 DIAGNOSIS — Z992 Dependence on renal dialysis: Secondary | ICD-10-CM | POA: Diagnosis not present

## 2022-02-24 DIAGNOSIS — D631 Anemia in chronic kidney disease: Secondary | ICD-10-CM | POA: Diagnosis not present

## 2022-02-24 DIAGNOSIS — N186 End stage renal disease: Secondary | ICD-10-CM | POA: Diagnosis not present

## 2022-02-24 DIAGNOSIS — Z992 Dependence on renal dialysis: Secondary | ICD-10-CM | POA: Diagnosis not present

## 2022-02-24 DIAGNOSIS — N2581 Secondary hyperparathyroidism of renal origin: Secondary | ICD-10-CM | POA: Diagnosis not present

## 2022-02-26 DIAGNOSIS — D631 Anemia in chronic kidney disease: Secondary | ICD-10-CM | POA: Diagnosis not present

## 2022-02-26 DIAGNOSIS — N2581 Secondary hyperparathyroidism of renal origin: Secondary | ICD-10-CM | POA: Diagnosis not present

## 2022-02-26 DIAGNOSIS — N186 End stage renal disease: Secondary | ICD-10-CM | POA: Diagnosis not present

## 2022-02-26 DIAGNOSIS — Z992 Dependence on renal dialysis: Secondary | ICD-10-CM | POA: Diagnosis not present

## 2022-03-01 DIAGNOSIS — D631 Anemia in chronic kidney disease: Secondary | ICD-10-CM | POA: Diagnosis not present

## 2022-03-01 DIAGNOSIS — N186 End stage renal disease: Secondary | ICD-10-CM | POA: Diagnosis not present

## 2022-03-01 DIAGNOSIS — N2581 Secondary hyperparathyroidism of renal origin: Secondary | ICD-10-CM | POA: Diagnosis not present

## 2022-03-01 DIAGNOSIS — Z992 Dependence on renal dialysis: Secondary | ICD-10-CM | POA: Diagnosis not present

## 2022-03-03 DIAGNOSIS — N186 End stage renal disease: Secondary | ICD-10-CM | POA: Diagnosis not present

## 2022-03-03 DIAGNOSIS — D631 Anemia in chronic kidney disease: Secondary | ICD-10-CM | POA: Diagnosis not present

## 2022-03-03 DIAGNOSIS — Z992 Dependence on renal dialysis: Secondary | ICD-10-CM | POA: Diagnosis not present

## 2022-03-03 DIAGNOSIS — N2581 Secondary hyperparathyroidism of renal origin: Secondary | ICD-10-CM | POA: Diagnosis not present

## 2022-03-05 DIAGNOSIS — D631 Anemia in chronic kidney disease: Secondary | ICD-10-CM | POA: Diagnosis not present

## 2022-03-05 DIAGNOSIS — N2581 Secondary hyperparathyroidism of renal origin: Secondary | ICD-10-CM | POA: Diagnosis not present

## 2022-03-05 DIAGNOSIS — N186 End stage renal disease: Secondary | ICD-10-CM | POA: Diagnosis not present

## 2022-03-05 DIAGNOSIS — Z992 Dependence on renal dialysis: Secondary | ICD-10-CM | POA: Diagnosis not present

## 2022-03-08 DIAGNOSIS — N2581 Secondary hyperparathyroidism of renal origin: Secondary | ICD-10-CM | POA: Diagnosis not present

## 2022-03-08 DIAGNOSIS — N186 End stage renal disease: Secondary | ICD-10-CM | POA: Diagnosis not present

## 2022-03-08 DIAGNOSIS — D631 Anemia in chronic kidney disease: Secondary | ICD-10-CM | POA: Diagnosis not present

## 2022-03-08 DIAGNOSIS — Z992 Dependence on renal dialysis: Secondary | ICD-10-CM | POA: Diagnosis not present

## 2022-03-09 ENCOUNTER — Telehealth: Payer: Self-pay | Admitting: Family Medicine

## 2022-03-09 ENCOUNTER — Encounter: Payer: Self-pay | Admitting: Family Medicine

## 2022-03-09 ENCOUNTER — Ambulatory Visit (INDEPENDENT_AMBULATORY_CARE_PROVIDER_SITE_OTHER): Payer: Medicare Other | Admitting: Family Medicine

## 2022-03-09 VITALS — BP 120/76 | HR 78 | Ht 72.0 in | Wt 180.0 lb

## 2022-03-09 DIAGNOSIS — D509 Iron deficiency anemia, unspecified: Secondary | ICD-10-CM

## 2022-03-09 DIAGNOSIS — Z0000 Encounter for general adult medical examination without abnormal findings: Secondary | ICD-10-CM

## 2022-03-09 DIAGNOSIS — R972 Elevated prostate specific antigen [PSA]: Secondary | ICD-10-CM

## 2022-03-09 LAB — HEMOCCULT GUIAC POC 1CARD (OFFICE): Fecal Occult Blood, POC: NEGATIVE — AB

## 2022-03-09 NOTE — Telephone Encounter (Signed)
Baxter Flattery from [facility] PCP called and states this patient was suppose to have a CT for kidney stones in May be I see the order was never placed. He has not followed up with that. Also he had a PSA score 12.1 on 10/20/2020. He hasn't had one since but there office put 1 in today. Looks like he has not also followed up on prostate cancer.

## 2022-03-09 NOTE — Progress Notes (Signed)
Date:  03/09/2022   Name:  Dwayne Reed   DOB:  1955/10/21   MRN:  [id number]   Chief Complaint: Annual Exam  Patient is a 66 year old male who presents for a comprehensive physical exam. The patient reports the following problems: none. Health maintenance has been reviewed as noted.      Lab Results  Component Value Date   NA 140 07/20/2021   K 3.8 07/20/2021   CO2 28 07/20/2021   GLUCOSE 72 07/20/2021   BUN 21 07/20/2021   CREATININE 7.35 (H) 07/20/2021   CALCIUM 9.0 07/20/2021   EGFR 8 (L) 07/20/2021   GFRNONAA 6 (L) 02/26/2020   Lab Results  Component Value Date   CHOL 236 (H) 10/20/2020   HDL 43 10/20/2020   LDLCALC 123 (H) 10/20/2020   LDLDIRECT 152.0 03/27/2019   TRIG 396 (H) 10/20/2020   CHOLHDL 4.7 09/27/2019   No results found for: "TSH" No results found for: "HGBA1C" Lab Results  Component Value Date   WBC 5.5 07/20/2021   HGB 14.3 07/20/2021   HCT 41.6 07/20/2021   MCV 97 07/20/2021   PLT 175 07/20/2021   Lab Results  Component Value Date   ALT 9 02/25/2020   AST 7 (L) 02/25/2020   ALKPHOS 82 02/25/2020   BILITOT 0.8 02/25/2020   Lab Results  Component Value Date   VD25OH 34 09/27/2019     Review of Systems  Constitutional:  Negative for fatigue, fever and unexpected weight change.  HENT:  Negative for ear pain and trouble swallowing.   Eyes:  Negative for visual disturbance.  Respiratory:  Negative for chest tightness, shortness of breath and wheezing.   Cardiovascular:  Negative for chest pain, palpitations and leg swelling.  Gastrointestinal:  Positive for blood in stool. Negative for abdominal pain.  Endocrine: Negative for polydipsia and polyuria.  Genitourinary:  Negative for difficulty urinating.  Musculoskeletal:  Negative for arthralgias and back pain.  Neurological:  Negative for dizziness and numbness.  Hematological:  Negative for adenopathy. Does not bruise/bleed easily.    Patient Active Problem List   Diagnosis Date  Noted   Allergy, unspecified, initial encounter 11/18/2021   Anaphylactic shock, unspecified, initial encounter 11/18/2021   Abnormal ECG 08/19/2021   Hyperlipidemia, mixed 08/19/2021   Hypercalcemia 09/23/2020   Urinary tract infection with hematuria    Hydronephrosis with renal and ureteral calculus obstruction 02/24/2020   Breakdown of surgically created AV fistula, init ([place]) 10/23/2017   Decreased energy 10/23/2017   Mild protein-calorie malnutrition ([place]) 08/18/2017   Anemia in chronic kidney disease 07/29/2017   Coagulation defect, unspecified ([place]) 07/29/2017   Iron deficiency anemia, unspecified 07/29/2017   Secondary hyperparathyroidism of renal origin ([place]) 07/29/2017   ESRD on dialysis ([place]) 07/26/2017   SOB (shortness of breath) 07/25/2017   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) 02/04/2015   Hyperparathyroidism ([place]) 02/04/2015   Hyperphosphatemia 02/04/2015   Anemia of renal disease 02/04/2015   History of transfusion 12/13/2014   Elevated prostate specific antigen (PSA) 08/18/2012   Essential hypertension 08/18/2012   Gout 08/18/2012   Acquired cyst of kidney 04/13/2012  Allergies  Allergen Reactions   Sensipar [Cinacalcet]     Other reaction(s): Unknown    Past Surgical History:  Procedure Laterality Date   CYSTOSCOPY/URETEROSCOPY/HOLMIUM LASER/STENT PLACEMENT N/A 02/25/2020   Procedure: CYSTOSCOPY/URETEROSCOPY/HOLMIUM LASER/STENT PLACEMENT;  Surgeon: Abbie Sons, MD;  Location: [facility] ORS;  Service: Urology;  Laterality: N/A;    CYSTOSCOPY/URETEROSCOPY/HOLMIUM LASER/STENT PLACEMENT Right 03/09/2020   Procedure: CYSTOSCOPY/URETEROSCOPY/HOLMIUM LASER/STENT Exchange;  Surgeon: Abbie Sons, MD;  Location: [facility] ORS;  Service: Urology;  Laterality: Right;   EXTRACORPOREAL SHOCK WAVE LITHOTRIPSY     LITHOTRIPSY     x 5   PROSTATE BIOPSY      Social History   Tobacco Use   Smoking status: Every Day    Packs/day: 0.50    Years: 30.00    Total pack years: 15.00    Types: Cigarettes   Smokeless tobacco: Never  Vaping Use   Vaping Use: Never used  Substance Use Topics   Alcohol use: Yes    Comment: on holidays   Drug use: Never     Medication list has been reviewed and updated.  Current Meds  Medication Sig   AURYXIA 1 GM 210 MG(Fe) tablet Take 3 tablets (630 mg total) by mouth 3 (three) times daily.   Multiple Vitamins-Minerals (ZINC PO) Take by mouth.       03/09/2022    8:50 AM 01/24/2022    1:17 PM 07/20/2021    1:20 PM 10/20/2020    2:41 PM  GAD 7 : Generalized Anxiety Score  Nervous, Anxious, on Edge 0 0 0 0  Control/stop worrying 0 0 0 0  Worry too much - different things 0 0 0 0  Trouble relaxing 0 0 0 0  Restless 0 0 0 0  Easily annoyed or irritable 0 1 0 1  Afraid - awful might happen 0 0 0 0  Total GAD 7 Score 0 1 0 1  Anxiety Difficulty Not difficult at all Not difficult at all Not difficult at all Not difficult at all       03/09/2022    8:50 AM 01/24/2022    1:17 PM 07/20/2021    1:20 PM  Depression screen PHQ 2/9  Decreased Interest 0 0 0  Down, Depressed, Hopeless 0 0 0  PHQ - 2 Score 0 0 0  Altered sleeping 0 0 0  Tired, decreased energy 0 0 1  Change in appetite 0 0 1  Feeling bad or failure about yourself  0 0 0  Trouble concentrating 0 0 0  Moving slowly or fidgety/restless 0 0 0  Suicidal thoughts 0 0 0  PHQ-9 Score 0 0 2  Difficult doing work/chores Not difficult at all Not difficult at all Not difficult at all    BP Readings from Last 3 Encounters:  03/09/22  120/76  01/24/22 122/78  12/22/21 119/74    Physical Exam Vitals and nursing note reviewed.  Constitutional:      Appearance: Normal appearance. He is normal weight.  HENT:     Head: Normocephalic.     Jaw: There is normal jaw occlusion.     Right Ear: Hearing, tympanic membrane, ear canal and external ear normal.     Left Ear: Hearing, tympanic membrane, ear canal and external ear normal.     Nose: Nose normal.     Mouth/Throat:     Lips: Pink.     Mouth: Mucous membranes are moist. Mucous membranes are pale.     Dentition: Abnormal dentition.  Tongue: No lesions.     Palate: No mass.     Pharynx: Oropharynx is clear.  Eyes:     General: Lids are normal. Vision grossly intact. Gaze aligned appropriately. No scleral icterus.       Right eye: No discharge.        Left eye: No discharge.     Extraocular Movements: Extraocular movements intact.     Conjunctiva/sclera: Conjunctivae normal.     Pupils: Pupils are equal, round, and reactive to light.  Neck:     Thyroid: No thyroid mass, thyromegaly or thyroid tenderness.     Vascular: Normal carotid pulses. No carotid bruit, hepatojugular reflux or JVD.     Trachea: Trachea and phonation normal. No tracheal deviation.  Cardiovascular:     Rate and Rhythm: Normal rate and regular rhythm.     Chest Wall: PMI is not displaced.     Pulses: Normal pulses.     Heart sounds: Normal heart sounds, S1 normal and S2 normal. No murmur heard.    No systolic murmur is present.     No diastolic murmur is present.     No friction rub. No gallop. No S3 or S4 sounds.  Pulmonary:     Effort: Pulmonary effort is normal. No respiratory distress.     Breath sounds: Normal breath sounds. No decreased breath sounds, wheezing, rhonchi or rales.  Chest:  Breasts:    Right: Normal. No mass.     Left: Normal. No mass.  Abdominal:     General: Bowel sounds are normal.     Palpations: Abdomen is soft. There is no hepatomegaly, splenomegaly or mass.      Tenderness: There is no abdominal tenderness. There is no right CVA tenderness, left CVA tenderness, guarding or rebound.     Hernia: There is no hernia in the umbilical area or ventral area.  Genitourinary:    Penis: Normal and uncircumcised.      Testes: Normal.        Right: Mass not present.        Left: Mass not present.     Prostate: Enlarged. Not tender and no nodules present.     Rectum: Normal. Guaiac result negative. No mass.     Comments: Some darkening of the stool specimen which resembles more of an iron deposition.  Guaiac is negative. Musculoskeletal:        General: No tenderness. Normal range of motion.     Cervical back: Normal, full passive range of motion without pain, normal range of motion and neck supple.     Thoracic back: Normal.     Lumbar back: Normal.     Right lower leg: No edema.     Left lower leg: No edema.  Lymphadenopathy:     Head:     Right side of head: No submandibular or tonsillar adenopathy.     Left side of head: No submandibular or tonsillar adenopathy.     Cervical: No cervical adenopathy.     Right cervical: No superficial, deep or posterior cervical adenopathy.    Left cervical: No superficial, deep or posterior cervical adenopathy.     Upper Body:     Right upper body: No supraclavicular or axillary adenopathy.     Left upper body: No supraclavicular or axillary adenopathy.  Skin:    General: Skin is warm.     Findings: No rash.  Neurological:     General: No focal deficit present.  Mental Status: He is alert and oriented to person, place, and time.     Cranial Nerves: No cranial nerve deficit.     Sensory: Sensation is intact.     Motor: Motor function is intact.     Coordination: Coordination is intact.     Deep Tendon Reflexes: Reflexes are normal and symmetric.  Psychiatric:        Behavior: Behavior is cooperative.     Wt Readings from Last 3 Encounters:  03/09/22 180 lb (81.6 kg)  01/24/22 177 lb (80.3 kg)   12/22/21 185 lb (83.9 kg)    BP 120/76   Pulse 78   Ht 6' (1.829 m)   Wt 180 lb (81.6 kg)   SpO2 98%   BMI 24.41 kg/m   Assessment and Plan: Dwayne Reed is a 66 y.o. male who presents today for his Complete Annual Exam. He feels well. He reports exercising "need to start". He reports he is sleeping well.  Immunizations are reviewed and recommendations provided.   Age appropriate screening tests are discussed. Counseling given for risk factor reduction interventions.  1. Annual physical exam No subjective/objective concerns noted during HPI, review of systems, review of past medical history and medications, and physical exam.  Will obtain lipid panel PSA CMP with CBC. - POCT Occult Blood Stool - Lipid Panel With LDL/HDL Ratio - PSA - Comprehensive Metabolic Panel (CMET) - CBC with Differential/Platelet  2. Iron deficiency anemia, unspecified iron deficiency anemia type Patient has history of iron deficiency and is awaiting to have a repeat of his colonoscopy by a Dr. Rosana Hoes.  In the meantime we will check CBC to see if there is any further decrease in anemia.  Patient does have a history of renal disease which is likely contributing to this as well. - CBC with Differential/Platelet  3. Elevated prostate specific antigen (PSA) Patient is followed by urology/Anna McGowan for PSA and enlarged prostate.  We will send Larene Beach a message concerning that he has not heard about the CT scan that needs to be done for multiple stones and follow-up for prostate.  On review of his urology there is some concern for prostate cancer and we will also bring this to the attention of urology whether the patient needs to be returning for follow-up or whether this may be some noncompliance on his part - PSA  Patient is followed by Dr. Posey Pronto for his stage V CKD most recently was seen the week before the holidays and is having dialysis at this time as well.  Patient's been encouraged to continue with nephrology  evaluations in the future.  Patient saw Dr. Nehemiah Massed who basically released him and that his blood pressure was excellent and his readings on his lipids were not necessary to increase since he had good readings of HDL which countered his elevated LDLs.  Otilio Miu, MD

## 2022-03-10 DIAGNOSIS — N2581 Secondary hyperparathyroidism of renal origin: Secondary | ICD-10-CM | POA: Diagnosis not present

## 2022-03-10 DIAGNOSIS — D631 Anemia in chronic kidney disease: Secondary | ICD-10-CM | POA: Diagnosis not present

## 2022-03-10 DIAGNOSIS — Z992 Dependence on renal dialysis: Secondary | ICD-10-CM | POA: Diagnosis not present

## 2022-03-10 DIAGNOSIS — N186 End stage renal disease: Secondary | ICD-10-CM | POA: Diagnosis not present

## 2022-03-10 LAB — CBC WITH DIFFERENTIAL/PLATELET
Basophils Absolute: 0 10*3/uL (ref 0.0–0.2)
Basos: 1 %
EOS (ABSOLUTE): 0.4 10*3/uL (ref 0.0–0.4)
Eos: 10 %
Hematocrit: 36.6 % — ABNORMAL LOW (ref 37.5–51.0)
Hemoglobin: 12.2 g/dL — ABNORMAL LOW (ref 13.0–17.7)
Immature Grans (Abs): 0 10*3/uL (ref 0.0–0.1)
Immature Granulocytes: 0 %
Lymphocytes Absolute: 0.9 10*3/uL (ref 0.7–3.1)
Lymphs: 20 %
MCH: 32.6 pg (ref 26.6–33.0)
MCHC: 33.3 g/dL (ref 31.5–35.7)
MCV: 98 fL — ABNORMAL HIGH (ref 79–97)
Monocytes Absolute: 0.6 10*3/uL (ref 0.1–0.9)
Monocytes: 14 %
Neutrophils Absolute: 2.4 10*3/uL (ref 1.4–7.0)
Neutrophils: 55 %
Platelets: 130 10*3/uL — ABNORMAL LOW (ref 150–450)
RBC: 3.74 x10E6/uL — ABNORMAL LOW (ref 4.14–5.80)
RDW: 13.6 % (ref 11.6–15.4)
WBC: 4.3 10*3/uL (ref 3.4–10.8)

## 2022-03-10 LAB — LIPID PANEL WITH LDL/HDL RATIO
Cholesterol, Total: 195 mg/dL (ref 100–199)
HDL: 47 mg/dL (ref >39)
LDL Chol Calc (NIH): 129 mg/dL — ABNORMAL HIGH (ref 0–99)
LDL/HDL Ratio: 2.7 ratio (ref 0.0–3.6)
Triglycerides: 105 mg/dL (ref 0–149)
VLDL Cholesterol Cal: 19 mg/dL (ref 5–40)

## 2022-03-10 LAB — COMPREHENSIVE METABOLIC PANEL
ALT: 14 IU/L (ref 0–44)
AST: 10 IU/L (ref 0–40)
Albumin/Globulin Ratio: 1.6 (ref 1.2–2.2)
Albumin: 4.2 g/dL (ref 3.9–4.9)
Alkaline Phosphatase: 82 IU/L (ref 44–121)
BUN/Creatinine Ratio: 4 — ABNORMAL LOW (ref 10–24)
BUN: 39 mg/dL — ABNORMAL HIGH (ref 8–27)
Bilirubin Total: 0.4 mg/dL (ref 0.0–1.2)
CO2: 25 mmol/L (ref 20–29)
Calcium: 9.1 mg/dL (ref 8.6–10.2)
Chloride: 96 mmol/L (ref 96–106)
Creatinine, Ser: 10.55 mg/dL — ABNORMAL HIGH (ref 0.76–1.27)
Globulin, Total: 2.6 g/dL (ref 1.5–4.5)
Glucose: 86 mg/dL (ref 70–99)
Potassium: 4.3 mmol/L (ref 3.5–5.2)
Sodium: 146 mmol/L — ABNORMAL HIGH (ref 134–144)
Total Protein: 6.8 g/dL (ref 6.0–8.5)
eGFR: 5 mL/min/{1.73_m2} — ABNORMAL LOW (ref >59)

## 2022-03-10 LAB — PSA: Prostate Specific Ag, Serum: 11.5 ng/mL — ABNORMAL HIGH (ref 0.0–4.0)

## 2022-03-12 DIAGNOSIS — N186 End stage renal disease: Secondary | ICD-10-CM | POA: Diagnosis not present

## 2022-03-12 DIAGNOSIS — D631 Anemia in chronic kidney disease: Secondary | ICD-10-CM | POA: Diagnosis not present

## 2022-03-12 DIAGNOSIS — Z992 Dependence on renal dialysis: Secondary | ICD-10-CM | POA: Diagnosis not present

## 2022-03-12 DIAGNOSIS — N2581 Secondary hyperparathyroidism of renal origin: Secondary | ICD-10-CM | POA: Diagnosis not present

## 2022-03-14 DIAGNOSIS — Z992 Dependence on renal dialysis: Secondary | ICD-10-CM | POA: Diagnosis not present

## 2022-03-14 DIAGNOSIS — I129 Hypertensive chronic kidney disease with stage 1 through stage 4 chronic kidney disease, or unspecified chronic kidney disease: Secondary | ICD-10-CM | POA: Diagnosis not present

## 2022-03-14 DIAGNOSIS — N186 End stage renal disease: Secondary | ICD-10-CM | POA: Diagnosis not present

## 2022-03-15 DIAGNOSIS — N186 End stage renal disease: Secondary | ICD-10-CM | POA: Diagnosis not present

## 2022-03-15 DIAGNOSIS — Z992 Dependence on renal dialysis: Secondary | ICD-10-CM | POA: Diagnosis not present

## 2022-03-15 DIAGNOSIS — N2581 Secondary hyperparathyroidism of renal origin: Secondary | ICD-10-CM | POA: Diagnosis not present

## 2022-03-16 NOTE — Telephone Encounter (Signed)
Left message on machine informing patient he needs to have a follow up appointment to discuss the latest PSA score from PCP.

## 2022-03-17 DIAGNOSIS — N2581 Secondary hyperparathyroidism of renal origin: Secondary | ICD-10-CM | POA: Diagnosis not present

## 2022-03-17 DIAGNOSIS — N186 End stage renal disease: Secondary | ICD-10-CM | POA: Diagnosis not present

## 2022-03-17 DIAGNOSIS — Z992 Dependence on renal dialysis: Secondary | ICD-10-CM | POA: Diagnosis not present

## 2022-03-19 DIAGNOSIS — N186 End stage renal disease: Secondary | ICD-10-CM | POA: Diagnosis not present

## 2022-03-19 DIAGNOSIS — Z992 Dependence on renal dialysis: Secondary | ICD-10-CM | POA: Diagnosis not present

## 2022-03-19 DIAGNOSIS — N2581 Secondary hyperparathyroidism of renal origin: Secondary | ICD-10-CM | POA: Diagnosis not present

## 2022-03-22 DIAGNOSIS — N2581 Secondary hyperparathyroidism of renal origin: Secondary | ICD-10-CM | POA: Diagnosis not present

## 2022-03-22 DIAGNOSIS — N186 End stage renal disease: Secondary | ICD-10-CM | POA: Diagnosis not present

## 2022-03-22 DIAGNOSIS — Z992 Dependence on renal dialysis: Secondary | ICD-10-CM | POA: Diagnosis not present

## 2022-03-24 DIAGNOSIS — Z992 Dependence on renal dialysis: Secondary | ICD-10-CM | POA: Diagnosis not present

## 2022-03-24 DIAGNOSIS — N2581 Secondary hyperparathyroidism of renal origin: Secondary | ICD-10-CM | POA: Diagnosis not present

## 2022-03-24 DIAGNOSIS — N186 End stage renal disease: Secondary | ICD-10-CM | POA: Diagnosis not present

## 2022-03-25 ENCOUNTER — Encounter: Payer: Self-pay | Admitting: Podiatry

## 2022-03-25 ENCOUNTER — Ambulatory Visit (INDEPENDENT_AMBULATORY_CARE_PROVIDER_SITE_OTHER): Payer: Medicare Other | Admitting: Podiatry

## 2022-03-25 VITALS — BP 130/74

## 2022-03-25 DIAGNOSIS — B351 Tinea unguium: Secondary | ICD-10-CM

## 2022-03-25 DIAGNOSIS — M79675 Pain in left toe(s): Secondary | ICD-10-CM | POA: Diagnosis not present

## 2022-03-25 DIAGNOSIS — Q828 Other specified congenital malformations of skin: Secondary | ICD-10-CM | POA: Diagnosis not present

## 2022-03-25 DIAGNOSIS — M79674 Pain in right toe(s): Secondary | ICD-10-CM

## 2022-03-25 DIAGNOSIS — D631 Anemia in chronic kidney disease: Secondary | ICD-10-CM | POA: Diagnosis not present

## 2022-03-25 DIAGNOSIS — N186 End stage renal disease: Secondary | ICD-10-CM | POA: Diagnosis not present

## 2022-03-25 DIAGNOSIS — Z992 Dependence on renal dialysis: Secondary | ICD-10-CM

## 2022-03-25 DIAGNOSIS — L84 Corns and callosities: Secondary | ICD-10-CM

## 2022-03-25 NOTE — Progress Notes (Unsigned)
  Subjective:  Patient ID: Dwayne Reed, male    DOB: 11-05-1955,  MRN: [id number]  Dnaiel Voller presents to clinic today for {jgcomplaint:23593}  Chief Complaint  Patient presents with   Nail Problem    RFC PCP-Jones, Deanna PCP VST-02/2022   New problem(s): None. {jgcomplaint:23593}  PCP is Juline Patch, MD.  Allergies  Allergen Reactions   Sensipar [Cinacalcet]     Other reaction(s): Unknown    Review of Systems: Negative except as noted in the HPI.  Objective: No changes noted in today's physical examination. Vitals:   03/25/22 0731  BP: 130/74   Dwayne Reed is a pleasant 67 y.o. male {jgbodyhabitus:24098} AAO x 3. Vascular CFT <3 seconds b/l LE. Palpable pedal pulses b/l LE. Pedal hair absent. No pain with calf compression b/l. Lower extremity skin temperature gradient within normal limits. No edema noted b/l LE. No cyanosis or clubbing noted b/l LE.  Neurologic Protective sensation intact 5/5 intact bilaterally with 10g monofilament b/l. Vibratory sensation intact b/l.  Dermatologic Pedal skin is warm and supple b/l.  No open wounds b/l lower extremities. No interdigital macerations b/l lower extremities.   Toenails 1-5 b/l elongated, discolored, dystrophic, thickened, crumbly with subungual debris and tenderness to dorsal palpation.   Hyperkeratotic lesion(s) R 2nd toe, submet head 1 left foot, submet head 3 right foot. No erythema, no edema, no drainage, no fluctuance.  Orthopedic: Normal muscle strength 5/5 to all lower extremity muscle groups bilaterally. Patient ambulates independent of any assistive aids. HAV with bunion deformity noted b/l LE. Hammertoe deformity noted 2-5 b/l.   Assessment/Plan: No diagnosis found.  No orders of the defined types were placed in this encounter.   None {Jgplan:23602::"-Patient/POA to call should there be question/concern in the interim."}   Return in about 3 months (around 06/24/2022).  [name], DPM

## 2022-03-26 DIAGNOSIS — N2581 Secondary hyperparathyroidism of renal origin: Secondary | ICD-10-CM | POA: Diagnosis not present

## 2022-03-26 DIAGNOSIS — N186 End stage renal disease: Secondary | ICD-10-CM | POA: Diagnosis not present

## 2022-03-26 DIAGNOSIS — Z992 Dependence on renal dialysis: Secondary | ICD-10-CM | POA: Diagnosis not present

## 2022-03-29 DIAGNOSIS — N2581 Secondary hyperparathyroidism of renal origin: Secondary | ICD-10-CM | POA: Diagnosis not present

## 2022-03-29 DIAGNOSIS — Z992 Dependence on renal dialysis: Secondary | ICD-10-CM | POA: Diagnosis not present

## 2022-03-29 DIAGNOSIS — N186 End stage renal disease: Secondary | ICD-10-CM | POA: Diagnosis not present

## 2022-03-31 DIAGNOSIS — N186 End stage renal disease: Secondary | ICD-10-CM | POA: Diagnosis not present

## 2022-03-31 DIAGNOSIS — Z992 Dependence on renal dialysis: Secondary | ICD-10-CM | POA: Diagnosis not present

## 2022-03-31 DIAGNOSIS — N2581 Secondary hyperparathyroidism of renal origin: Secondary | ICD-10-CM | POA: Diagnosis not present

## 2022-04-02 DIAGNOSIS — Z992 Dependence on renal dialysis: Secondary | ICD-10-CM | POA: Diagnosis not present

## 2022-04-02 DIAGNOSIS — N186 End stage renal disease: Secondary | ICD-10-CM | POA: Diagnosis not present

## 2022-04-02 DIAGNOSIS — N2581 Secondary hyperparathyroidism of renal origin: Secondary | ICD-10-CM | POA: Diagnosis not present

## 2022-04-05 DIAGNOSIS — N2581 Secondary hyperparathyroidism of renal origin: Secondary | ICD-10-CM | POA: Diagnosis not present

## 2022-04-05 DIAGNOSIS — N186 End stage renal disease: Secondary | ICD-10-CM | POA: Diagnosis not present

## 2022-04-05 DIAGNOSIS — Z992 Dependence on renal dialysis: Secondary | ICD-10-CM | POA: Diagnosis not present

## 2022-04-07 DIAGNOSIS — N2581 Secondary hyperparathyroidism of renal origin: Secondary | ICD-10-CM | POA: Diagnosis not present

## 2022-04-07 DIAGNOSIS — Z992 Dependence on renal dialysis: Secondary | ICD-10-CM | POA: Diagnosis not present

## 2022-04-07 DIAGNOSIS — N186 End stage renal disease: Secondary | ICD-10-CM | POA: Diagnosis not present

## 2022-04-07 NOTE — Progress Notes (Unsigned)
04/08/2022 7:45 PM   Cecile Hearing [birth year] [id number]  Referring provider: Juline Patch, MD [address] [place],  [place] [address]  Urological history: 1.  Low risk prostate cancer -PSA (02/2022) 11.5 -Gleason 3+3 (single foci at the left mid base) his PSA was in the 9 range a back to 2014.  Ultimately, he underwent prostate biopsy in 2017 at which time his PSA was 9.4.  Prostate volume 66 g at that time.  2. Renal cell carcinoma -cryoablation of the left-sided tumor and 2016.  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 -RUS, 04/2019 - Severe diffuse bilateral cortical thinning and increased echotexture compatible with chronic medical renal disease.  No suspicious focal renal mass.  3. Nephrolithiasis -Stone composition of calcium oxalate -Right URS 2021 -KUB, 07/2021 Small nonobstructing stones are identified in both kidneys.  In the left inferior pelvis, there is a 2 mm calcific density, stone within the distal left ureter is not excluded.  3 mm calcific density projected in the right sacrum, stone within the distal right ureter is not excluded.  4. ESRD -on dialysis -oliguric     HPI: Dwayne Reed is a 67 y.o. male who presents today for evaluation of his prostate issues.     PMH: Past Medical History:  Diagnosis Date   Anemia    low iron   CHF (congestive heart failure) (HCC)    pt denies   COPD (chronic obstructive pulmonary disease) ([place])    Dialysis patient ([place])    History of kidney stones    Hypertension    Kidney stones    Pneumonia    Renal disorder    ESRD on HD (M,W,F)    Surgical History: Past Surgical History:  Procedure Laterality Date   CYSTOSCOPY/URETEROSCOPY/HOLMIUM LASER/STENT PLACEMENT N/A 02/25/2020   Procedure: CYSTOSCOPY/URETEROSCOPY/HOLMIUM LASER/STENT PLACEMENT;  Surgeon: Abbie Sons, MD;  Location: [facility] ORS;  Service: Urology;  Laterality: N/A;   CYSTOSCOPY/URETEROSCOPY/HOLMIUM  LASER/STENT PLACEMENT Right 03/09/2020   Procedure: CYSTOSCOPY/URETEROSCOPY/HOLMIUM LASER/STENT Exchange;  Surgeon: Abbie Sons, MD;  Location: [facility] ORS;  Service: Urology;  Laterality: Right;   EXTRACORPOREAL SHOCK WAVE LITHOTRIPSY     LITHOTRIPSY     x 5   PROSTATE BIOPSY      Home Medications:  Allergies as of 04/08/2022       Reactions   Sensipar [cinacalcet]    Other reaction(s): Unknown        Medication List        Accurate as of April 07, 2022  7:45 PM. If you have any questions, ask your nurse or doctor.          Auryxia 1 GM 210 MG(Fe) tablet Generic drug: ferric citrate Take 3 tablets (630 mg total) by mouth 3 (three) times daily.   carvedilol 12.5 MG tablet Commonly known as: COREG Take 1 tablet (12.5 mg total) by mouth 2 (two) times daily with a meal.   nicotine 7 mg/24hr patch Commonly known as: NICODERM CQ - dosed in mg/24 hr Place 1 patch (7 mg total) onto the skin daily.   tamsulosin 0.4 MG Caps capsule Commonly known as: FLOMAX Take 1 capsule (0.4 mg total) by mouth daily.   ZINC PO Take by mouth.        Allergies:  Allergies  Allergen Reactions   Sensipar [Cinacalcet]     Other reaction(s): Unknown    Family History: Family History  Problem Relation Age of Onset   Hypertension Mother  Heart disease Mother    Sarcoidosis Sister    Cancer Maternal Grandmother        type unknown    Social History:  reports that he has been smoking cigarettes. He has a 15.00 pack-year smoking history. He has never used smokeless tobacco. He reports current alcohol use. He reports that he does not use drugs.  ROS: Pertinent ROS in HPI  Physical Exam: There were no vitals taken for this visit.  Constitutional:  Well nourished. Alert and oriented, No acute distress. HEENT: [place] AT, moist mucus membranes.  Trachea midline Cardiovascular: No clubbing, cyanosis, or edema. Respiratory: Normal respiratory effort, no increased work of  breathing. GU: No CVA tenderness.  No bladder fullness or masses.  Patient with circumcised/uncircumcised phallus. ***Foreskin easily retracted***  Urethral meatus is patent.  No penile discharge. No penile lesions or rashes. Scrotum without lesions, cysts, rashes and/or edema.  Testicles are located scrotally bilaterally. No masses are appreciated in the testicles. Left and right epididymis are normal. Rectal: Patient with  normal sphincter tone. Anus and perineum without scarring or rashes. No rectal masses are appreciated. Prostate is approximately *** grams, *** nodules are appreciated. Seminal vesicles are normal. Neurologic: Grossly intact, no focal deficits, moving all 4 extremities. Psychiatric: Normal mood and affect.   Laboratory Data: Lab Results  Component Value Date   WBC 4.3 03/09/2022   HGB 12.2 (L) 03/09/2022   HCT 36.6 (L) 03/09/2022   MCV 98 (H) 03/09/2022   PLT 130 (L) 03/09/2022    Lab Results  Component Value Date   CREATININE 10.55 (H) 03/09/2022      Component Value Date/Time   CHOL 195 03/09/2022 0942   HDL 47 03/09/2022 0942   CHOLHDL 4.7 09/27/2019 1627   VLDL 40.6 (H) 03/27/2019 1517   LDLCALC 129 (H) 03/09/2022 0942   LDLCALC 112 (H) 09/27/2019 1627    Lab Results  Component Value Date   AST 10 03/09/2022   Lab Results  Component Value Date   ALT 14 03/09/2022  I have reviewed the labs.   Pertinent Imaging: N/A    Assessment & Plan:    1. Oliguria  - Bladder Scan (Post Void Residual) in office - PVR nil  2. Prostate cancer -messaged Dr. Ronnald Ramp to add PSA to the blood work done earlier this afternoon  3. Nephrolithiasis -episode of urgency on Sunday may be secondary to an ureteral stone  -KUB bilateral nephrolithiasis with possible bilateral ureteral stones -CT renal stone pending    No follow-ups on file.  These notes generated with voice recognition software. I apologize for typographical errors.  [place],  [place] [address]  [place], [place] [address] [phone]

## 2022-04-08 ENCOUNTER — Ambulatory Visit (INDEPENDENT_AMBULATORY_CARE_PROVIDER_SITE_OTHER): Payer: Medicare Other | Admitting: Urology

## 2022-04-08 ENCOUNTER — Encounter: Payer: Self-pay | Admitting: Urology

## 2022-04-08 VITALS — BP 121/65 | HR 91 | Ht 72.0 in | Wt 174.1 lb

## 2022-04-08 DIAGNOSIS — C61 Malignant neoplasm of prostate: Secondary | ICD-10-CM

## 2022-04-08 DIAGNOSIS — N200 Calculus of kidney: Secondary | ICD-10-CM | POA: Diagnosis not present

## 2022-04-08 NOTE — Patient Instructions (Signed)
Prostate MRI Prep:  1- No ejaculation 48 hours prior to exam  2- No caffeine or carbonated beverages on day of the exam  3- Eat light diet evening prior and day of exam  4- Avoid eating 4 hours prior to exam  5- Fleets enema needs to be done 4 hours prior to exam -See below. Can be purchased at the drug store.      

## 2022-04-09 DIAGNOSIS — N186 End stage renal disease: Secondary | ICD-10-CM | POA: Diagnosis not present

## 2022-04-09 DIAGNOSIS — N2581 Secondary hyperparathyroidism of renal origin: Secondary | ICD-10-CM | POA: Diagnosis not present

## 2022-04-09 DIAGNOSIS — Z992 Dependence on renal dialysis: Secondary | ICD-10-CM | POA: Diagnosis not present

## 2022-04-12 ENCOUNTER — Telehealth: Payer: Self-pay | Admitting: Family Medicine

## 2022-04-12 DIAGNOSIS — N2581 Secondary hyperparathyroidism of renal origin: Secondary | ICD-10-CM | POA: Diagnosis not present

## 2022-04-12 DIAGNOSIS — N186 End stage renal disease: Secondary | ICD-10-CM | POA: Diagnosis not present

## 2022-04-12 DIAGNOSIS — Z992 Dependence on renal dialysis: Secondary | ICD-10-CM | POA: Diagnosis not present

## 2022-04-12 NOTE — Telephone Encounter (Signed)
Copied from [place] [id number]. Topic: Medicare AWV >> Apr 12, 2022  9:54 AM Devoria Glassing wrote: Reason for CRM: Left message tor patient to schedule Medicare Annual Wellness Visit (AWV) with [place], [place]  Appointment can be an offiice/telephone or virtual visit;  Please call [phone] ask for Juliann Pulse.

## 2022-04-14 DIAGNOSIS — I129 Hypertensive chronic kidney disease with stage 1 through stage 4 chronic kidney disease, or unspecified chronic kidney disease: Secondary | ICD-10-CM | POA: Diagnosis not present

## 2022-04-14 DIAGNOSIS — Z992 Dependence on renal dialysis: Secondary | ICD-10-CM | POA: Diagnosis not present

## 2022-04-14 DIAGNOSIS — N186 End stage renal disease: Secondary | ICD-10-CM | POA: Diagnosis not present

## 2022-04-14 DIAGNOSIS — N2581 Secondary hyperparathyroidism of renal origin: Secondary | ICD-10-CM | POA: Diagnosis not present

## 2022-04-16 DIAGNOSIS — N2581 Secondary hyperparathyroidism of renal origin: Secondary | ICD-10-CM | POA: Diagnosis not present

## 2022-04-16 DIAGNOSIS — Z992 Dependence on renal dialysis: Secondary | ICD-10-CM | POA: Diagnosis not present

## 2022-04-16 DIAGNOSIS — N186 End stage renal disease: Secondary | ICD-10-CM | POA: Diagnosis not present

## 2022-04-19 DIAGNOSIS — N2581 Secondary hyperparathyroidism of renal origin: Secondary | ICD-10-CM | POA: Diagnosis not present

## 2022-04-19 DIAGNOSIS — Z992 Dependence on renal dialysis: Secondary | ICD-10-CM | POA: Diagnosis not present

## 2022-04-19 DIAGNOSIS — N186 End stage renal disease: Secondary | ICD-10-CM | POA: Diagnosis not present

## 2022-04-21 DIAGNOSIS — Z992 Dependence on renal dialysis: Secondary | ICD-10-CM | POA: Diagnosis not present

## 2022-04-21 DIAGNOSIS — N2581 Secondary hyperparathyroidism of renal origin: Secondary | ICD-10-CM | POA: Diagnosis not present

## 2022-04-21 DIAGNOSIS — N186 End stage renal disease: Secondary | ICD-10-CM | POA: Diagnosis not present

## 2022-04-23 DIAGNOSIS — N2581 Secondary hyperparathyroidism of renal origin: Secondary | ICD-10-CM | POA: Diagnosis not present

## 2022-04-23 DIAGNOSIS — N186 End stage renal disease: Secondary | ICD-10-CM | POA: Diagnosis not present

## 2022-04-23 DIAGNOSIS — Z992 Dependence on renal dialysis: Secondary | ICD-10-CM | POA: Diagnosis not present

## 2022-04-26 DIAGNOSIS — N186 End stage renal disease: Secondary | ICD-10-CM | POA: Diagnosis not present

## 2022-04-26 DIAGNOSIS — Z992 Dependence on renal dialysis: Secondary | ICD-10-CM | POA: Diagnosis not present

## 2022-04-26 DIAGNOSIS — N2581 Secondary hyperparathyroidism of renal origin: Secondary | ICD-10-CM | POA: Diagnosis not present

## 2022-04-28 DIAGNOSIS — N2581 Secondary hyperparathyroidism of renal origin: Secondary | ICD-10-CM | POA: Diagnosis not present

## 2022-04-28 DIAGNOSIS — Z992 Dependence on renal dialysis: Secondary | ICD-10-CM | POA: Diagnosis not present

## 2022-04-28 DIAGNOSIS — N186 End stage renal disease: Secondary | ICD-10-CM | POA: Diagnosis not present

## 2022-04-30 DIAGNOSIS — N186 End stage renal disease: Secondary | ICD-10-CM | POA: Diagnosis not present

## 2022-04-30 DIAGNOSIS — N2581 Secondary hyperparathyroidism of renal origin: Secondary | ICD-10-CM | POA: Diagnosis not present

## 2022-04-30 DIAGNOSIS — Z992 Dependence on renal dialysis: Secondary | ICD-10-CM | POA: Diagnosis not present

## 2022-05-03 DIAGNOSIS — Z992 Dependence on renal dialysis: Secondary | ICD-10-CM | POA: Diagnosis not present

## 2022-05-03 DIAGNOSIS — N2581 Secondary hyperparathyroidism of renal origin: Secondary | ICD-10-CM | POA: Diagnosis not present

## 2022-05-03 DIAGNOSIS — N186 End stage renal disease: Secondary | ICD-10-CM | POA: Diagnosis not present

## 2022-05-05 ENCOUNTER — Ambulatory Visit: Payer: Medicare Other

## 2022-05-05 ENCOUNTER — Ambulatory Visit: Payer: Medicare Other | Admitting: Urology

## 2022-05-05 DIAGNOSIS — N2581 Secondary hyperparathyroidism of renal origin: Secondary | ICD-10-CM | POA: Diagnosis not present

## 2022-05-05 DIAGNOSIS — Z992 Dependence on renal dialysis: Secondary | ICD-10-CM | POA: Diagnosis not present

## 2022-05-05 DIAGNOSIS — N186 End stage renal disease: Secondary | ICD-10-CM | POA: Diagnosis not present

## 2022-05-07 DIAGNOSIS — N186 End stage renal disease: Secondary | ICD-10-CM | POA: Diagnosis not present

## 2022-05-07 DIAGNOSIS — Z992 Dependence on renal dialysis: Secondary | ICD-10-CM | POA: Diagnosis not present

## 2022-05-07 DIAGNOSIS — N2581 Secondary hyperparathyroidism of renal origin: Secondary | ICD-10-CM | POA: Diagnosis not present

## 2022-05-10 DIAGNOSIS — Z992 Dependence on renal dialysis: Secondary | ICD-10-CM | POA: Diagnosis not present

## 2022-05-10 DIAGNOSIS — N2581 Secondary hyperparathyroidism of renal origin: Secondary | ICD-10-CM | POA: Diagnosis not present

## 2022-05-10 DIAGNOSIS — N186 End stage renal disease: Secondary | ICD-10-CM | POA: Diagnosis not present

## 2022-05-12 DIAGNOSIS — N2581 Secondary hyperparathyroidism of renal origin: Secondary | ICD-10-CM | POA: Diagnosis not present

## 2022-05-12 DIAGNOSIS — Z992 Dependence on renal dialysis: Secondary | ICD-10-CM | POA: Diagnosis not present

## 2022-05-12 DIAGNOSIS — N186 End stage renal disease: Secondary | ICD-10-CM | POA: Diagnosis not present

## 2022-05-13 DIAGNOSIS — N186 End stage renal disease: Secondary | ICD-10-CM | POA: Diagnosis not present

## 2022-05-13 DIAGNOSIS — I129 Hypertensive chronic kidney disease with stage 1 through stage 4 chronic kidney disease, or unspecified chronic kidney disease: Secondary | ICD-10-CM | POA: Diagnosis not present

## 2022-05-13 DIAGNOSIS — Z992 Dependence on renal dialysis: Secondary | ICD-10-CM | POA: Diagnosis not present

## 2022-05-14 DIAGNOSIS — N2581 Secondary hyperparathyroidism of renal origin: Secondary | ICD-10-CM | POA: Diagnosis not present

## 2022-05-14 DIAGNOSIS — N186 End stage renal disease: Secondary | ICD-10-CM | POA: Diagnosis not present

## 2022-05-14 DIAGNOSIS — Z992 Dependence on renal dialysis: Secondary | ICD-10-CM | POA: Diagnosis not present

## 2022-05-15 ENCOUNTER — Encounter: Payer: Self-pay | Admitting: Urology

## 2022-05-17 DIAGNOSIS — N186 End stage renal disease: Secondary | ICD-10-CM | POA: Diagnosis not present

## 2022-05-17 DIAGNOSIS — Z992 Dependence on renal dialysis: Secondary | ICD-10-CM | POA: Diagnosis not present

## 2022-05-17 DIAGNOSIS — N2581 Secondary hyperparathyroidism of renal origin: Secondary | ICD-10-CM | POA: Diagnosis not present

## 2022-05-19 DIAGNOSIS — Z992 Dependence on renal dialysis: Secondary | ICD-10-CM | POA: Diagnosis not present

## 2022-05-19 DIAGNOSIS — N186 End stage renal disease: Secondary | ICD-10-CM | POA: Diagnosis not present

## 2022-05-19 DIAGNOSIS — N2581 Secondary hyperparathyroidism of renal origin: Secondary | ICD-10-CM | POA: Diagnosis not present

## 2022-05-21 DIAGNOSIS — Z992 Dependence on renal dialysis: Secondary | ICD-10-CM | POA: Diagnosis not present

## 2022-05-21 DIAGNOSIS — N2581 Secondary hyperparathyroidism of renal origin: Secondary | ICD-10-CM | POA: Diagnosis not present

## 2022-05-21 DIAGNOSIS — N186 End stage renal disease: Secondary | ICD-10-CM | POA: Diagnosis not present

## 2022-05-24 DIAGNOSIS — Z992 Dependence on renal dialysis: Secondary | ICD-10-CM | POA: Diagnosis not present

## 2022-05-24 DIAGNOSIS — N2581 Secondary hyperparathyroidism of renal origin: Secondary | ICD-10-CM | POA: Diagnosis not present

## 2022-05-24 DIAGNOSIS — N186 End stage renal disease: Secondary | ICD-10-CM | POA: Diagnosis not present

## 2022-05-26 DIAGNOSIS — N186 End stage renal disease: Secondary | ICD-10-CM | POA: Diagnosis not present

## 2022-05-26 DIAGNOSIS — Z992 Dependence on renal dialysis: Secondary | ICD-10-CM | POA: Diagnosis not present

## 2022-05-26 DIAGNOSIS — N2581 Secondary hyperparathyroidism of renal origin: Secondary | ICD-10-CM | POA: Diagnosis not present

## 2022-05-28 DIAGNOSIS — N186 End stage renal disease: Secondary | ICD-10-CM | POA: Diagnosis not present

## 2022-05-28 DIAGNOSIS — N2581 Secondary hyperparathyroidism of renal origin: Secondary | ICD-10-CM | POA: Diagnosis not present

## 2022-05-28 DIAGNOSIS — Z992 Dependence on renal dialysis: Secondary | ICD-10-CM | POA: Diagnosis not present

## 2022-05-31 DIAGNOSIS — N186 End stage renal disease: Secondary | ICD-10-CM | POA: Diagnosis not present

## 2022-05-31 DIAGNOSIS — Z992 Dependence on renal dialysis: Secondary | ICD-10-CM | POA: Diagnosis not present

## 2022-05-31 DIAGNOSIS — N2581 Secondary hyperparathyroidism of renal origin: Secondary | ICD-10-CM | POA: Diagnosis not present

## 2022-06-02 DIAGNOSIS — N2581 Secondary hyperparathyroidism of renal origin: Secondary | ICD-10-CM | POA: Diagnosis not present

## 2022-06-02 DIAGNOSIS — Z992 Dependence on renal dialysis: Secondary | ICD-10-CM | POA: Diagnosis not present

## 2022-06-02 DIAGNOSIS — N186 End stage renal disease: Secondary | ICD-10-CM | POA: Diagnosis not present

## 2022-06-04 DIAGNOSIS — N2581 Secondary hyperparathyroidism of renal origin: Secondary | ICD-10-CM | POA: Diagnosis not present

## 2022-06-04 DIAGNOSIS — N186 End stage renal disease: Secondary | ICD-10-CM | POA: Diagnosis not present

## 2022-06-04 DIAGNOSIS — Z992 Dependence on renal dialysis: Secondary | ICD-10-CM | POA: Diagnosis not present

## 2022-06-07 DIAGNOSIS — N2581 Secondary hyperparathyroidism of renal origin: Secondary | ICD-10-CM | POA: Diagnosis not present

## 2022-06-07 DIAGNOSIS — N186 End stage renal disease: Secondary | ICD-10-CM | POA: Diagnosis not present

## 2022-06-07 DIAGNOSIS — Z992 Dependence on renal dialysis: Secondary | ICD-10-CM | POA: Diagnosis not present

## 2022-06-09 DIAGNOSIS — N186 End stage renal disease: Secondary | ICD-10-CM | POA: Diagnosis not present

## 2022-06-09 DIAGNOSIS — N2581 Secondary hyperparathyroidism of renal origin: Secondary | ICD-10-CM | POA: Diagnosis not present

## 2022-06-09 DIAGNOSIS — Z992 Dependence on renal dialysis: Secondary | ICD-10-CM | POA: Diagnosis not present

## 2022-06-11 DIAGNOSIS — N2581 Secondary hyperparathyroidism of renal origin: Secondary | ICD-10-CM | POA: Diagnosis not present

## 2022-06-11 DIAGNOSIS — N186 End stage renal disease: Secondary | ICD-10-CM | POA: Diagnosis not present

## 2022-06-11 DIAGNOSIS — Z992 Dependence on renal dialysis: Secondary | ICD-10-CM | POA: Diagnosis not present

## 2022-06-13 DIAGNOSIS — Z992 Dependence on renal dialysis: Secondary | ICD-10-CM | POA: Diagnosis not present

## 2022-06-13 DIAGNOSIS — N186 End stage renal disease: Secondary | ICD-10-CM | POA: Diagnosis not present

## 2022-06-13 DIAGNOSIS — I129 Hypertensive chronic kidney disease with stage 1 through stage 4 chronic kidney disease, or unspecified chronic kidney disease: Secondary | ICD-10-CM | POA: Diagnosis not present

## 2022-06-14 DIAGNOSIS — D509 Iron deficiency anemia, unspecified: Secondary | ICD-10-CM | POA: Diagnosis not present

## 2022-06-14 DIAGNOSIS — Z992 Dependence on renal dialysis: Secondary | ICD-10-CM | POA: Diagnosis not present

## 2022-06-14 DIAGNOSIS — N2581 Secondary hyperparathyroidism of renal origin: Secondary | ICD-10-CM | POA: Diagnosis not present

## 2022-06-14 DIAGNOSIS — N186 End stage renal disease: Secondary | ICD-10-CM | POA: Diagnosis not present

## 2022-06-14 DIAGNOSIS — D631 Anemia in chronic kidney disease: Secondary | ICD-10-CM | POA: Diagnosis not present

## 2022-06-16 DIAGNOSIS — N186 End stage renal disease: Secondary | ICD-10-CM | POA: Diagnosis not present

## 2022-06-16 DIAGNOSIS — N2581 Secondary hyperparathyroidism of renal origin: Secondary | ICD-10-CM | POA: Diagnosis not present

## 2022-06-16 DIAGNOSIS — D509 Iron deficiency anemia, unspecified: Secondary | ICD-10-CM | POA: Diagnosis not present

## 2022-06-16 DIAGNOSIS — Z992 Dependence on renal dialysis: Secondary | ICD-10-CM | POA: Diagnosis not present

## 2022-06-16 DIAGNOSIS — D631 Anemia in chronic kidney disease: Secondary | ICD-10-CM | POA: Diagnosis not present

## 2022-06-18 DIAGNOSIS — Z992 Dependence on renal dialysis: Secondary | ICD-10-CM | POA: Diagnosis not present

## 2022-06-18 DIAGNOSIS — D509 Iron deficiency anemia, unspecified: Secondary | ICD-10-CM | POA: Diagnosis not present

## 2022-06-18 DIAGNOSIS — N186 End stage renal disease: Secondary | ICD-10-CM | POA: Diagnosis not present

## 2022-06-18 DIAGNOSIS — D631 Anemia in chronic kidney disease: Secondary | ICD-10-CM | POA: Diagnosis not present

## 2022-06-18 DIAGNOSIS — N2581 Secondary hyperparathyroidism of renal origin: Secondary | ICD-10-CM | POA: Diagnosis not present

## 2022-06-21 DIAGNOSIS — D509 Iron deficiency anemia, unspecified: Secondary | ICD-10-CM | POA: Diagnosis not present

## 2022-06-21 DIAGNOSIS — N186 End stage renal disease: Secondary | ICD-10-CM | POA: Diagnosis not present

## 2022-06-21 DIAGNOSIS — N2581 Secondary hyperparathyroidism of renal origin: Secondary | ICD-10-CM | POA: Diagnosis not present

## 2022-06-21 DIAGNOSIS — Z992 Dependence on renal dialysis: Secondary | ICD-10-CM | POA: Diagnosis not present

## 2022-06-21 DIAGNOSIS — D631 Anemia in chronic kidney disease: Secondary | ICD-10-CM | POA: Diagnosis not present

## 2022-06-23 DIAGNOSIS — N2581 Secondary hyperparathyroidism of renal origin: Secondary | ICD-10-CM | POA: Diagnosis not present

## 2022-06-23 DIAGNOSIS — N186 End stage renal disease: Secondary | ICD-10-CM | POA: Diagnosis not present

## 2022-06-23 DIAGNOSIS — D631 Anemia in chronic kidney disease: Secondary | ICD-10-CM | POA: Diagnosis not present

## 2022-06-23 DIAGNOSIS — Z992 Dependence on renal dialysis: Secondary | ICD-10-CM | POA: Diagnosis not present

## 2022-06-23 DIAGNOSIS — D509 Iron deficiency anemia, unspecified: Secondary | ICD-10-CM | POA: Diagnosis not present

## 2022-06-25 DIAGNOSIS — N2581 Secondary hyperparathyroidism of renal origin: Secondary | ICD-10-CM | POA: Diagnosis not present

## 2022-06-25 DIAGNOSIS — N186 End stage renal disease: Secondary | ICD-10-CM | POA: Diagnosis not present

## 2022-06-25 DIAGNOSIS — D509 Iron deficiency anemia, unspecified: Secondary | ICD-10-CM | POA: Diagnosis not present

## 2022-06-25 DIAGNOSIS — D631 Anemia in chronic kidney disease: Secondary | ICD-10-CM | POA: Diagnosis not present

## 2022-06-25 DIAGNOSIS — Z992 Dependence on renal dialysis: Secondary | ICD-10-CM | POA: Diagnosis not present

## 2022-06-28 DIAGNOSIS — D509 Iron deficiency anemia, unspecified: Secondary | ICD-10-CM | POA: Diagnosis not present

## 2022-06-28 DIAGNOSIS — D631 Anemia in chronic kidney disease: Secondary | ICD-10-CM | POA: Diagnosis not present

## 2022-06-28 DIAGNOSIS — N186 End stage renal disease: Secondary | ICD-10-CM | POA: Diagnosis not present

## 2022-06-28 DIAGNOSIS — N2581 Secondary hyperparathyroidism of renal origin: Secondary | ICD-10-CM | POA: Diagnosis not present

## 2022-06-28 DIAGNOSIS — Z992 Dependence on renal dialysis: Secondary | ICD-10-CM | POA: Diagnosis not present

## 2022-06-30 DIAGNOSIS — D631 Anemia in chronic kidney disease: Secondary | ICD-10-CM | POA: Diagnosis not present

## 2022-06-30 DIAGNOSIS — N186 End stage renal disease: Secondary | ICD-10-CM | POA: Diagnosis not present

## 2022-06-30 DIAGNOSIS — D509 Iron deficiency anemia, unspecified: Secondary | ICD-10-CM | POA: Diagnosis not present

## 2022-06-30 DIAGNOSIS — N2581 Secondary hyperparathyroidism of renal origin: Secondary | ICD-10-CM | POA: Diagnosis not present

## 2022-06-30 DIAGNOSIS — Z992 Dependence on renal dialysis: Secondary | ICD-10-CM | POA: Diagnosis not present

## 2022-07-01 ENCOUNTER — Encounter: Payer: Self-pay | Admitting: Podiatry

## 2022-07-01 ENCOUNTER — Ambulatory Visit (INDEPENDENT_AMBULATORY_CARE_PROVIDER_SITE_OTHER): Payer: Medicare Other | Admitting: Podiatry

## 2022-07-01 VITALS — BP 108/67

## 2022-07-01 DIAGNOSIS — M79674 Pain in right toe(s): Secondary | ICD-10-CM

## 2022-07-01 DIAGNOSIS — Z992 Dependence on renal dialysis: Secondary | ICD-10-CM

## 2022-07-01 DIAGNOSIS — M79675 Pain in left toe(s): Secondary | ICD-10-CM

## 2022-07-01 DIAGNOSIS — N186 End stage renal disease: Secondary | ICD-10-CM

## 2022-07-01 DIAGNOSIS — L84 Corns and callosities: Secondary | ICD-10-CM | POA: Diagnosis not present

## 2022-07-01 DIAGNOSIS — D631 Anemia in chronic kidney disease: Secondary | ICD-10-CM

## 2022-07-01 DIAGNOSIS — Q828 Other specified congenital malformations of skin: Secondary | ICD-10-CM | POA: Diagnosis not present

## 2022-07-01 DIAGNOSIS — B351 Tinea unguium: Secondary | ICD-10-CM | POA: Diagnosis not present

## 2022-07-01 DIAGNOSIS — D689 Coagulation defect, unspecified: Secondary | ICD-10-CM

## 2022-07-01 NOTE — Progress Notes (Signed)
  Subjective:  Patient ID: Dwayne Reed, male    DOB: 1955-07-18,  MRN: [id number]  Dwayne Reed presents to clinic today for {jgcomplaint:23593}  Chief Complaint  Patient presents with   Nail Problem    RFC PCP-Jones,Deanna PCP VST-2023   New problem(s): None. {jgcomplaint:23593}  PCP is Duanne Limerick, MD.  Allergies  Allergen Reactions   Cinacalcet Other (See Comments)    Other reaction(s): Unknown    Review of Systems: Negative except as noted in the HPI.  Objective: No changes noted in today's physical examination. Vitals:   07/01/22 0751  BP: 108/67   Dwayne Reed is a pleasant 67 y.o. male {jgbodyhabitus:24098} AAO x 3.  Vascular CFT <3 seconds b/l LE. Palpable pedal pulses b/l LE. Pedal hair absent. No pain with calf compression b/l. Lower extremity skin temperature gradient within normal limits. No edema noted b/l LE. No cyanosis or clubbing noted b/l LE.  Neurologic Protective sensation intact 5/5 intact bilaterally with 10g monofilament b/l. Vibratory sensation intact b/l.  Dermatologic Pedal skin is warm and supple b/l.  No open wounds b/l lower extremities. No interdigital macerations b/l lower extremities.   Toenails 1-5 b/l elongated, discolored, dystrophic, thickened, crumbly with subungual debris and tenderness to dorsal palpation.   Hyperkeratotic lesion(s) R 2nd toe, submet head 1 left foot, submet head  2 b/l. No erythema, no edema, no drainage, no fluctuance.  Orthopedic: Normal muscle strength 5/5 to all lower extremity muscle groups bilaterally. Patient ambulates independent of any assistive aids. HAV with bunion deformity noted b/l LE. Hammertoe deformity noted 2-5 b/l.   Assessment/Plan: 1. Pain due to onychomycosis of toenails of both feet   2. Porokeratosis   3. Callus   4. ESRD on dialysis     No orders of the defined types were placed in this encounter.   None {Jgplan:23602::"-Patient/POA to call should there be question/concern in the  interim."}   Return in about 3 months (around 09/30/2022).  Freddie Breech, DPM

## 2022-07-02 DIAGNOSIS — D509 Iron deficiency anemia, unspecified: Secondary | ICD-10-CM | POA: Diagnosis not present

## 2022-07-02 DIAGNOSIS — N186 End stage renal disease: Secondary | ICD-10-CM | POA: Diagnosis not present

## 2022-07-02 DIAGNOSIS — N2581 Secondary hyperparathyroidism of renal origin: Secondary | ICD-10-CM | POA: Diagnosis not present

## 2022-07-02 DIAGNOSIS — D631 Anemia in chronic kidney disease: Secondary | ICD-10-CM | POA: Diagnosis not present

## 2022-07-02 DIAGNOSIS — Z992 Dependence on renal dialysis: Secondary | ICD-10-CM | POA: Diagnosis not present

## 2022-07-05 DIAGNOSIS — N186 End stage renal disease: Secondary | ICD-10-CM | POA: Diagnosis not present

## 2022-07-05 DIAGNOSIS — Z992 Dependence on renal dialysis: Secondary | ICD-10-CM | POA: Diagnosis not present

## 2022-07-05 DIAGNOSIS — N2581 Secondary hyperparathyroidism of renal origin: Secondary | ICD-10-CM | POA: Diagnosis not present

## 2022-07-05 DIAGNOSIS — D631 Anemia in chronic kidney disease: Secondary | ICD-10-CM | POA: Diagnosis not present

## 2022-07-05 DIAGNOSIS — D509 Iron deficiency anemia, unspecified: Secondary | ICD-10-CM | POA: Diagnosis not present

## 2022-07-07 DIAGNOSIS — D509 Iron deficiency anemia, unspecified: Secondary | ICD-10-CM | POA: Diagnosis not present

## 2022-07-07 DIAGNOSIS — D631 Anemia in chronic kidney disease: Secondary | ICD-10-CM | POA: Diagnosis not present

## 2022-07-07 DIAGNOSIS — N186 End stage renal disease: Secondary | ICD-10-CM | POA: Diagnosis not present

## 2022-07-07 DIAGNOSIS — N2581 Secondary hyperparathyroidism of renal origin: Secondary | ICD-10-CM | POA: Diagnosis not present

## 2022-07-07 DIAGNOSIS — Z992 Dependence on renal dialysis: Secondary | ICD-10-CM | POA: Diagnosis not present

## 2022-07-09 DIAGNOSIS — Z992 Dependence on renal dialysis: Secondary | ICD-10-CM | POA: Diagnosis not present

## 2022-07-09 DIAGNOSIS — D509 Iron deficiency anemia, unspecified: Secondary | ICD-10-CM | POA: Diagnosis not present

## 2022-07-09 DIAGNOSIS — N186 End stage renal disease: Secondary | ICD-10-CM | POA: Diagnosis not present

## 2022-07-09 DIAGNOSIS — D631 Anemia in chronic kidney disease: Secondary | ICD-10-CM | POA: Diagnosis not present

## 2022-07-09 DIAGNOSIS — N2581 Secondary hyperparathyroidism of renal origin: Secondary | ICD-10-CM | POA: Diagnosis not present

## 2022-07-12 DIAGNOSIS — N186 End stage renal disease: Secondary | ICD-10-CM | POA: Diagnosis not present

## 2022-07-12 DIAGNOSIS — D631 Anemia in chronic kidney disease: Secondary | ICD-10-CM | POA: Diagnosis not present

## 2022-07-12 DIAGNOSIS — D509 Iron deficiency anemia, unspecified: Secondary | ICD-10-CM | POA: Diagnosis not present

## 2022-07-12 DIAGNOSIS — Z992 Dependence on renal dialysis: Secondary | ICD-10-CM | POA: Diagnosis not present

## 2022-07-12 DIAGNOSIS — N2581 Secondary hyperparathyroidism of renal origin: Secondary | ICD-10-CM | POA: Diagnosis not present

## 2022-07-13 DIAGNOSIS — I129 Hypertensive chronic kidney disease with stage 1 through stage 4 chronic kidney disease, or unspecified chronic kidney disease: Secondary | ICD-10-CM | POA: Diagnosis not present

## 2022-07-13 DIAGNOSIS — N186 End stage renal disease: Secondary | ICD-10-CM | POA: Diagnosis not present

## 2022-07-13 DIAGNOSIS — Z992 Dependence on renal dialysis: Secondary | ICD-10-CM | POA: Diagnosis not present

## 2022-07-14 DIAGNOSIS — N2581 Secondary hyperparathyroidism of renal origin: Secondary | ICD-10-CM | POA: Diagnosis not present

## 2022-07-14 DIAGNOSIS — N186 End stage renal disease: Secondary | ICD-10-CM | POA: Diagnosis not present

## 2022-07-14 DIAGNOSIS — Z992 Dependence on renal dialysis: Secondary | ICD-10-CM | POA: Diagnosis not present

## 2022-07-16 DIAGNOSIS — N2581 Secondary hyperparathyroidism of renal origin: Secondary | ICD-10-CM | POA: Diagnosis not present

## 2022-07-16 DIAGNOSIS — Z992 Dependence on renal dialysis: Secondary | ICD-10-CM | POA: Diagnosis not present

## 2022-07-16 DIAGNOSIS — N186 End stage renal disease: Secondary | ICD-10-CM | POA: Diagnosis not present

## 2022-07-19 DIAGNOSIS — Z992 Dependence on renal dialysis: Secondary | ICD-10-CM | POA: Diagnosis not present

## 2022-07-19 DIAGNOSIS — N186 End stage renal disease: Secondary | ICD-10-CM | POA: Diagnosis not present

## 2022-07-19 DIAGNOSIS — N2581 Secondary hyperparathyroidism of renal origin: Secondary | ICD-10-CM | POA: Diagnosis not present

## 2022-07-21 DIAGNOSIS — N186 End stage renal disease: Secondary | ICD-10-CM | POA: Diagnosis not present

## 2022-07-21 DIAGNOSIS — Z992 Dependence on renal dialysis: Secondary | ICD-10-CM | POA: Diagnosis not present

## 2022-07-21 DIAGNOSIS — N2581 Secondary hyperparathyroidism of renal origin: Secondary | ICD-10-CM | POA: Diagnosis not present

## 2022-07-23 DIAGNOSIS — Z992 Dependence on renal dialysis: Secondary | ICD-10-CM | POA: Diagnosis not present

## 2022-07-23 DIAGNOSIS — N186 End stage renal disease: Secondary | ICD-10-CM | POA: Diagnosis not present

## 2022-07-23 DIAGNOSIS — N2581 Secondary hyperparathyroidism of renal origin: Secondary | ICD-10-CM | POA: Diagnosis not present

## 2022-07-26 DIAGNOSIS — N2581 Secondary hyperparathyroidism of renal origin: Secondary | ICD-10-CM | POA: Diagnosis not present

## 2022-07-26 DIAGNOSIS — N186 End stage renal disease: Secondary | ICD-10-CM | POA: Diagnosis not present

## 2022-07-26 DIAGNOSIS — Z992 Dependence on renal dialysis: Secondary | ICD-10-CM | POA: Diagnosis not present

## 2022-07-28 DIAGNOSIS — N186 End stage renal disease: Secondary | ICD-10-CM | POA: Diagnosis not present

## 2022-07-28 DIAGNOSIS — Z992 Dependence on renal dialysis: Secondary | ICD-10-CM | POA: Diagnosis not present

## 2022-07-28 DIAGNOSIS — N2581 Secondary hyperparathyroidism of renal origin: Secondary | ICD-10-CM | POA: Diagnosis not present

## 2022-07-30 DIAGNOSIS — Z992 Dependence on renal dialysis: Secondary | ICD-10-CM | POA: Diagnosis not present

## 2022-07-30 DIAGNOSIS — N2581 Secondary hyperparathyroidism of renal origin: Secondary | ICD-10-CM | POA: Diagnosis not present

## 2022-07-30 DIAGNOSIS — N186 End stage renal disease: Secondary | ICD-10-CM | POA: Diagnosis not present

## 2022-08-02 DIAGNOSIS — N186 End stage renal disease: Secondary | ICD-10-CM | POA: Diagnosis not present

## 2022-08-02 DIAGNOSIS — Z992 Dependence on renal dialysis: Secondary | ICD-10-CM | POA: Diagnosis not present

## 2022-08-02 DIAGNOSIS — N2581 Secondary hyperparathyroidism of renal origin: Secondary | ICD-10-CM | POA: Diagnosis not present

## 2022-08-04 DIAGNOSIS — N186 End stage renal disease: Secondary | ICD-10-CM | POA: Diagnosis not present

## 2022-08-04 DIAGNOSIS — Z992 Dependence on renal dialysis: Secondary | ICD-10-CM | POA: Diagnosis not present

## 2022-08-04 DIAGNOSIS — N2581 Secondary hyperparathyroidism of renal origin: Secondary | ICD-10-CM | POA: Diagnosis not present

## 2022-08-06 DIAGNOSIS — Z992 Dependence on renal dialysis: Secondary | ICD-10-CM | POA: Diagnosis not present

## 2022-08-06 DIAGNOSIS — N2581 Secondary hyperparathyroidism of renal origin: Secondary | ICD-10-CM | POA: Diagnosis not present

## 2022-08-06 DIAGNOSIS — N186 End stage renal disease: Secondary | ICD-10-CM | POA: Diagnosis not present

## 2022-08-09 DIAGNOSIS — N186 End stage renal disease: Secondary | ICD-10-CM | POA: Diagnosis not present

## 2022-08-09 DIAGNOSIS — N2581 Secondary hyperparathyroidism of renal origin: Secondary | ICD-10-CM | POA: Diagnosis not present

## 2022-08-09 DIAGNOSIS — Z992 Dependence on renal dialysis: Secondary | ICD-10-CM | POA: Diagnosis not present

## 2022-08-11 DIAGNOSIS — D631 Anemia in chronic kidney disease: Secondary | ICD-10-CM | POA: Diagnosis not present

## 2022-08-11 DIAGNOSIS — N2581 Secondary hyperparathyroidism of renal origin: Secondary | ICD-10-CM | POA: Diagnosis not present

## 2022-08-11 DIAGNOSIS — N186 End stage renal disease: Secondary | ICD-10-CM | POA: Diagnosis not present

## 2022-08-11 DIAGNOSIS — N200 Calculus of kidney: Secondary | ICD-10-CM | POA: Diagnosis not present

## 2022-08-11 DIAGNOSIS — N281 Cyst of kidney, acquired: Secondary | ICD-10-CM | POA: Diagnosis not present

## 2022-08-11 DIAGNOSIS — C61 Malignant neoplasm of prostate: Secondary | ICD-10-CM | POA: Diagnosis not present

## 2022-08-11 DIAGNOSIS — Z992 Dependence on renal dialysis: Secondary | ICD-10-CM | POA: Diagnosis not present

## 2022-08-13 DIAGNOSIS — N2581 Secondary hyperparathyroidism of renal origin: Secondary | ICD-10-CM | POA: Diagnosis not present

## 2022-08-13 DIAGNOSIS — I129 Hypertensive chronic kidney disease with stage 1 through stage 4 chronic kidney disease, or unspecified chronic kidney disease: Secondary | ICD-10-CM | POA: Diagnosis not present

## 2022-08-13 DIAGNOSIS — N186 End stage renal disease: Secondary | ICD-10-CM | POA: Diagnosis not present

## 2022-08-13 DIAGNOSIS — Z992 Dependence on renal dialysis: Secondary | ICD-10-CM | POA: Diagnosis not present

## 2022-08-16 DIAGNOSIS — N186 End stage renal disease: Secondary | ICD-10-CM | POA: Diagnosis not present

## 2022-08-16 DIAGNOSIS — Z992 Dependence on renal dialysis: Secondary | ICD-10-CM | POA: Diagnosis not present

## 2022-08-17 DIAGNOSIS — Z992 Dependence on renal dialysis: Secondary | ICD-10-CM | POA: Diagnosis not present

## 2022-08-17 DIAGNOSIS — N186 End stage renal disease: Secondary | ICD-10-CM | POA: Diagnosis not present

## 2022-08-19 DIAGNOSIS — Z992 Dependence on renal dialysis: Secondary | ICD-10-CM | POA: Diagnosis not present

## 2022-08-19 DIAGNOSIS — N186 End stage renal disease: Secondary | ICD-10-CM | POA: Diagnosis not present

## 2022-08-23 DIAGNOSIS — Z992 Dependence on renal dialysis: Secondary | ICD-10-CM | POA: Diagnosis not present

## 2022-08-23 DIAGNOSIS — N2581 Secondary hyperparathyroidism of renal origin: Secondary | ICD-10-CM | POA: Diagnosis not present

## 2022-08-23 DIAGNOSIS — N186 End stage renal disease: Secondary | ICD-10-CM | POA: Diagnosis not present

## 2022-08-25 DIAGNOSIS — Z992 Dependence on renal dialysis: Secondary | ICD-10-CM | POA: Diagnosis not present

## 2022-08-25 DIAGNOSIS — N2581 Secondary hyperparathyroidism of renal origin: Secondary | ICD-10-CM | POA: Diagnosis not present

## 2022-08-25 DIAGNOSIS — N186 End stage renal disease: Secondary | ICD-10-CM | POA: Diagnosis not present

## 2022-08-27 DIAGNOSIS — N2581 Secondary hyperparathyroidism of renal origin: Secondary | ICD-10-CM | POA: Diagnosis not present

## 2022-08-27 DIAGNOSIS — Z992 Dependence on renal dialysis: Secondary | ICD-10-CM | POA: Diagnosis not present

## 2022-08-27 DIAGNOSIS — N186 End stage renal disease: Secondary | ICD-10-CM | POA: Diagnosis not present

## 2022-08-29 DIAGNOSIS — N200 Calculus of kidney: Secondary | ICD-10-CM | POA: Diagnosis not present

## 2022-08-30 DIAGNOSIS — Z992 Dependence on renal dialysis: Secondary | ICD-10-CM | POA: Diagnosis not present

## 2022-08-30 DIAGNOSIS — N186 End stage renal disease: Secondary | ICD-10-CM | POA: Diagnosis not present

## 2022-08-30 DIAGNOSIS — N2581 Secondary hyperparathyroidism of renal origin: Secondary | ICD-10-CM | POA: Diagnosis not present

## 2022-09-01 DIAGNOSIS — N2581 Secondary hyperparathyroidism of renal origin: Secondary | ICD-10-CM | POA: Diagnosis not present

## 2022-09-01 DIAGNOSIS — Z992 Dependence on renal dialysis: Secondary | ICD-10-CM | POA: Diagnosis not present

## 2022-09-01 DIAGNOSIS — N186 End stage renal disease: Secondary | ICD-10-CM | POA: Diagnosis not present

## 2022-09-03 DIAGNOSIS — N186 End stage renal disease: Secondary | ICD-10-CM | POA: Diagnosis not present

## 2022-09-03 DIAGNOSIS — Z992 Dependence on renal dialysis: Secondary | ICD-10-CM | POA: Diagnosis not present

## 2022-09-03 DIAGNOSIS — N2581 Secondary hyperparathyroidism of renal origin: Secondary | ICD-10-CM | POA: Diagnosis not present

## 2022-09-06 DIAGNOSIS — N2581 Secondary hyperparathyroidism of renal origin: Secondary | ICD-10-CM | POA: Diagnosis not present

## 2022-09-06 DIAGNOSIS — Z992 Dependence on renal dialysis: Secondary | ICD-10-CM | POA: Diagnosis not present

## 2022-09-06 DIAGNOSIS — N186 End stage renal disease: Secondary | ICD-10-CM | POA: Diagnosis not present

## 2022-09-08 ENCOUNTER — Ambulatory Visit: Payer: Medicare Other | Admitting: Family Medicine

## 2022-09-08 DIAGNOSIS — N2581 Secondary hyperparathyroidism of renal origin: Secondary | ICD-10-CM | POA: Diagnosis not present

## 2022-09-08 DIAGNOSIS — Z992 Dependence on renal dialysis: Secondary | ICD-10-CM | POA: Diagnosis not present

## 2022-09-08 DIAGNOSIS — N186 End stage renal disease: Secondary | ICD-10-CM | POA: Diagnosis not present

## 2022-09-10 DIAGNOSIS — Z992 Dependence on renal dialysis: Secondary | ICD-10-CM | POA: Diagnosis not present

## 2022-09-10 DIAGNOSIS — N2581 Secondary hyperparathyroidism of renal origin: Secondary | ICD-10-CM | POA: Diagnosis not present

## 2022-09-10 DIAGNOSIS — N186 End stage renal disease: Secondary | ICD-10-CM | POA: Diagnosis not present

## 2022-09-12 ENCOUNTER — Encounter: Payer: Self-pay | Admitting: Family Medicine

## 2022-09-12 ENCOUNTER — Telehealth: Payer: Self-pay | Admitting: Family Medicine

## 2022-09-12 ENCOUNTER — Ambulatory Visit (INDEPENDENT_AMBULATORY_CARE_PROVIDER_SITE_OTHER): Payer: Medicare Other

## 2022-09-12 ENCOUNTER — Ambulatory Visit (INDEPENDENT_AMBULATORY_CARE_PROVIDER_SITE_OTHER): Payer: Medicare Other | Admitting: Family Medicine

## 2022-09-12 VITALS — BP 120/68 | HR 75 | Ht 72.0 in | Wt 174.0 lb

## 2022-09-12 VITALS — BP 120/78 | Ht 72.0 in | Wt 174.0 lb

## 2022-09-12 DIAGNOSIS — F17210 Nicotine dependence, cigarettes, uncomplicated: Secondary | ICD-10-CM

## 2022-09-12 DIAGNOSIS — N186 End stage renal disease: Secondary | ICD-10-CM | POA: Diagnosis not present

## 2022-09-12 DIAGNOSIS — Z992 Dependence on renal dialysis: Secondary | ICD-10-CM | POA: Diagnosis not present

## 2022-09-12 DIAGNOSIS — I129 Hypertensive chronic kidney disease with stage 1 through stage 4 chronic kidney disease, or unspecified chronic kidney disease: Secondary | ICD-10-CM | POA: Diagnosis not present

## 2022-09-12 DIAGNOSIS — I10 Essential (primary) hypertension: Secondary | ICD-10-CM | POA: Diagnosis not present

## 2022-09-12 DIAGNOSIS — Z0000 Encounter for general adult medical examination without abnormal findings: Secondary | ICD-10-CM

## 2022-09-12 NOTE — Telephone Encounter (Signed)
Copied from CRM [id number]. Topic: General - Inquiry >> Sep 12, 2022  1:21 PM [place] wrote: Reason for CRM:Pt is running behind for upcoming appointment made aware of late policy.  Please advise.

## 2022-09-12 NOTE — Telephone Encounter (Signed)
Noted  KP 

## 2022-09-12 NOTE — Patient Instructions (Signed)
Dwayne Reed , Thank you for taking time to come for your Medicare Wellness Visit. I appreciate your ongoing commitment to your health goals. Please review the following plan we discussed and let me know if I can assist you in the future.   These are the goals we discussed:  Goals      Quit Smoking     Steps to Quit Smoking Smoking tobacco is the leading cause of preventable death. It can affect almost every organ in the body. Smoking puts you and people around you at risk for many serious, long-lasting (chronic) diseases. Quitting smoking can be hard, but it is one of the best things that you can do for your health. It is never too late to quit. Do not give up if you cannot quit the first time. Some people need to try many times to quit. Do your best to stick to your quit plan, and talk with your doctor if you have any questions or concerns. How do I get ready to quit? Pick a date to quit. Set a date within the next 2 weeks to give you time to prepare. Write down the reasons why you are quitting. Keep this list in places where you will see it often. Tell your family, friends, and co-workers that you are quitting. Their support is important. Talk with your doctor about the choices that may help you quit. Find out if your health insurance will pay for these treatments. Know the people, places, things, and activities that make you want to smoke (triggers). Avoid them. What first steps can I take to quit smoking? Throw away all cigarettes at home, at work, and in your car. Throw away the things that you use when you smoke, such as ashtrays and lighters. Clean your car. Empty the ashtray. Clean your home, including curtains and carpets. What can I do to help me quit smoking? Talk with your doctor about taking medicines and seeing a counselor. You are more likely to succeed when you do both. If you are pregnant or breastfeeding: Talk with your doctor about counseling or other ways to quit  smoking. Do not take medicine to help you quit smoking unless your doctor tells you to. Quit right away Quit smoking completely, instead of slowly cutting back on how much you smoke over a period of time. Stopping smoking right away may be more successful than slowly quitting. Go to counseling. In-person is best if this is an option. You are more likely to quit if you go to counseling sessions regularly. Take medicine You may take medicines to help you quit. Some medicines need a prescription, and some you can buy over-the-counter. Some medicines may contain a drug called nicotine to replace the nicotine in cigarettes. Medicines may: Help you stop having the desire to smoke (cravings). Help to stop the problems that come when you stop smoking (withdrawal symptoms). Your doctor may ask you to use: Nicotine patches, gum, or lozenges. Nicotine inhalers or sprays. Non-nicotine medicine that you take by mouth. Find resources Find resources and other ways to help you quit smoking and remain smoke-free after you quit. They include: Online chats with a Veterinary surgeon. Phone quitlines. Printed Materials engineer. Support groups or group counseling. Text messaging programs. Mobile phone apps. Use apps on your mobile phone or tablet that can help you stick to your quit plan. Examples of free services include Quit Guide from the CDC and smokefree.gov  What can I do to make it easier to quit?  Talk to your family and friends. Ask them to support and encourage you. Call a phone quitline, such as 1-800-QUIT-NOW, reach out to support groups, or work with a Veterinary surgeon. Ask people who smoke to not smoke around you. Avoid places that make you want to smoke, such as: Bars. Parties. Smoke-break areas at work. Spend time with people who do not smoke. Lower the stress in your life. Stress can make you want to smoke. Try these things to lower stress: Getting regular exercise. Doing deep-breathing  exercises. Doing yoga. Meditating. What benefits will I see if I quit smoking? Over time, you may have: A better sense of smell and taste. Less coughing and sore throat. A slower heart rate. Lower blood pressure. Clearer skin. Better breathing. Fewer sick days. Summary Quitting smoking can be hard, but it is one of the best things that you can do for your health. Do not give up if you cannot quit the first time. Some people need to try many times to quit. When you decide to quit smoking, make a plan to help you succeed. Quit smoking right away, not slowly over a period of time. When you start quitting, get help and support to keep you smoke-free. This information is not intended to replace advice given to you by your health care provider. Make sure you discuss any questions you have with your health care provider. Document Revised: 02/19/2021 Document Reviewed: 02/19/2021 Elsevier Patient Education  2024 [employer].         This is a list of the screening recommended for you and due dates:  Health Maintenance  Topic Date Due   COVID-19 Vaccine (4 - 2023-24 season) 09/28/2022*   Zoster (Shingles) Vaccine (1 of 2) 12/13/2022*   Flu Shot  10/13/2022   Medicare Annual Wellness Visit  09/12/2023   Colon Cancer Screening  03/14/2025   Pneumonia Vaccine  Completed   Hepatitis C Screening  Completed   HPV Vaccine  Aged Out   DTaP/Tdap/Td vaccine  Discontinued  *Topic was postponed. The date shown is not the original due date.

## 2022-09-12 NOTE — Progress Notes (Signed)
Date:  09/12/2022   Name:  Dwayne Reed   DOB:  [birth year]   MRN:  [id number]   Chief Complaint: Follow-up (Blood pressure check)  Hypertension This is a chronic problem. The current episode started more than 1 year ago. The problem has been gradually improving since onset. The problem is controlled. Pertinent negatives include no anxiety, blurred vision, chest pain, headaches, malaise/fatigue, neck pain, orthopnea, palpitations, peripheral edema, PND, shortness of breath or sweats. There are no associated agents to hypertension. Risk factors for coronary artery disease include dyslipidemia. Past treatments include lifestyle changes. The current treatment provides moderate improvement. There are no compliance problems.  Hypertensive end-organ damage includes kidney disease. There is no history of CAD/MI, CVA or left ventricular hypertrophy. There is no history of chronic renal disease, a hypertension causing med or renovascular disease.    Lab Results  Component Value Date   NA 146 (H) 03/09/2022   K 4.3 03/09/2022   CO2 25 03/09/2022   GLUCOSE 86 03/09/2022   BUN 39 (H) 03/09/2022   CREATININE 10.55 (H) 03/09/2022   CALCIUM 9.1 03/09/2022   EGFR 5 (L) 03/09/2022   GFRNONAA 6 (L) 02/26/2020   Lab Results  Component Value Date   CHOL 195 03/09/2022   HDL 47 03/09/2022   LDLCALC 129 (H) 03/09/2022   LDLDIRECT 152.0 03/27/2019   TRIG 105 03/09/2022   CHOLHDL 4.7 09/27/2019   No results found for: "TSH" No results found for: "HGBA1C" Lab Results  Component Value Date   WBC 4.3 03/09/2022   HGB 12.2 (L) 03/09/2022   HCT 36.6 (L) 03/09/2022   MCV 98 (H) 03/09/2022   PLT 130 (L) 03/09/2022   Lab Results  Component Value Date   ALT 14 03/09/2022   AST 10 03/09/2022   ALKPHOS 82 03/09/2022   BILITOT 0.4 03/09/2022   Lab Results  Component Value Date   VD25OH 34 09/27/2019     Review of Systems  Constitutional:  Negative for malaise/fatigue and unexpected weight change.   HENT:  Negative for trouble swallowing.   Eyes:  Negative for blurred vision and visual disturbance.  Respiratory:  Negative for shortness of breath.   Cardiovascular:  Negative for chest pain, palpitations, orthopnea and PND.  Gastrointestinal:  Negative for abdominal pain.  Musculoskeletal:  Negative for neck pain.  Neurological:  Negative for headaches.    Patient Active Problem List   Diagnosis Date Noted   Allergy, unspecified, initial encounter 11/18/2021   Anaphylactic shock, unspecified, initial encounter 11/18/2021   Abnormal ECG 08/19/2021   Hyperlipidemia, mixed 08/19/2021   Hypercalcemia 09/23/2020   Urinary tract infection with hematuria    Hydronephrosis with renal and ureteral calculus obstruction 02/24/2020   Breakdown of surgically created AV fistula, init (HCC) 10/23/2017   Decreased energy 10/23/2017   Mild protein-calorie malnutrition (HCC) 08/18/2017   Anemia in chronic kidney disease 07/29/2017   Coagulation defect, unspecified (HCC) 07/29/2017   Iron deficiency anemia, unspecified 07/29/2017   Secondary hyperparathyroidism of renal origin (HCC) 07/29/2017   ESRD on dialysis (HCC) 07/26/2017   SOB (shortness of breath) 07/25/2017   Recurrent nephrolithiasis 06/21/2017   Pneumonia 05/18/2017   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   COPD (chronic obstructive pulmonary disease) (HCC) 07/14/2016   History of renal cell cancer 07/12/2016   Olecranon bursitis 05/27/2016   Malignant tumor of prostate (HCC) 09/18/2015   Benign prostatic hyperplasia with lower urinary tract symptoms 08/05/2015   Pre-transplant evaluation for kidney transplant 08/05/2015   Acquired  arteriovenous fistula (HCC) 04/30/2015   ESRD on hemodialysis (HCC) 02/04/2015   Hyperparathyroidism (HCC) 02/04/2015   Hyperphosphatemia 02/04/2015   Anemia of renal disease 02/04/2015   History of transfusion 12/13/2014   Elevated prostate specific antigen (PSA) 08/18/2012   Essential  hypertension 08/18/2012   Gout 08/18/2012   Acquired cyst of kidney 04/13/2012    Allergies  Allergen Reactions   Cinacalcet Other (See Comments)    Other reaction(s): Unknown    Past Surgical History:  Procedure Laterality Date   CYSTOSCOPY/URETEROSCOPY/HOLMIUM LASER/STENT PLACEMENT N/A 02/25/2020   Procedure: CYSTOSCOPY/URETEROSCOPY/HOLMIUM LASER/STENT PLACEMENT;  Surgeon: Riki Altes, MD;  Location: [facility];  Service: Urology;  Laterality: N/A;   CYSTOSCOPY/URETEROSCOPY/HOLMIUM LASER/STENT PLACEMENT Right 03/09/2020   Procedure: CYSTOSCOPY/URETEROSCOPY/HOLMIUM LASER/STENT Exchange;  Surgeon: Riki Altes, MD;  Location: [facility];  Service: Urology;  Laterality: Right;   EXTRACORPOREAL SHOCK WAVE LITHOTRIPSY     LITHOTRIPSY     x 5   PROSTATE BIOPSY      Social History   Tobacco Use   Smoking status: Every Day    Packs/day: 0.50    Years: 30.00    Additional pack years: 0.00    Total pack years: 15.00    Types: Cigarettes   Smokeless tobacco: Never  Vaping Use   Vaping Use: Never used  Substance Use Topics   Alcohol use: Yes    Comment: on holidays   Drug use: Never     Medication list has been reviewed and updated.  Current Meds  Medication Sig   AURYXIA 1 GM 210 MG(Fe) tablet Take 3 tablets (630 mg total) by mouth 3 (three) times daily.   B Complex-C-Zn-Folic Acid (DIALYVITE 800-ZINC 15) 0.8 MG TABS SMARTSIG:1 Tablet(s) By Mouth Every Evening   Etelcalcetide HCl (PARSABIV IV) Etelcalcetide (Parsabiv)   Methoxy PEG-Epoetin Beta (MIRCERA IJ) Mircera   Multiple Vitamins-Minerals (ZINC PO) Take by mouth.   sevelamer carbonate (RENVELA) 800 MG tablet Take by mouth.   VELPHORO 500 MG chewable tablet Chew by mouth.       09/12/2022    1:41 PM 03/09/2022    8:50 AM 01/24/2022    1:17 PM 07/20/2021    1:20 PM  GAD 7 : Generalized Anxiety Score  Nervous, Anxious, on Edge 0 0 0 0  Control/stop worrying 0 0 0 0  Worry too much - different things 0 0 0 0   Trouble relaxing 0 0 0 0  Restless 0 0 0 0  Easily annoyed or irritable 0 0 1 0  Afraid - awful might happen 0 0 0 0  Total GAD 7 Score 0 0 1 0  Anxiety Difficulty Not difficult at all Not difficult at all Not difficult at all Not difficult at all       09/12/2022    1:41 PM 03/09/2022    8:50 AM 01/24/2022    1:17 PM  Depression screen PHQ 2/9  Decreased Interest 0 0 0  Down, Depressed, Hopeless 0 0 0  PHQ - 2 Score 0 0 0  Altered sleeping 0 0 0  Tired, decreased energy 0 0 0  Change in appetite 0 0 0  Feeling bad or failure about yourself  0 0 0  Trouble concentrating 0 0 0  Moving slowly or fidgety/restless 0 0 0  Suicidal thoughts 0 0 0  PHQ-9 Score 0 0 0  Difficult doing work/chores Not difficult at all Not difficult at all Not difficult at all    BP Readings from  Last 3 Encounters:  09/12/22 120/68  07/01/22 108/67  04/08/22 121/65    Physical Exam Vitals and nursing note reviewed.  HENT:     Head: Normocephalic.     Right Ear: External ear normal.     Left Ear: External ear normal.     Nose: Nose normal.  Eyes:     General: No scleral icterus.       Right eye: No discharge.        Left eye: No discharge.     Conjunctiva/sclera: Conjunctivae normal.     Pupils: Pupils are equal, round, and reactive to light.  Neck:     Thyroid: No thyromegaly.  Cardiovascular:     Rate and Rhythm: Normal rate and regular rhythm.     Heart sounds: Normal heart sounds. No murmur heard.    No friction rub. No gallop.  Pulmonary:     Effort: No respiratory distress.     Breath sounds: Normal breath sounds. No wheezing, rhonchi or rales.  Abdominal:     General: Bowel sounds are normal.     Palpations: Abdomen is soft. There is no mass.     Tenderness: There is no abdominal tenderness. There is no guarding or rebound.  Musculoskeletal:        General: No tenderness. Normal range of motion.     Cervical back: Neck supple.  Skin:    General: Skin is warm.     Findings:  No rash.  Neurological:     Mental Status: He is alert.     Wt Readings from Last 3 Encounters:  09/12/22 174 lb (78.9 kg)  04/08/22 174 lb 1.6 oz (79 kg)  03/09/22 180 lb (81.6 kg)    BP 120/68   Pulse 75   Ht 6' (1.829 m)   Wt 174 lb (78.9 kg)   SpO2 99%   BMI 23.60 kg/m   Assessment and Plan: 1. Chronic hypertension Chronic.  Controlled.  Stable.  Blood pressure is 120/68.  Asymptomatic.  Tolerating dialysis well which is in turn lowering the blood pressure during the week.  Patient will continue to be followed by nephrology and we will see him once a year basis to remain established in primary care.  2. Cigarette nicotine dependence without complication Patient has been advised of the health risks of smoking and counseled concerning cessation of tobacco products. I spent over 3 minutes for discussion and to answer questions.      Elizabeth Sauer, MD

## 2022-09-12 NOTE — Patient Instructions (Signed)

## 2022-09-12 NOTE — Progress Notes (Signed)
Subjective:   Dwayne Reed is a 67 y.o. male who presents for an Initial Medicare Annual Wellness Visit.  Visit Complete: In person  Review of Systems    Defer to PCP  Cardiac Risk Factors include: advanced age (>46men, >72 women);male gender;sedentary lifestyle;smoking/ tobacco exposure     Objective:    Today's Vitals   09/12/22 1428 09/12/22 1431  BP: 120/78   Weight: 174 lb (78.9 kg) 174 lb (78.9 kg)  Height: 6' (1.829 m) 6' (1.829 m)   Body mass index is 23.6 kg/m.     09/12/2022    2:32 PM 12/22/2021   12:40 PM 07/05/2021    9:19 AM 03/09/2020    6:10 AM 03/04/2020   11:19 AM 02/24/2020   12:25 PM 09/07/2017    9:55 AM  Advanced Directives  Does Patient Have a Medical Advance Directive? No No No No No No No  Would patient like information on creating a medical advance directive? No - Patient declined   No - Patient declined Yes (MAU/Ambulatory/Procedural Areas - Information given) No - Patient declined Yes (MAU/Ambulatory/Procedural Areas - Information given)    Current Medications (verified) Outpatient Encounter Medications as of 09/12/2022  Medication Sig   AURYXIA 1 GM 210 MG(Fe) tablet Take 3 tablets (630 mg total) by mouth 3 (three) times daily.   B Complex-C-Zn-Folic Acid (DIALYVITE 800-ZINC 15) 0.8 MG TABS SMARTSIG:1 Tablet(s) By Mouth Every Evening   Etelcalcetide HCl (PARSABIV IV) Etelcalcetide (Parsabiv)   Methoxy PEG-Epoetin Beta (MIRCERA IJ) Mircera   Multiple Vitamins-Minerals (ZINC PO) Take by mouth.   sevelamer carbonate (RENVELA) 800 MG tablet Take by mouth.   VELPHORO 500 MG chewable tablet Chew by mouth.   No facility-administered encounter medications on file as of 09/12/2022.    Allergies (verified) Cinacalcet   History: Past Medical History:  Diagnosis Date   Anemia    low iron   CHF (congestive heart failure) (HCC)    pt denies   COPD (chronic obstructive pulmonary disease) (HCC)    Dialysis patient (HCC)    History of kidney  stones    Hypertension    Kidney stones    Pneumonia    Renal disorder    ESRD on HD (M,W,F)   Past Surgical History:  Procedure Laterality Date   CYSTOSCOPY/URETEROSCOPY/HOLMIUM LASER/STENT PLACEMENT N/A 02/25/2020   Procedure: CYSTOSCOPY/URETEROSCOPY/HOLMIUM LASER/STENT PLACEMENT;  Surgeon: Riki Altes, MD;  Location: [facility] ORS;  Service: Urology;  Laterality: N/A;   CYSTOSCOPY/URETEROSCOPY/HOLMIUM LASER/STENT PLACEMENT Right 03/09/2020   Procedure: CYSTOSCOPY/URETEROSCOPY/HOLMIUM LASER/STENT Exchange;  Surgeon: Riki Altes, MD;  Location: [facility] ORS;  Service: Urology;  Laterality: Right;   EXTRACORPOREAL SHOCK WAVE LITHOTRIPSY     LITHOTRIPSY     x 5   PROSTATE BIOPSY     Family History  Problem Relation Age of Onset   Hypertension Mother    Heart disease Mother    Sarcoidosis Sister    Cancer Maternal Grandmother        type unknown   Social History   Socioeconomic History   Marital status: Married    Spouse name: Not on file   Number of children: 1   Years of education: Not on file   Highest education level: Not on file  Occupational History   Occupation: admin support speacialist  Tobacco Use   Smoking status: Every Day    Packs/day: 0.50    Years: 30.00    Additional pack years: 0.00    Total pack years: 15.00  Types: Cigarettes   Smokeless tobacco: Never  Vaping Use   Vaping Use: Never used  Substance and Sexual Activity   Alcohol use: Yes    Comment: on holidays   Drug use: Never   Sexual activity: Not Currently  Other Topics Concern   Not on file  Social History Narrative   Not on file   Social Determinants of Health   Financial Resource Strain: Low Risk  (09/12/2022)   Overall Financial Resource Strain (CARDIA)    Difficulty of Paying Living Expenses: Not hard at all  Food Insecurity: No Food Insecurity (09/12/2022)   Hunger Vital Sign    Worried About Running Out of Food in the Last Year: Never true    Ran Out of Food in the Last Year:  Never true  Transportation Needs: No Transportation Needs (09/12/2022)   PRAPARE - Administrator, Civil Service (Medical): No    Lack of Transportation (Non-Medical): No  Physical Activity: Inactive (09/12/2022)   Exercise Vital Sign    Days of Exercise per Week: 0 days    Minutes of Exercise per Session: 0 min  Stress: No Stress Concern Present (09/12/2022)   [employer] of Occupational Health - Occupational Stress Questionnaire    Feeling of Stress : Not at all  Social Connections: Not on file    Tobacco Counseling Ready to quit: Not Answered Counseling given: Not Answered   Clinical Intake:  Pre-visit preparation completed: Yes  Pain : No/denies pain     BMI - recorded: 23.6 Nutritional Status: BMI of 19-24  Normal Nutritional Risks: None Diabetes: No  How often do you need to have someone help you when you read instructions, pamphlets, or other written materials from your doctor or pharmacy?: 1 - Never  Interpreter Needed?: No  Information entered by :: Margaretha Sheffield, CMA   Activities of Daily Living    09/12/2022    2:32 PM  In your present state of health, do you have any difficulty performing the following activities:  Hearing? 0  Vision? 0  Difficulty concentrating or making decisions? 0  Walking or climbing stairs? 0  Dressing or bathing? 0  Doing errands, shopping? 0  Preparing Food and eating ? Y  Using the Toilet? N  In the past six months, have you accidently leaked urine? N  Do you have problems with loss of bowel control? N  Managing your Finances? N  Housekeeping or managing your Housekeeping? N    Patient Care Team: Duanne Limerick, MD as PCP - General (Family Medicine)  Indicate any recent Medical Services you may have received from other than [facility] providers in the past year (date may be approximate).     Assessment:   This is a routine wellness examination for [name].  Hearing/Vision screen Hearing Screening -  Comments:: No concerns at this time. Vision Screening - Comments:: No concerns at this time.  Dietary issues and exercise activities discussed:     Goals Addressed             This Visit's Progress    Quit Smoking       Steps to Quit Smoking Smoking tobacco is the leading cause of preventable death. It can affect almost every organ in the body. Smoking puts you and people around you at risk for many serious, long-lasting (chronic) diseases. Quitting smoking can be hard, but it is one of the best things that you can do for your health. It is never too late  to quit. Do not give up if you cannot quit the first time. Some people need to try many times to quit. Do your best to stick to your quit plan, and talk with your doctor if you have any questions or concerns. How do I get ready to quit? Pick a date to quit. Set a date within the next 2 weeks to give you time to prepare. Write down the reasons why you are quitting. Keep this list in places where you will see it often. Tell your family, friends, and co-workers that you are quitting. Their support is important. Talk with your doctor about the choices that may help you quit. Find out if your health insurance will pay for these treatments. Know the people, places, things, and activities that make you want to smoke (triggers). Avoid them. What first steps can I take to quit smoking? Throw away all cigarettes at home, at work, and in your car. Throw away the things that you use when you smoke, such as ashtrays and lighters. Clean your car. Empty the ashtray. Clean your home, including curtains and carpets. What can I do to help me quit smoking? Talk with your doctor about taking medicines and seeing a counselor. You are more likely to succeed when you do both. If you are pregnant or breastfeeding: Talk with your doctor about counseling or other ways to quit smoking. Do not take medicine to help you quit smoking unless your doctor tells you  to. Quit right away Quit smoking completely, instead of slowly cutting back on how much you smoke over a period of time. Stopping smoking right away may be more successful than slowly quitting. Go to counseling. In-person is best if this is an option. You are more likely to quit if you go to counseling sessions regularly. Take medicine You may take medicines to help you quit. Some medicines need a prescription, and some you can buy over-the-counter. Some medicines may contain a drug called nicotine to replace the nicotine in cigarettes. Medicines may: Help you stop having the desire to smoke (cravings). Help to stop the problems that come when you stop smoking (withdrawal symptoms). Your doctor may ask you to use: Nicotine patches, gum, or lozenges. Nicotine inhalers or sprays. Non-nicotine medicine that you take by mouth. Find resources Find resources and other ways to help you quit smoking and remain smoke-free after you quit. They include: Online chats with a Veterinary surgeon. Phone quitlines. Printed Materials engineer. Support groups or group counseling. Text messaging programs. Mobile phone apps. Use apps on your mobile phone or tablet that can help you stick to your quit plan. Examples of free services include Quit Guide from the CDC and smokefree.gov  What can I do to make it easier to quit?  Talk to your family and friends. Ask them to support and encourage you. Call a phone quitline, such as 1-800-QUIT-NOW, reach out to support groups, or work with a Veterinary surgeon. Ask people who smoke to not smoke around you. Avoid places that make you want to smoke, such as: Bars. Parties. Smoke-break areas at work. Spend time with people who do not smoke. Lower the stress in your life. Stress can make you want to smoke. Try these things to lower stress: Getting regular exercise. Doing deep-breathing exercises. Doing yoga. Meditating. What benefits will I see if I quit smoking? Over time, you  may have: A better sense of smell and taste. Less coughing and sore throat. A slower heart rate. Lower blood pressure. Clearer  skin. Better breathing. Fewer sick days. Summary Quitting smoking can be hard, but it is one of the best things that you can do for your health. Do not give up if you cannot quit the first time. Some people need to try many times to quit. When you decide to quit smoking, make a plan to help you succeed. Quit smoking right away, not slowly over a period of time. When you start quitting, get help and support to keep you smoke-free. This information is not intended to replace advice given to you by your health care provider. Make sure you discuss any questions you have with your health care provider. Document Revised: 02/19/2021 Document Reviewed: 02/19/2021 Elsevier Patient Education  2024 Elsevier Inc.       Depression Screen    09/12/2022    1:41 PM 03/09/2022    8:50 AM 01/24/2022    1:17 PM 07/20/2021    1:20 PM 10/20/2020    2:41 PM 03/27/2019    2:33 PM 10/23/2017    3:10 PM  PHQ 2/9 Scores  PHQ - 2 Score 0 0 0 0 0 0 0  PHQ- 9 Score 0 0 0 2 1      Fall Risk    09/12/2022    1:41 PM 03/09/2022    8:50 AM 01/24/2022    1:17 PM 10/20/2020    2:41 PM 03/27/2019    2:33 PM  Fall Risk   Falls in the past year? 0 0 0 0 0  Number falls in past yr: 0 0 0 0   Injury with Fall? 0 0 0 0   Risk for fall due to : No Fall Risks No Fall Risks No Fall Risks No Fall Risks   Follow up Falls evaluation completed Falls evaluation completed Falls evaluation completed Falls evaluation completed     MEDICARE RISK AT HOME:  Medicare Risk at Home - 09/12/22 1439     Any stairs in or around the home? Yes    If so, are there any without handrails? No    Home free of loose throw rugs in walkways, pet beds, electrical cords, etc? Yes    Adequate lighting in your home to reduce risk of falls? Yes    Life alert? No    Use of a cane, walker or w/c? No    Grab bars in the  bathroom? No    Shower chair or bench in shower? No    Elevated toilet seat or a handicapped toilet? No             TIMED UP AND GO:  Was the test performed? Yes  Length of time to ambulate 10 feet: 8 sec Gait steady and fast without use of assistive device    Cognitive Function:        09/12/2022    2:34 PM  6CIT Screen  What Year? 0 points  What month? 0 points  What time? 0 points  Count back from 20 0 points  Months in reverse 0 points  Repeat phrase 2 points  Total Score 2 points    Immunizations Immunization History  Administered Date(s) Administered   Hepatitis B, ADULT 09/11/2017, 11/13/2017, 03/13/2018, 04/13/2018, 06/11/2018, 07/04/2018, 08/08/2018, 12/05/2018   Hepb-cpg 12/24/2019, 01/16/2020, 02/13/2020, 04/18/2020   Influenza Split 12/13/2011, 12/26/2016   Influenza, High Dose Seasonal PF 12/17/2019, 12/31/2020   Influenza, Quadrivalent, Recombinant, Inj, Pf 01/01/2022   Influenza,inj,Quad PF,6+ Mos 12/26/2016, 11/13/2018   Influenza,inj,quad, With Preservative 01/15/2014   Influenza-Unspecified  12/13/2011, 01/15/2014, 12/22/2014, 12/26/2016   Moderna Sars-Covid-2 Vaccination 04/26/2019, 05/22/2019, 12/31/2019   Pneumococcal Conjugate-13 01/19/2018, 03/27/2019   Pneumococcal Polysaccharide-23 04/16/2014, 10/01/2014, 10/02/2019    TDAP status: Due, Education has been provided regarding the importance of this vaccine. Advised may receive this vaccine at local pharmacy or Health Dept. Aware to provide a copy of the vaccination record if obtained from local pharmacy or Health Dept. Verbalized acceptance and understanding.  Flu Vaccine status: Up to date  Pneumococcal vaccine status: Up to date  Covid-19 vaccine status: Completed vaccines  Qualifies for Shingles Vaccine? Yes   Zostavax completed No   Shingrix Completed?: No.    Education has been provided regarding the importance of this vaccine. Patient has been advised to call insurance company to  determine out of pocket expense if they have not yet received this vaccine. Advised may also receive vaccine at local pharmacy or Health Dept. Verbalized acceptance and understanding.  Screening Tests Health Maintenance  Topic Date Due   COVID-19 Vaccine (4 - 2023-24 season) 09/28/2022 (Originally 11/12/2021)   Zoster Vaccines- Shingrix (1 of 2) 12/13/2022 (Originally 03/24/1974)   INFLUENZA VACCINE  10/13/2022   Medicare Annual Wellness (AWV)  09/12/2023   Colonoscopy  03/14/2025   Pneumonia Vaccine 61+ Years old  Completed   Hepatitis C Screening  Completed   HPV VACCINES  Aged Out   DTaP/Tdap/Td  Discontinued    Health Maintenance  There are no preventive care reminders to display for this patient.   Colorectal cancer screening: Type of screening: Colonoscopy. Completed 03/15/2015. Repeat every 2-3 years  Lung Cancer Screening: (Low Dose CT Chest recommended if Age 48-80 years, 20 pack-year currently smoking OR have quit w/in 15years.) does qualify.   Lung Cancer Screening Referral: Patient declined.  Additional Screening:  Hepatitis C Screening: does qualify; Completed 01/09/2015  Vision Screening: Recommended annual ophthalmology exams for early detection of glaucoma and other disorders of the eye. Is the patient up to date with their annual eye exam?  Yes  Who is the provider or what is the name of the office in which the patient attends annual eye exams? [facility] [place]  Dental Screening: Recommended annual dental exams for proper oral hygiene  Community Resource Referral / Chronic Care Management: CRR required this visit?  No   CCM required this visit?  No    Plan:     I have personally reviewed and noted the following in the patient's chart:   Medical and social history Use of alcohol, tobacco or illicit drugs  Current medications and supplements including opioid prescriptions. Patient is not currently taking opioid prescriptions. Functional  ability and status Nutritional status Physical activity Advanced directives List of other physicians Hospitalizations, surgeries, and ER visits in previous 12 months Vitals Screenings to include cognitive, depression, and falls Referrals and appointments  In addition, I have reviewed and discussed with patient certain preventive protocols, quality metrics, and best practice recommendations. A written personalized care plan for preventive services as well as general preventive health recommendations were provided to patient.     Mariel Sleet, CMA   09/12/2022   After Visit Summary: printed for patient at front desk  Nurse Notes: None.

## 2022-09-13 DIAGNOSIS — N2581 Secondary hyperparathyroidism of renal origin: Secondary | ICD-10-CM | POA: Diagnosis not present

## 2022-09-13 DIAGNOSIS — Z992 Dependence on renal dialysis: Secondary | ICD-10-CM | POA: Diagnosis not present

## 2022-09-13 DIAGNOSIS — N186 End stage renal disease: Secondary | ICD-10-CM | POA: Diagnosis not present

## 2022-09-15 DIAGNOSIS — Z992 Dependence on renal dialysis: Secondary | ICD-10-CM | POA: Diagnosis not present

## 2022-09-15 DIAGNOSIS — N2581 Secondary hyperparathyroidism of renal origin: Secondary | ICD-10-CM | POA: Diagnosis not present

## 2022-09-15 DIAGNOSIS — N186 End stage renal disease: Secondary | ICD-10-CM | POA: Diagnosis not present

## 2022-09-17 DIAGNOSIS — Z992 Dependence on renal dialysis: Secondary | ICD-10-CM | POA: Diagnosis not present

## 2022-09-17 DIAGNOSIS — N2581 Secondary hyperparathyroidism of renal origin: Secondary | ICD-10-CM | POA: Diagnosis not present

## 2022-09-17 DIAGNOSIS — N186 End stage renal disease: Secondary | ICD-10-CM | POA: Diagnosis not present

## 2022-09-20 DIAGNOSIS — N186 End stage renal disease: Secondary | ICD-10-CM | POA: Diagnosis not present

## 2022-09-20 DIAGNOSIS — Z992 Dependence on renal dialysis: Secondary | ICD-10-CM | POA: Diagnosis not present

## 2022-09-20 DIAGNOSIS — N2581 Secondary hyperparathyroidism of renal origin: Secondary | ICD-10-CM | POA: Diagnosis not present

## 2022-09-22 DIAGNOSIS — N2581 Secondary hyperparathyroidism of renal origin: Secondary | ICD-10-CM | POA: Diagnosis not present

## 2022-09-22 DIAGNOSIS — Z992 Dependence on renal dialysis: Secondary | ICD-10-CM | POA: Diagnosis not present

## 2022-09-22 DIAGNOSIS — N186 End stage renal disease: Secondary | ICD-10-CM | POA: Diagnosis not present

## 2022-09-24 DIAGNOSIS — Z992 Dependence on renal dialysis: Secondary | ICD-10-CM | POA: Diagnosis not present

## 2022-09-24 DIAGNOSIS — N2581 Secondary hyperparathyroidism of renal origin: Secondary | ICD-10-CM | POA: Diagnosis not present

## 2022-09-24 DIAGNOSIS — N186 End stage renal disease: Secondary | ICD-10-CM | POA: Diagnosis not present

## 2022-09-27 DIAGNOSIS — N186 End stage renal disease: Secondary | ICD-10-CM | POA: Diagnosis not present

## 2022-09-27 DIAGNOSIS — Z992 Dependence on renal dialysis: Secondary | ICD-10-CM | POA: Diagnosis not present

## 2022-09-27 DIAGNOSIS — N2581 Secondary hyperparathyroidism of renal origin: Secondary | ICD-10-CM | POA: Diagnosis not present

## 2022-09-28 IMAGING — CT CT RENAL STONE PROTOCOL
2 of 4 series · 16 of 46 positions shown, 18 images · non-contrast
Comparison: October 17, 2017.

CLINICAL DATA: Hematuria.

EXAM:
CT ABDOMEN AND PELVIS WITHOUT CONTRAST
TECHNIQUE: Multidetector CT imaging of the abdomen and pelvis was performed
following the standard protocol without IV contrast.

[Series 2: stone full standard · axial · 0.84mm/px · z∈[-231,+189]mm · 13 of 92 slices shown, 15 images]
[im 4/92  soft-tissue]
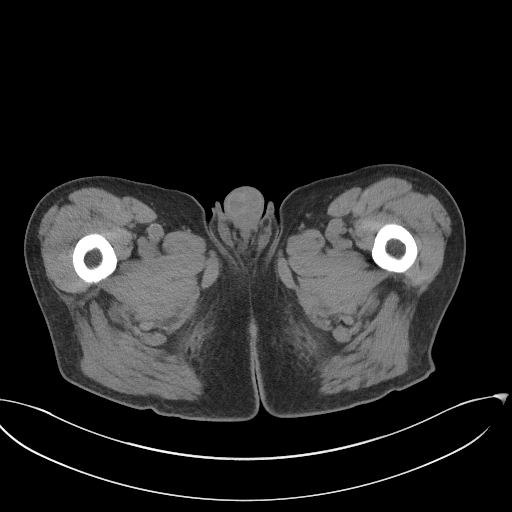
[im 4/92  bone]
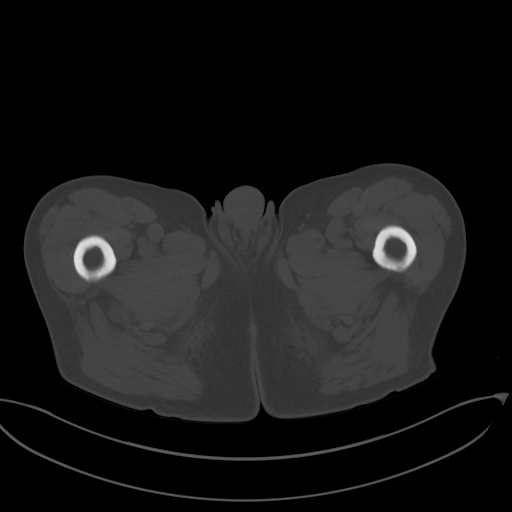
[im 11/92  soft-tissue]
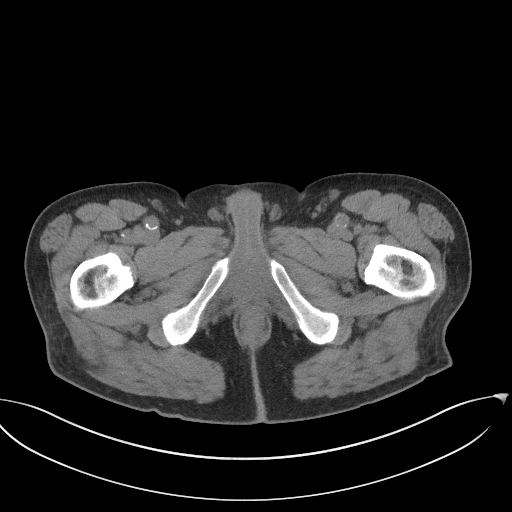
[im 19/92  soft-tissue]
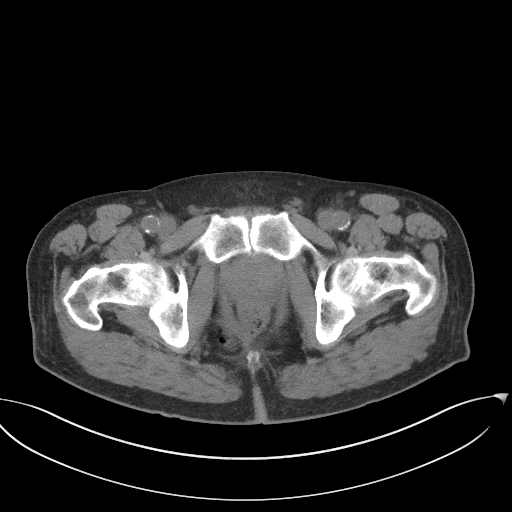
[im 26/92  soft-tissue]
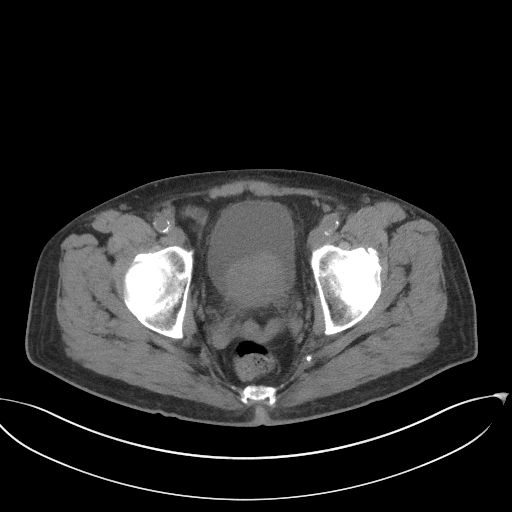
[im 33/92  soft-tissue]
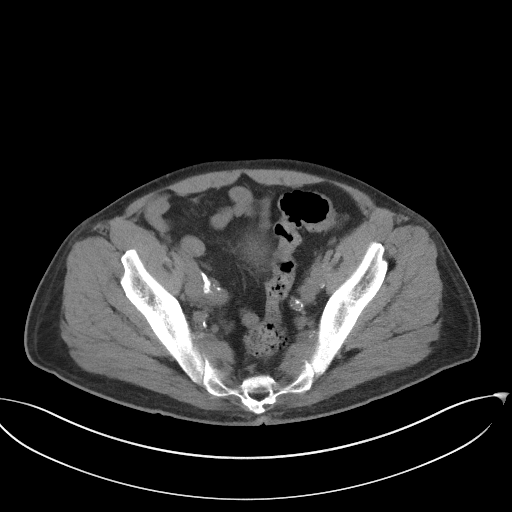
[im 41/92  soft-tissue]
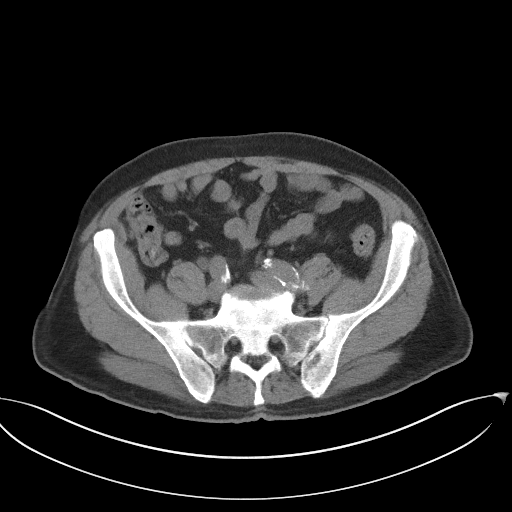
[im 48/92  soft-tissue]
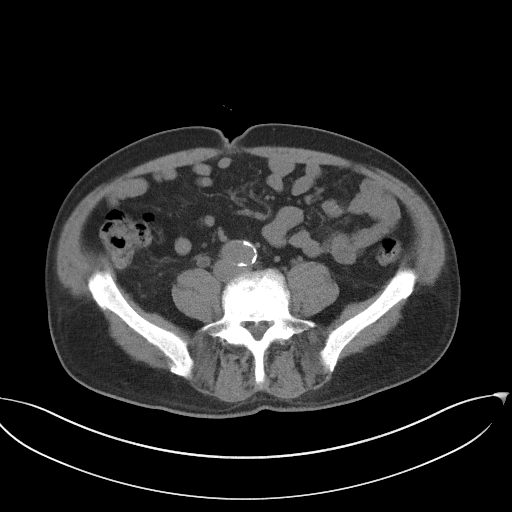
[im 51/92  soft-tissue]
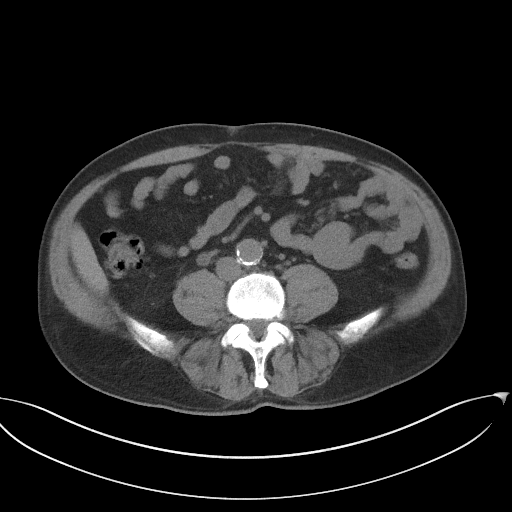
[im 59/92  soft-tissue]
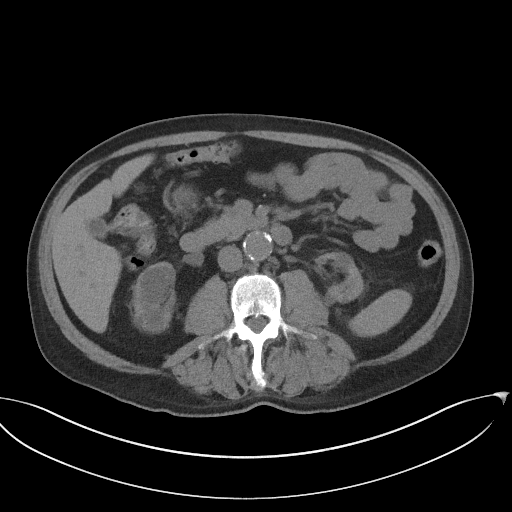
[im 59/92  bone]
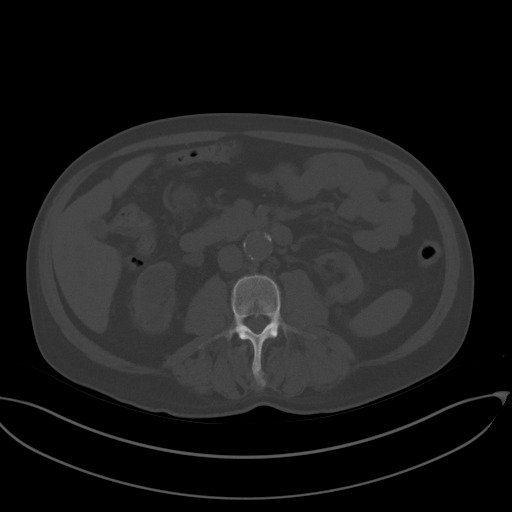
[im 66/92  soft-tissue]
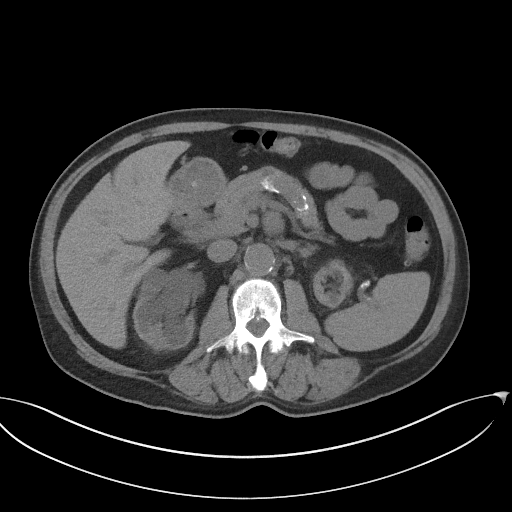
[im 73/92  soft-tissue]
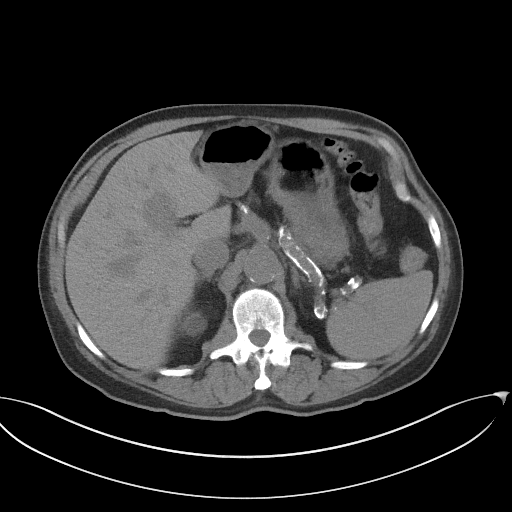
[im 81/92  soft-tissue]
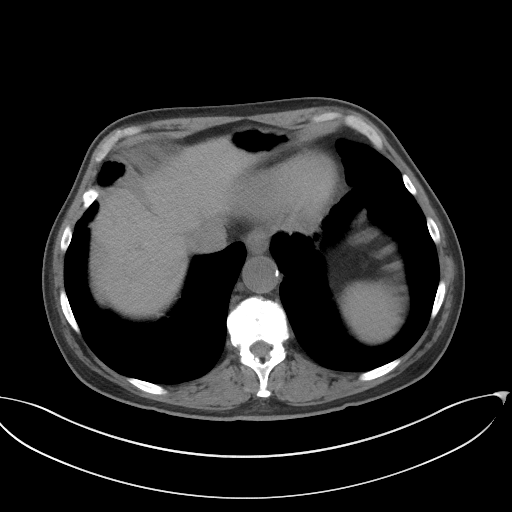
[im 88/92  soft-tissue]
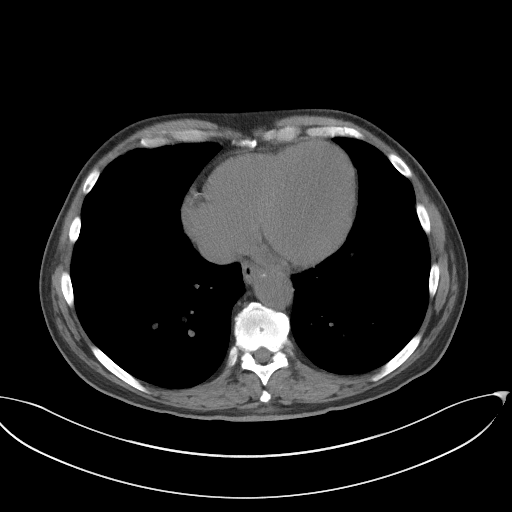

[Series 5: coronal · coronal · 0.75mm/px · 3 of 139 slices shown]
[im 47/139  soft-tissue]
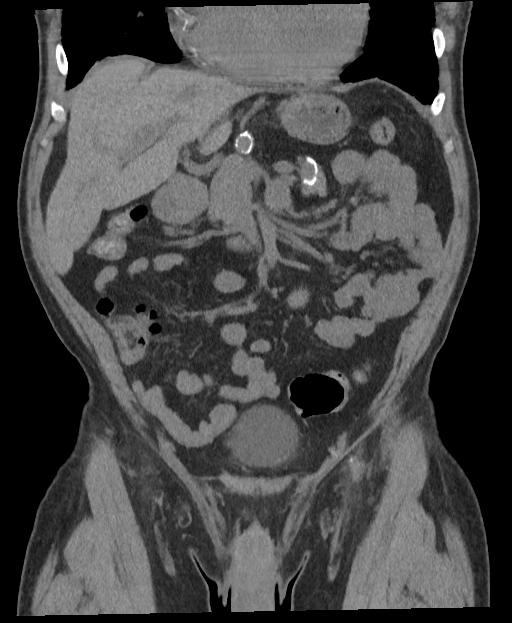
[im 62/139  soft-tissue]
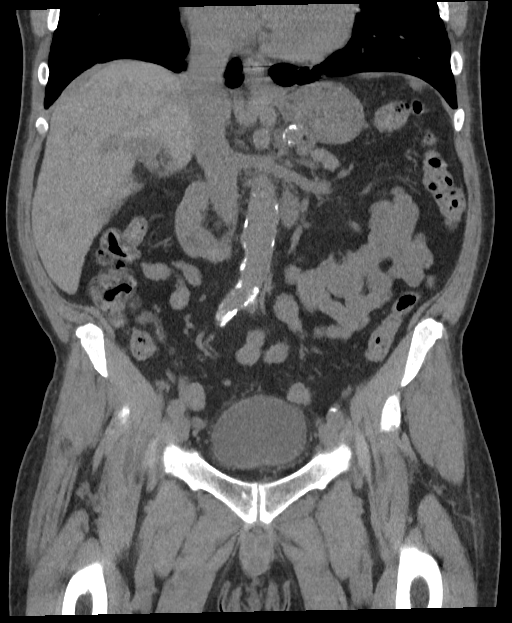
[im 77/139  soft-tissue]
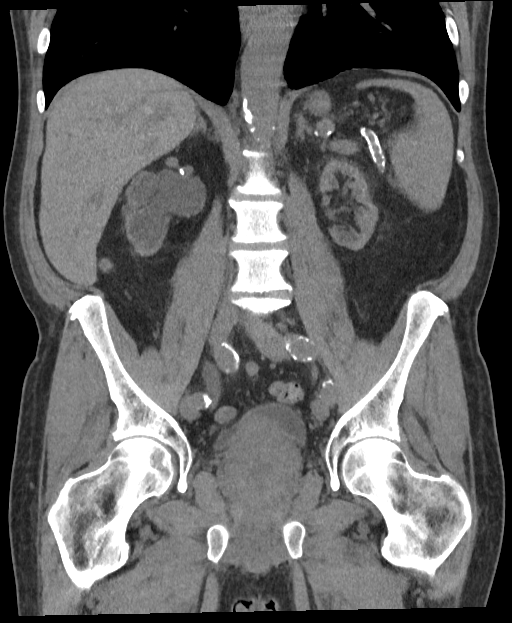

[16 of 46 positions shown; findings below may reference images not displayed]

FINDINGS: Lower chest: No acute abnormality.

Hepatobiliary: No focal liver abnormality is seen. No gallstones,
gallbladder wall thickening, or biliary dilatation.

Pancreas: Unremarkable. No pancreatic ductal dilatation or
surrounding inflammatory changes.

Spleen: Normal in size without focal abnormality.

Adrenals/Urinary Tract: Adrenal glands appear normal. Left renal
atrophy is noted. Nonobstructive left nephrolithiasis is noted.
Several left renal cysts are noted. Moderate right
hydroureteronephrosis is noted secondary to 7 mm calculus in distal
right ureter. Urinary bladder is unremarkable.

Stomach/Bowel: Stomach is within normal limits. Appendix appears
normal. No evidence of bowel wall thickening, distention, or
inflammatory changes.

Vascular/Lymphatic: Aortic atherosclerosis. No enlarged abdominal or
pelvic lymph nodes.

Reproductive: Stable moderate prostatic enlargement is noted.

Other: No abdominal wall hernia or abnormality. No abdominopelvic
ascites.

Musculoskeletal: No acute or significant osseous findings.
IMPRESSION: 1. Moderate right hydroureteronephrosis is noted secondary to 7 mm
distal right ureteral calculus.
2. Nonobstructive left nephrolithiasis.
3. Left renal atrophy.
4. Stable moderate prostatic enlargement.
5. Aortic atherosclerosis.

Aortic Atherosclerosis ([id number]-C0J.J).

## 2022-09-29 DIAGNOSIS — N186 End stage renal disease: Secondary | ICD-10-CM | POA: Diagnosis not present

## 2022-09-29 DIAGNOSIS — N2581 Secondary hyperparathyroidism of renal origin: Secondary | ICD-10-CM | POA: Diagnosis not present

## 2022-09-29 DIAGNOSIS — Z992 Dependence on renal dialysis: Secondary | ICD-10-CM | POA: Diagnosis not present

## 2022-09-30 ENCOUNTER — Ambulatory Visit: Payer: Medicare Other | Admitting: Podiatry

## 2022-10-01 DIAGNOSIS — Z992 Dependence on renal dialysis: Secondary | ICD-10-CM | POA: Diagnosis not present

## 2022-10-01 DIAGNOSIS — N2581 Secondary hyperparathyroidism of renal origin: Secondary | ICD-10-CM | POA: Diagnosis not present

## 2022-10-01 DIAGNOSIS — N186 End stage renal disease: Secondary | ICD-10-CM | POA: Diagnosis not present

## 2022-10-04 DIAGNOSIS — N2581 Secondary hyperparathyroidism of renal origin: Secondary | ICD-10-CM | POA: Diagnosis not present

## 2022-10-04 DIAGNOSIS — N186 End stage renal disease: Secondary | ICD-10-CM | POA: Diagnosis not present

## 2022-10-04 DIAGNOSIS — Z992 Dependence on renal dialysis: Secondary | ICD-10-CM | POA: Diagnosis not present

## 2022-10-06 DIAGNOSIS — Z992 Dependence on renal dialysis: Secondary | ICD-10-CM | POA: Diagnosis not present

## 2022-10-06 DIAGNOSIS — N2581 Secondary hyperparathyroidism of renal origin: Secondary | ICD-10-CM | POA: Diagnosis not present

## 2022-10-06 DIAGNOSIS — N186 End stage renal disease: Secondary | ICD-10-CM | POA: Diagnosis not present

## 2022-10-07 ENCOUNTER — Ambulatory Visit: Payer: Medicare Other | Admitting: Podiatry

## 2022-10-07 ENCOUNTER — Encounter: Payer: Self-pay | Admitting: Podiatry

## 2022-10-07 VITALS — BP 118/71

## 2022-10-07 DIAGNOSIS — D689 Coagulation defect, unspecified: Secondary | ICD-10-CM

## 2022-10-07 DIAGNOSIS — B351 Tinea unguium: Secondary | ICD-10-CM | POA: Diagnosis not present

## 2022-10-07 DIAGNOSIS — N186 End stage renal disease: Secondary | ICD-10-CM

## 2022-10-07 DIAGNOSIS — M79675 Pain in left toe(s): Secondary | ICD-10-CM | POA: Diagnosis not present

## 2022-10-07 DIAGNOSIS — M79674 Pain in right toe(s): Secondary | ICD-10-CM | POA: Diagnosis not present

## 2022-10-07 DIAGNOSIS — L84 Corns and callosities: Secondary | ICD-10-CM | POA: Diagnosis not present

## 2022-10-07 DIAGNOSIS — Z992 Dependence on renal dialysis: Secondary | ICD-10-CM

## 2022-10-07 DIAGNOSIS — Q828 Other specified congenital malformations of skin: Secondary | ICD-10-CM

## 2022-10-07 NOTE — Progress Notes (Signed)
Subjective:  Patient ID: Dwayne Reed, male    DOB: 01/01/1956,  MRN: [id number]  Dwayne Reed presents to clinic today for at risk foot care. Patient has h/o ESRD on hemodialysis and callus(es) both feet, porokeratotic lesion(s) both feet, and painful mycotic nails. Painful toenails interfere with ambulation. Aggravating factors include wearing enclosed shoe gear. Pain is relieved with periodic professional debridement. Painful callus(es) and porokeratotic lesion(s) are aggravated when weightbearing with and without shoegear. Pain is relieved with periodic professional debridement. Patient states he would like to discuss other treatments available for painful hammertoes right foot, more specifically right 2nd digit which develops a corn. He is seen for periodic debridements of lesion, but it can be painful when wearing leather shoes. Chief Complaint  Patient presents with   Nail Problem    RFC,Referring Provider Duanne Limerick, MD  Lov:07/24    Callouses    B/L feet   New problem(s): None.   PCP is Duanne Limerick, MD.  Allergies  Allergen Reactions   Cinacalcet Other (See Comments)    Other reaction(s): Unknown    Review of Systems: Negative except as noted in the HPI.  Objective: No changes noted in today's physical examination. Vitals:   10/07/22 0841  BP: 118/71   Dwayne Reed is a pleasant 67 y.o. male WD, WN in NAD. AAO x 3.  CFT <3 seconds b/l LE. Palpable pedal pulses b/l LE. Pedal hair absent. No pain with calf compression b/l. Lower extremity skin temperature gradient within normal limits. No edema noted b/l LE. No cyanosis or clubbing noted b/l LE.  Protective sensation intact 5/5 intact bilaterally with 10g monofilament b/l. Vibratory sensation intact b/l.  Pedal skin is warm and supple b/l.  No open wounds b/l lower extremities. No interdigital macerations b/l lower extremities.   Toenails 1-5 b/l elongated, discolored, dystrophic, thickened, crumbly with  subungual debris and tenderness to dorsal palpation.   Hyperkeratotic lesion(s) R 2nd toe, submet head 1 left foot.   Porokeratotic lesions submet head  2 b/l. No erythema, no edema, no drainage, no fluctuance.  Normal muscle strength 5/5 to all lower extremity muscle groups bilaterally. Patient ambulates independent of any assistive aids. HAV with bunion deformity noted b/l LE. Hammertoe deformity noted 2-5 b/l.   Assessment/Plan: 1. Pain due to onychomycosis of toenails of both feet   2. Porokeratosis   3. Callus   4. ESRD on dialysis (HCC)   5. Coagulation defect, unspecified (HCC)    -Patient was evaluated and treated. All patient's and/or POA's questions/concerns answered on today's visit. -Consent given for treatment as described below: -Patient to continue soft, supportive shoe gear daily. -Toenails 1-5 b/l were debrided in length and girth with sterile nail nippers and dremel without iatrogenic bleeding.  -Corn(s) dorsal PIPJ of R 2nd toe pared utilizing sterile scalpel blade without complication or incident. Total number debrided=1. -Callus(es) submet head 1 left foot pared utilizing sterile scalpel blade without complication or incident. Total number debrided =1. -Porokeratotic lesion(s) submet head 2 b/l pared and enucleated with sterile currette without incident. Total number of lesions debrided=2. -Patient is seen periodically for routine care due to painful lesions and digital deformities b/l. He has been compliant with digital padding. He would like to explore other options for his painful condition. He was referred to Dr. Sharl Ma for evaluation of painful hammertoe right 2nd digit with plantar porokeratosis. -Patient/POA to call should there be question/concern in the interim.   Return in about 3 months (around 01/07/2023).  Freddie Breech, DPM

## 2022-10-08 DIAGNOSIS — N2581 Secondary hyperparathyroidism of renal origin: Secondary | ICD-10-CM | POA: Diagnosis not present

## 2022-10-08 DIAGNOSIS — Z992 Dependence on renal dialysis: Secondary | ICD-10-CM | POA: Diagnosis not present

## 2022-10-08 DIAGNOSIS — N186 End stage renal disease: Secondary | ICD-10-CM | POA: Diagnosis not present

## 2022-10-11 DIAGNOSIS — Z992 Dependence on renal dialysis: Secondary | ICD-10-CM | POA: Diagnosis not present

## 2022-10-11 DIAGNOSIS — N186 End stage renal disease: Secondary | ICD-10-CM | POA: Diagnosis not present

## 2022-10-11 DIAGNOSIS — N2581 Secondary hyperparathyroidism of renal origin: Secondary | ICD-10-CM | POA: Diagnosis not present

## 2022-10-12 ENCOUNTER — Encounter: Payer: Self-pay | Admitting: Podiatry

## 2022-10-12 ENCOUNTER — Ambulatory Visit (INDEPENDENT_AMBULATORY_CARE_PROVIDER_SITE_OTHER): Payer: Medicare Other

## 2022-10-12 ENCOUNTER — Ambulatory Visit: Payer: Medicare Other | Admitting: Podiatry

## 2022-10-12 DIAGNOSIS — M2041 Other hammer toe(s) (acquired), right foot: Secondary | ICD-10-CM

## 2022-10-12 NOTE — Progress Notes (Signed)
  Subjective:  Patient ID: Dwayne Reed, male    DOB: 26-Jul-1955,  MRN: [id number]  Chief Complaint  Patient presents with   Bunions    "Dr. Eloy End wanted me to see him about my bunion."    67 y.o. male presents with the above complaint. History confirmed with patient.  He says the toe does not particularly hurt him all the time and certainly worse in tighter shoes and leather shoes.  He has a silicone pad that he occasionally wears.  Debridement of the lesion does help.   Objective:  Physical Exam: warm, good capillary refill, no trophic changes or ulcerative lesions, normal DP and PT pulses, normal sensory exam, and rigid hammertoe deformity of the right second and third toe semirigid third fourth and fifth toe deformities, dorsal callus forming.   Radiographs: Multiple views x-ray of the right foot: Digital contractures noted Assessment:   1. Hammer toe of right foot      Plan:  Patient was evaluated and treated and all questions answered.  I reviewed his x-rays and clinical symptoms with him.  He does have some relief with debridement and offloading and appropriate shoe gear.  He says he knows what shoes make this worse and he is going to try to avoid them.  I discussed with him surgical correction of the deformity this would require arthrodesis of the PIPJ and likely a Weil osteotomy with joint and tendon lengthenings and releases.  I do not think a simple flexor tenotomy or extensor tenotomy will offer much relief due to the rigidity of the deformity.  We discussed that surgical correction could be undertaken but with his ESRD he is at increased risk of nonhealing of skin soft tissue and bone.  He will follow-up as needed if he ultimately would like to proceed with surgical intervention with these risks.  Return if symptoms worsen or fail to improve.

## 2022-10-13 DIAGNOSIS — D631 Anemia in chronic kidney disease: Secondary | ICD-10-CM | POA: Diagnosis not present

## 2022-10-13 DIAGNOSIS — N2581 Secondary hyperparathyroidism of renal origin: Secondary | ICD-10-CM | POA: Diagnosis not present

## 2022-10-13 DIAGNOSIS — I129 Hypertensive chronic kidney disease with stage 1 through stage 4 chronic kidney disease, or unspecified chronic kidney disease: Secondary | ICD-10-CM | POA: Diagnosis not present

## 2022-10-13 DIAGNOSIS — D509 Iron deficiency anemia, unspecified: Secondary | ICD-10-CM | POA: Diagnosis not present

## 2022-10-13 DIAGNOSIS — Z992 Dependence on renal dialysis: Secondary | ICD-10-CM | POA: Diagnosis not present

## 2022-10-13 DIAGNOSIS — N186 End stage renal disease: Secondary | ICD-10-CM | POA: Diagnosis not present

## 2022-10-15 DIAGNOSIS — N186 End stage renal disease: Secondary | ICD-10-CM | POA: Diagnosis not present

## 2022-10-15 DIAGNOSIS — D509 Iron deficiency anemia, unspecified: Secondary | ICD-10-CM | POA: Diagnosis not present

## 2022-10-15 DIAGNOSIS — N2581 Secondary hyperparathyroidism of renal origin: Secondary | ICD-10-CM | POA: Diagnosis not present

## 2022-10-15 DIAGNOSIS — D631 Anemia in chronic kidney disease: Secondary | ICD-10-CM | POA: Diagnosis not present

## 2022-10-15 DIAGNOSIS — Z992 Dependence on renal dialysis: Secondary | ICD-10-CM | POA: Diagnosis not present

## 2022-10-18 DIAGNOSIS — N2581 Secondary hyperparathyroidism of renal origin: Secondary | ICD-10-CM | POA: Diagnosis not present

## 2022-10-18 DIAGNOSIS — D509 Iron deficiency anemia, unspecified: Secondary | ICD-10-CM | POA: Diagnosis not present

## 2022-10-18 DIAGNOSIS — D631 Anemia in chronic kidney disease: Secondary | ICD-10-CM | POA: Diagnosis not present

## 2022-10-18 DIAGNOSIS — Z992 Dependence on renal dialysis: Secondary | ICD-10-CM | POA: Diagnosis not present

## 2022-10-18 DIAGNOSIS — N186 End stage renal disease: Secondary | ICD-10-CM | POA: Diagnosis not present

## 2022-10-20 DIAGNOSIS — D631 Anemia in chronic kidney disease: Secondary | ICD-10-CM | POA: Diagnosis not present

## 2022-10-20 DIAGNOSIS — N186 End stage renal disease: Secondary | ICD-10-CM | POA: Diagnosis not present

## 2022-10-20 DIAGNOSIS — D509 Iron deficiency anemia, unspecified: Secondary | ICD-10-CM | POA: Diagnosis not present

## 2022-10-20 DIAGNOSIS — Z992 Dependence on renal dialysis: Secondary | ICD-10-CM | POA: Diagnosis not present

## 2022-10-20 DIAGNOSIS — N2581 Secondary hyperparathyroidism of renal origin: Secondary | ICD-10-CM | POA: Diagnosis not present

## 2022-10-22 DIAGNOSIS — D509 Iron deficiency anemia, unspecified: Secondary | ICD-10-CM | POA: Diagnosis not present

## 2022-10-22 DIAGNOSIS — N186 End stage renal disease: Secondary | ICD-10-CM | POA: Diagnosis not present

## 2022-10-22 DIAGNOSIS — D631 Anemia in chronic kidney disease: Secondary | ICD-10-CM | POA: Diagnosis not present

## 2022-10-22 DIAGNOSIS — N2581 Secondary hyperparathyroidism of renal origin: Secondary | ICD-10-CM | POA: Diagnosis not present

## 2022-10-22 DIAGNOSIS — Z992 Dependence on renal dialysis: Secondary | ICD-10-CM | POA: Diagnosis not present

## 2022-10-25 DIAGNOSIS — N2581 Secondary hyperparathyroidism of renal origin: Secondary | ICD-10-CM | POA: Diagnosis not present

## 2022-10-25 DIAGNOSIS — Z992 Dependence on renal dialysis: Secondary | ICD-10-CM | POA: Diagnosis not present

## 2022-10-25 DIAGNOSIS — D631 Anemia in chronic kidney disease: Secondary | ICD-10-CM | POA: Diagnosis not present

## 2022-10-25 DIAGNOSIS — N186 End stage renal disease: Secondary | ICD-10-CM | POA: Diagnosis not present

## 2022-10-25 DIAGNOSIS — D509 Iron deficiency anemia, unspecified: Secondary | ICD-10-CM | POA: Diagnosis not present

## 2022-10-27 DIAGNOSIS — D509 Iron deficiency anemia, unspecified: Secondary | ICD-10-CM | POA: Diagnosis not present

## 2022-10-27 DIAGNOSIS — Z992 Dependence on renal dialysis: Secondary | ICD-10-CM | POA: Diagnosis not present

## 2022-10-27 DIAGNOSIS — N186 End stage renal disease: Secondary | ICD-10-CM | POA: Diagnosis not present

## 2022-10-27 DIAGNOSIS — N2581 Secondary hyperparathyroidism of renal origin: Secondary | ICD-10-CM | POA: Diagnosis not present

## 2022-10-27 DIAGNOSIS — D631 Anemia in chronic kidney disease: Secondary | ICD-10-CM | POA: Diagnosis not present

## 2022-10-29 DIAGNOSIS — Z992 Dependence on renal dialysis: Secondary | ICD-10-CM | POA: Diagnosis not present

## 2022-10-29 DIAGNOSIS — D631 Anemia in chronic kidney disease: Secondary | ICD-10-CM | POA: Diagnosis not present

## 2022-10-29 DIAGNOSIS — N186 End stage renal disease: Secondary | ICD-10-CM | POA: Diagnosis not present

## 2022-10-29 DIAGNOSIS — N2581 Secondary hyperparathyroidism of renal origin: Secondary | ICD-10-CM | POA: Diagnosis not present

## 2022-10-29 DIAGNOSIS — D509 Iron deficiency anemia, unspecified: Secondary | ICD-10-CM | POA: Diagnosis not present

## 2022-11-01 DIAGNOSIS — N2581 Secondary hyperparathyroidism of renal origin: Secondary | ICD-10-CM | POA: Diagnosis not present

## 2022-11-01 DIAGNOSIS — D631 Anemia in chronic kidney disease: Secondary | ICD-10-CM | POA: Diagnosis not present

## 2022-11-01 DIAGNOSIS — D509 Iron deficiency anemia, unspecified: Secondary | ICD-10-CM | POA: Diagnosis not present

## 2022-11-01 DIAGNOSIS — Z992 Dependence on renal dialysis: Secondary | ICD-10-CM | POA: Diagnosis not present

## 2022-11-01 DIAGNOSIS — N186 End stage renal disease: Secondary | ICD-10-CM | POA: Diagnosis not present

## 2022-11-03 DIAGNOSIS — Z992 Dependence on renal dialysis: Secondary | ICD-10-CM | POA: Diagnosis not present

## 2022-11-03 DIAGNOSIS — N2581 Secondary hyperparathyroidism of renal origin: Secondary | ICD-10-CM | POA: Diagnosis not present

## 2022-11-03 DIAGNOSIS — D631 Anemia in chronic kidney disease: Secondary | ICD-10-CM | POA: Diagnosis not present

## 2022-11-03 DIAGNOSIS — N186 End stage renal disease: Secondary | ICD-10-CM | POA: Diagnosis not present

## 2022-11-03 DIAGNOSIS — D509 Iron deficiency anemia, unspecified: Secondary | ICD-10-CM | POA: Diagnosis not present

## 2022-11-05 DIAGNOSIS — D631 Anemia in chronic kidney disease: Secondary | ICD-10-CM | POA: Diagnosis not present

## 2022-11-05 DIAGNOSIS — N2581 Secondary hyperparathyroidism of renal origin: Secondary | ICD-10-CM | POA: Diagnosis not present

## 2022-11-05 DIAGNOSIS — N186 End stage renal disease: Secondary | ICD-10-CM | POA: Diagnosis not present

## 2022-11-05 DIAGNOSIS — Z992 Dependence on renal dialysis: Secondary | ICD-10-CM | POA: Diagnosis not present

## 2022-11-05 DIAGNOSIS — D509 Iron deficiency anemia, unspecified: Secondary | ICD-10-CM | POA: Diagnosis not present

## 2022-11-08 DIAGNOSIS — Z992 Dependence on renal dialysis: Secondary | ICD-10-CM | POA: Diagnosis not present

## 2022-11-08 DIAGNOSIS — N2581 Secondary hyperparathyroidism of renal origin: Secondary | ICD-10-CM | POA: Diagnosis not present

## 2022-11-08 DIAGNOSIS — N186 End stage renal disease: Secondary | ICD-10-CM | POA: Diagnosis not present

## 2022-11-08 DIAGNOSIS — D509 Iron deficiency anemia, unspecified: Secondary | ICD-10-CM | POA: Diagnosis not present

## 2022-11-08 DIAGNOSIS — D631 Anemia in chronic kidney disease: Secondary | ICD-10-CM | POA: Diagnosis not present

## 2022-11-10 DIAGNOSIS — N186 End stage renal disease: Secondary | ICD-10-CM | POA: Diagnosis not present

## 2022-11-10 DIAGNOSIS — D509 Iron deficiency anemia, unspecified: Secondary | ICD-10-CM | POA: Diagnosis not present

## 2022-11-10 DIAGNOSIS — Z992 Dependence on renal dialysis: Secondary | ICD-10-CM | POA: Diagnosis not present

## 2022-11-10 DIAGNOSIS — D631 Anemia in chronic kidney disease: Secondary | ICD-10-CM | POA: Diagnosis not present

## 2022-11-10 DIAGNOSIS — N2581 Secondary hyperparathyroidism of renal origin: Secondary | ICD-10-CM | POA: Diagnosis not present

## 2022-11-12 DIAGNOSIS — D509 Iron deficiency anemia, unspecified: Secondary | ICD-10-CM | POA: Diagnosis not present

## 2022-11-12 DIAGNOSIS — Z992 Dependence on renal dialysis: Secondary | ICD-10-CM | POA: Diagnosis not present

## 2022-11-12 DIAGNOSIS — N186 End stage renal disease: Secondary | ICD-10-CM | POA: Diagnosis not present

## 2022-11-12 DIAGNOSIS — N2581 Secondary hyperparathyroidism of renal origin: Secondary | ICD-10-CM | POA: Diagnosis not present

## 2022-11-12 DIAGNOSIS — D631 Anemia in chronic kidney disease: Secondary | ICD-10-CM | POA: Diagnosis not present

## 2022-11-13 DIAGNOSIS — Z992 Dependence on renal dialysis: Secondary | ICD-10-CM | POA: Diagnosis not present

## 2022-11-13 DIAGNOSIS — I129 Hypertensive chronic kidney disease with stage 1 through stage 4 chronic kidney disease, or unspecified chronic kidney disease: Secondary | ICD-10-CM | POA: Diagnosis not present

## 2022-11-13 DIAGNOSIS — N186 End stage renal disease: Secondary | ICD-10-CM | POA: Diagnosis not present

## 2022-11-15 DIAGNOSIS — Z992 Dependence on renal dialysis: Secondary | ICD-10-CM | POA: Diagnosis not present

## 2022-11-15 DIAGNOSIS — N2581 Secondary hyperparathyroidism of renal origin: Secondary | ICD-10-CM | POA: Diagnosis not present

## 2022-11-15 DIAGNOSIS — N186 End stage renal disease: Secondary | ICD-10-CM | POA: Diagnosis not present

## 2022-11-17 DIAGNOSIS — Z992 Dependence on renal dialysis: Secondary | ICD-10-CM | POA: Diagnosis not present

## 2022-11-17 DIAGNOSIS — N2581 Secondary hyperparathyroidism of renal origin: Secondary | ICD-10-CM | POA: Diagnosis not present

## 2022-11-17 DIAGNOSIS — N186 End stage renal disease: Secondary | ICD-10-CM | POA: Diagnosis not present

## 2022-11-19 DIAGNOSIS — N186 End stage renal disease: Secondary | ICD-10-CM | POA: Diagnosis not present

## 2022-11-19 DIAGNOSIS — Z992 Dependence on renal dialysis: Secondary | ICD-10-CM | POA: Diagnosis not present

## 2022-11-19 DIAGNOSIS — N2581 Secondary hyperparathyroidism of renal origin: Secondary | ICD-10-CM | POA: Diagnosis not present

## 2022-11-22 DIAGNOSIS — N186 End stage renal disease: Secondary | ICD-10-CM | POA: Diagnosis not present

## 2022-11-22 DIAGNOSIS — N2581 Secondary hyperparathyroidism of renal origin: Secondary | ICD-10-CM | POA: Diagnosis not present

## 2022-11-22 DIAGNOSIS — Z992 Dependence on renal dialysis: Secondary | ICD-10-CM | POA: Diagnosis not present

## 2022-11-24 DIAGNOSIS — N186 End stage renal disease: Secondary | ICD-10-CM | POA: Diagnosis not present

## 2022-11-24 DIAGNOSIS — Z992 Dependence on renal dialysis: Secondary | ICD-10-CM | POA: Diagnosis not present

## 2022-11-24 DIAGNOSIS — N2581 Secondary hyperparathyroidism of renal origin: Secondary | ICD-10-CM | POA: Diagnosis not present

## 2022-11-26 DIAGNOSIS — Z992 Dependence on renal dialysis: Secondary | ICD-10-CM | POA: Diagnosis not present

## 2022-11-26 DIAGNOSIS — N2581 Secondary hyperparathyroidism of renal origin: Secondary | ICD-10-CM | POA: Diagnosis not present

## 2022-11-26 DIAGNOSIS — N186 End stage renal disease: Secondary | ICD-10-CM | POA: Diagnosis not present

## 2022-11-29 DIAGNOSIS — Z992 Dependence on renal dialysis: Secondary | ICD-10-CM | POA: Diagnosis not present

## 2022-11-29 DIAGNOSIS — N186 End stage renal disease: Secondary | ICD-10-CM | POA: Diagnosis not present

## 2022-11-29 DIAGNOSIS — N2581 Secondary hyperparathyroidism of renal origin: Secondary | ICD-10-CM | POA: Diagnosis not present

## 2022-12-01 DIAGNOSIS — N2581 Secondary hyperparathyroidism of renal origin: Secondary | ICD-10-CM | POA: Diagnosis not present

## 2022-12-01 DIAGNOSIS — N186 End stage renal disease: Secondary | ICD-10-CM | POA: Diagnosis not present

## 2022-12-01 DIAGNOSIS — Z992 Dependence on renal dialysis: Secondary | ICD-10-CM | POA: Diagnosis not present

## 2022-12-03 DIAGNOSIS — N186 End stage renal disease: Secondary | ICD-10-CM | POA: Diagnosis not present

## 2022-12-03 DIAGNOSIS — N2581 Secondary hyperparathyroidism of renal origin: Secondary | ICD-10-CM | POA: Diagnosis not present

## 2022-12-03 DIAGNOSIS — Z992 Dependence on renal dialysis: Secondary | ICD-10-CM | POA: Diagnosis not present

## 2022-12-06 DIAGNOSIS — Z992 Dependence on renal dialysis: Secondary | ICD-10-CM | POA: Diagnosis not present

## 2022-12-06 DIAGNOSIS — N2581 Secondary hyperparathyroidism of renal origin: Secondary | ICD-10-CM | POA: Diagnosis not present

## 2022-12-06 DIAGNOSIS — N186 End stage renal disease: Secondary | ICD-10-CM | POA: Diagnosis not present

## 2022-12-08 DIAGNOSIS — N186 End stage renal disease: Secondary | ICD-10-CM | POA: Diagnosis not present

## 2022-12-08 DIAGNOSIS — N2581 Secondary hyperparathyroidism of renal origin: Secondary | ICD-10-CM | POA: Diagnosis not present

## 2022-12-08 DIAGNOSIS — Z992 Dependence on renal dialysis: Secondary | ICD-10-CM | POA: Diagnosis not present

## 2022-12-10 DIAGNOSIS — N186 End stage renal disease: Secondary | ICD-10-CM | POA: Diagnosis not present

## 2022-12-10 DIAGNOSIS — Z992 Dependence on renal dialysis: Secondary | ICD-10-CM | POA: Diagnosis not present

## 2022-12-10 DIAGNOSIS — N2581 Secondary hyperparathyroidism of renal origin: Secondary | ICD-10-CM | POA: Diagnosis not present

## 2022-12-13 DIAGNOSIS — I129 Hypertensive chronic kidney disease with stage 1 through stage 4 chronic kidney disease, or unspecified chronic kidney disease: Secondary | ICD-10-CM | POA: Diagnosis not present

## 2022-12-13 DIAGNOSIS — Z992 Dependence on renal dialysis: Secondary | ICD-10-CM | POA: Diagnosis not present

## 2022-12-13 DIAGNOSIS — Z23 Encounter for immunization: Secondary | ICD-10-CM | POA: Diagnosis not present

## 2022-12-13 DIAGNOSIS — N2581 Secondary hyperparathyroidism of renal origin: Secondary | ICD-10-CM | POA: Diagnosis not present

## 2022-12-13 DIAGNOSIS — N186 End stage renal disease: Secondary | ICD-10-CM | POA: Diagnosis not present

## 2022-12-15 DIAGNOSIS — N186 End stage renal disease: Secondary | ICD-10-CM | POA: Diagnosis not present

## 2022-12-15 DIAGNOSIS — Z23 Encounter for immunization: Secondary | ICD-10-CM | POA: Diagnosis not present

## 2022-12-15 DIAGNOSIS — N2581 Secondary hyperparathyroidism of renal origin: Secondary | ICD-10-CM | POA: Diagnosis not present

## 2022-12-15 DIAGNOSIS — Z992 Dependence on renal dialysis: Secondary | ICD-10-CM | POA: Diagnosis not present

## 2022-12-17 DIAGNOSIS — Z23 Encounter for immunization: Secondary | ICD-10-CM | POA: Diagnosis not present

## 2022-12-17 DIAGNOSIS — N2581 Secondary hyperparathyroidism of renal origin: Secondary | ICD-10-CM | POA: Diagnosis not present

## 2022-12-17 DIAGNOSIS — Z992 Dependence on renal dialysis: Secondary | ICD-10-CM | POA: Diagnosis not present

## 2022-12-17 DIAGNOSIS — N186 End stage renal disease: Secondary | ICD-10-CM | POA: Diagnosis not present

## 2022-12-20 DIAGNOSIS — Z23 Encounter for immunization: Secondary | ICD-10-CM | POA: Diagnosis not present

## 2022-12-20 DIAGNOSIS — N2581 Secondary hyperparathyroidism of renal origin: Secondary | ICD-10-CM | POA: Diagnosis not present

## 2022-12-20 DIAGNOSIS — Z992 Dependence on renal dialysis: Secondary | ICD-10-CM | POA: Diagnosis not present

## 2022-12-20 DIAGNOSIS — N186 End stage renal disease: Secondary | ICD-10-CM | POA: Diagnosis not present

## 2022-12-22 DIAGNOSIS — N2581 Secondary hyperparathyroidism of renal origin: Secondary | ICD-10-CM | POA: Diagnosis not present

## 2022-12-22 DIAGNOSIS — Z992 Dependence on renal dialysis: Secondary | ICD-10-CM | POA: Diagnosis not present

## 2022-12-22 DIAGNOSIS — N186 End stage renal disease: Secondary | ICD-10-CM | POA: Diagnosis not present

## 2022-12-22 DIAGNOSIS — Z23 Encounter for immunization: Secondary | ICD-10-CM | POA: Diagnosis not present

## 2022-12-23 DIAGNOSIS — N2581 Secondary hyperparathyroidism of renal origin: Secondary | ICD-10-CM | POA: Diagnosis not present

## 2022-12-23 DIAGNOSIS — N186 End stage renal disease: Secondary | ICD-10-CM | POA: Diagnosis not present

## 2022-12-23 DIAGNOSIS — Z992 Dependence on renal dialysis: Secondary | ICD-10-CM | POA: Diagnosis not present

## 2022-12-23 DIAGNOSIS — Z23 Encounter for immunization: Secondary | ICD-10-CM | POA: Diagnosis not present

## 2022-12-27 DIAGNOSIS — Z23 Encounter for immunization: Secondary | ICD-10-CM | POA: Diagnosis not present

## 2022-12-27 DIAGNOSIS — N2581 Secondary hyperparathyroidism of renal origin: Secondary | ICD-10-CM | POA: Diagnosis not present

## 2022-12-27 DIAGNOSIS — N186 End stage renal disease: Secondary | ICD-10-CM | POA: Diagnosis not present

## 2022-12-27 DIAGNOSIS — Z992 Dependence on renal dialysis: Secondary | ICD-10-CM | POA: Diagnosis not present

## 2022-12-29 DIAGNOSIS — Z23 Encounter for immunization: Secondary | ICD-10-CM | POA: Diagnosis not present

## 2022-12-29 DIAGNOSIS — N186 End stage renal disease: Secondary | ICD-10-CM | POA: Diagnosis not present

## 2022-12-29 DIAGNOSIS — N2581 Secondary hyperparathyroidism of renal origin: Secondary | ICD-10-CM | POA: Diagnosis not present

## 2022-12-29 DIAGNOSIS — Z992 Dependence on renal dialysis: Secondary | ICD-10-CM | POA: Diagnosis not present

## 2022-12-31 DIAGNOSIS — Z23 Encounter for immunization: Secondary | ICD-10-CM | POA: Diagnosis not present

## 2022-12-31 DIAGNOSIS — Z992 Dependence on renal dialysis: Secondary | ICD-10-CM | POA: Diagnosis not present

## 2022-12-31 DIAGNOSIS — N186 End stage renal disease: Secondary | ICD-10-CM | POA: Diagnosis not present

## 2022-12-31 DIAGNOSIS — N2581 Secondary hyperparathyroidism of renal origin: Secondary | ICD-10-CM | POA: Diagnosis not present

## 2023-01-03 DIAGNOSIS — N186 End stage renal disease: Secondary | ICD-10-CM | POA: Diagnosis not present

## 2023-01-03 DIAGNOSIS — Z23 Encounter for immunization: Secondary | ICD-10-CM | POA: Diagnosis not present

## 2023-01-03 DIAGNOSIS — Z992 Dependence on renal dialysis: Secondary | ICD-10-CM | POA: Diagnosis not present

## 2023-01-03 DIAGNOSIS — N2581 Secondary hyperparathyroidism of renal origin: Secondary | ICD-10-CM | POA: Diagnosis not present

## 2023-01-05 DIAGNOSIS — N186 End stage renal disease: Secondary | ICD-10-CM | POA: Diagnosis not present

## 2023-01-05 DIAGNOSIS — Z23 Encounter for immunization: Secondary | ICD-10-CM | POA: Diagnosis not present

## 2023-01-05 DIAGNOSIS — Z992 Dependence on renal dialysis: Secondary | ICD-10-CM | POA: Diagnosis not present

## 2023-01-05 DIAGNOSIS — N2581 Secondary hyperparathyroidism of renal origin: Secondary | ICD-10-CM | POA: Diagnosis not present

## 2023-01-07 DIAGNOSIS — N2581 Secondary hyperparathyroidism of renal origin: Secondary | ICD-10-CM | POA: Diagnosis not present

## 2023-01-07 DIAGNOSIS — Z23 Encounter for immunization: Secondary | ICD-10-CM | POA: Diagnosis not present

## 2023-01-07 DIAGNOSIS — N186 End stage renal disease: Secondary | ICD-10-CM | POA: Diagnosis not present

## 2023-01-07 DIAGNOSIS — Z992 Dependence on renal dialysis: Secondary | ICD-10-CM | POA: Diagnosis not present

## 2023-01-10 DIAGNOSIS — Z23 Encounter for immunization: Secondary | ICD-10-CM | POA: Diagnosis not present

## 2023-01-10 DIAGNOSIS — N186 End stage renal disease: Secondary | ICD-10-CM | POA: Diagnosis not present

## 2023-01-10 DIAGNOSIS — N2581 Secondary hyperparathyroidism of renal origin: Secondary | ICD-10-CM | POA: Diagnosis not present

## 2023-01-10 DIAGNOSIS — Z992 Dependence on renal dialysis: Secondary | ICD-10-CM | POA: Diagnosis not present

## 2023-01-12 DIAGNOSIS — N2581 Secondary hyperparathyroidism of renal origin: Secondary | ICD-10-CM | POA: Diagnosis not present

## 2023-01-12 DIAGNOSIS — Z23 Encounter for immunization: Secondary | ICD-10-CM | POA: Diagnosis not present

## 2023-01-12 DIAGNOSIS — Z992 Dependence on renal dialysis: Secondary | ICD-10-CM | POA: Diagnosis not present

## 2023-01-12 DIAGNOSIS — N186 End stage renal disease: Secondary | ICD-10-CM | POA: Diagnosis not present

## 2023-01-13 DIAGNOSIS — I129 Hypertensive chronic kidney disease with stage 1 through stage 4 chronic kidney disease, or unspecified chronic kidney disease: Secondary | ICD-10-CM | POA: Diagnosis not present

## 2023-01-13 DIAGNOSIS — N186 End stage renal disease: Secondary | ICD-10-CM | POA: Diagnosis not present

## 2023-01-13 DIAGNOSIS — Z992 Dependence on renal dialysis: Secondary | ICD-10-CM | POA: Diagnosis not present

## 2023-01-14 DIAGNOSIS — D631 Anemia in chronic kidney disease: Secondary | ICD-10-CM | POA: Diagnosis not present

## 2023-01-14 DIAGNOSIS — N2581 Secondary hyperparathyroidism of renal origin: Secondary | ICD-10-CM | POA: Diagnosis not present

## 2023-01-14 DIAGNOSIS — Z992 Dependence on renal dialysis: Secondary | ICD-10-CM | POA: Diagnosis not present

## 2023-01-14 DIAGNOSIS — D509 Iron deficiency anemia, unspecified: Secondary | ICD-10-CM | POA: Diagnosis not present

## 2023-01-14 DIAGNOSIS — N186 End stage renal disease: Secondary | ICD-10-CM | POA: Diagnosis not present

## 2023-01-17 DIAGNOSIS — D509 Iron deficiency anemia, unspecified: Secondary | ICD-10-CM | POA: Diagnosis not present

## 2023-01-17 DIAGNOSIS — N2581 Secondary hyperparathyroidism of renal origin: Secondary | ICD-10-CM | POA: Diagnosis not present

## 2023-01-17 DIAGNOSIS — N186 End stage renal disease: Secondary | ICD-10-CM | POA: Diagnosis not present

## 2023-01-17 DIAGNOSIS — Z992 Dependence on renal dialysis: Secondary | ICD-10-CM | POA: Diagnosis not present

## 2023-01-17 DIAGNOSIS — D631 Anemia in chronic kidney disease: Secondary | ICD-10-CM | POA: Diagnosis not present

## 2023-01-19 DIAGNOSIS — N2581 Secondary hyperparathyroidism of renal origin: Secondary | ICD-10-CM | POA: Diagnosis not present

## 2023-01-19 DIAGNOSIS — N186 End stage renal disease: Secondary | ICD-10-CM | POA: Diagnosis not present

## 2023-01-19 DIAGNOSIS — D509 Iron deficiency anemia, unspecified: Secondary | ICD-10-CM | POA: Diagnosis not present

## 2023-01-19 DIAGNOSIS — Z992 Dependence on renal dialysis: Secondary | ICD-10-CM | POA: Diagnosis not present

## 2023-01-19 DIAGNOSIS — D631 Anemia in chronic kidney disease: Secondary | ICD-10-CM | POA: Diagnosis not present

## 2023-01-21 DIAGNOSIS — N2581 Secondary hyperparathyroidism of renal origin: Secondary | ICD-10-CM | POA: Diagnosis not present

## 2023-01-21 DIAGNOSIS — D509 Iron deficiency anemia, unspecified: Secondary | ICD-10-CM | POA: Diagnosis not present

## 2023-01-21 DIAGNOSIS — Z992 Dependence on renal dialysis: Secondary | ICD-10-CM | POA: Diagnosis not present

## 2023-01-21 DIAGNOSIS — D631 Anemia in chronic kidney disease: Secondary | ICD-10-CM | POA: Diagnosis not present

## 2023-01-21 DIAGNOSIS — N186 End stage renal disease: Secondary | ICD-10-CM | POA: Diagnosis not present

## 2023-01-24 DIAGNOSIS — D631 Anemia in chronic kidney disease: Secondary | ICD-10-CM | POA: Diagnosis not present

## 2023-01-24 DIAGNOSIS — Z992 Dependence on renal dialysis: Secondary | ICD-10-CM | POA: Diagnosis not present

## 2023-01-24 DIAGNOSIS — D509 Iron deficiency anemia, unspecified: Secondary | ICD-10-CM | POA: Diagnosis not present

## 2023-01-24 DIAGNOSIS — N2581 Secondary hyperparathyroidism of renal origin: Secondary | ICD-10-CM | POA: Diagnosis not present

## 2023-01-24 DIAGNOSIS — N186 End stage renal disease: Secondary | ICD-10-CM | POA: Diagnosis not present

## 2023-01-26 DIAGNOSIS — D631 Anemia in chronic kidney disease: Secondary | ICD-10-CM | POA: Diagnosis not present

## 2023-01-26 DIAGNOSIS — Z992 Dependence on renal dialysis: Secondary | ICD-10-CM | POA: Diagnosis not present

## 2023-01-26 DIAGNOSIS — N2581 Secondary hyperparathyroidism of renal origin: Secondary | ICD-10-CM | POA: Diagnosis not present

## 2023-01-26 DIAGNOSIS — N186 End stage renal disease: Secondary | ICD-10-CM | POA: Diagnosis not present

## 2023-01-26 DIAGNOSIS — D509 Iron deficiency anemia, unspecified: Secondary | ICD-10-CM | POA: Diagnosis not present

## 2023-01-28 DIAGNOSIS — Z992 Dependence on renal dialysis: Secondary | ICD-10-CM | POA: Diagnosis not present

## 2023-01-28 DIAGNOSIS — N186 End stage renal disease: Secondary | ICD-10-CM | POA: Diagnosis not present

## 2023-01-28 DIAGNOSIS — D631 Anemia in chronic kidney disease: Secondary | ICD-10-CM | POA: Diagnosis not present

## 2023-01-28 DIAGNOSIS — D509 Iron deficiency anemia, unspecified: Secondary | ICD-10-CM | POA: Diagnosis not present

## 2023-01-28 DIAGNOSIS — N2581 Secondary hyperparathyroidism of renal origin: Secondary | ICD-10-CM | POA: Diagnosis not present

## 2023-01-31 ENCOUNTER — Ambulatory Visit: Payer: Medicare Other | Admitting: Family Medicine

## 2023-01-31 DIAGNOSIS — Z992 Dependence on renal dialysis: Secondary | ICD-10-CM | POA: Diagnosis not present

## 2023-01-31 DIAGNOSIS — D509 Iron deficiency anemia, unspecified: Secondary | ICD-10-CM | POA: Diagnosis not present

## 2023-01-31 DIAGNOSIS — D631 Anemia in chronic kidney disease: Secondary | ICD-10-CM | POA: Diagnosis not present

## 2023-01-31 DIAGNOSIS — N2581 Secondary hyperparathyroidism of renal origin: Secondary | ICD-10-CM | POA: Diagnosis not present

## 2023-01-31 DIAGNOSIS — N186 End stage renal disease: Secondary | ICD-10-CM | POA: Diagnosis not present

## 2023-02-02 ENCOUNTER — Ambulatory Visit (INDEPENDENT_AMBULATORY_CARE_PROVIDER_SITE_OTHER): Payer: Medicare Other | Admitting: Family Medicine

## 2023-02-02 ENCOUNTER — Encounter: Payer: Self-pay | Admitting: Family Medicine

## 2023-02-02 VITALS — BP 112/58 | HR 68 | Ht 72.0 in | Wt 173.0 lb

## 2023-02-02 DIAGNOSIS — N186 End stage renal disease: Secondary | ICD-10-CM | POA: Diagnosis not present

## 2023-02-02 DIAGNOSIS — R42 Dizziness and giddiness: Secondary | ICD-10-CM

## 2023-02-02 DIAGNOSIS — D631 Anemia in chronic kidney disease: Secondary | ICD-10-CM | POA: Diagnosis not present

## 2023-02-02 DIAGNOSIS — D509 Iron deficiency anemia, unspecified: Secondary | ICD-10-CM | POA: Diagnosis not present

## 2023-02-02 DIAGNOSIS — Z716 Tobacco abuse counseling: Secondary | ICD-10-CM | POA: Diagnosis not present

## 2023-02-02 DIAGNOSIS — N2581 Secondary hyperparathyroidism of renal origin: Secondary | ICD-10-CM | POA: Diagnosis not present

## 2023-02-02 DIAGNOSIS — Z992 Dependence on renal dialysis: Secondary | ICD-10-CM | POA: Diagnosis not present

## 2023-02-02 DIAGNOSIS — J342 Deviated nasal septum: Secondary | ICD-10-CM | POA: Diagnosis not present

## 2023-02-02 MED ORDER — NICOTINE 21 MG/24HR TD PT24: 21.0000 mg | MEDICATED_PATCH | Freq: Every day | TRANSDERMAL | 0 refills | Status: DC

## 2023-02-02 NOTE — Progress Notes (Signed)
Date:  02/02/2023   Name:  Dwayne Reed   DOB:  1955/07/27   MRN:  [id number]   Chief Complaint: Nicotine Dependence and Dizziness (Started a few weeks ago. Dizziness when standing. )  Nicotine Dependence Presents for initial visit. Symptoms are negative for cravings, decreased concentration, fatigue, headache, insomnia, irritability and sore throat. Preferred tobacco types include cigarettes. His urge triggers include company of smokers and stress. Risk factors do not include contact with substance, decrease in perceived risk, disruptive behavior, drinking alcohol, drinking coffee, driving or meal time.Past treatments include nicotine patch (in drawar). Noe is ready to quit.  Dizziness This is a chronic problem. The current episode started more than 1 month ago. The problem occurs intermittently. Pertinent negatives include no abdominal pain, change in bowel habit, chest pain, congestion, coughing, diaphoresis, fatigue, fever, headaches, nausea, neck pain, numbness, sore throat, visual change or weakness. The symptoms are aggravated by bending (sit to stand/postdialysis). He has tried drinking for the symptoms. The treatment provided no relief.    Lab Results  Component Value Date   NA 146 (H) 03/09/2022   K 4.3 03/09/2022   CO2 25 03/09/2022   GLUCOSE 86 03/09/2022   BUN 39 (H) 03/09/2022   CREATININE 10.55 (H) 03/09/2022   CALCIUM 9.1 03/09/2022   EGFR 5 (L) 03/09/2022   GFRNONAA 6 (L) 02/26/2020   Lab Results  Component Value Date   CHOL 195 03/09/2022   HDL 47 03/09/2022   LDLCALC 129 (H) 03/09/2022   LDLDIRECT 152.0 03/27/2019   TRIG 105 03/09/2022   CHOLHDL 4.7 09/27/2019   No results found for: "TSH" No results found for: "HGBA1C" Lab Results  Component Value Date   WBC 4.3 03/09/2022   HGB 12.2 (L) 03/09/2022   HCT 36.6 (L) 03/09/2022   MCV 98 (H) 03/09/2022   PLT 130 (L) 03/09/2022   Lab Results  Component Value Date   ALT 14 03/09/2022   AST 10  03/09/2022   ALKPHOS 82 03/09/2022   BILITOT 0.4 03/09/2022   Lab Results  Component Value Date   VD25OH 34 09/27/2019     Review of Systems  Constitutional:  Negative for diaphoresis, fatigue, fever and irritability.  HENT:  Negative for congestion and sore throat.   Respiratory:  Negative for cough.   Cardiovascular:  Negative for chest pain.  Gastrointestinal:  Negative for abdominal pain, change in bowel habit and nausea.  Musculoskeletal:  Negative for neck pain.  Neurological:  Positive for dizziness and light-headedness. Negative for facial asymmetry, speech difficulty, weakness, numbness and headaches.  Psychiatric/Behavioral:  Negative for decreased concentration. The patient does not have insomnia.     Patient Active Problem List   Diagnosis Date Noted   Allergy, unspecified, initial encounter 11/18/2021   Anaphylactic shock, unspecified, initial encounter 11/18/2021   Abnormal ECG 08/19/2021   Hyperlipidemia, mixed 08/19/2021   Hypercalcemia 09/23/2020   Urinary tract infection with hematuria    Hydronephrosis with renal and ureteral calculus obstruction 02/24/2020   Breakdown of surgically created AV fistula, init (HCC) 10/23/2017   Decreased energy 10/23/2017   Mild protein-calorie malnutrition (HCC) 08/18/2017   Anemia in chronic kidney disease 07/29/2017   Coagulation defect, unspecified (HCC) 07/29/2017   Iron deficiency anemia, unspecified 07/29/2017   Secondary hyperparathyroidism of renal origin (HCC) 07/29/2017   ESRD on dialysis (HCC) 07/26/2017   SOB (shortness of breath) 07/25/2017   Recurrent nephrolithiasis 06/21/2017   Pneumonia 05/18/2017   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  COPD (chronic obstructive pulmonary disease) (HCC) 07/14/2016   History of renal cell cancer 07/12/2016   Olecranon bursitis 05/27/2016   Malignant tumor of prostate (HCC) 09/18/2015   Benign prostatic hyperplasia with lower urinary tract symptoms 08/05/2015    Pre-transplant evaluation for kidney transplant 08/05/2015   Acquired arteriovenous fistula (HCC) 04/30/2015   ESRD on hemodialysis (HCC) 02/04/2015   Hyperparathyroidism (HCC) 02/04/2015   Hyperphosphatemia 02/04/2015   Anemia of renal disease 02/04/2015   History of transfusion 12/13/2014   Elevated prostate specific antigen (PSA) 08/18/2012   Essential hypertension 08/18/2012   Gout 08/18/2012   Acquired cyst of kidney 04/13/2012    Allergies  Allergen Reactions   Cinacalcet Other (See Comments)    Other reaction(s): Unknown    Past Surgical History:  Procedure Laterality Date   CYSTOSCOPY/URETEROSCOPY/HOLMIUM LASER/STENT PLACEMENT N/A 02/25/2020   Procedure: CYSTOSCOPY/URETEROSCOPY/HOLMIUM LASER/STENT PLACEMENT;  Surgeon: Riki Altes, MD;  Location: [facility];  Service: Urology;  Laterality: N/A;   CYSTOSCOPY/URETEROSCOPY/HOLMIUM LASER/STENT PLACEMENT Right 03/09/2020   Procedure: CYSTOSCOPY/URETEROSCOPY/HOLMIUM LASER/STENT Exchange;  Surgeon: Riki Altes, MD;  Location: [facility];  Service: Urology;  Laterality: Right;   EXTRACORPOREAL SHOCK WAVE LITHOTRIPSY     LITHOTRIPSY     x 5   PROSTATE BIOPSY      Social History   Tobacco Use   Smoking status: Every Day    Current packs/day: 0.25    Average packs/day: 0.3 packs/day for 30.0 years (7.5 ttl pk-yrs)    Types: Cigarettes   Smokeless tobacco: Never  Vaping Use   Vaping status: Never Used  Substance Use Topics   Alcohol use: Not Currently    Comment: on holidays   Drug use: Never     Medication list has been reviewed and updated.  Current Meds  Medication Sig   AURYXIA 1 GM 210 MG(Fe) tablet Take 3 tablets (630 mg total) by mouth 3 (three) times daily.   B Complex-C-Zn-Folic Acid (DIALYVITE 800-ZINC 15) 0.8 MG TABS SMARTSIG:1 Tablet(s) By Mouth Every Evening       02/02/2023    1:31 PM 09/12/2022    1:41 PM 03/09/2022    8:50 AM 01/24/2022    1:17 PM  GAD 7 : Generalized Anxiety Score   Nervous, Anxious, on Edge 0 0 0 0  Control/stop worrying 0 0 0 0  Worry too much - different things 0 0 0 0  Trouble relaxing 0 0 0 0  Restless 0 0 0 0  Easily annoyed or irritable 0 0 0 1  Afraid - awful might happen 0 0 0 0  Total GAD 7 Score 0 0 0 1  Anxiety Difficulty Not difficult at all Not difficult at all Not difficult at all Not difficult at all       02/02/2023    1:31 PM 09/12/2022    1:41 PM 03/09/2022    8:50 AM  Depression screen PHQ 2/9  Decreased Interest 0 0 0  Down, Depressed, Hopeless 0 0 0  PHQ - 2 Score 0 0 0  Altered sleeping 0 0 0  Tired, decreased energy 0 0 0  Change in appetite 0 0 0  Feeling bad or failure about yourself  0 0 0  Trouble concentrating 0 0 0  Moving slowly or fidgety/restless 0 0 0  Suicidal thoughts 0 0 0  PHQ-9 Score 0 0 0  Difficult doing work/chores Not difficult at all Not difficult at all Not difficult at all    BP Readings from  Last 3 Encounters:  02/02/23 (!) 112/58  10/07/22 118/71  09/12/22 120/78    Physical Exam Vitals and nursing note reviewed. Exam conducted with a chaperone present.  HENT:     Head: Normocephalic.     Right Ear: External ear normal.     Left Ear: External ear normal.     Nose: Nose normal.  Eyes:     General: No scleral icterus.       Right eye: No discharge.        Left eye: No discharge.     Conjunctiva/sclera: Conjunctivae normal.     Pupils: Pupils are equal, round, and reactive to light.  Neck:     Thyroid: No thyromegaly.     Vascular: No JVD.     Trachea: No tracheal deviation.  Cardiovascular:     Rate and Rhythm: Normal rate and regular rhythm.     Heart sounds: Normal heart sounds. No murmur heard.    No friction rub. No gallop.  Pulmonary:     Effort: No respiratory distress.     Breath sounds: Normal breath sounds. No wheezing or rales.  Abdominal:     General: Bowel sounds are normal.     Palpations: Abdomen is soft. There is no mass.     Tenderness: There is no  abdominal tenderness. There is no guarding or rebound.  Musculoskeletal:        General: No tenderness. Normal range of motion.     Cervical back: Normal range of motion and neck supple.  Lymphadenopathy:     Cervical: No cervical adenopathy.  Skin:    General: Skin is warm.     Findings: No rash.  Neurological:     Mental Status: He is alert and oriented to person, place, and time.     Cranial Nerves: Cranial nerves 2-12 are intact. No cranial nerve deficit.     Sensory: Sensation is intact.     Motor: Motor function is intact.     Deep Tendon Reflexes: Reflexes are normal and symmetric.     Reflex Scores:      Bicep reflexes are 2+ on the right side and 2+ on the left side.      Patellar reflexes are 2+ on the right side and 2+ on the left side.    Wt Readings from Last 3 Encounters:  02/02/23 173 lb (78.5 kg)  09/12/22 174 lb (78.9 kg)  09/12/22 174 lb (78.9 kg)    BP (!) 112/58   Pulse 68   Ht 6' (1.829 m)   Wt 173 lb (78.5 kg)   BMI 23.46 kg/m   Assessment and Plan: 1. Deviated septum Chronic.  Controlled.  Stable.  Patient has had a mild injury for several years and would like to have it evaluated for ear nose and throat. - Ambulatory referral to ENT  2. Postural dizziness Chronic.  Episodic.  Only occurs after dialysis.  And is associated with change in position particularly sitting to standing.  Mucous membranes are dry chronic.  Episodic.  Only occurs after dialysis.  And is associated with change in position particularly sitting to standing.  Mucous membranes are dry patient probably has hypovolemia contributing to this and I have encouraged him to drink fluids neurologic exam is 100% normal at this time and we will recheck if this should progress our next step would be for the labs other than what is being for dialysis and referral.  3. Encounter for smoking cessation counseling Patient  has nicotine patches but they initially have not been working with [place] because  they have sat in the drawer I have instructed him that he needs to use them differently he would like a refill since they are out of date we will do so. - nicotine (NICODERM CQ) 21 mg/24hr patch; Place 1 patch (21 mg total) onto the skin daily.  Dispense: 28 patch; Refill: 0     Elizabeth Sauer, MD

## 2023-02-03 ENCOUNTER — Telehealth: Payer: Self-pay | Admitting: Family Medicine

## 2023-02-03 NOTE — Telephone Encounter (Signed)
Copied from CRM [id number]. Topic: General - Inquiry >> Feb 03, 2023  1:34 PM Patsy Lager T wrote: Reason for CRM: patient needs a letter emailed to him stating that he completed the smoking cessation program. Please f/u with patient

## 2023-02-03 NOTE — Telephone Encounter (Signed)
Called patient and let him know that we are not a program and cannot give a letter for this because he just seen Dr Yetta Barre yesterday and just started him on medication to quit smoking. Told him on Vm he can call back with more information if he would like but at this time we cannot give him a letter.  - Dwayne Reed

## 2023-02-04 DIAGNOSIS — Z992 Dependence on renal dialysis: Secondary | ICD-10-CM | POA: Diagnosis not present

## 2023-02-04 DIAGNOSIS — D631 Anemia in chronic kidney disease: Secondary | ICD-10-CM | POA: Diagnosis not present

## 2023-02-04 DIAGNOSIS — N186 End stage renal disease: Secondary | ICD-10-CM | POA: Diagnosis not present

## 2023-02-04 DIAGNOSIS — N2581 Secondary hyperparathyroidism of renal origin: Secondary | ICD-10-CM | POA: Diagnosis not present

## 2023-02-04 DIAGNOSIS — D509 Iron deficiency anemia, unspecified: Secondary | ICD-10-CM | POA: Diagnosis not present

## 2023-02-06 DIAGNOSIS — D509 Iron deficiency anemia, unspecified: Secondary | ICD-10-CM | POA: Diagnosis not present

## 2023-02-06 DIAGNOSIS — N2581 Secondary hyperparathyroidism of renal origin: Secondary | ICD-10-CM | POA: Diagnosis not present

## 2023-02-06 DIAGNOSIS — N186 End stage renal disease: Secondary | ICD-10-CM | POA: Diagnosis not present

## 2023-02-06 DIAGNOSIS — Z992 Dependence on renal dialysis: Secondary | ICD-10-CM | POA: Diagnosis not present

## 2023-02-06 DIAGNOSIS — D631 Anemia in chronic kidney disease: Secondary | ICD-10-CM | POA: Diagnosis not present

## 2023-02-08 DIAGNOSIS — Z992 Dependence on renal dialysis: Secondary | ICD-10-CM | POA: Diagnosis not present

## 2023-02-08 DIAGNOSIS — N186 End stage renal disease: Secondary | ICD-10-CM | POA: Diagnosis not present

## 2023-02-08 DIAGNOSIS — D631 Anemia in chronic kidney disease: Secondary | ICD-10-CM | POA: Diagnosis not present

## 2023-02-08 DIAGNOSIS — N2581 Secondary hyperparathyroidism of renal origin: Secondary | ICD-10-CM | POA: Diagnosis not present

## 2023-02-08 DIAGNOSIS — D509 Iron deficiency anemia, unspecified: Secondary | ICD-10-CM | POA: Diagnosis not present

## 2023-02-11 DIAGNOSIS — N2581 Secondary hyperparathyroidism of renal origin: Secondary | ICD-10-CM | POA: Diagnosis not present

## 2023-02-11 DIAGNOSIS — N186 End stage renal disease: Secondary | ICD-10-CM | POA: Diagnosis not present

## 2023-02-11 DIAGNOSIS — D509 Iron deficiency anemia, unspecified: Secondary | ICD-10-CM | POA: Diagnosis not present

## 2023-02-11 DIAGNOSIS — D631 Anemia in chronic kidney disease: Secondary | ICD-10-CM | POA: Diagnosis not present

## 2023-02-11 DIAGNOSIS — Z992 Dependence on renal dialysis: Secondary | ICD-10-CM | POA: Diagnosis not present

## 2023-02-12 DIAGNOSIS — Z992 Dependence on renal dialysis: Secondary | ICD-10-CM | POA: Diagnosis not present

## 2023-02-12 DIAGNOSIS — N186 End stage renal disease: Secondary | ICD-10-CM | POA: Diagnosis not present

## 2023-02-12 DIAGNOSIS — I129 Hypertensive chronic kidney disease with stage 1 through stage 4 chronic kidney disease, or unspecified chronic kidney disease: Secondary | ICD-10-CM | POA: Diagnosis not present

## 2023-02-14 DIAGNOSIS — Z992 Dependence on renal dialysis: Secondary | ICD-10-CM | POA: Diagnosis not present

## 2023-02-14 DIAGNOSIS — N2581 Secondary hyperparathyroidism of renal origin: Secondary | ICD-10-CM | POA: Diagnosis not present

## 2023-02-14 DIAGNOSIS — D631 Anemia in chronic kidney disease: Secondary | ICD-10-CM | POA: Diagnosis not present

## 2023-02-14 DIAGNOSIS — N186 End stage renal disease: Secondary | ICD-10-CM | POA: Diagnosis not present

## 2023-02-14 DIAGNOSIS — D509 Iron deficiency anemia, unspecified: Secondary | ICD-10-CM | POA: Diagnosis not present

## 2023-02-16 DIAGNOSIS — Z992 Dependence on renal dialysis: Secondary | ICD-10-CM | POA: Diagnosis not present

## 2023-02-16 DIAGNOSIS — N186 End stage renal disease: Secondary | ICD-10-CM | POA: Diagnosis not present

## 2023-02-16 DIAGNOSIS — N2581 Secondary hyperparathyroidism of renal origin: Secondary | ICD-10-CM | POA: Diagnosis not present

## 2023-02-16 DIAGNOSIS — D509 Iron deficiency anemia, unspecified: Secondary | ICD-10-CM | POA: Diagnosis not present

## 2023-02-16 DIAGNOSIS — D631 Anemia in chronic kidney disease: Secondary | ICD-10-CM | POA: Diagnosis not present

## 2023-02-18 DIAGNOSIS — Z992 Dependence on renal dialysis: Secondary | ICD-10-CM | POA: Diagnosis not present

## 2023-02-18 DIAGNOSIS — D631 Anemia in chronic kidney disease: Secondary | ICD-10-CM | POA: Diagnosis not present

## 2023-02-18 DIAGNOSIS — N186 End stage renal disease: Secondary | ICD-10-CM | POA: Diagnosis not present

## 2023-02-18 DIAGNOSIS — N2581 Secondary hyperparathyroidism of renal origin: Secondary | ICD-10-CM | POA: Diagnosis not present

## 2023-02-18 DIAGNOSIS — D509 Iron deficiency anemia, unspecified: Secondary | ICD-10-CM | POA: Diagnosis not present

## 2023-02-21 DIAGNOSIS — D509 Iron deficiency anemia, unspecified: Secondary | ICD-10-CM | POA: Diagnosis not present

## 2023-02-21 DIAGNOSIS — N2581 Secondary hyperparathyroidism of renal origin: Secondary | ICD-10-CM | POA: Diagnosis not present

## 2023-02-21 DIAGNOSIS — Z992 Dependence on renal dialysis: Secondary | ICD-10-CM | POA: Diagnosis not present

## 2023-02-21 DIAGNOSIS — N186 End stage renal disease: Secondary | ICD-10-CM | POA: Diagnosis not present

## 2023-02-21 DIAGNOSIS — D631 Anemia in chronic kidney disease: Secondary | ICD-10-CM | POA: Diagnosis not present

## 2023-02-23 DIAGNOSIS — N186 End stage renal disease: Secondary | ICD-10-CM | POA: Diagnosis not present

## 2023-02-23 DIAGNOSIS — N2581 Secondary hyperparathyroidism of renal origin: Secondary | ICD-10-CM | POA: Diagnosis not present

## 2023-02-23 DIAGNOSIS — D509 Iron deficiency anemia, unspecified: Secondary | ICD-10-CM | POA: Diagnosis not present

## 2023-02-23 DIAGNOSIS — Z992 Dependence on renal dialysis: Secondary | ICD-10-CM | POA: Diagnosis not present

## 2023-02-23 DIAGNOSIS — D631 Anemia in chronic kidney disease: Secondary | ICD-10-CM | POA: Diagnosis not present

## 2023-02-25 DIAGNOSIS — Z992 Dependence on renal dialysis: Secondary | ICD-10-CM | POA: Diagnosis not present

## 2023-02-25 DIAGNOSIS — D509 Iron deficiency anemia, unspecified: Secondary | ICD-10-CM | POA: Diagnosis not present

## 2023-02-25 DIAGNOSIS — D631 Anemia in chronic kidney disease: Secondary | ICD-10-CM | POA: Diagnosis not present

## 2023-02-25 DIAGNOSIS — N186 End stage renal disease: Secondary | ICD-10-CM | POA: Diagnosis not present

## 2023-02-25 DIAGNOSIS — N2581 Secondary hyperparathyroidism of renal origin: Secondary | ICD-10-CM | POA: Diagnosis not present

## 2023-02-28 DIAGNOSIS — N2581 Secondary hyperparathyroidism of renal origin: Secondary | ICD-10-CM | POA: Diagnosis not present

## 2023-02-28 DIAGNOSIS — Z992 Dependence on renal dialysis: Secondary | ICD-10-CM | POA: Diagnosis not present

## 2023-02-28 DIAGNOSIS — D509 Iron deficiency anemia, unspecified: Secondary | ICD-10-CM | POA: Diagnosis not present

## 2023-02-28 DIAGNOSIS — N186 End stage renal disease: Secondary | ICD-10-CM | POA: Diagnosis not present

## 2023-02-28 DIAGNOSIS — D631 Anemia in chronic kidney disease: Secondary | ICD-10-CM | POA: Diagnosis not present

## 2023-03-02 DIAGNOSIS — N186 End stage renal disease: Secondary | ICD-10-CM | POA: Diagnosis not present

## 2023-03-02 DIAGNOSIS — N2581 Secondary hyperparathyroidism of renal origin: Secondary | ICD-10-CM | POA: Diagnosis not present

## 2023-03-02 DIAGNOSIS — D509 Iron deficiency anemia, unspecified: Secondary | ICD-10-CM | POA: Diagnosis not present

## 2023-03-02 DIAGNOSIS — Z992 Dependence on renal dialysis: Secondary | ICD-10-CM | POA: Diagnosis not present

## 2023-03-02 DIAGNOSIS — D631 Anemia in chronic kidney disease: Secondary | ICD-10-CM | POA: Diagnosis not present

## 2023-03-04 DIAGNOSIS — Z992 Dependence on renal dialysis: Secondary | ICD-10-CM | POA: Diagnosis not present

## 2023-03-04 DIAGNOSIS — N2581 Secondary hyperparathyroidism of renal origin: Secondary | ICD-10-CM | POA: Diagnosis not present

## 2023-03-04 DIAGNOSIS — D509 Iron deficiency anemia, unspecified: Secondary | ICD-10-CM | POA: Diagnosis not present

## 2023-03-04 DIAGNOSIS — D631 Anemia in chronic kidney disease: Secondary | ICD-10-CM | POA: Diagnosis not present

## 2023-03-04 DIAGNOSIS — N186 End stage renal disease: Secondary | ICD-10-CM | POA: Diagnosis not present

## 2023-03-06 DIAGNOSIS — Z992 Dependence on renal dialysis: Secondary | ICD-10-CM | POA: Diagnosis not present

## 2023-03-06 DIAGNOSIS — N2581 Secondary hyperparathyroidism of renal origin: Secondary | ICD-10-CM | POA: Diagnosis not present

## 2023-03-06 DIAGNOSIS — D509 Iron deficiency anemia, unspecified: Secondary | ICD-10-CM | POA: Diagnosis not present

## 2023-03-06 DIAGNOSIS — D631 Anemia in chronic kidney disease: Secondary | ICD-10-CM | POA: Diagnosis not present

## 2023-03-06 DIAGNOSIS — N186 End stage renal disease: Secondary | ICD-10-CM | POA: Diagnosis not present

## 2023-03-09 DIAGNOSIS — D631 Anemia in chronic kidney disease: Secondary | ICD-10-CM | POA: Diagnosis not present

## 2023-03-09 DIAGNOSIS — D509 Iron deficiency anemia, unspecified: Secondary | ICD-10-CM | POA: Diagnosis not present

## 2023-03-09 DIAGNOSIS — N2581 Secondary hyperparathyroidism of renal origin: Secondary | ICD-10-CM | POA: Diagnosis not present

## 2023-03-09 DIAGNOSIS — N186 End stage renal disease: Secondary | ICD-10-CM | POA: Diagnosis not present

## 2023-03-09 DIAGNOSIS — Z992 Dependence on renal dialysis: Secondary | ICD-10-CM | POA: Diagnosis not present

## 2023-03-11 DIAGNOSIS — D509 Iron deficiency anemia, unspecified: Secondary | ICD-10-CM | POA: Diagnosis not present

## 2023-03-11 DIAGNOSIS — N186 End stage renal disease: Secondary | ICD-10-CM | POA: Diagnosis not present

## 2023-03-11 DIAGNOSIS — D631 Anemia in chronic kidney disease: Secondary | ICD-10-CM | POA: Diagnosis not present

## 2023-03-11 DIAGNOSIS — N2581 Secondary hyperparathyroidism of renal origin: Secondary | ICD-10-CM | POA: Diagnosis not present

## 2023-03-11 DIAGNOSIS — Z992 Dependence on renal dialysis: Secondary | ICD-10-CM | POA: Diagnosis not present

## 2023-03-13 DIAGNOSIS — D631 Anemia in chronic kidney disease: Secondary | ICD-10-CM | POA: Diagnosis not present

## 2023-03-13 DIAGNOSIS — N186 End stage renal disease: Secondary | ICD-10-CM | POA: Diagnosis not present

## 2023-03-13 DIAGNOSIS — Z992 Dependence on renal dialysis: Secondary | ICD-10-CM | POA: Diagnosis not present

## 2023-03-13 DIAGNOSIS — N2581 Secondary hyperparathyroidism of renal origin: Secondary | ICD-10-CM | POA: Diagnosis not present

## 2023-03-13 DIAGNOSIS — D509 Iron deficiency anemia, unspecified: Secondary | ICD-10-CM | POA: Diagnosis not present

## 2023-03-15 DIAGNOSIS — N186 End stage renal disease: Secondary | ICD-10-CM | POA: Diagnosis not present

## 2023-03-15 DIAGNOSIS — I129 Hypertensive chronic kidney disease with stage 1 through stage 4 chronic kidney disease, or unspecified chronic kidney disease: Secondary | ICD-10-CM | POA: Diagnosis not present

## 2023-03-15 DIAGNOSIS — Z992 Dependence on renal dialysis: Secondary | ICD-10-CM | POA: Diagnosis not present

## 2023-03-18 ENCOUNTER — Encounter: Payer: Self-pay | Admitting: Emergency Medicine

## 2023-03-18 ENCOUNTER — Other Ambulatory Visit: Payer: Self-pay

## 2023-03-18 DIAGNOSIS — K921 Melena: Secondary | ICD-10-CM | POA: Diagnosis not present

## 2023-03-18 DIAGNOSIS — E782 Mixed hyperlipidemia: Secondary | ICD-10-CM | POA: Diagnosis present

## 2023-03-18 DIAGNOSIS — K5731 Diverticulosis of large intestine without perforation or abscess with bleeding: Secondary | ICD-10-CM | POA: Diagnosis not present

## 2023-03-18 DIAGNOSIS — R351 Nocturia: Secondary | ICD-10-CM | POA: Diagnosis present

## 2023-03-18 DIAGNOSIS — J449 Chronic obstructive pulmonary disease, unspecified: Secondary | ICD-10-CM | POA: Diagnosis present

## 2023-03-18 DIAGNOSIS — Z888 Allergy status to other drugs, medicaments and biological substances status: Secondary | ICD-10-CM

## 2023-03-18 DIAGNOSIS — N2581 Secondary hyperparathyroidism of renal origin: Secondary | ICD-10-CM | POA: Diagnosis present

## 2023-03-18 DIAGNOSIS — Z8249 Family history of ischemic heart disease and other diseases of the circulatory system: Secondary | ICD-10-CM

## 2023-03-18 DIAGNOSIS — G2581 Restless legs syndrome: Secondary | ICD-10-CM | POA: Diagnosis present

## 2023-03-18 DIAGNOSIS — Z79899 Other long term (current) drug therapy: Secondary | ICD-10-CM

## 2023-03-18 DIAGNOSIS — M109 Gout, unspecified: Secondary | ICD-10-CM | POA: Diagnosis present

## 2023-03-18 DIAGNOSIS — I509 Heart failure, unspecified: Secondary | ICD-10-CM | POA: Diagnosis present

## 2023-03-18 DIAGNOSIS — K625 Hemorrhage of anus and rectum: Secondary | ICD-10-CM | POA: Diagnosis not present

## 2023-03-18 DIAGNOSIS — D631 Anemia in chronic kidney disease: Secondary | ICD-10-CM | POA: Diagnosis present

## 2023-03-18 DIAGNOSIS — F17210 Nicotine dependence, cigarettes, uncomplicated: Secondary | ICD-10-CM | POA: Diagnosis present

## 2023-03-18 DIAGNOSIS — Z85528 Personal history of other malignant neoplasm of kidney: Secondary | ICD-10-CM

## 2023-03-18 DIAGNOSIS — N401 Enlarged prostate with lower urinary tract symptoms: Secondary | ICD-10-CM | POA: Diagnosis present

## 2023-03-18 DIAGNOSIS — Z87442 Personal history of urinary calculi: Secondary | ICD-10-CM

## 2023-03-18 DIAGNOSIS — D124 Benign neoplasm of descending colon: Secondary | ICD-10-CM | POA: Diagnosis present

## 2023-03-18 DIAGNOSIS — K641 Second degree hemorrhoids: Secondary | ICD-10-CM | POA: Diagnosis present

## 2023-03-18 DIAGNOSIS — N186 End stage renal disease: Secondary | ICD-10-CM | POA: Diagnosis present

## 2023-03-18 DIAGNOSIS — Z8546 Personal history of malignant neoplasm of prostate: Secondary | ICD-10-CM

## 2023-03-18 DIAGNOSIS — I132 Hypertensive heart and chronic kidney disease with heart failure and with stage 5 chronic kidney disease, or end stage renal disease: Secondary | ICD-10-CM | POA: Diagnosis present

## 2023-03-18 DIAGNOSIS — Z992 Dependence on renal dialysis: Secondary | ICD-10-CM

## 2023-03-18 LAB — COMPREHENSIVE METABOLIC PANEL
ALT: 17 U/L (ref 0–44)
AST: 16 U/L (ref 15–41)
Albumin: 3.7 g/dL (ref 3.5–5.0)
Alkaline Phosphatase: 72 U/L (ref 38–126)
Anion gap: 14 (ref 5–15)
BUN: 26 mg/dL — ABNORMAL HIGH (ref 8–23)
CO2: 29 mmol/L (ref 22–32)
Calcium: 9.3 mg/dL (ref 8.9–10.3)
Chloride: 93 mmol/L — ABNORMAL LOW (ref 98–111)
Creatinine, Ser: 7.1 mg/dL — ABNORMAL HIGH (ref 0.61–1.24)
GFR, Estimated: 8 mL/min — ABNORMAL LOW (ref >60)
Glucose, Bld: 99 mg/dL (ref 70–99)
Potassium: 4.2 mmol/L (ref 3.5–5.1)
Sodium: 136 mmol/L (ref 135–145)
Total Bilirubin: 0.5 mg/dL (ref 0.0–1.2)
Total Protein: 6.8 g/dL (ref 6.5–8.1)

## 2023-03-18 LAB — CBC
HCT: 33.4 % — ABNORMAL LOW (ref 39.0–52.0)
Hemoglobin: 10.7 g/dL — ABNORMAL LOW (ref 13.0–17.0)
MCH: 33.1 pg (ref 26.0–34.0)
MCHC: 32 g/dL (ref 30.0–36.0)
MCV: 103.4 fL — ABNORMAL HIGH (ref 80.0–100.0)
Platelets: 120 10*3/uL — ABNORMAL LOW (ref 150–400)
RBC: 3.23 MIL/uL — ABNORMAL LOW (ref 4.22–5.81)
RDW: 15.6 % — ABNORMAL HIGH (ref 11.5–15.5)
WBC: 4.1 10*3/uL (ref 4.0–10.5)
nRBC: 0 % (ref 0.0–0.2)

## 2023-03-18 LAB — TYPE AND SCREEN
ABO/RH(D): O POS
Antibody Screen: NEGATIVE

## 2023-03-18 NOTE — ED Triage Notes (Signed)
 Pt reports concern today after noticed bright red blood on his trousers and when he went to wipe. Denies any blood BM. No abdominal pain. Per note pt is a [facility] dialysis pt, last dialysis today complete treatment.

## 2023-03-19 ENCOUNTER — Inpatient Hospital Stay
Admission: EM | Admit: 2023-03-19 | Discharge: 2023-03-22 | DRG: 377 | Disposition: A | Discharge status: Home / Self Care | Payer: Medicare Other | Attending: Obstetrics and Gynecology | Admitting: Obstetrics and Gynecology

## 2023-03-19 DIAGNOSIS — M109 Gout, unspecified: Secondary | ICD-10-CM | POA: Diagnosis present

## 2023-03-19 DIAGNOSIS — Z8546 Personal history of malignant neoplasm of prostate: Secondary | ICD-10-CM | POA: Diagnosis not present

## 2023-03-19 DIAGNOSIS — K641 Second degree hemorrhoids: Secondary | ICD-10-CM | POA: Diagnosis not present

## 2023-03-19 DIAGNOSIS — R351 Nocturia: Secondary | ICD-10-CM | POA: Diagnosis present

## 2023-03-19 DIAGNOSIS — D631 Anemia in chronic kidney disease: Secondary | ICD-10-CM | POA: Diagnosis present

## 2023-03-19 DIAGNOSIS — N401 Enlarged prostate with lower urinary tract symptoms: Secondary | ICD-10-CM | POA: Diagnosis not present

## 2023-03-19 DIAGNOSIS — Z87442 Personal history of urinary calculi: Secondary | ICD-10-CM | POA: Diagnosis not present

## 2023-03-19 DIAGNOSIS — K6389 Other specified diseases of intestine: Secondary | ICD-10-CM | POA: Diagnosis not present

## 2023-03-19 DIAGNOSIS — D123 Benign neoplasm of transverse colon: Secondary | ICD-10-CM | POA: Diagnosis not present

## 2023-03-19 DIAGNOSIS — K921 Melena: Secondary | ICD-10-CM

## 2023-03-19 DIAGNOSIS — Z992 Dependence on renal dialysis: Secondary | ICD-10-CM | POA: Diagnosis not present

## 2023-03-19 DIAGNOSIS — Z85528 Personal history of other malignant neoplasm of kidney: Secondary | ICD-10-CM

## 2023-03-19 DIAGNOSIS — N2581 Secondary hyperparathyroidism of renal origin: Secondary | ICD-10-CM | POA: Diagnosis not present

## 2023-03-19 DIAGNOSIS — K625 Hemorrhage of anus and rectum: Principal | ICD-10-CM

## 2023-03-19 DIAGNOSIS — I10 Essential (primary) hypertension: Secondary | ICD-10-CM | POA: Diagnosis present

## 2023-03-19 DIAGNOSIS — E782 Mixed hyperlipidemia: Secondary | ICD-10-CM | POA: Diagnosis present

## 2023-03-19 DIAGNOSIS — N186 End stage renal disease: Secondary | ICD-10-CM | POA: Diagnosis not present

## 2023-03-19 DIAGNOSIS — K635 Polyp of colon: Secondary | ICD-10-CM | POA: Diagnosis not present

## 2023-03-19 DIAGNOSIS — K649 Unspecified hemorrhoids: Secondary | ICD-10-CM | POA: Diagnosis not present

## 2023-03-19 DIAGNOSIS — Z8249 Family history of ischemic heart disease and other diseases of the circulatory system: Secondary | ICD-10-CM | POA: Diagnosis not present

## 2023-03-19 DIAGNOSIS — N3943 Post-void dribbling: Secondary | ICD-10-CM | POA: Diagnosis not present

## 2023-03-19 DIAGNOSIS — Z888 Allergy status to other drugs, medicaments and biological substances status: Secondary | ICD-10-CM | POA: Diagnosis not present

## 2023-03-19 DIAGNOSIS — D649 Anemia, unspecified: Secondary | ICD-10-CM | POA: Diagnosis not present

## 2023-03-19 DIAGNOSIS — I509 Heart failure, unspecified: Secondary | ICD-10-CM | POA: Diagnosis not present

## 2023-03-19 DIAGNOSIS — F17210 Nicotine dependence, cigarettes, uncomplicated: Secondary | ICD-10-CM | POA: Diagnosis not present

## 2023-03-19 DIAGNOSIS — K922 Gastrointestinal hemorrhage, unspecified: Secondary | ICD-10-CM | POA: Diagnosis not present

## 2023-03-19 DIAGNOSIS — K5730 Diverticulosis of large intestine without perforation or abscess without bleeding: Secondary | ICD-10-CM | POA: Diagnosis not present

## 2023-03-19 DIAGNOSIS — G2581 Restless legs syndrome: Secondary | ICD-10-CM | POA: Diagnosis present

## 2023-03-19 DIAGNOSIS — D124 Benign neoplasm of descending colon: Secondary | ICD-10-CM | POA: Diagnosis not present

## 2023-03-19 DIAGNOSIS — J449 Chronic obstructive pulmonary disease, unspecified: Secondary | ICD-10-CM | POA: Diagnosis present

## 2023-03-19 DIAGNOSIS — I132 Hypertensive heart and chronic kidney disease with heart failure and with stage 5 chronic kidney disease, or end stage renal disease: Secondary | ICD-10-CM | POA: Diagnosis not present

## 2023-03-19 DIAGNOSIS — Z79899 Other long term (current) drug therapy: Secondary | ICD-10-CM | POA: Diagnosis not present

## 2023-03-19 DIAGNOSIS — C61 Malignant neoplasm of prostate: Secondary | ICD-10-CM | POA: Diagnosis present

## 2023-03-19 DIAGNOSIS — K5731 Diverticulosis of large intestine without perforation or abscess with bleeding: Secondary | ICD-10-CM | POA: Diagnosis present

## 2023-03-19 LAB — PROTIME-INR
INR: 0.9 (ref 0.8–1.2)
Prothrombin Time: 12.5 s (ref 11.4–15.2)

## 2023-03-19 LAB — BASIC METABOLIC PANEL WITH GFR
Anion gap: 14 (ref 5–15)
BUN: 32 mg/dL — ABNORMAL HIGH (ref 8–23)
CO2: 28 mmol/L (ref 22–32)
Calcium: 8.7 mg/dL — ABNORMAL LOW (ref 8.9–10.3)
Chloride: 95 mmol/L — ABNORMAL LOW (ref 98–111)
Creatinine, Ser: 7.96 mg/dL — ABNORMAL HIGH (ref 0.61–1.24)
GFR, Estimated: 7 mL/min — ABNORMAL LOW
Glucose, Bld: 95 mg/dL (ref 70–99)
Potassium: 4 mmol/L (ref 3.5–5.1)
Sodium: 137 mmol/L (ref 135–145)

## 2023-03-19 LAB — CBC
HCT: 29.7 % — ABNORMAL LOW (ref 39.0–52.0)
Hemoglobin: 9.9 g/dL — ABNORMAL LOW (ref 13.0–17.0)
MCH: 33.3 pg (ref 26.0–34.0)
MCHC: 33.3 g/dL (ref 30.0–36.0)
MCV: 100 fL (ref 80.0–100.0)
Platelets: 132 10*3/uL — ABNORMAL LOW (ref 150–400)
RBC: 2.97 MIL/uL — ABNORMAL LOW (ref 4.22–5.81)
RDW: 15.9 % — ABNORMAL HIGH (ref 11.5–15.5)
WBC: 3.6 10*3/uL — ABNORMAL LOW (ref 4.0–10.5)
nRBC: 0 % (ref 0.0–0.2)

## 2023-03-19 MED ORDER — PANTOPRAZOLE SODIUM 40 MG IV SOLR
40.0000 mg | Freq: Two times a day (BID) | INTRAVENOUS | Status: DC
  Administered 2023-03-19 – 2023-03-22 (×7): 40 mg via INTRAVENOUS
  Filled 2023-03-19 (×7): qty 10

## 2023-03-19 MED ORDER — NICOTINE 7 MG/24HR TD PT24
7.0000 mg | MEDICATED_PATCH | Freq: Every day | TRANSDERMAL | Status: DC
  Administered 2023-03-19 – 2023-03-22 (×3): 7 mg via TRANSDERMAL
  Filled 2023-03-19 (×5): qty 1

## 2023-03-19 MED ORDER — ACETAMINOPHEN 325 MG PO TABS: 650.0000 mg | ORAL_TABLET | Freq: Four times a day (QID) | ORAL | Status: DC | PRN

## 2023-03-19 MED ORDER — TAMSULOSIN HCL 0.4 MG PO CAPS
0.4000 mg | ORAL_CAPSULE | Freq: Every day | ORAL | Status: DC
  Administered 2023-03-19 – 2023-03-20 (×2): 0.4 mg via ORAL
  Filled 2023-03-19 (×2): qty 1

## 2023-03-19 MED ORDER — PANTOPRAZOLE SODIUM 40 MG IV SOLR
40.0000 mg | INTRAVENOUS | Status: AC
  Administered 2023-03-19 (×2): 40 mg via INTRAVENOUS
  Filled 2023-03-19: qty 10

## 2023-03-19 MED ORDER — ACETAMINOPHEN 650 MG RE SUPP: 650.0000 mg | Freq: Four times a day (QID) | RECTAL | Status: DC | PRN

## 2023-03-19 NOTE — Progress Notes (Signed)
 Referring Provider: No ref. provider found Primary Care Physician:  Joshua Cathryne BROCKS, MD Primary Nephrologist:  Dr.   [place] for Consultation: ESRD  HPI: 68 year old male with history of hypertension, hyperlipidemia, COPD, BPH, gout, history of prostate cancer, hypercalcemia, secondary hyperparathyroidism, end-stage renal disease on dialysis on a Tuesday Thursdays and Saturday schedule.  After he went home from dialysis yesterday he started to have bowel movements with dark bright red blood.  In the ER patient was found to have melanotic stools and was guaiac positive.  He denies any chest pain, shortness of breath, headache, fever or chills.  Past Medical History:  Diagnosis Date   Anemia    low iron    CHF (congestive heart failure) (HCC)    pt denies   COPD (chronic obstructive pulmonary disease) (HCC)    Dialysis patient (HCC)    History of kidney stones    Hypertension    Kidney stones    Pneumonia    Renal disorder    ESRD on HD (M,W,F)    Past Surgical History:  Procedure Laterality Date   CYSTOSCOPY/URETEROSCOPY/HOLMIUM LASER/STENT PLACEMENT N/A 02/25/2020   Procedure: CYSTOSCOPY/URETEROSCOPY/HOLMIUM LASER/STENT PLACEMENT;  Surgeon: Twylla Glendia BROCKS, MD;  Location: [facility];  Service: Urology;  Laterality: N/A;   CYSTOSCOPY/URETEROSCOPY/HOLMIUM LASER/STENT PLACEMENT Right 03/09/2020   Procedure: CYSTOSCOPY/URETEROSCOPY/HOLMIUM LASER/STENT Exchange;  Surgeon: Twylla Glendia BROCKS, MD;  Location: [facility];  Service: Urology;  Laterality: Right;   EXTRACORPOREAL SHOCK WAVE LITHOTRIPSY     LITHOTRIPSY     x 5   PROSTATE BIOPSY      Prior to Admission medications   Medication Sig Start Date End Date Taking? Authorizing Provider  AURYXIA  1 GM 210 MG(Fe) tablet Take 3 tablets (630 mg total) by mouth 3 (three) times daily. 03/19/19  Yes Cleatus Arlyss RAMAN, MD  B Complex-C-Zn-Folic Acid  (DIALYVITE 800-ZINC  15) 0.8 MG TABS SMARTSIG:1 Tablet(s) By Mouth Every Evening   Yes [provider]  hydrALAZINE  (APRESOLINE ) 25 MG tablet Take 25 mg by mouth in the morning and at bedtime. Patient not taking: Reported on 03/19/2023 02/18/23   [provider]  Methoxy PEG-Epoetin  Beta (MIRCERA IJ) every 14 (fourteen) days. Patient not taking: Reported on 03/19/2023 03/18/23 03/16/24  [provider]  nicotine  (NICODERM CQ ) 21 mg/24hr patch Place 1 patch (21 mg total) onto the skin daily. Patient not taking: Reported on 03/19/2023 02/02/23   Joshua Cathryne BROCKS, MD  rOPINIRole  (REQUIP ) 0.25 MG tablet Take 0.25 mg by mouth at bedtime. Patient not taking: Reported on 03/19/2023 02/21/23   [provider]    Current Facility-Administered Medications  Medication Dose Route Frequency Provider Last Rate Last Admin   acetaminophen  (TYLENOL ) tablet 650 mg  650 mg Oral Q6H PRN Crosley, Debby, MD       Or   acetaminophen  (TYLENOL ) suppository 650 mg  650 mg Rectal Q6H PRN Crosley, Debby, MD       nicotine  (NICODERM CQ  - dosed in mg/24 hr) patch 7 mg  7 mg Transdermal Daily Crosley, Debby, MD   7 mg at 03/19/23 9662   pantoprazole  (PROTONIX ) injection 40 mg  40 mg Intravenous Q12H Ward, Kristen N, DO       tamsulosin  (FLOMAX ) capsule 0.4 mg  0.4 mg Oral QPC supper Laveda Roosevelt, MD       Current Outpatient Medications  Medication Sig Dispense Refill   AURYXIA  1 GM 210 MG(Fe) tablet Take 3 tablets (630 mg total) by mouth 3 (three) times daily.  B Complex-C-Zn-Folic Acid  (DIALYVITE 800-ZINC  15) 0.8 MG TABS SMARTSIG:1 Tablet(s) By Mouth Every Evening     hydrALAZINE  (APRESOLINE ) 25 MG tablet Take 25 mg by mouth in the morning and at bedtime. (Patient not taking: Reported on 03/19/2023)     Methoxy PEG-Epoetin  Beta (MIRCERA IJ) every 14 (fourteen) days. (Patient not taking: Reported on 03/19/2023)     nicotine  (NICODERM CQ ) 21 mg/24hr patch Place 1 patch (21 mg total) onto the skin daily. (Patient not taking: Reported on 03/19/2023) 28 patch 0   rOPINIRole  (REQUIP ) 0.25 MG tablet  Take 0.25 mg by mouth at bedtime. (Patient not taking: Reported on 03/19/2023)      Allergies as of 03/18/2023 - Review Complete 03/18/2023  Allergen Reaction Noted   Cinacalcet  Other (See Comments) 03/18/2020    Family History  Problem Relation Age of Onset   Hypertension Mother    Heart disease Mother    Sarcoidosis Sister    Cancer Maternal Grandmother        type unknown    Social History   Socioeconomic History   Marital status: Married    Spouse name: Not on file   Number of children: 1   Years of education: Not on file   Highest education level: Not on file  Occupational History   Occupation: admin support speacialist  Tobacco Use   Smoking status: Every Day    Current packs/day: 0.25    Average packs/day: 0.3 packs/day for 30.0 years (7.5 ttl pk-yrs)    Types: Cigarettes   Smokeless tobacco: Never  Vaping Use   Vaping status: Never Used  Substance and Sexual Activity   Alcohol use: Not Currently    Comment: on holidays   Drug use: Never   Sexual activity: Not Currently  Other Topics Concern   Not on file  Social History Narrative   Not on file   Social Drivers of Health   Financial Resource Strain: Low Risk  (09/12/2022)   Overall Financial Resource Strain (CARDIA)    Difficulty of Paying Living Expenses: Not hard at all  Food Insecurity: No Food Insecurity (09/12/2022)   Hunger Vital Sign    Worried About Running Out of Food in the Last Year: Never true    Ran Out of Food in the Last Year: Never true  Transportation Needs: No Transportation Needs (09/12/2022)   PRAPARE - Administrator, Civil Service (Medical): No    Lack of Transportation (Non-Medical): No  Physical Activity: Inactive (09/12/2022)   Exercise Vital Sign    Days of Exercise per Week: 0 days    Minutes of Exercise per Session: 0 min  Stress: No Stress Concern Present (09/12/2022)   [employer] of Occupational Health - Occupational Stress Questionnaire    Feeling of Stress :  Not at all  Social Connections: Not on file  Intimate Partner Violence: Not At Risk (09/12/2022)   Humiliation, Afraid, Rape, and Kick questionnaire    Fear of Current or Ex-Partner: No    Emotionally Abused: No    Physically Abused: No    Sexually Abused: No    Physical Exam: Vital signs in last 24 hours: Temp:  [98 F (36.7 C)-98.5 F (36.9 C)] 98 F (36.7 C) (01/05 0951) Pulse Rate:  [81-90] 90 (01/05 0951) Resp:  [14-18] 18 (01/05 0951) BP: (118-140)/(72-78) 140/78 (01/05 0951) SpO2:  [99 %-100 %] 100 % (01/05 0951) Weight:  [78.5 kg] 78.5 kg (01/04 2132)   General:   Alert,  Well-developed,  well-nourished, pleasant and cooperative in NAD Head:  Normocephalic and atraumatic. Eyes:  Sclera clear, no icterus.   Conjunctiva pink. Ears:  Normal auditory acuity. Nose:  No deformity, discharge,  or lesions. Lungs:  Clear throughout to auscultation.   No wheezes, crackles, or rhonchi. No acute distress. Heart:  Regular rate and rhythm; no murmurs, clicks, rubs,  or gallops. Abdomen:  Soft, nontender and nondistended. No masses, hepatosplenomegaly or hernias noted. Normal bowel sounds, without guarding, and without rebound.   Extremities:  Without clubbing or edema.  Intake/Output from previous day: No intake/output data recorded. Intake/Output this shift: No intake/output data recorded.  Lab Results: Recent Labs    03/18/23 2137 03/19/23 0828  WBC 4.1 3.6*  HGB 10.7* 9.9*  HCT 33.4* 29.7*  PLT 120* 132*   BMET Recent Labs    03/18/23 2137 03/19/23 0647  NA 136 137  K 4.2 4.0  CL 93* 95*  CO2 29 28  GLUCOSE 99 95  BUN 26* 32*  CREATININE 7.10* 7.96*  CALCIUM  9.3 8.7*   LFT Recent Labs    03/18/23 2137  PROT 6.8  ALBUMIN 3.7  AST 16  ALT 17  ALKPHOS 72  BILITOT 0.5   PT/INR Recent Labs    03/19/23 0212  LABPROT 12.5  INR 0.9   Hepatitis Panel No results for input(s): HEPBSAG, HCVAB, HEPAIGM, HEPBIGM in the last 72  hours.  Studies/Results: No results found.  Assessment/Plan:  68 year old male with history of hypertension, hyperlipidemia, COPD, BPH, gout, history of prostate cancer, hypercalcemia, secondary hyperparathyroidism, end-stage renal disease on dialysis on a Tuesday Thursdays and Saturday schedule.  After he went home from dialysis yesterday he started to have bowel movements with dark bright red blood.   ESRD: He had stable dialysis yesterday.  Will schedule him for dialysis again Tuesday.  ANEMIA: Patient now has acute GI bleed.  Will continue to monitor hemoglobin closely.  GI is following.  MBD: Patient has history of secondary hyperparathyroidism.  Will check PTH, calcium  and phosphorus levels.  HTN/VOL: Normal.  Lower GI bleed: Patient is being followed by GI.  Labs and medications reviewed. Will continue to monitor closely.    LOS: 0 Pinkey Edman, MD [facility] @TODAY @1 :19 PM

## 2023-03-19 NOTE — Progress Notes (Signed)
 Interim progress note not for billing.  Admitted earlier this morning by my colleague. I have reviewed the chart, seen and examined the patient, agree with assessment and plan unless otherwise stipulated.  No additional bleeding, hgb no sig anemia. Gi consult pending. Nephrology consulted for maintenance hemodialysis. Will trend hgb for now, maintain clears.

## 2023-03-19 NOTE — Consult Note (Signed)
 [facility] GI Inpatient Consult Note   Ladell Francis Boss, M.D.  Reason for Consult: Painless rectal bleeding   Attending Requesting Consult: Devaughn Ban, M.D.   History of Present Illness: Dwayne Reed is a 68 y.o. male who reports ports with some episodes of painless rectal bleeding.  Patient has a history of end-stage renal disease on hemodialysis, chronic anemia. Patient reportedly not taking antianticoagulants and no personal history of colon cancer.  Per review of the records, he was scheduled for colonoscopies on 3 separate occasions in 2022 and 2023 but had canceled all of these.  He maintains he had a colonoscopy about 6 years ago with results of  some precancerous polyps but I have been unable to locate the procedure reports. Patient denies intractable heartburn, dysphagia, hemetemesis, abdominal pain, nausea or vomiting.  He takes no prescription anticoagulants.      Past Medical History:  Past Medical History:  Diagnosis Date   Anemia    low iron    CHF (congestive heart failure) (HCC)    pt denies   COPD (chronic obstructive pulmonary disease) (HCC)    Dialysis patient [facility])    History of kidney stones    Hypertension    Kidney stones    Pneumonia    Renal disorder    ESRD on HD (M,W,F)    Problem List: Patient Active Problem List   Diagnosis Date Noted   GI bleed 03/19/2023   Allergy, unspecified, initial encounter 11/18/2021   Anaphylactic shock, unspecified, initial encounter 11/18/2021   Abnormal ECG 08/19/2021   Hyperlipidemia, mixed 08/19/2021   Hypercalcemia 09/23/2020   Urinary tract infection with hematuria    Hydronephrosis with renal and ureteral calculus obstruction 02/24/2020   Breakdown of surgically created AV fistula, init (HCC) 10/23/2017   Decreased energy 10/23/2017   Mild protein-calorie malnutrition (HCC) 08/18/2017   Anemia in chronic kidney disease 07/29/2017   Coagulation defect, unspecified (HCC) 07/29/2017   Iron   deficiency anemia, unspecified 07/29/2017   Secondary hyperparathyroidism of renal origin (HCC) 07/29/2017   ESRD on dialysis (HCC) 07/26/2017   SOB (shortness of breath) 07/25/2017   Recurrent nephrolithiasis 06/21/2017   Pneumonia 05/18/2017   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   COPD (chronic obstructive pulmonary disease) (HCC) 07/14/2016   History of renal cell cancer 07/12/2016   Olecranon bursitis 05/27/2016   Malignant tumor of prostate (HCC) 09/18/2015   Benign prostatic hyperplasia with lower urinary tract symptoms 08/05/2015   Pre-transplant evaluation for kidney transplant 08/05/2015   Acquired arteriovenous fistula (HCC) 04/30/2015   ESRD on hemodialysis (HCC) 02/04/2015   Hyperparathyroidism (HCC) 02/04/2015   Hyperphosphatemia 02/04/2015   Anemia of renal disease 02/04/2015   History of transfusion 12/13/2014   Elevated prostate specific antigen (PSA) 08/18/2012   Essential hypertension 08/18/2012   Gout 08/18/2012   Acquired cyst of kidney 04/13/2012    Past Surgical History: Past Surgical History:  Procedure Laterality Date   CYSTOSCOPY/URETEROSCOPY/HOLMIUM LASER/STENT PLACEMENT N/A 02/25/2020   Procedure: CYSTOSCOPY/URETEROSCOPY/HOLMIUM LASER/STENT PLACEMENT;  Surgeon: Twylla Glendia BROCKS, MD;  Location: [facility] ORS;  Service: Urology;  Laterality: N/A;   CYSTOSCOPY/URETEROSCOPY/HOLMIUM LASER/STENT PLACEMENT Right 03/09/2020   Procedure: CYSTOSCOPY/URETEROSCOPY/HOLMIUM LASER/STENT Exchange;  Surgeon: Twylla Glendia BROCKS, MD;  Location: [facility] ORS;  Service: Urology;  Laterality: Right;   EXTRACORPOREAL SHOCK WAVE LITHOTRIPSY     LITHOTRIPSY     x 5   PROSTATE BIOPSY      Allergies: Allergies  Allergen Reactions   Cinacalcet  Other (See Comments)    Other  reaction(s): Unknown    Home Medications: (Not in a hospital admission)  Home medication reconciliation was completed with the patient.   Scheduled Inpatient Medications:    nicotine   7 mg Transdermal  Daily   pantoprazole  (PROTONIX ) IV  40 mg Intravenous Q12H   tamsulosin   0.4 mg Oral QPC supper    Continuous Inpatient Infusions:    PRN Inpatient Medications:  acetaminophen  **OR** acetaminophen   Family History: family history includes Cancer in his maternal grandmother; Heart disease in his mother; Hypertension in his mother; Sarcoidosis in his sister.   GI Family History: Negative.  Social History:   reports that he has been smoking cigarettes. He has a 7.5 pack-year smoking history. He has never used smokeless tobacco. He reports that he does not currently use alcohol. He reports that he does not use drugs. The patient denies ETOH, tobacco, or drug use.    Review of Systems: Review of Systems - Negative except HPI  Physical Examination: BP (!) 140/78 (BP Location: Left Arm)   Pulse 90   Temp 98 F (36.7 C) (Oral)   Resp 18   Ht 6' (1.829 m)   Wt 78.5 kg   SpO2 100%   BMI 23.46 kg/m  Physical Exam Constitutional:      Appearance: Normal appearance. He is normal weight.  HENT:     Head: Normocephalic and atraumatic.     Nose: Nose normal.  Eyes:     General: No scleral icterus.    Extraocular Movements: Extraocular movements intact.     Conjunctiva/sclera: Conjunctivae normal.     Pupils: Pupils are equal, round, and reactive to light.  Cardiovascular:     Rate and Rhythm: Normal rate.     Pulses: Normal pulses.     Heart sounds: Normal heart sounds.  Pulmonary:     Effort: Pulmonary effort is normal. No respiratory distress.     Breath sounds: No stridor. No wheezing or rhonchi.  Abdominal:     General: There is no distension.     Palpations: Abdomen is soft. There is no mass.     Tenderness: There is no abdominal tenderness. There is no rebound.     Hernia: No hernia is present.  Musculoskeletal:        General: No swelling or deformity.  Skin:    General: Skin is warm and dry.     Capillary Refill: Capillary refill takes less than 2 seconds.   Neurological:     General: No focal deficit present.     Mental Status: He is alert.  Psychiatric:        Mood and Affect: Mood normal.        Behavior: Behavior normal.        Thought Content: Thought content normal.        Judgment: Judgment normal.     Data: Lab Results  Component Value Date   WBC 3.6 (L) 03/19/2023   HGB 9.9 (L) 03/19/2023   HCT 29.7 (L) 03/19/2023   MCV 100.0 03/19/2023   PLT 132 (L) 03/19/2023   Recent Labs  Lab 03/18/23 2137 03/19/23 0828  HGB 10.7* 9.9*   Lab Results  Component Value Date   NA 137 03/19/2023   K 4.0 03/19/2023   CL 95 (L) 03/19/2023   CO2 28 03/19/2023   BUN 32 (H) 03/19/2023   CREATININE 7.96 (H) 03/19/2023   Lab Results  Component Value Date   ALT 17 03/18/2023   AST 16 03/18/2023  ALKPHOS 72 03/18/2023   BILITOT 0.5 03/18/2023   Recent Labs  Lab 03/19/23 0212  INR 0.9      Latest Ref Rng & Units 03/19/2023    8:28 AM 03/18/2023    9:37 PM 03/09/2022    9:42 AM  CBC  WBC 4.0 - 10.5 K/uL 3.6  4.1  4.3   Hemoglobin 13.0 - 17.0 g/dL 9.9  89.2  87.7   Hematocrit 39.0 - 52.0 % 29.7  33.4  36.6   Platelets 150 - 400 K/uL 132  120  130     STUDIES: No results found. @IMAGES @  Assessment:  Painless Lower GI bleeding -Mainly BRBPR. Ddx includes diverticular bleeding (most likely), ischemic colitis ,colorectal malignancy, IBD, internal hemorrhoids. NO UGI symptoms to suggest an UGI source at this time.  Anemia - likely multifactorial with bleeding and ESRD.  End Stage renal disease on hemodialysis. Black stool - Patient on auryxia  (iron  supplement) on chronic basis. COPD, stable. Hypertension    Recommendations:  Patient on T,R,Sat hemodialysis schedule. Will plan colonoscopy tomorrow. The patient understands the nature of the planned procedure, indications, risks, alternatives and potential complications including but not limited to bleeding, infection, perforation, damage to internal organs and possible  oversedation/side effects from anesthesia. The patient agrees and gives consent to proceed.  Please refer to procedure notes for findings, recommendations and patient disposition/instructions per Dr. Jinny. Case discussed with attending MD, Dr. Kandis.   Thank you for the consult. Please call with questions or concerns.  Aundria Ladell Eck MD [facility] [address] [place], [place] [address] [phone]  03/19/2023 11:23 AM

## 2023-03-19 NOTE — ED Notes (Signed)
 Pt reports only 1 episode of bleeding

## 2023-03-19 NOTE — ED Provider Notes (Signed)
 [facility] Provider Note    Event Date/Time   First MD Initiated Contact with Patient 03/19/23 0126     (approximate)   History   Rectal Bleeding   HPI  Dwayne Reed is a 68 y.o. male with history of end-stage renal disease on hemodialysis, hypertension, COPD who presents to the emergency department with complaints of rectal bleeding.  States earlier today he noticed that his pants were wet and went to the bathroom and saw that his pants and underwear were soaked with bright red blood.  He states he wiped himself and there was a large blood clot.  He has not had any bleeding with bowel movements and is not having abdominal pain.  He is not on any blood thinners.  He reports his last colonoscopy was about 5 to 6 years ago and he had polyps.  No known history of colon cancer.  He denies any vomiting.  States he has not gone to the bathroom since this episode.   History provided by patient, wife.    Past Medical History:  Diagnosis Date   Anemia    low iron    CHF (congestive heart failure) (HCC)    pt denies   COPD (chronic obstructive pulmonary disease) (HCC)    Dialysis patient (HCC)    History of kidney stones    Hypertension    Kidney stones    Pneumonia    Renal disorder    ESRD on HD (M,W,F)    Past Surgical History:  Procedure Laterality Date   CYSTOSCOPY/URETEROSCOPY/HOLMIUM LASER/STENT PLACEMENT N/A 02/25/2020   Procedure: CYSTOSCOPY/URETEROSCOPY/HOLMIUM LASER/STENT PLACEMENT;  Surgeon: Twylla Glendia BROCKS, MD;  Location: [facility] ORS;  Service: Urology;  Laterality: N/A;   CYSTOSCOPY/URETEROSCOPY/HOLMIUM LASER/STENT PLACEMENT Right 03/09/2020   Procedure: CYSTOSCOPY/URETEROSCOPY/HOLMIUM LASER/STENT Exchange;  Surgeon: Twylla Glendia BROCKS, MD;  Location: [facility] ORS;  Service: Urology;  Laterality: Right;   EXTRACORPOREAL SHOCK WAVE LITHOTRIPSY     LITHOTRIPSY     x 5   PROSTATE BIOPSY      MEDICATIONS:  Prior to Admission medications    Medication Sig Start Date End Date Taking? Authorizing Provider  AURYXIA  1 GM 210 MG(Fe) tablet Take 3 tablets (630 mg total) by mouth 3 (three) times daily. 03/19/19   Cleatus Arlyss RAMAN, MD  B Complex-C-Zn-Folic Acid  (DIALYVITE 800-ZINC  15) 0.8 MG TABS SMARTSIG:1 Tablet(s) By Mouth Every Evening    [provider]  nicotine  (NICODERM CQ ) 21 mg/24hr patch Place 1 patch (21 mg total) onto the skin daily. 02/02/23   Joshua Cathryne BROCKS, MD    Physical Exam   Triage Vital Signs: ED Triage Vitals  Encounter Vitals Group     BP 03/18/23 2135 119/72     Systolic BP Percentile --      Diastolic BP Percentile --      Pulse Rate 03/18/23 2135 81     Resp 03/18/23 2135 14     Temp 03/18/23 2135 98.4 F (36.9 C)     Temp Source 03/18/23 2135 Oral     SpO2 03/18/23 2135 100 %     Weight 03/18/23 2132 173 lb (78.5 kg)     Height 03/18/23 2132 6' (1.829 m)     Head Circumference --      Peak Flow --      Pain Score 03/18/23 2132 0     Pain Loc --      Pain Education --      Exclude from Growth Chart --  Most recent vital signs: Vitals:   03/18/23 2135  BP: 119/72  Pulse: 81  Resp: 14  Temp: 98.4 F (36.9 C)  SpO2: 100%    CONSTITUTIONAL: Alert, responds appropriately to questions. Well-appearing; well-nourished HEAD: Normocephalic, atraumatic EYES: Conjunctivae clear, pupils appear equal, sclera nonicteric ENT: normal nose; moist mucous membranes NECK: Supple, normal ROM CARD: RRR; S1 and S2 appreciated RESP: Normal chest excursion without splinting or tachypnea; breath sounds clear and equal bilaterally; no wheezes, no rhonchi, no rales, no hypoxia or respiratory distress, speaking full sentences ABD/GI: Non-distended; soft, non-tender, no rebound, no guarding, no peritoneal signs RECTAL:  Normal rectal tone, no gross red blood but patient has melena that is strongly guaiac positive, no hemorrhoids appreciated, nontender rectal exam, no fecal impaction.  BACK: The back  appears normal EXT: Normal ROM in all joints; no deformity noted, no edema SKIN: Normal color for age and race; warm; no rash on exposed skin NEURO: Moves all extremities equally, normal speech PSYCH: The patient's mood and manner are appropriate.   ED Results / Procedures / Treatments   LABS: (all labs ordered are listed, but only abnormal results are displayed) Labs Reviewed  COMPREHENSIVE METABOLIC PANEL - Abnormal; Notable for the following components:      Result Value   Chloride 93 (*)    BUN 26 (*)    Creatinine, Ser 7.10 (*)    GFR, Estimated 8 (*)    All other components within normal limits  CBC - Abnormal; Notable for the following components:   RBC 3.23 (*)    Hemoglobin 10.7 (*)    HCT 33.4 (*)    MCV 103.4 (*)    RDW 15.6 (*)    Platelets 120 (*)    All other components within normal limits  PROTIME-INR  HEMOGLOBIN AND HEMATOCRIT, BLOOD  HEMOGLOBIN AND HEMATOCRIT, BLOOD  BASIC METABOLIC PANEL  TYPE AND SCREEN     EKG:  EKG Interpretation Date/Time:    Ventricular Rate:    PR Interval:    QRS Duration:    QT Interval:    QTC Calculation:   R Axis:      Text Interpretation:           RADIOLOGY: My personal review and interpretation of imaging:    I have personally reviewed all radiology reports.   No results found.   PROCEDURES:  Critical Care performed: No     Procedures    IMPRESSION / MDM / ASSESSMENT AND PLAN / ED COURSE  I reviewed the triage vital signs and the nursing notes.    Patient here with rectal bleeding.  Has melanotic stool on rectal exam that is guaiac positive.     DIFFERENTIAL DIAGNOSIS (includes but not limited to):   GI bleed, diverticular bleed, peptic ulcer disease, no history of alcohol abuse or esophageal varices.  Doubt colitis, diverticulitis given no abdominal pain and benign abdominal exam.   Patient's presentation is most consistent with acute presentation with potential threat to life or  bodily function.   PLAN: Labs obtained from triage.  Hemoglobin of 10.7.  Last for comparison was in December 2023 and was 12.2.  He does have a platelet count of 120,000.  Not on anticoagulation or antiplatelets.  Normal electrolytes, LFTs.  Abdominal exam benign.  He does have melanotic stool on rectal exam that is strongly guaiac positive.  I am concerned for potential upper GI bleed.  Will start Protonix .  I feel he will need admission to the  hospital for close monitoring and GI consultation.  Will keep NPO.   MEDICATIONS GIVEN IN ED: Medications  pantoprazole  (PROTONIX ) injection 40 mg (has no administration in time range)    Followed by  pantoprazole  (PROTONIX ) injection 40 mg (has no administration in time range)  acetaminophen  (TYLENOL ) tablet 650 mg (has no administration in time range)    Or  acetaminophen  (TYLENOL ) suppository 650 mg (has no administration in time range)     ED COURSE:  Consulted and discussed patient's case with hospitalist, Dr. Laveda.  I have recommended admission and consulting physician agrees and will place admission orders.  Patient (and family if present) agree with this plan.   I reviewed all nursing notes, vitals, pertinent previous records.  All labs, EKGs, imaging ordered have been independently reviewed and interpreted by myself.      OUTSIDE RECORDS REVIEWED: Reviewed last primary care note in November 2024.       FINAL CLINICAL IMPRESSION(S) / ED DIAGNOSES   Final diagnoses:  Rectal bleeding  Melena     Rx / DC Orders   ED Discharge Orders     None        Note:  This document was prepared using Dragon voice recognition software and may include unintentional dictation errors.   Lakeyia Surber, Josette SAILOR, DO 03/19/23 [phone]

## 2023-03-19 NOTE — H&P (Signed)
 PCP:   Dwayne Cathryne BROCKS, MD   Chief Complaint:  GI bleed.  HPI: This is a 68 year old male with past medical history of HLD, hypercalcemia, ESRD [T/Th/Sat], secondary hyperparathyroidism, recurrent nephrolithiasis, COPD, BPH, hyperparathyroidism, gout, HTN, history of prostate cancer.  Today while ultras his wife, he thought he started something wet.  On returning home he found with blood.  On wiping he found clots.  He states this was bright red blood.  His wife made him come to the ER.  The patient denies history of GI bleed.  He denies GERD, NSAID use.  He adds loss of heartburn prior to his food.  His last colonoscopy was approximately 6 years ago.  At that point he was told he had precancerous polyps, he has had no follow-up colonoscopy since  In the ER patient hemodynamically stable.  Hemoglobin 10.7, previous hemoglobin 12.2 done 03/09/2022.  Patient with grossly melanotic stool on examination in the ER, guaiac positive.  [Patient maintained on daily Auryxia  which is p.o. iron ]  Review of Systems:  Per HPI  Past Medical History: Past Medical History:  Diagnosis Date   Anemia    low iron    CHF (congestive heart failure) (HCC)    pt denies   COPD (chronic obstructive pulmonary disease) (HCC)    Dialysis patient (HCC)    History of kidney stones    Hypertension    Kidney stones    Pneumonia    Renal disorder    ESRD on HD (M,W,F)   Past Surgical History:  Procedure Laterality Date   CYSTOSCOPY/URETEROSCOPY/HOLMIUM LASER/STENT PLACEMENT N/A 02/25/2020   Procedure: CYSTOSCOPY/URETEROSCOPY/HOLMIUM LASER/STENT PLACEMENT;  Surgeon: Dwayne Glendia BROCKS, MD;  Location: [facility] ORS;  Service: Urology;  Laterality: N/A;   CYSTOSCOPY/URETEROSCOPY/HOLMIUM LASER/STENT PLACEMENT Right 03/09/2020   Procedure: CYSTOSCOPY/URETEROSCOPY/HOLMIUM LASER/STENT Exchange;  Surgeon: Dwayne Glendia BROCKS, MD;  Location: [facility] ORS;  Service: Urology;  Laterality: Right;   EXTRACORPOREAL SHOCK WAVE LITHOTRIPSY      LITHOTRIPSY     x 5   PROSTATE BIOPSY      Medications: Prior to Admission medications   Medication Sig Start Date End Date Taking? Authorizing Provider  AURYXIA  1 GM 210 MG(Fe) tablet Take 3 tablets (630 mg total) by mouth 3 (three) times daily. 03/19/19   Dwayne Arlyss RAMAN, MD  B Complex-C-Zn-Folic Acid  (DIALYVITE 800-ZINC  15) 0.8 MG TABS SMARTSIG:1 Tablet(s) By Mouth Every Evening    [provider]  nicotine  (NICODERM CQ ) 21 mg/24hr patch Place 1 patch (21 mg total) onto the skin daily. 02/02/23   Dwayne Cathryne BROCKS, MD    Allergies:   Allergies  Allergen Reactions   Cinacalcet  Other (See Comments)    Other reaction(s): Unknown    Social History:  reports that he has been smoking cigarettes. He has a 7.5 pack-year smoking history. He has never used smokeless tobacco. He reports that he does not currently use alcohol. He reports that he does not use drugs.  Family History: Family History  Problem Relation Age of Onset   Hypertension Mother    Heart disease Mother    Sarcoidosis Sister    Cancer Maternal Grandmother        type unknown    Physical Exam: Vitals:   03/18/23 2132 03/18/23 2135  BP:  119/72  Pulse:  81  Resp:  14  Temp:  98.4 F (36.9 C)  TempSrc:  Oral  SpO2:  100%  Weight: 78.5 kg   Height: 6' (1.829 m)     General:  A and O x 3, well developed and nourished, no acute distress Eyes: Pink conjunctiva, no scleral icterus ENT: Moist oral mucosa, neck supple, no thyromegaly Lungs: CTA B/L, no wheeze, no crackles, no use of accessory muscles Cardiovascular: RRR, no regurgitation, no gallops, no murmurs. No carotid bruits, no JVD Abdomen: soft, positive BS, NTND, no organomegaly, not an acute abdomen GU: not examined Neuro: CN II - XII grossly intact, sensation intact Musculoskeletal: strength 5/5 all extremities, no  edema Skin: no rash, no subcutaneous crepitation, no decubitus Psych: appropriate patient   Labs on Admission:  Recent Labs     03/18/23 2137  NA 136  K 4.2  CL 93*  CO2 29  GLUCOSE 99  BUN 26*  CREATININE 7.10*  CALCIUM  9.3   Recent Labs    03/18/23 2137  AST 16  ALT 17  ALKPHOS 72  BILITOT 0.5  PROT 6.8  ALBUMIN 3.7    Recent Labs    03/18/23 2137  WBC 4.1  HGB 10.7*  HCT 33.4*  MCV 103.4*  PLT 120*    Radiological Exams on Admission: No results found.  Assessment/Plan Present on Admission:  GI bleed -Unclear upper or lower GI bleed given melanotic stool, however patient on as needed. -N.p.o.  Type and cross.  Serial H&H every 6 hours -IV Protonix  every 12 ordered -GI consult placed via epic   ESRD  -T/Th/Sat.  Nephrology consult placed via epic   BPH with lower urinary tract symptoms -Flomax  intiated   Tobacco use -Nicotine  patch ordered   Dwayne Reed 03/19/2023, 2:20 AM

## 2023-03-20 DIAGNOSIS — K922 Gastrointestinal hemorrhage, unspecified: Secondary | ICD-10-CM | POA: Diagnosis not present

## 2023-03-20 LAB — RENAL FUNCTION PANEL
Albumin: 3.3 g/dL — ABNORMAL LOW (ref 3.5–5.0)
Anion gap: 15 (ref 5–15)
BUN: 42 mg/dL — ABNORMAL HIGH (ref 8–23)
CO2: 28 mmol/L (ref 22–32)
Calcium: 8.8 mg/dL — ABNORMAL LOW (ref 8.9–10.3)
Chloride: 92 mmol/L — ABNORMAL LOW (ref 98–111)
Creatinine, Ser: 10.26 mg/dL — ABNORMAL HIGH (ref 0.61–1.24)
GFR, Estimated: 5 mL/min — ABNORMAL LOW (ref >60)
Glucose, Bld: 91 mg/dL (ref 70–99)
Phosphorus: 5 mg/dL — ABNORMAL HIGH (ref 2.5–4.6)
Potassium: 4.4 mmol/L (ref 3.5–5.1)
Sodium: 135 mmol/L (ref 135–145)

## 2023-03-20 LAB — CBC
HCT: 30.9 % — ABNORMAL LOW (ref 39.0–52.0)
Hemoglobin: 10 g/dL — ABNORMAL LOW (ref 13.0–17.0)
MCH: 32.4 pg (ref 26.0–34.0)
MCHC: 32.4 g/dL (ref 30.0–36.0)
MCV: 100 fL (ref 80.0–100.0)
Platelets: 120 10*3/uL — ABNORMAL LOW (ref 150–400)
RBC: 3.09 MIL/uL — ABNORMAL LOW (ref 4.22–5.81)
RDW: 15.2 % (ref 11.5–15.5)
WBC: 3.8 10*3/uL — ABNORMAL LOW (ref 4.0–10.5)
nRBC: 0 % (ref 0.0–0.2)

## 2023-03-20 LAB — HEPATITIS B SURFACE ANTIGEN: Hepatitis B Surface Ag: NONREACTIVE

## 2023-03-20 MED ORDER — ROPINIROLE HCL 0.25 MG PO TABS
0.2500 mg | ORAL_TABLET | Freq: Every day | ORAL | Status: DC
  Administered 2023-03-20 – 2023-03-21 (×2): 0.25 mg via ORAL
  Filled 2023-03-20 (×2): qty 1

## 2023-03-20 MED ORDER — CHLORHEXIDINE GLUCONATE CLOTH 2 % EX PADS
6.0000 | MEDICATED_PAD | Freq: Every day | CUTANEOUS | Status: DC
Start: 2023-03-21 — End: 2023-03-22
  Administered 2023-03-21 – 2023-03-22 (×2): 6 via TOPICAL

## 2023-03-20 MED ORDER — BISACODYL 5 MG PO TBEC: 10.0000 mg | DELAYED_RELEASE_TABLET | Freq: Once | ORAL | Status: DC

## 2023-03-20 MED ORDER — SODIUM CHLORIDE 0.9% FLUSH
3.0000 mL | INTRAVENOUS | Status: DC | PRN
  Administered 2023-03-20: 10 mL via INTRAVENOUS

## 2023-03-20 MED ORDER — PEG 3350-KCL-NA BICARB-NACL 420 G PO SOLR
4000.0000 mL | Freq: Once | ORAL | Status: AC
  Administered 2023-03-20: 4000 mL via ORAL
  Filled 2023-03-20: qty 4000

## 2023-03-20 MED ORDER — SODIUM CHLORIDE 0.9% FLUSH
3.0000 mL | Freq: Two times a day (BID) | INTRAVENOUS | Status: DC
  Administered 2023-03-20: 10 mL via INTRAVENOUS
  Administered 2023-03-20: 3 mL via INTRAVENOUS
  Administered 2023-03-21: 10 mL via INTRAVENOUS

## 2023-03-20 NOTE — Progress Notes (Signed)
 Referring Provider: No ref. provider found Primary Care Physician:  Joshua Cathryne BROCKS, MD Primary Nephrologist:  Dr.   [place] for Consultation: ESRD  HPI: 68 year old male with history of hypertension, hyperlipidemia, COPD, BPH, gout, history of prostate cancer, hypercalcemia, secondary hyperparathyroidism, end-stage renal disease on dialysis on a Tuesday Thursdays and Saturday schedule.    This patient is seen sitting up in bed Currently n.p.o. for colonoscopy Reports he is having difficulty completing the prep due to nausea Denies any recent missed treatments  Past Medical History:  Diagnosis Date   Anemia    low iron    CHF (congestive heart failure) (HCC)    pt denies   COPD (chronic obstructive pulmonary disease) (HCC)    Dialysis patient (HCC)    History of kidney stones    Hypertension    Kidney stones    Pneumonia    Renal disorder    ESRD on HD (M,W,F)    Past Surgical History:  Procedure Laterality Date   CYSTOSCOPY/URETEROSCOPY/HOLMIUM LASER/STENT PLACEMENT N/A 02/25/2020   Procedure: CYSTOSCOPY/URETEROSCOPY/HOLMIUM LASER/STENT PLACEMENT;  Surgeon: Twylla Glendia BROCKS, MD;  Location: [facility];  Service: Urology;  Laterality: N/A;   CYSTOSCOPY/URETEROSCOPY/HOLMIUM LASER/STENT PLACEMENT Right 03/09/2020   Procedure: CYSTOSCOPY/URETEROSCOPY/HOLMIUM LASER/STENT Exchange;  Surgeon: Twylla Glendia BROCKS, MD;  Location: [facility];  Service: Urology;  Laterality: Right;   EXTRACORPOREAL SHOCK WAVE LITHOTRIPSY     LITHOTRIPSY     x 5   PROSTATE BIOPSY      Prior to Admission medications   Medication Sig Start Date End Date Taking? Authorizing Provider  AURYXIA  1 GM 210 MG(Fe) tablet Take 3 tablets (630 mg total) by mouth 3 (three) times daily. 03/19/19  Yes Cleatus Arlyss RAMAN, MD  B Complex-C-Zn-Folic Acid  (DIALYVITE 800-ZINC  15) 0.8 MG TABS SMARTSIG:1 Tablet(s) By Mouth Every Evening   Yes [provider]  hydrALAZINE  (APRESOLINE ) 25 MG tablet Take 25 mg by mouth in the  morning and at bedtime. Patient not taking: Reported on 03/19/2023 02/18/23   [provider]  Methoxy PEG-Epoetin  Beta (MIRCERA IJ) every 14 (fourteen) days. Patient not taking: Reported on 03/19/2023 03/18/23 03/16/24  [provider]  nicotine  (NICODERM CQ ) 21 mg/24hr patch Place 1 patch (21 mg total) onto the skin daily. Patient not taking: Reported on 03/19/2023 02/02/23   Joshua Cathryne BROCKS, MD  rOPINIRole  (REQUIP ) 0.25 MG tablet Take 0.25 mg by mouth at bedtime. Patient not taking: Reported on 03/19/2023 02/21/23   [provider]    Current Facility-Administered Medications  Medication Dose Route Frequency Provider Last Rate Last Admin   acetaminophen  (TYLENOL ) tablet 650 mg  650 mg Oral Q6H PRN Crosley, Debby, MD       Or   acetaminophen  (TYLENOL ) suppository 650 mg  650 mg Rectal Q6H PRN Crosley, Debby, MD       bisacodyl  (DULCOLAX) EC tablet 10 mg  10 mg Oral Once Eastwood, Teodoro K, MD       nicotine  (NICODERM CQ  - dosed in mg/24 hr) patch 7 mg  7 mg Transdermal Daily Crosley, Debby, MD   7 mg at 03/19/23 9662   pantoprazole  (PROTONIX ) injection 40 mg  40 mg Intravenous Q12H Ward, Kristen N, DO   40 mg at 03/20/23 9081   rOPINIRole  (REQUIP ) tablet 0.25 mg  0.25 mg Oral QHS Wouk, Devaughn Sayres, MD       sodium chloride  flush (NS) 0.9 % injection 3-10 mL  3-10 mL Intravenous Q12H [place]edo, Teodoro K, MD   3 mL at 03/20/23  9088   sodium chloride  flush (NS) 0.9 % injection 3-10 mL  3-10 mL Intravenous PRN [place]edo, Teodoro K, MD   10 mL at 03/20/23 9089   tamsulosin  (FLOMAX ) capsule 0.4 mg  0.4 mg Oral QPC supper Laveda Roosevelt, MD   0.4 mg at 03/19/23 1802    Allergies as of 03/18/2023 - Review Complete 03/18/2023  Allergen Reaction Noted   Cinacalcet  Other (See Comments) 03/18/2020    Family History  Problem Relation Age of Onset   Hypertension Mother    Heart disease Mother    Sarcoidosis Sister    Cancer Maternal Grandmother        type unknown    Social History    Socioeconomic History   Marital status: Married    Spouse name: Not on file   Number of children: 1   Years of education: Not on file   Highest education level: Not on file  Occupational History   Occupation: admin support speacialist  Tobacco Use   Smoking status: Every Day    Current packs/day: 0.25    Average packs/day: 0.3 packs/day for 30.0 years (7.5 ttl pk-yrs)    Types: Cigarettes   Smokeless tobacco: Never  Vaping Use   Vaping status: Never Used  Substance and Sexual Activity   Alcohol use: Not Currently    Comment: on holidays   Drug use: Never   Sexual activity: Not Currently  Other Topics Concern   Not on file  Social History Narrative   Not on file   Social Drivers of Health   Financial Resource Strain: Low Risk  (09/12/2022)   Overall Financial Resource Strain (CARDIA)    Difficulty of Paying Living Expenses: Not hard at all  Food Insecurity: No Food Insecurity (09/12/2022)   Hunger Vital Sign    Worried About Running Out of Food in the Last Year: Never true    Ran Out of Food in the Last Year: Never true  Transportation Needs: No Transportation Needs (09/12/2022)   PRAPARE - Administrator, Civil Service (Medical): No    Lack of Transportation (Non-Medical): No  Physical Activity: Inactive (09/12/2022)   Exercise Vital Sign    Days of Exercise per Week: 0 days    Minutes of Exercise per Session: 0 min  Stress: No Stress Concern Present (09/12/2022)   [employer] of Occupational Health - Occupational Stress Questionnaire    Feeling of Stress : Not at all  Social Connections: Not on file  Intimate Partner Violence: Not At Risk (09/12/2022)   Humiliation, Afraid, Rape, and Kick questionnaire    Fear of Current or Ex-Partner: No    Emotionally Abused: No    Physically Abused: No    Sexually Abused: No    Physical Exam: Vital signs in last 24 hours: Temp:  [97.8 F (36.6 C)-98.3 F (36.8 C)] 97.9 F (36.6 C) (01/06 0828) Pulse Rate:   [66-73] 67 (01/06 0828) Resp:  [13-18] 16 (01/06 0828) BP: (125-142)/(71-85) 142/82 (01/06 0828) SpO2:  [94 %-100 %] 100 % (01/06 0828) Last BM Date : 03/20/23 General:   Alert,  Well-developed, well-nourished, pleasant and cooperative in NAD Head:  Normocephalic and atraumatic. Eyes:  Sclera clear, no icterus.   Conjunctiva pink. Ears:  Normal auditory acuity. Nose:  No deformity, discharge,  or lesions. Lungs:  Clear throughout to auscultation.   No wheezes, crackles, or rhonchi. No acute distress. Heart:  Regular rate and rhythm; no murmurs, clicks, rubs,  or gallops. Abdomen:  Soft, nontender and nondistended. No masses, hepatosplenomegaly or hernias noted. Normal bowel sounds, without guarding, and without rebound.   Extremities:  Without clubbing or edema.  Intake/Output from previous day: No intake/output data recorded. Intake/Output this shift: No intake/output data recorded.  Lab Results: Recent Labs    03/18/23 2137 03/19/23 0828 03/20/23 0447  WBC 4.1 3.6* 3.8*  HGB 10.7* 9.9* 10.0*  HCT 33.4* 29.7* 30.9*  PLT 120* 132* 120*   BMET Recent Labs    03/18/23 2137 03/19/23 0647 03/20/23 0447  NA 136 137 135  K 4.2 4.0 4.4  CL 93* 95* 92*  CO2 29 28 28   GLUCOSE 99 95 91  BUN 26* 32* 42*  CREATININE 7.10* 7.96* 10.26*  CALCIUM  9.3 8.7* 8.8*  PHOS  --   --  5.0*   LFT Recent Labs    03/18/23 2137 03/20/23 0447  PROT 6.8  --   ALBUMIN 3.7 3.3*  AST 16  --   ALT 17  --   ALKPHOS 72  --   BILITOT 0.5  --    PT/INR Recent Labs    03/19/23 0212  LABPROT 12.5  INR 0.9   Hepatitis Panel No results for input(s): HEPBSAG, HCVAB, HEPAIGM, HEPBIGM in the last 72 hours.  Studies/Results: No results found.  Assessment/Plan:  68 year old male with history of hypertension, hyperlipidemia, COPD, BPH, gout, history of prostate cancer, hypercalcemia, secondary hyperparathyroidism, end-stage renal disease on dialysis on a Tuesday Thursdays and  Saturday schedule.  After he went home from dialysis yesterday he started to have bowel movements with dark bright red blood.   ESRD on hemodialysis: Last treatment completed on Saturday.  Next treatment scheduled for Tuesday after procedure.  ANEMIA with chronic kidney disease and acute blood loss: Patient has acute GI bleed.  Scheduled for colonoscopy today however unable to complete due to lack of prep.  Procedure rescheduled for tomorrow.  Secondary hyperparathyroidism: Outpatient labs: PTH 709, phosphorus 5.0, and calcium  9.3 on 02/14/2023.  Will continue to monitor bone minerals.  Hypertension with chronic kidney disease.  Home regimen includes hydralazine .  Currently held at this time.  Lower GI bleed: GI consulted and planning colonoscopy.  Labs and medications reviewed. Will continue to monitor closely.    LOS: 1 [facility] @TODAY @3 :29 PM

## 2023-03-20 NOTE — ED Notes (Signed)
 Patient denies any episodes of active rectal bleeding or bloody stools during night shift.

## 2023-03-20 NOTE — Progress Notes (Signed)
 PROGRESS NOTE    Dwayne Reed  FMW:[id number] DOB: Jun 28, 1955 DOA: 03/19/2023 PCP: Joshua Cathryne BROCKS, MD  Outpatient Specialists: nephrology    Brief Narrative:   From admission h and p  This is a 68 year old male with past medical history of HLD, hypercalcemia, ESRD [T/Th/Sat], secondary hyperparathyroidism, recurrent nephrolithiasis, COPD, BPH, hyperparathyroidism, gout, HTN, history of prostate cancer.  Today while ultras his wife, he thought he started something wet.  On returning home he found with blood.  On wiping he found clots.  He states this was bright red blood.  His wife made him come to the ER.  The patient denies history of GI bleed.  He denies GERD, NSAID use.  He adds loss of heartburn prior to his food.  His last colonoscopy was approximately 6 years ago.  At that point he was told he had precancerous polyps, he has had no follow-up colonoscopy since   In the ER patient hemodynamically stable.  Hemoglobin 10.7, previous hemoglobin 12.2 done 03/09/2022.  Patient with grossly melanotic stool on examination in the ER, guaiac positive.  [Patient maintained on daily Auryxia  which is p.o. iron ]  Assessment & Plan:   Principal Problem:   GI bleed Active Problems:   COPD (chronic obstructive pulmonary disease) (HCC)   Essential hypertension   ESRD on dialysis (HCC)   Benign prostatic hyperplasia with lower urinary tract symptoms   ESRD on hemodialysis (HCC)   Gout   History of renal cell cancer   Malignant tumor of prostate (HCC)   Anemia in chronic kidney disease   Hyperlipidemia, mixed  # Hematochezia Painless, hemodynamically stable with stable hgb of around 10, has had 2 stools since admission with bright red blood. Colonoscopy planned for this morning but patient hadn't started prep yet - prep today - plan for colonoscopy tomorrow  # ESRD On tts hemodialysis, euvolemic - nephrology consulted for maintenance hmodialysis  # Restless leg - home requip    DVT  prophylaxis: scds Code Status: full Family Communication: none at bedside  Level of care: Telemetry Medical Status is: Inpatient Remains inpatient appropriate because: plan for colonoscopy tomorrow    Consultants:  GI  Procedures: pending  Antimicrobials:  none    Subjective: No pain, had two bms yesterday both with some bright red blood  Objective: Vitals:   03/20/23 0500 03/20/23 0530 03/20/23 0737 03/20/23 0828  BP: 125/77 129/72 (!) 142/81 (!) 142/82  Pulse: 69 70 70 67  Resp: 13 15 16 16   Temp:   98 F (36.7 C) 97.9 F (36.6 C)  TempSrc:   Oral Oral  SpO2: 100% 94% 97% 100%  Weight:      Height:       No intake or output data in the 24 hours ending 03/20/23 1300 Filed Weights   03/18/23 2132  Weight: 78.5 kg    Examination:  General exam: Appears calm and comfortable  Respiratory system: Clear to auscultation. Respiratory effort normal. Cardiovascular system: S1 & S2 heard, RRR.  Gastrointestinal system: Abdomen is nondistended, soft and nontender.   Central nervous system: Alert and oriented. No focal neurological deficits. Extremities: Symmetric 5 x 5 power. Skin: No rashes, lesions or ulcers Psychiatry: Judgement and insight appear normal. Mood & affect appropriate.     Data Reviewed: I have personally reviewed following labs and imaging studies  CBC: Recent Labs  Lab 03/18/23 2137 03/19/23 0828 03/20/23 0447  WBC 4.1 3.6* 3.8*  HGB 10.7* 9.9* 10.0*  HCT 33.4* 29.7* 30.9*  MCV  103.4* 100.0 100.0  PLT 120* 132* 120*   Basic Metabolic Panel: Recent Labs  Lab 03/18/23 2137 03/19/23 0647 03/20/23 0447  NA 136 137 135  K 4.2 4.0 4.4  CL 93* 95* 92*  CO2 29 28 28   GLUCOSE 99 95 91  BUN 26* 32* 42*  CREATININE 7.10* 7.96* 10.26*  CALCIUM  9.3 8.7* 8.8*  PHOS  --   --  5.0*   GFR: Estimated Creatinine Clearance: 7.6 mL/min (A) (by C-G formula based on SCr of 10.26 mg/dL (H)). Liver Function Tests: Recent Labs  Lab 03/18/23 2137  03/20/23 0447  AST 16  --   ALT 17  --   ALKPHOS 72  --   BILITOT 0.5  --   PROT 6.8  --   ALBUMIN 3.7 3.3*   No results for input(s): LIPASE, AMYLASE in the last 168 hours. No results for input(s): AMMONIA in the last 168 hours. Coagulation Profile: Recent Labs  Lab 03/19/23 0212  INR 0.9   Cardiac Enzymes: No results for input(s): CKTOTAL, CKMB, CKMBINDEX, TROPONINI in the last 168 hours. BNP (last 3 results) No results for input(s): PROBNP in the last 8760 hours. HbA1C: No results for input(s): HGBA1C in the last 72 hours. CBG: No results for input(s): GLUCAP in the last 168 hours. Lipid Profile: No results for input(s): CHOL, HDL, LDLCALC, TRIG, CHOLHDL, LDLDIRECT in the last 72 hours. Thyroid Function Tests: No results for input(s): TSH, T4TOTAL, FREET4, T3FREE, THYROIDAB in the last 72 hours. Anemia Panel: No results for input(s): VITAMINB12, FOLATE, FERRITIN, TIBC, IRON , RETICCTPCT in the last 72 hours. Urine analysis:    Component Value Date/Time   COLORURINE BROWN (A) 02/24/2020 1226   APPEARANCEUR Cloudy (A) 03/18/2020 0955   LABSPEC 1.017 02/24/2020 1226   PHURINE  02/24/2020 1226    TEST NOT REPORTED DUE TO COLOR INTERFERENCE OF URINE PIGMENT   GLUCOSEU Negative 03/18/2020 0955   HGBUR (A) 02/24/2020 1226    TEST NOT REPORTED DUE TO COLOR INTERFERENCE OF URINE PIGMENT   BILIRUBINUR Negative 03/18/2020 0955   KETONESUR (A) 02/24/2020 1226    TEST NOT REPORTED DUE TO COLOR INTERFERENCE OF URINE PIGMENT   PROTEINUR 3+ (A) 03/18/2020 0955   PROTEINUR (A) 02/24/2020 1226    TEST NOT REPORTED DUE TO COLOR INTERFERENCE OF URINE PIGMENT   NITRITE Negative 03/18/2020 0955   NITRITE (A) 02/24/2020 1226    TEST NOT REPORTED DUE TO COLOR INTERFERENCE OF URINE PIGMENT   LEUKOCYTESUR 3+ (A) 03/18/2020 0955   LEUKOCYTESUR (A) 02/24/2020 1226    TEST NOT REPORTED DUE TO COLOR INTERFERENCE OF URINE PIGMENT    Sepsis Labs: @LABRCNTIP (procalcitonin:4,lacticidven:4)  )No results found for this or any previous visit (from the past 240 hours).       Radiology Studies: No results found.      Scheduled Meds:  bisacodyl   10 mg Oral Once   nicotine   7 mg Transdermal Daily   pantoprazole  (PROTONIX ) IV  40 mg Intravenous Q12H   polyethylene glycol-electrolytes  4,000 mL Oral Once   sodium chloride  flush  3-10 mL Intravenous Q12H   tamsulosin   0.4 mg Oral QPC supper   Continuous Infusions:   LOS: 1 day     Dwayne KATHEE Ban, MD [facility]   If 7PM-7AM, please contact night-coverage [email or web address] Password [email or web address] 03/20/2023, 1:00 PM

## 2023-03-20 NOTE — Progress Notes (Signed)
 The patient did not finish a significant part of their prep with very little stooling and the procedure was canceled.  We will try to continue giving the prep and reschedule the procedure for tomorrow. Patient will be sent back to the floor.

## 2023-03-21 ENCOUNTER — Inpatient Hospital Stay: Payer: Self-pay | Admitting: General Practice

## 2023-03-21 ENCOUNTER — Inpatient Hospital Stay: Payer: Medicare Other | Admitting: General Practice

## 2023-03-21 ENCOUNTER — Encounter
Admission: EM | Discharge status: Home / Self Care | Payer: Self-pay | Source: Home / Self Care | Attending: Obstetrics and Gynecology

## 2023-03-21 DIAGNOSIS — K5730 Diverticulosis of large intestine without perforation or abscess without bleeding: Secondary | ICD-10-CM

## 2023-03-21 DIAGNOSIS — D123 Benign neoplasm of transverse colon: Secondary | ICD-10-CM

## 2023-03-21 DIAGNOSIS — K635 Polyp of colon: Secondary | ICD-10-CM

## 2023-03-21 DIAGNOSIS — D124 Benign neoplasm of descending colon: Secondary | ICD-10-CM | POA: Diagnosis not present

## 2023-03-21 DIAGNOSIS — K641 Second degree hemorrhoids: Secondary | ICD-10-CM

## 2023-03-21 DIAGNOSIS — K625 Hemorrhage of anus and rectum: Principal | ICD-10-CM

## 2023-03-21 DIAGNOSIS — K6389 Other specified diseases of intestine: Secondary | ICD-10-CM | POA: Diagnosis not present

## 2023-03-21 DIAGNOSIS — K922 Gastrointestinal hemorrhage, unspecified: Secondary | ICD-10-CM | POA: Diagnosis not present

## 2023-03-21 HISTORY — PX: COLONOSCOPY: SHX5424

## 2023-03-21 LAB — CBC
HCT: 28.9 % — ABNORMAL LOW (ref 39.0–52.0)
Hemoglobin: 9.7 g/dL — ABNORMAL LOW (ref 13.0–17.0)
MCH: 32.7 pg (ref 26.0–34.0)
MCHC: 33.6 g/dL (ref 30.0–36.0)
MCV: 97.3 fL (ref 80.0–100.0)
Platelets: 126 10*3/uL — ABNORMAL LOW (ref 150–400)
RBC: 2.97 MIL/uL — ABNORMAL LOW (ref 4.22–5.81)
RDW: 14.9 % (ref 11.5–15.5)
WBC: 3.5 10*3/uL — ABNORMAL LOW (ref 4.0–10.5)
nRBC: 0 % (ref 0.0–0.2)

## 2023-03-21 LAB — RENAL FUNCTION PANEL
Albumin: 3 g/dL — ABNORMAL LOW (ref 3.5–5.0)
Anion gap: 14 (ref 5–15)
BUN: 48 mg/dL — ABNORMAL HIGH (ref 8–23)
CO2: 25 mmol/L (ref 22–32)
Calcium: 8.7 mg/dL — ABNORMAL LOW (ref 8.9–10.3)
Chloride: 92 mmol/L — ABNORMAL LOW (ref 98–111)
Creatinine, Ser: 11.36 mg/dL — ABNORMAL HIGH (ref 0.61–1.24)
GFR, Estimated: 4 mL/min — ABNORMAL LOW (ref >60)
Glucose, Bld: 80 mg/dL (ref 70–99)
Phosphorus: 4.9 mg/dL — ABNORMAL HIGH (ref 2.5–4.6)
Potassium: 5 mmol/L (ref 3.5–5.1)
Sodium: 131 mmol/L — ABNORMAL LOW (ref 135–145)

## 2023-03-21 SURGERY — COLONOSCOPY
Anesthesia: General

## 2023-03-21 MED ORDER — STERILE WATER FOR IRRIGATION IR SOLN
Status: DC | PRN
  Administered 2023-03-21: 60 mL

## 2023-03-21 MED ORDER — HEPARIN SODIUM (PORCINE) 1000 UNIT/ML DIALYSIS: 1000.0000 [IU] | INTRAMUSCULAR | Status: DC | PRN

## 2023-03-21 MED ORDER — PROPOFOL 10 MG/ML IV BOLUS
INTRAVENOUS | Status: DC | PRN
  Administered 2023-03-21: 90 mg via INTRAVENOUS

## 2023-03-21 MED ORDER — LIDOCAINE HCL (CARDIAC) PF 100 MG/5ML IV SOSY
PREFILLED_SYRINGE | INTRAVENOUS | Status: DC | PRN
  Administered 2023-03-21: 100 mg via INTRAVENOUS

## 2023-03-21 MED ORDER — PROPOFOL 500 MG/50ML IV EMUL
INTRAVENOUS | Status: DC | PRN
  Administered 2023-03-21: 150 ug/kg/min via INTRAVENOUS

## 2023-03-21 MED ORDER — LIDOCAINE-PRILOCAINE 2.5-2.5 % EX CREA
1.0000 | TOPICAL_CREAM | CUTANEOUS | Status: DC | PRN
  Filled 2023-03-21: qty 5

## 2023-03-21 MED ORDER — SODIUM CHLORIDE 0.9 % IV SOLN: INTRAVENOUS | Status: DC

## 2023-03-21 MED ORDER — PENTAFLUOROPROP-TETRAFLUOROETH EX AERO
1.0000 | INHALATION_SPRAY | CUTANEOUS | Status: DC | PRN
  Filled 2023-03-21: qty 30

## 2023-03-21 NOTE — Progress Notes (Signed)
 PROGRESS NOTE    Dwayne Reed  FMW:[id number] DOB: 02/19/1956 DOA: 03/19/2023 PCP: Joshua Cathryne BROCKS, MD  Outpatient Specialists: nephrology    Brief Narrative:   From admission h and p  This is a 68 year old male with past medical history of HLD, hypercalcemia, ESRD [T/Th/Sat], secondary hyperparathyroidism, recurrent nephrolithiasis, COPD, BPH, hyperparathyroidism, gout, HTN, history of prostate cancer.  Today while ultras his wife, he thought he started something wet.  On returning home he found with blood.  On wiping he found clots.  He states this was bright red blood.  His wife made him come to the ER.  The patient denies history of GI bleed.  He denies GERD, NSAID use.  He adds loss of heartburn prior to his food.  His last colonoscopy was approximately 6 years ago.  At that point he was told he had precancerous polyps, he has had no follow-up colonoscopy since   In the ER patient hemodynamically stable.  Hemoglobin 10.7, previous hemoglobin 12.2 done 03/09/2022.  Patient with grossly melanotic stool on examination in the ER, guaiac positive.  [Patient maintained on daily Auryxia  which is p.o. iron ]  Assessment & Plan:   Principal Problem:   GI bleed Active Problems:   COPD (chronic obstructive pulmonary disease) (HCC)   Essential hypertension   ESRD on dialysis (HCC)   Benign prostatic hyperplasia with lower urinary tract symptoms   ESRD on hemodialysis (HCC)   Gout   History of renal cell cancer   Malignant tumor of prostate (HCC)   Anemia in chronic kidney disease   Hyperlipidemia, mixed   Rectal bleeding  # Hematochezia Painless, hemodynamically stable with stable hgb of around 10. Colonoscopy performed today, no source of bleed seen. Did find diverticulosis and internal hemorrhoids so one of those two the most likely etiology. Hgb stable. No bleeding last 24 including with prep - monitor overnight, d/c tomorrow if no further bleeding  # ESRD On tts hemodialysis,  euvolemic - nephrology consulted for maintenance hmodialysis, receiving today  # Restless leg - home requip   # BPH  With hesitancy, frequency.  - flomax  started on admission, continue   DVT prophylaxis: scds Code Status: full Family Communication: none at bedside  Level of care: Telemetry Medical Status is: Inpatient Remains inpatient appropriate because: will monitor overnight    Consultants:  GI, nephrology  Procedures: pending  Antimicrobials:  none    Subjective: No pain, no bleeding  Objective: Vitals:   03/21/23 1528 03/21/23 1543 03/21/23 1546 03/21/23 1600  BP: 124/82  139/76 (!) 141/74  Pulse: 68  73 65  Resp: 15  15 15   Temp: 97.8 F (36.6 C)     TempSrc: Oral     SpO2: 98%  100% 100%  Weight:  77.8 kg    Height:       No intake or output data in the 24 hours ending 03/21/23 1606 Filed Weights   03/18/23 2132 03/21/23 1543  Weight: 78.5 kg 77.8 kg    Examination:  General exam: Appears calm and comfortable  Respiratory system: Clear to auscultation. Respiratory effort normal. Cardiovascular system: S1 & S2 heard, RRR.  Gastrointestinal system: Abdomen is nondistended, soft and nontender.   Central nervous system: Alert and oriented. No focal neurological deficits. Extremities: Symmetric 5 x 5 power. Skin: No rashes, lesions or ulcers Psychiatry: Judgement and insight appear normal. Mood & affect appropriate.     Data Reviewed: I have personally reviewed following labs and imaging studies  CBC: Recent Labs  Lab 03/18/23 2137 03/19/23 0828 03/20/23 0447 03/21/23 0610  WBC 4.1 3.6* 3.8* 3.5*  HGB 10.7* 9.9* 10.0* 9.7*  HCT 33.4* 29.7* 30.9* 28.9*  MCV 103.4* 100.0 100.0 97.3  PLT 120* 132* 120* 126*   Basic Metabolic Panel: Recent Labs  Lab 03/18/23 2137 03/19/23 0647 03/20/23 0447 03/21/23 0610  NA 136 137 135 131*  K 4.2 4.0 4.4 5.0  CL 93* 95* 92* 92*  CO2 29 28 28 25   GLUCOSE 99 95 91 80  BUN 26* 32* 42* 48*   CREATININE 7.10* 7.96* 10.26* 11.36*  CALCIUM  9.3 8.7* 8.8* 8.7*  PHOS  --   --  5.0* 4.9*   GFR: Estimated Creatinine Clearance: 6.8 mL/min (A) (by C-G formula based on SCr of 11.36 mg/dL (H)). Liver Function Tests: Recent Labs  Lab 03/18/23 2137 03/20/23 0447 03/21/23 0610  AST 16  --   --   ALT 17  --   --   ALKPHOS 72  --   --   BILITOT 0.5  --   --   PROT 6.8  --   --   ALBUMIN 3.7 3.3* 3.0*   No results for input(s): LIPASE, AMYLASE in the last 168 hours. No results for input(s): AMMONIA in the last 168 hours. Coagulation Profile: Recent Labs  Lab 03/19/23 0212  INR 0.9   Cardiac Enzymes: No results for input(s): CKTOTAL, CKMB, CKMBINDEX, TROPONINI in the last 168 hours. BNP (last 3 results) No results for input(s): PROBNP in the last 8760 hours. HbA1C: No results for input(s): HGBA1C in the last 72 hours. CBG: No results for input(s): GLUCAP in the last 168 hours. Lipid Profile: No results for input(s): CHOL, HDL, LDLCALC, TRIG, CHOLHDL, LDLDIRECT in the last 72 hours. Thyroid Function Tests: No results for input(s): TSH, T4TOTAL, FREET4, T3FREE, THYROIDAB in the last 72 hours. Anemia Panel: No results for input(s): VITAMINB12, FOLATE, FERRITIN, TIBC, IRON , RETICCTPCT in the last 72 hours. Urine analysis:    Component Value Date/Time   COLORURINE BROWN (A) 02/24/2020 1226   APPEARANCEUR Cloudy (A) 03/18/2020 0955   LABSPEC 1.017 02/24/2020 1226   PHURINE  02/24/2020 1226    TEST NOT REPORTED DUE TO COLOR INTERFERENCE OF URINE PIGMENT   GLUCOSEU Negative 03/18/2020 0955   HGBUR (A) 02/24/2020 1226    TEST NOT REPORTED DUE TO COLOR INTERFERENCE OF URINE PIGMENT   BILIRUBINUR Negative 03/18/2020 0955   KETONESUR (A) 02/24/2020 1226    TEST NOT REPORTED DUE TO COLOR INTERFERENCE OF URINE PIGMENT   PROTEINUR 3+ (A) 03/18/2020 0955   PROTEINUR (A) 02/24/2020 1226    TEST NOT REPORTED DUE TO COLOR  INTERFERENCE OF URINE PIGMENT   NITRITE Negative 03/18/2020 0955   NITRITE (A) 02/24/2020 1226    TEST NOT REPORTED DUE TO COLOR INTERFERENCE OF URINE PIGMENT   LEUKOCYTESUR 3+ (A) 03/18/2020 0955   LEUKOCYTESUR (A) 02/24/2020 1226    TEST NOT REPORTED DUE TO COLOR INTERFERENCE OF URINE PIGMENT   Sepsis Labs: @LABRCNTIP (procalcitonin:4,lacticidven:4)  )No results found for this or any previous visit (from the past 240 hours).       Radiology Studies: No results found.      Scheduled Meds:  Chlorhexidine  Gluconate Cloth  6 each Topical Q0600   nicotine   7 mg Transdermal Daily   pantoprazole  (PROTONIX ) IV  40 mg Intravenous Q12H   rOPINIRole   0.25 mg Oral QHS   tamsulosin   0.4 mg Oral QPC supper   Continuous Infusions:   LOS: 2  days     Devaughn KATHEE Ban, MD [facility]   If 7PM-7AM, please contact night-coverage [email or web address] Password [email or web address] 03/21/2023, 4:06 PM

## 2023-03-21 NOTE — Anesthesia Procedure Notes (Signed)
 Procedure Name: MAC Date/Time: 03/21/2023 12:15 PM  Performed by: Germaine Maeola CROME, CRNAPre-anesthesia Checklist: Patient identified, Emergency Drugs available, Suction available, Patient being monitored and Timeout performed Patient Re-evaluated:Patient Re-evaluated prior to induction Oxygen  Delivery Method: Nasal cannula Induction Type: IV induction Placement Confirmation: positive ETCO2 and CO2 detector

## 2023-03-21 NOTE — Anesthesia Preprocedure Evaluation (Addendum)
 Anesthesia Evaluation  Patient identified by MRN, date of birth, ID band Patient awake    Reviewed: Allergy & Precautions, NPO status , Patient's Chart, lab work & pertinent test results  History of Anesthesia Complications Negative for: history of anesthetic complications  Airway Mallampati: I   Neck ROM: Full    Dental  (+) Missing, Chipped   Pulmonary COPD, Current Smoker (4-5 cigarettes per day)   Pulmonary exam normal breath sounds clear to auscultation       Cardiovascular hypertension, +CHF  Normal cardiovascular exam Rhythm:Regular Rate:Normal  Myocardial perfusion 09/10/21:  Normal treadmill EKG without evidence of ischemia although with some  preventricular contractions at peak stress  Normal myocardial perfusion without evidence of myocardial ischemia   Echo 09/10/21:  NORMAL LEFT VENTRICULAR SYSTOLIC FUNCTION  NORMAL RIGHT VENTRICULAR SYSTOLIC FUNCTION  TRIVIAL REGURGITATION NOTED  NO VALVULAR STENOSIS    Neuro/Psych negative neurological ROS     GI/Hepatic negative GI ROS,,,  Endo/Other  negative endocrine ROS    Renal/GU ESRF and DialysisRenal disease (nephrolithiasis; last HD 03/18/23)   Prostate CA; BPH    Musculoskeletal Gout    Abdominal   Peds  Hematology  (+) Blood dyscrasia, anemia   Anesthesia Other Findings   Reproductive/Obstetrics                             Anesthesia Physical Anesthesia Plan  ASA: 3  Anesthesia Plan: General   Post-op Pain Management:    Induction: Intravenous  PONV Risk Score and Plan: 1 and Propofol  infusion, TIVA and Treatment may vary due to age or medical condition  Airway Management Planned: Natural Airway  Additional Equipment:   Intra-op Plan:   Post-operative Plan:   Informed Consent: I have reviewed the patients History and Physical, chart, labs and discussed the procedure including the risks, benefits and  alternatives for the proposed anesthesia with the patient or authorized representative who has indicated his/her understanding and acceptance.       Plan Discussed with: CRNA  Anesthesia Plan Comments: (LMA/GETA backup discussed.  Patient consented for risks of anesthesia including but not limited to:  - adverse reactions to medications - damage to eyes, teeth, lips or other oral mucosa - nerve damage due to positioning  - sore throat or hoarseness - damage to heart, brain, nerves, lungs, other parts of body or loss of life  Informed patient about role of CRNA in peri- and intra-operative care.  Patient voiced understanding.)        Anesthesia Quick Evaluation

## 2023-03-21 NOTE — Progress Notes (Signed)
 Pt back from colonoscopy. Pt alert x4 but drowsy. Respirations even and unlabored. Pt  vitals stable. Pt has no complaints at this time.

## 2023-03-21 NOTE — Transfer of Care (Signed)
 Immediate Anesthesia Transfer of Care Note  Patient: Dwayne Reed  Procedure(s) Performed: COLONOSCOPY  Patient Location: PACU and Endoscopy Unit  Anesthesia Type:General  Level of Consciousness: drowsy  Airway & Oxygen  Therapy: Patient Spontanous Breathing  Post-op Assessment: Report given to RN and Post -op Vital signs reviewed and stable  Post vital signs: Reviewed and stable  Last Vitals:  Vitals Value Taken Time  BP 104/56 03/21/23 1236  Temp 36.2 C 03/21/23 1236  Pulse 74 03/21/23 1237  Resp 17 03/21/23 1237  SpO2 99 % 03/21/23 1237  Vitals shown include unfiled device data.  Last Pain:  Vitals:   03/21/23 1236  TempSrc: Temporal  PainSc: Asleep         Complications: No notable events documented.

## 2023-03-21 NOTE — Progress Notes (Signed)
 Referring Provider: No ref. provider found Primary Care Physician:  Joshua Cathryne BROCKS, MD Primary Nephrologist:  Dr.   [place] for Consultation: ESRD  HPI: 68 year old male with history of hypertension, hyperlipidemia, COPD, BPH, gout, history of prostate cancer, hypercalcemia, secondary hyperparathyroidism, end-stage renal disease on dialysis on a Tuesday Thursdays and Saturday schedule.    Patient seen at bedside after procedure Drowsy from anesthesia Awaiting meal tray Will receive dialysis later today    Past Medical History:  Diagnosis Date   Anemia    low iron    CHF (congestive heart failure) (HCC)    pt denies   COPD (chronic obstructive pulmonary disease) (HCC)    Dialysis patient (HCC)    History of kidney stones    Hypertension    Kidney stones    Pneumonia    Renal disorder    ESRD on HD (M,W,F)    Past Surgical History:  Procedure Laterality Date   CYSTOSCOPY/URETEROSCOPY/HOLMIUM LASER/STENT PLACEMENT N/A 02/25/2020   Procedure: CYSTOSCOPY/URETEROSCOPY/HOLMIUM LASER/STENT PLACEMENT;  Surgeon: Twylla Glendia BROCKS, MD;  Location: [facility] ORS;  Service: Urology;  Laterality: N/A;   CYSTOSCOPY/URETEROSCOPY/HOLMIUM LASER/STENT PLACEMENT Right 03/09/2020   Procedure: CYSTOSCOPY/URETEROSCOPY/HOLMIUM LASER/STENT Exchange;  Surgeon: Twylla Glendia BROCKS, MD;  Location: [facility] ORS;  Service: Urology;  Laterality: Right;   EXTRACORPOREAL SHOCK WAVE LITHOTRIPSY     LITHOTRIPSY     x 5   PROSTATE BIOPSY      Prior to Admission medications   Medication Sig Start Date End Date Taking? Authorizing Provider  AURYXIA  1 GM 210 MG(Fe) tablet Take 3 tablets (630 mg total) by mouth 3 (three) times daily. 03/19/19  Yes Cleatus Arlyss RAMAN, MD  B Complex-C-Zn-Folic Acid  (DIALYVITE 800-ZINC  15) 0.8 MG TABS SMARTSIG:1 Tablet(s) By Mouth Every Evening   Yes [provider]  hydrALAZINE  (APRESOLINE ) 25 MG tablet Take 25 mg by mouth in the morning and at bedtime. Patient not taking: Reported on  03/19/2023 02/18/23   [provider]  Methoxy PEG-Epoetin  Beta (MIRCERA IJ) every 14 (fourteen) days. Patient not taking: Reported on 03/19/2023 03/18/23 03/16/24  [provider]  nicotine  (NICODERM CQ ) 21 mg/24hr patch Place 1 patch (21 mg total) onto the skin daily. Patient not taking: Reported on 03/19/2023 02/02/23   Joshua Cathryne BROCKS, MD  rOPINIRole  (REQUIP ) 0.25 MG tablet Take 0.25 mg by mouth at bedtime. Patient not taking: Reported on 03/19/2023 02/21/23   [provider]    Current Facility-Administered Medications  Medication Dose Route Frequency Provider Last Rate Last Admin   acetaminophen  (TYLENOL ) tablet 650 mg  650 mg Oral Q6H PRN Crosley, Debby, MD       Or   acetaminophen  (TYLENOL ) suppository 650 mg  650 mg Rectal Q6H PRN Crosley, Debby, MD       Chlorhexidine  Gluconate Cloth 2 % PADS 6 each  6 each Topical Q0600 Druscilla Bald, NP   6 each at 03/21/23 0523   nicotine  (NICODERM CQ  - dosed in mg/24 hr) patch 7 mg  7 mg Transdermal Daily Crosley, Debby, MD   7 mg at 03/21/23 0946   pantoprazole  (PROTONIX ) injection 40 mg  40 mg Intravenous Q12H Ward, Kristen N, DO   40 mg at 03/21/23 0945   rOPINIRole  (REQUIP ) tablet 0.25 mg  0.25 mg Oral QHS Kandis Devaughn Sayres, MD   0.25 mg at 03/20/23 2101   tamsulosin  (FLOMAX ) capsule 0.4 mg  0.4 mg Oral QPC supper Laveda Roosevelt, MD   0.4 mg at 03/20/23 1752    Allergies as  of 03/18/2023 - Review Complete 03/18/2023  Allergen Reaction Noted   Cinacalcet  Other (See Comments) 03/18/2020    Family History  Problem Relation Age of Onset   Hypertension Mother    Heart disease Mother    Sarcoidosis Sister    Cancer Maternal Grandmother        type unknown    Social History   Socioeconomic History   Marital status: Married    Spouse name: Not on file   Number of children: 1   Years of education: Not on file   Highest education level: Not on file  Occupational History   Occupation: admin support speacialist   Tobacco Use   Smoking status: Every Day    Current packs/day: 0.25    Average packs/day: 0.3 packs/day for 30.0 years (7.5 ttl pk-yrs)    Types: Cigarettes   Smokeless tobacco: Never  Vaping Use   Vaping status: Never Used  Substance and Sexual Activity   Alcohol use: Not Currently    Comment: on holidays   Drug use: Never   Sexual activity: Not Currently  Other Topics Concern   Not on file  Social History Narrative   Not on file   Social Drivers of Health   Financial Resource Strain: Low Risk  (09/12/2022)   Overall Financial Resource Strain (CARDIA)    Difficulty of Paying Living Expenses: Not hard at all  Food Insecurity: No Food Insecurity (03/20/2023)   Hunger Vital Sign    Worried About Running Out of Food in the Last Year: Never true    Ran Out of Food in the Last Year: Never true  Transportation Needs: No Transportation Needs (03/20/2023)   PRAPARE - Administrator, Civil Service (Medical): No    Lack of Transportation (Non-Medical): No  Physical Activity: Inactive (09/12/2022)   Exercise Vital Sign    Days of Exercise per Week: 0 days    Minutes of Exercise per Session: 0 min  Stress: No Stress Concern Present (09/12/2022)   [employer] of Occupational Health - Occupational Stress Questionnaire    Feeling of Stress : Not at all  Social Connections: Socially Integrated (03/20/2023)   Social Connection and Isolation Panel [NHANES]    Frequency of Communication with Friends and Family: Three times a week    Frequency of Social Gatherings with Friends and Family: Three times a week    Attends Religious Services: More than 4 times per year    Active Member of Clubs or Organizations: No    Attends Banker Meetings: 1 to 4 times per year    Marital Status: Married  Catering Manager Violence: Not At Risk (03/20/2023)   Humiliation, Afraid, Rape, and Kick questionnaire    Fear of Current or Ex-Partner: No    Emotionally Abused: No    Physically  Abused: No    Sexually Abused: No    Physical Exam: Vital signs in last 24 hours: Temp:  [97.1 F (36.2 C)-98 F (36.7 C)] 97.9 F (36.6 C) (01/07 1400) Pulse Rate:  [63-76] 63 (01/07 1400) Resp:  [15-20] 15 (01/07 1400) BP: (104-136)/(53-82) 123/71 (01/07 1400) SpO2:  [96 %-100 %] 100 % (01/07 1400) Last BM Date : 03/21/23 General:   Alert,  Well-developed, well-nourished, pleasant and cooperative in NAD Head:  Normocephalic and atraumatic. Eyes:  Sclera clear, no icterus.   Conjunctiva pink. Ears:  Normal auditory acuity. Nose:  No deformity, discharge,  or lesions. Lungs:  Clear throughout to auscultation.  No wheezes, crackles, or rhonchi. No acute distress. Heart:  Regular rate and rhythm; no murmurs, clicks, rubs,  or gallops. Abdomen:  Soft, nontender and nondistended.   Extremities:  Without clubbing or edema. Access: Rt AVF  Intake/Output from previous day: No intake/output data recorded. Intake/Output this shift: No intake/output data recorded.  Lab Results: Recent Labs    03/19/23 0828 03/20/23 0447 03/21/23 0610  WBC 3.6* 3.8* 3.5*  HGB 9.9* 10.0* 9.7*  HCT 29.7* 30.9* 28.9*  PLT 132* 120* 126*   BMET Recent Labs    03/19/23 0647 03/20/23 0447 03/21/23 0610  NA 137 135 131*  K 4.0 4.4 5.0  CL 95* 92* 92*  CO2 28 28 25   GLUCOSE 95 91 80  BUN 32* 42* 48*  CREATININE 7.96* 10.26* 11.36*  CALCIUM  8.7* 8.8* 8.7*  PHOS  --  5.0* 4.9*   LFT Recent Labs    03/18/23 2137 03/20/23 0447 03/21/23 0610  PROT 6.8  --   --   ALBUMIN 3.7   < > 3.0*  AST 16  --   --   ALT 17  --   --   ALKPHOS 72  --   --   BILITOT 0.5  --   --    < > = values in this interval not displayed.   PT/INR Recent Labs    03/19/23 0212  LABPROT 12.5  INR 0.9   Hepatitis Panel Recent Labs    03/20/23 1600  HEPBSAG NON REACTIVE    Studies/Results: No results found.  Assessment/Plan:  68 year old male with history of hypertension, hyperlipidemia, COPD, BPH,  gout, history of prostate cancer, hypercalcemia, secondary hyperparathyroidism, end-stage renal disease on dialysis on a Tuesday Thursdays and Saturday schedule.  After he went home from dialysis yesterday he started to have bowel movements with dark bright red blood.   ESRD on hemodialysis: Will plan for dialysis later today. Low UF.   ANEMIA with chronic kidney disease and acute blood loss: Patient has acute GI bleed.  Colonoscopy completed today, 4 polyps removed and no signs of active bleeding.  Secondary hyperparathyroidism: Outpatient labs: PTH 709, phosphorus 5.0, and calcium  9.3 on 02/14/2023.    - Phosphorus and calcium  within desired goal.   Hypertension with chronic kidney disease.  Home regimen includes hydralazine .  Blood pressure stable.      LOS: 2 [facility]

## 2023-03-21 NOTE — Procedures (Signed)
Received patient in bed to unit.  Alert and oriented.  Informed consent signed and in chart.   TX duration: 3.5hrs  Patient tolerated well.  Transported back to the room  Alert, without acute distress.  Hand-off given to patient's nurse.   Access used: Left lower arm AVF. Access issues: NONE  Total UF removed: 1000 ml. Medication(s) given: NONE.    [facility]

## 2023-03-21 NOTE — Op Note (Signed)
 [facility] Gastroenterology Patient Name: Dwayne Reed Procedure Date: 03/21/2023 11:53 AM MRN: [id number] Account #: [id number] Date of Birth: 11-Jan-1956 Admit Type: Inpatient Age: 68 Room: [facility] ENDO ROOM 4 Gender: Male Note Status: Finalized Instrument Name: [id number] Procedure:             Colonoscopy Indications:           Hematochezia Providers:             Rogelia Copping MD, MD Referring MD:          Cathryne KYM Molt, MD (Referring MD) Medicines:             Propofol  per Anesthesia Complications:         No immediate complications. Procedure:             Pre-Anesthesia Assessment:                        - Prior to the procedure, a History and Physical was                         performed, and patient medications and allergies were                         reviewed. The patient's tolerance of previous                         anesthesia was also reviewed. The risks and benefits                         of the procedure and the sedation options and risks                         were discussed with the patient. All questions were                         answered, and informed consent was obtained. Prior                         Anticoagulants: The patient has taken no anticoagulant                         or antiplatelet agents. ASA Grade Assessment: II - A                         patient with mild systemic disease. After reviewing                         the risks and benefits, the patient was deemed in                         satisfactory condition to undergo the procedure.                        After obtaining informed consent, the colonoscope was                         passed under direct vision. Throughout the procedure,  the patient's blood pressure, pulse, and oxygen                          saturations were monitored continuously. The                         Colonoscope was introduced through the anus and                          advanced to the the cecum, identified by appendiceal                         orifice and ileocecal valve. The colonoscopy was                         performed without difficulty. The patient tolerated                         the procedure well. The quality of the bowel                         preparation was excellent. Findings:      The perianal and digital rectal examinations were normal.      A 4 mm polyp was found in the ascending colon. The polyp was sessile.       The polyp was removed with a cold snare. Resection and retrieval were       complete.      Two sessile polyps were found in the transverse colon. The polyps were 4       to 5 mm in size. These polyps were removed with a cold snare. Resection       and retrieval were complete.      A 4 mm polyp was found in the descending colon. The polyp was sessile.       The polyp was removed with a cold snare. Resection and retrieval were       complete.      A few small-mouthed diverticula were found in the entire colon.      Non-bleeding internal hemorrhoids were found during retroflexion. The       hemorrhoids were Grade II (internal hemorrhoids that prolapse but reduce       spontaneously). Impression:            - One 4 mm polyp in the ascending colon, removed with                         a cold snare. Resected and retrieved.                        - Two 4 to 5 mm polyps in the transverse colon,                         removed with a cold snare. Resected and retrieved.                        - One 4 mm polyp in the descending colon, removed with                         a cold  snare. Resected and retrieved.                        - Diverticulosis in the entire examined colon.                        - Non-bleeding internal hemorrhoids. Recommendation:        - Return patient to hospital ward for ongoing care.                        - Resume previous diet.                        - Continue present medications.                         - Await pathology results.                        - If the pathology report reveals adenomatous tissue,                         then repeat the colonoscopy in 5 years. Procedure Code(s):     --- Professional ---                        [id number], Colonoscopy, flexible; with removal of                         tumor(s), polyp(s), or other lesion(s) by snare                         technique Diagnosis Code(s):     --- Professional ---                        K92.1, Melena (includes Hematochezia)                        D12.4, Benign neoplasm of descending colon CPT copyright 2022 American Medical Association. All rights reserved. The codes documented in this report are preliminary and upon coder review may  be revised to meet current compliance requirements. Rogelia Copping MD, MD 03/21/2023 12:34:14 PM This report has been signed electronically. Number of Addenda: 0 Note Initiated On: 03/21/2023 11:53 AM Scope Withdrawal Time: 0 hours 12 minutes 30 seconds  Total Procedure Duration: 0 hours 17 minutes 0 seconds  Estimated Blood Loss:  Estimated blood loss: none.      [facility]

## 2023-03-21 NOTE — Anesthesia Postprocedure Evaluation (Signed)
 Anesthesia Post Note  Patient: Dwayne Reed  Procedure(s) Performed: COLONOSCOPY  Patient location during evaluation: Endoscopy Anesthesia Type: General Level of consciousness: awake and alert Pain management: pain level controlled Vital Signs Assessment: post-procedure vital signs reviewed and stable Respiratory status: spontaneous breathing, nonlabored ventilation, respiratory function stable and patient connected to nasal cannula oxygen  Cardiovascular status: blood pressure returned to baseline and stable Postop Assessment: no apparent nausea or vomiting Anesthetic complications: no   No notable events documented.   Last Vitals:  Vitals:   03/21/23 1249 03/21/23 1300  BP: (!) 107/53 120/72  Pulse: 70 70  Resp: 16 19  Temp:    SpO2: 96% 100%    Last Pain:  Vitals:   03/21/23 1300  TempSrc:   PainSc: 0-No pain                 Dwayne Reed

## 2023-03-21 NOTE — Plan of Care (Signed)

## 2023-03-21 NOTE — TOC CM/SW Note (Signed)
 Transition of Care [facility]) - Inpatient Brief Assessment   Patient Details  Name: CORDNEY BARSTOW MRN: [id number] Date of Birth: 10-Dec-1955  Transition of Care [facility]) CM/SW Contact:    Corean ONEIDA Haddock, RN Phone Number: 03/21/2023, 9:58 AM   Clinical Narrative:   Transition of Care (TOC) Screening Note   Patient Details  Name: Particia DELENA Gaw Date of Birth: June 15, 1955   Transition of Care [facility]) CM/SW Contact:    Corean ONEIDA Haddock, RN Phone Number: 03/21/2023, 9:58 AM    Transition of Care Department [facility]) has reviewed patient and no TOC needs have been identified at this time. . If new patient transition needs arise, please place a TOC consult.     Transition of Care Asessment: Insurance and Status: Insurance coverage has been reviewed Patient has primary care physician: Yes     Prior/Current Home Services: No current home services Social Drivers of Health Review: SDOH reviewed no interventions necessary Readmission risk has been reviewed: Yes Transition of care needs: no transition of care needs at this time

## 2023-03-22 ENCOUNTER — Encounter: Payer: Self-pay | Admitting: Gastroenterology

## 2023-03-22 DIAGNOSIS — K922 Gastrointestinal hemorrhage, unspecified: Secondary | ICD-10-CM | POA: Diagnosis not present

## 2023-03-22 LAB — CBC
HCT: 27.5 % — ABNORMAL LOW (ref 39.0–52.0)
Hemoglobin: 9.4 g/dL — ABNORMAL LOW (ref 13.0–17.0)
MCH: 32.6 pg (ref 26.0–34.0)
MCHC: 34.2 g/dL (ref 30.0–36.0)
MCV: 95.5 fL (ref 80.0–100.0)
Platelets: 126 10*3/uL — ABNORMAL LOW (ref 150–400)
RBC: 2.88 MIL/uL — ABNORMAL LOW (ref 4.22–5.81)
RDW: 14.6 % (ref 11.5–15.5)
WBC: 3.1 10*3/uL — ABNORMAL LOW (ref 4.0–10.5)
nRBC: 0 % (ref 0.0–0.2)

## 2023-03-22 LAB — RENAL FUNCTION PANEL
Albumin: 2.8 g/dL — ABNORMAL LOW (ref 3.5–5.0)
Anion gap: 12 (ref 5–15)
BUN: 25 mg/dL — ABNORMAL HIGH (ref 8–23)
CO2: 29 mmol/L (ref 22–32)
Calcium: 8.5 mg/dL — ABNORMAL LOW (ref 8.9–10.3)
Chloride: 97 mmol/L — ABNORMAL LOW (ref 98–111)
Creatinine, Ser: 7.93 mg/dL — ABNORMAL HIGH (ref 0.61–1.24)
GFR, Estimated: 7 mL/min — ABNORMAL LOW (ref >60)
Glucose, Bld: 60 mg/dL — ABNORMAL LOW (ref 70–99)
Phosphorus: 4.8 mg/dL — ABNORMAL HIGH (ref 2.5–4.6)
Potassium: 4.3 mmol/L (ref 3.5–5.1)
Sodium: 136 mmol/L (ref 135–145)

## 2023-03-22 LAB — SURGICAL PATHOLOGY

## 2023-03-22 MED ORDER — ORAL CARE MOUTH RINSE: 15.0000 mL | OROMUCOSAL | Status: DC | PRN

## 2023-03-22 MED ORDER — TAMSULOSIN HCL 0.4 MG PO CAPS: 0.4000 mg | ORAL_CAPSULE | Freq: Every day | ORAL | 1 refills | Status: DC

## 2023-03-22 NOTE — Progress Notes (Signed)
 Referring Provider: No ref. provider found Primary Care Physician:  Joshua Cathryne BROCKS, MD Primary Nephrologist:  Dr.   [place] for Consultation: ESRD  HPI: 68 year old male with history of hypertension, hyperlipidemia, COPD, BPH, gout, history of prostate cancer, hypercalcemia, secondary hyperparathyroidism, end-stage renal disease on dialysis on a Tuesday Thursdays and Saturday schedule.    Patient resting in bed No complaints to offer Denies pain  Has not has BM since procedure    Past Medical History:  Diagnosis Date   Anemia    low iron    CHF (congestive heart failure) (HCC)    pt denies   COPD (chronic obstructive pulmonary disease) (HCC)    Dialysis patient (HCC)    History of kidney stones    Hypertension    Kidney stones    Pneumonia    Renal disorder    ESRD on HD (M,W,F)    Past Surgical History:  Procedure Laterality Date   COLONOSCOPY N/A 03/21/2023   Procedure: COLONOSCOPY;  Surgeon: Jinny Carmine, MD;  Location: [facility] END[facility]COPY;  Service: Endoscopy;  Laterality: N/A;   CYSTOSCOPY/URETEROSCOPY/HOLMIUM LASER/STENT PLACEMENT N/A 02/25/2020   Procedure: CYSTOSCOPY/URETEROSCOPY/HOLMIUM LASER/STENT PLACEMENT;  Surgeon: Twylla Glendia BROCKS, MD;  Location: [facility] ORS;  Service: Urology;  Laterality: N/A;   CYSTOSCOPY/URETEROSCOPY/HOLMIUM LASER/STENT PLACEMENT Right 03/09/2020   Procedure: CYSTOSCOPY/URETEROSCOPY/HOLMIUM LASER/STENT Exchange;  Surgeon: Twylla Glendia BROCKS, MD;  Location: [facility] ORS;  Service: Urology;  Laterality: Right;   EXTRACORPOREAL SHOCK WAVE LITHOTRIPSY     LITHOTRIPSY     x 5   PROSTATE BIOPSY      Prior to Admission medications   Medication Sig Start Date End Date Taking? Authorizing Provider  AURYXIA  1 GM 210 MG(Fe) tablet Take 3 tablets (630 mg total) by mouth 3 (three) times daily. 03/19/19  Yes Cleatus Arlyss RAMAN, MD  B Complex-C-Zn-Folic Acid  (DIALYVITE 800-ZINC  15) 0.8 MG TABS SMARTSIG:1 Tablet(s) By Mouth Every Evening   Yes [provider]   hydrALAZINE  (APRESOLINE ) 25 MG tablet Take 25 mg by mouth in the morning and at bedtime. Patient not taking: Reported on 03/19/2023 02/18/23   [provider]  Methoxy PEG-Epoetin  Beta (MIRCERA IJ) every 14 (fourteen) days. Patient not taking: Reported on 03/19/2023 03/18/23 03/16/24  [provider]  nicotine  (NICODERM CQ ) 21 mg/24hr patch Place 1 patch (21 mg total) onto the skin daily. Patient not taking: Reported on 03/19/2023 02/02/23   Joshua Cathryne BROCKS, MD  rOPINIRole  (REQUIP ) 0.25 MG tablet Take 0.25 mg by mouth at bedtime. Patient not taking: Reported on 03/19/2023 02/21/23   [provider]    Current Facility-Administered Medications  Medication Dose Route Frequency Provider Last Rate Last Admin   acetaminophen  (TYLENOL ) tablet 650 mg  650 mg Oral Q6H PRN Crosley, Debby, MD       Or   acetaminophen  (TYLENOL ) suppository 650 mg  650 mg Rectal Q6H PRN Crosley, Debby, MD       Chlorhexidine  Gluconate Cloth 2 % PADS 6 each  6 each Topical Q0600 Druscilla Bald, NP   6 each at 03/22/23 0504   nicotine  (NICODERM CQ  - dosed in mg/24 hr) patch 7 mg  7 mg Transdermal Daily Crosley, Debby, MD   7 mg at 03/22/23 1103   Oral care mouth rinse  15 mL Mouth Rinse PRN Wouk, Devaughn Sayres, MD       pantoprazole  (PROTONIX ) injection 40 mg  40 mg Intravenous Q12H Ward, Kristen N, DO   40 mg at 03/22/23 1103   rOPINIRole  (REQUIP ) tablet 0.25 mg  0.25 mg Oral QHS Kandis Devaughn Sayres, MD   0.25 mg at 03/21/23 2116   tamsulosin  (FLOMAX ) capsule 0.4 mg  0.4 mg Oral QPC supper Laveda Roosevelt, MD   0.4 mg at 03/20/23 1752   Current Outpatient Medications  Medication Sig Dispense Refill   AURYXIA  1 GM 210 MG(Fe) tablet Take 3 tablets (630 mg total) by mouth 3 (three) times daily.     B Complex-C-Zn-Folic Acid  (DIALYVITE 800-ZINC  15) 0.8 MG TABS SMARTSIG:1 Tablet(s) By Mouth Every Evening     Methoxy PEG-Epoetin  Beta (MIRCERA IJ) every 14 (fourteen) days. (Patient not taking: Reported on  03/19/2023)     nicotine  (NICODERM CQ ) 21 mg/24hr patch Place 1 patch (21 mg total) onto the skin daily. (Patient not taking: Reported on 03/19/2023) 28 patch 0   rOPINIRole  (REQUIP ) 0.25 MG tablet Take 0.25 mg by mouth at bedtime. (Patient not taking: Reported on 03/19/2023)     tamsulosin  (FLOMAX ) 0.4 MG CAPS capsule Take 1 capsule (0.4 mg total) by mouth daily after supper. 30 capsule 1    Allergies as of 03/18/2023 - Review Complete 03/18/2023  Allergen Reaction Noted   Cinacalcet  Other (See Comments) 03/18/2020    Family History  Problem Relation Age of Onset   Hypertension Mother    Heart disease Mother    Sarcoidosis Sister    Cancer Maternal Grandmother        type unknown    Social History   Socioeconomic History   Marital status: Married    Spouse name: Not on file   Number of children: 1   Years of education: Not on file   Highest education level: Not on file  Occupational History   Occupation: admin support speacialist  Tobacco Use   Smoking status: Every Day    Current packs/day: 0.25    Average packs/day: 0.3 packs/day for 30.0 years (7.5 ttl pk-yrs)    Types: Cigarettes   Smokeless tobacco: Never  Vaping Use   Vaping status: Never Used  Substance and Sexual Activity   Alcohol use: Not Currently    Comment: on holidays   Drug use: Never   Sexual activity: Not Currently  Other Topics Concern   Not on file  Social History Narrative   Not on file   Social Drivers of Health   Financial Resource Strain: Low Risk  (09/12/2022)   Overall Financial Resource Strain (CARDIA)    Difficulty of Paying Living Expenses: Not hard at all  Food Insecurity: No Food Insecurity (03/20/2023)   Hunger Vital Sign    Worried About Running Out of Food in the Last Year: Never true    Ran Out of Food in the Last Year: Never true  Transportation Needs: No Transportation Needs (03/20/2023)   PRAPARE - Administrator, Civil Service (Medical): No    Lack of Transportation  (Non-Medical): No  Physical Activity: Inactive (09/12/2022)   Exercise Vital Sign    Days of Exercise per Week: 0 days    Minutes of Exercise per Session: 0 min  Stress: No Stress Concern Present (09/12/2022)   [employer] of Occupational Health - Occupational Stress Questionnaire    Feeling of Stress : Not at all  Social Connections: Socially Integrated (03/20/2023)   Social Connection and Isolation Panel [NHANES]    Frequency of Communication with Friends and Family: Three times a week    Frequency of Social Gatherings with Friends and Family: Three times a week    Attends Religious Services: More  than 4 times per year    Active Member of Clubs or Organizations: No    Attends Banker Meetings: 1 to 4 times per year    Marital Status: Married  Catering Manager Violence: Not At Risk (03/20/2023)   Humiliation, Afraid, Rape, and Kick questionnaire    Fear of Current or Ex-Partner: No    Emotionally Abused: No    Physically Abused: No    Sexually Abused: No    Physical Exam: Vital signs in last 24 hours: Temp:  [97.9 F (36.6 C)-98.2 F (36.8 C)] 98 F (36.7 C) (01/08 0814) Pulse Rate:  [64-67] 67 (01/08 0814) Resp:  [13-20] 18 (01/08 0814) BP: (115-135)/(65-73) 116/65 (01/08 0814) SpO2:  [95 %-100 %] 98 % (01/08 0814) Weight:  [76.9 kg] 76.9 kg (01/07 1931) Last BM Date : 03/21/23 General:   Alert,  Well-developed, well-nourished, pleasant and cooperative in NAD Head:  Normocephalic and atraumatic. Eyes:  Sclera clear, no icterus.   Conjunctiva pink. Ears:  Normal auditory acuity. Nose:  No deformity, discharge,  or lesions. Lungs:  Clear throughout to auscultation.   No wheezes, crackles, or rhonchi. No acute distress. Heart:  Regular rate and rhythm; no murmurs, clicks, rubs,  or gallops. Abdomen:  Soft, nontender and nondistended.   Extremities:  Without clubbing or edema. Access: Rt AVF  Intake/Output from previous day: 01/07 0701 - 01/08 0700 In: -   Out: 1000  Intake/Output this shift: Total I/O In: 540 [P.O.:540] Out: -   Lab Results: Recent Labs    03/20/23 0447 03/21/23 0610 03/22/23 0436  WBC 3.8* 3.5* 3.1*  HGB 10.0* 9.7* 9.4*  HCT 30.9* 28.9* 27.5*  PLT 120* 126* 126*   BMET Recent Labs    03/20/23 0447 03/21/23 0610 03/22/23 0436  NA 135 131* 136  K 4.4 5.0 4.3  CL 92* 92* 97*  CO2 28 25 29   GLUCOSE 91 80 60*  BUN 42* 48* 25*  CREATININE 10.26* 11.36* 7.93*  CALCIUM  8.8* 8.7* 8.5*  PHOS 5.0* 4.9* 4.8*   LFT Recent Labs    03/22/23 0436  ALBUMIN 2.8*   PT/INR No results for input(s): LABPROT, INR in the last 72 hours.  Hepatitis Panel Recent Labs    03/20/23 1600  HEPBSAG NON REACTIVE    Studies/Results: No results found.  Assessment/Plan:  68 year old male with history of hypertension, hyperlipidemia, COPD, BPH, gout, history of prostate cancer, hypercalcemia, secondary hyperparathyroidism, end-stage renal disease on dialysis on a Tuesday Thursdays and Saturday schedule.  After he went home from dialysis yesterday he started to have bowel movements with dark bright red blood.   ESRD on hemodialysis:Next treatment scheduled for Thursday  ANEMIA with chronic kidney disease and acute blood loss: Patient has acute GI bleed.  Colonoscopy completed today, 4 polyps removed and no signs of active bleeding. Hgb stable today, 9.4  Secondary hyperparathyroidism: Outpatient labs: PTH 709, phosphorus 5.0, and calcium  9.3 on 02/14/2023.    - Will continue to monitor  - No need for intervention at this time  Hypertension with chronic kidney disease.  Home regimen includes hydralazine .  Blood pressure 116/65     LOS: 3 [employer] [facility] [address]

## 2023-03-22 NOTE — Progress Notes (Signed)
Discharge instructions reviewed with the patient. IV removed. Patient sent out via wheelchair to waiting ride 

## 2023-03-22 NOTE — Discharge Summary (Signed)
 Dwayne Reed FMW:[id number] DOB: 1955-05-23 DOA: 03/19/2023  PCP: Joshua Cathryne BROCKS, MD  Admit date: 03/19/2023 Discharge date: 03/22/2023  Time spent: 35 minutes  Recommendations for Outpatient Follow-up:  Pcp f/u     Discharge Diagnoses:  Principal Problem:   Lower GI bleed Active Problems:   COPD (chronic obstructive pulmonary disease) (HCC)   Essential hypertension   ESRD on dialysis (HCC)   Benign prostatic hyperplasia with lower urinary tract symptoms   ESRD on hemodialysis (HCC)   Gout   History of renal cell cancer   Malignant tumor of prostate (HCC)   Anemia in chronic kidney disease   Hyperlipidemia, mixed   Rectal bleeding   Discharge Condition: stable  Diet recommendation: heart healthy  Filed Weights   03/18/23 2132 03/21/23 1543 03/21/23 1931  Weight: 78.5 kg 77.8 kg 76.9 kg    History of present illness:  From admission h and p: This is a 68 year old male with past medical history of HLD, hypercalcemia, ESRD [T/Th/Sat], secondary hyperparathyroidism, recurrent nephrolithiasis, COPD, BPH, hyperparathyroidism, gout, HTN, history of prostate cancer.  Today while ultras his wife, he thought he started something wet.  On returning home he found with blood.  On wiping he found clots.  He states this was bright red blood.  His wife made him come to the ER.  The patient denies history of GI bleed.  He denies GERD, NSAID use.  He adds loss of heartburn prior to his food.  His last colonoscopy was approximately 6 years ago.  At that point he was told he had precancerous polyps, he has had no follow-up colonoscopy since    Hospital Course:  Patient presents with painless hematochezia. This was not severe and resolved by hospital day 2. Hgb drifted from 10s to 9s. Hemodynamically stable. Colonoscopy performed showing no bleeding, did see internal hemorrhoids and diverticulosis, so diverticular or hemorrhoidal bleed most likely. Monitored for 24 hours after colonoscopy with no  bleeding. Maintenance hemodialysis performed. Incidentally patient complains of LUTS (hesitancy, nocturia), so flomax  was trialed and patient requests to continue this outpt. Plan will be PCP f/u, with hospital return precautions, avoid nsaids, etc.   Procedures: colonoscopy   Consultations: GI  Discharge Exam: Vitals:   03/22/23 0451 03/22/23 0814  BP: 122/70 116/65  Pulse: 66 67  Resp: 16 18  Temp: 98.2 F (36.8 C) 98 F (36.7 C)  SpO2: 95% 98%    General: NAD Cardiovascular: RRR Respiratory: CTAB  Discharge Instructions   Discharge Instructions     Diet - low sodium heart healthy   Complete by: As directed    Increase activity slowly   Complete by: As directed       Allergies as of 03/22/2023       Reactions   Cinacalcet  Other (See Comments)   Other reaction(s): Unknown        Medication List     STOP taking these medications    hydrALAZINE  25 MG tablet Commonly known as: APRESOLINE        TAKE these medications    Auryxia  1 GM 210 MG(Fe) tablet Generic drug: ferric citrate  Take 3 tablets (630 mg total) by mouth 3 (three) times daily.   Dialyvite 800-Zinc  15 0.8 MG Tabs SMARTSIG:1 Tablet(s) By Mouth Every Evening   MIRCERA IJ every 14 (fourteen) days.   nicotine  21 mg/24hr patch Commonly known as: Nicoderm CQ  Place 1 patch (21 mg total) onto the skin daily.   rOPINIRole  0.25 MG tablet Commonly known as: REQUIP   Take 0.25 mg by mouth at bedtime.   tamsulosin  0.4 MG Caps capsule Commonly known as: FLOMAX  Take 1 capsule (0.4 mg total) by mouth daily after supper.       Allergies  Allergen Reactions   Cinacalcet  Other (See Comments)    Other reaction(s): Unknown    Follow-up Information     Joshua Cathryne BROCKS, MD Follow up.   Specialty: Family Medicine Contact information: [address] [place] [address] [phone]                  The results of significant diagnostics from this hospitalization  (including imaging, microbiology, ancillary and laboratory) are listed below for reference.    Significant Diagnostic Studies: No results found.  Microbiology: No results found for this or any previous visit (from the past 240 hours).   Labs: Basic Metabolic Panel: Recent Labs  Lab 03/18/23 2137 03/19/23 0647 03/20/23 0447 03/21/23 0610 03/22/23 0436  NA 136 137 135 131* 136  K 4.2 4.0 4.4 5.0 4.3  CL 93* 95* 92* 92* 97*  CO2 29 28 28 25 29   GLUCOSE 99 95 91 80 60*  BUN 26* 32* 42* 48* 25*  CREATININE 7.10* 7.96* 10.26* 11.36* 7.93*  CALCIUM  9.3 8.7* 8.8* 8.7* 8.5*  PHOS  --   --  5.0* 4.9* 4.8*   Liver Function Tests: Recent Labs  Lab 03/18/23 2137 03/20/23 0447 03/21/23 0610 03/22/23 0436  AST 16  --   --   --   ALT 17  --   --   --   ALKPHOS 72  --   --   --   BILITOT 0.5  --   --   --   PROT 6.8  --   --   --   ALBUMIN 3.7 3.3* 3.0* 2.8*   No results for input(s): LIPASE, AMYLASE in the last 168 hours. No results for input(s): AMMONIA in the last 168 hours. CBC: Recent Labs  Lab 03/18/23 2137 03/19/23 0828 03/20/23 0447 03/21/23 0610 03/22/23 0436  WBC 4.1 3.6* 3.8* 3.5* 3.1*  HGB 10.7* 9.9* 10.0* 9.7* 9.4*  HCT 33.4* 29.7* 30.9* 28.9* 27.5*  MCV 103.4* 100.0 100.0 97.3 95.5  PLT 120* 132* 120* 126* 126*   Cardiac Enzymes: No results for input(s): CKTOTAL, CKMB, CKMBINDEX, TROPONINI in the last 168 hours. BNP: BNP (last 3 results) No results for input(s): BNP in the last 8760 hours.  ProBNP (last 3 results) No results for input(s): PROBNP in the last 8760 hours.  CBG: No results for input(s): GLUCAP in the last 168 hours.     Signed:  Devaughn KATHEE Ban MD  [facility] 03/22/2023, 12:19 PM

## 2023-03-22 NOTE — Plan of Care (Signed)

## 2023-03-23 ENCOUNTER — Encounter: Payer: Self-pay | Admitting: Gastroenterology

## 2023-03-23 ENCOUNTER — Telehealth: Payer: Self-pay

## 2023-03-23 NOTE — Transitions of Care (Post Inpatient/ED Visit) (Signed)
   03/23/2023  Name: DEMARIOUS KAPUR MRN: [id number] DOB: 1955-07-24  Today's TOC FU Call Status: Today's TOC FU Call Status:: Unsuccessful Call (1st Attempt) Unsuccessful Call (1st Attempt) Date: 03/23/23  Attempted to reach the patient regarding the most recent Inpatient/ED visit.Lm with contact info for patient to call back   Follow Up Plan: Additional outreach attempts will be made to reach the patient to complete the Transitions of Care (Post Inpatient/ED visit) call.   Bari Mayans , BSN, RN Care Management Coordinator [place]   [facility] [email or web address] Direct Dial : [phone]

## 2023-03-24 ENCOUNTER — Telehealth: Payer: Self-pay

## 2023-03-24 NOTE — Transitions of Care (Post Inpatient/ED Visit) (Signed)
   03/24/2023  Name: Dwayne Reed MRN: [id number] DOB: [birth year]  Today's TOC FU Call Status: Today's TOC FU Call Status:: Unsuccessful Call (2nd Attempt) Unsuccessful Call (1st Attempt) Date: 03/23/23 Unsuccessful Call (2nd Attempt) Date: 03/24/23  Attempted to reach the patient regarding the most recent Inpatient/ED visit. LM with contact info requesting a call back   Follow Up Plan: Additional outreach attempts will be made to reach the patient to complete the Transitions of Care (Post Inpatient/ED visit) call.    Bari Mayans , BSN, RN Care Management Coordinator [place]   [facility] [email or web address] Direct Dial : [phone]

## 2023-03-27 ENCOUNTER — Telehealth: Payer: Self-pay

## 2023-03-27 NOTE — Transitions of Care (Post Inpatient/ED Visit) (Signed)
 03/27/2023  Name: Dwayne Reed MRN: [id number] DOB: Oct 07, 1955  Today's TOC FU Call Status: Today's TOC FU Call Status:: Successful TOC FU Call Completed Unsuccessful Call (1st Attempt) Date: 03/23/23 Unsuccessful Call (2nd Attempt) Date: 03/24/23 [facility] FU Call Complete Date: 03/27/23 Patient's Name and Date of Birth confirmed.  Transition Care Management Follow-up Telephone Call Date of Discharge: 03/22/23 Discharge Facility: [facility]) Type of Discharge: Inpatient Admission Primary Inpatient Discharge Diagnosis:: Lower GI bleed How have you been since you were released from the hospital?: Better Any questions or concerns?: No  Items Reviewed: Did you receive and understand the discharge instructions provided?: Yes Medications obtained,verified, and reconciled?: Yes (Medications Reviewed) Any new allergies since your discharge?: No Dietary orders reviewed?: Yes Type of Diet Ordered:: Reg Heart Healthy Do you have support at home?: Yes People in Home: spouse Name of Support/Comfort Primary Source: [name]  Medications Reviewed Today: Medications Reviewed Today     Reviewed by Willma Camelia CROME, RN (Registered Nurse) on 03/27/23 at 1326  Med List Status: <None>   Medication Order Taking? Sig Documenting Provider Last Dose Status Informant  AURYXIA  1 GM 210 MG(Fe) tablet [id number] Yes Take 3 tablets (630 mg total) by mouth 3 (three) times daily. Cleatus Arlyss RAMAN, MD Taking Active Self, Pharmacy Records  B Complex-C-Zn-Folic Acid  (DIALYVITE 800-ZINC  15) 0.8 MG TABS [id number] Yes SMARTSIG:1 Tablet(s) By Mouth Every Evening [provider] Taking Active Self, Pharmacy Records  Methoxy PEG-Epoetin  Beta [facility] IJ) [id number]  every 14 (fourteen) days.  Patient not taking: Reported on 03/19/2023   [provider]  Active Self, Pharmacy Records  nicotine  (NICODERM CQ ) 21 mg/24hr patch [id number] No Place 1 patch (21 mg total) onto the skin  daily.  Patient not taking: Reported on 03/19/2023   Joshua Cathryne BROCKS, MD Not Taking Active Self, Pharmacy Records  rOPINIRole  (REQUIP ) 0.25 MG tablet [id number] No Take 0.25 mg by mouth at bedtime.  Patient not taking: Reported on 03/19/2023   [provider] Not Taking Active Self, Pharmacy Records  tamsulosin  (FLOMAX ) 0.4 MG CAPS capsule [id number] Yes Take 1 capsule (0.4 mg total) by mouth daily after supper. Wouk, Devaughn Sayres, MD Taking Active           Medication reconciliation / review completed based on most recent discharge summary and EHR medication list. Confirmed patient is taking all newly prescribed medications as instructed and is aware of any changes to and / or dosage adjustments to medication regimen. Patient denies questions at this time and reports no barriers to medication adherence.   Home Care and Equipment/Supplies: Were Home Health Services Ordered?: No Any new equipment or medical supplies ordered?: No  Functional Questionnaire: Do you need assistance with bathing/showering or dressing?: No Do you need assistance with meal preparation?: No Do you need assistance with eating?: No Do you have difficulty maintaining continence: No Do you need assistance with getting out of bed/getting out of a chair/moving?: No Do you have difficulty managing or taking your medications?: No  Follow up appointments reviewed: PCP Follow-up appointment confirmed?: Yes Date of PCP follow-up appointment?: 04/06/23 Follow-up Provider: Cathryne Joshua Specialist [facility] Follow-up appointment confirmed?: NA Do you need transportation to your follow-up appointment?: No (He drives)  SDOH Interventions Today    Flowsheet Row Most Recent Value  SDOH Interventions   Food Insecurity Interventions Intervention Not Indicated  Housing Interventions Intervention Not Indicated  Transportation Interventions Intervention Not Indicated, Patient Resources (Friends/Family)  Utilities  Interventions Intervention Not Indicated  Social Connections Interventions Intervention Not Indicated      Interventions Today    Flowsheet Row Most Recent Value  General Interventions   General Interventions Discussed/Reviewed General Interventions Discussed, Doctor Visits  Doctor Visits Discussed/Reviewed PCP, Specialist  PCP/Specialist Visits Compliance with follow-up visit  Nutrition Interventions   Nutrition Discussed/Reviewed Nutrition Reviewed  Pharmacy Interventions   Pharmacy Dicussed/Reviewed Medications and their functions, Medication Adherence         Discussed [facility]  TOC program and weekly calls to patient to assess condition/status, medication management  and provide support/education as indicated . Patient/ Caregiver voiced understanding and declined enrollment in the 30-day TOC Program.   Patient stated his GI bleed was related to recent colonoscopy and polyp removal. He is currently at work and back to usual routine   The patient has been provided with contact information for the care management team and has been advised to call with any health related questions or concerns.    Bari Mayans , BSN, RN Care Management Coordinator [place]   [facility] [email or web address] Direct Dial : [phone]

## 2023-04-06 ENCOUNTER — Inpatient Hospital Stay: Payer: Medicare Other | Admitting: Family Medicine

## 2023-04-11 ENCOUNTER — Encounter: Payer: Self-pay | Admitting: Family Medicine

## 2023-04-11 ENCOUNTER — Ambulatory Visit (INDEPENDENT_AMBULATORY_CARE_PROVIDER_SITE_OTHER): Payer: 59 | Admitting: Family Medicine

## 2023-04-11 VITALS — BP 124/62 | HR 60 | Ht 72.0 in | Wt 172.0 lb

## 2023-04-11 DIAGNOSIS — I10 Essential (primary) hypertension: Secondary | ICD-10-CM

## 2023-04-11 DIAGNOSIS — E782 Mixed hyperlipidemia: Secondary | ICD-10-CM

## 2023-04-11 DIAGNOSIS — F17210 Nicotine dependence, cigarettes, uncomplicated: Secondary | ICD-10-CM | POA: Insufficient documentation

## 2023-04-11 DIAGNOSIS — I700 Atherosclerosis of aorta: Secondary | ICD-10-CM | POA: Insufficient documentation

## 2023-04-11 NOTE — Patient Instructions (Signed)

## 2023-04-11 NOTE — Progress Notes (Signed)
Date:  04/11/2023   Name:  Dwayne Reed   DOB:  18-Jul-1955   MRN:  [id number]   Chief Complaint: Hospitalization Follow-up  Hypertension This is a chronic problem. The current episode started more than 1 year ago. The problem has been waxing and waning since onset. The problem is controlled. Pertinent negatives include no anxiety, blurred vision, chest pain, headaches, malaise/fatigue, neck pain, orthopnea, palpitations, peripheral edema, PND, shortness of breath or sweats. There are no associated agents to hypertension. Risk factors for coronary artery disease include dyslipidemia.    Lab Results  Component Value Date   NA 136 03/22/2023   K 4.3 03/22/2023   CO2 29 03/22/2023   GLUCOSE 60 (L) 03/22/2023   BUN 25 (H) 03/22/2023   CREATININE 7.93 (H) 03/22/2023   CALCIUM 8.5 (L) 03/22/2023   EGFR 5 (L) 03/09/2022   GFRNONAA 7 (L) 03/22/2023   Lab Results  Component Value Date   CHOL 195 03/09/2022   HDL 47 03/09/2022   LDLCALC 129 (H) 03/09/2022   LDLDIRECT 152.0 03/27/2019   TRIG 105 03/09/2022   CHOLHDL 4.7 09/27/2019   No results found for: "TSH" No results found for: "HGBA1C" Lab Results  Component Value Date   WBC 3.1 (L) 03/22/2023   HGB 9.4 (L) 03/22/2023   HCT 27.5 (L) 03/22/2023   MCV 95.5 03/22/2023   PLT 126 (L) 03/22/2023   Lab Results  Component Value Date   ALT 17 03/18/2023   AST 16 03/18/2023   ALKPHOS 72 03/18/2023   BILITOT 0.5 03/18/2023   Lab Results  Component Value Date   VD25OH 34 09/27/2019     Review of Systems  Constitutional:  Negative for chills, fever and malaise/fatigue.  HENT:  Negative for drooling, ear discharge, ear pain and sore throat.   Eyes:  Negative for blurred vision.  Respiratory:  Negative for cough, shortness of breath and wheezing.   Cardiovascular:  Negative for chest pain, palpitations, orthopnea, leg swelling and PND.  Gastrointestinal:  Negative for abdominal pain, blood in stool, constipation, diarrhea  and nausea.  Endocrine: Negative for polydipsia.  Genitourinary:  Negative for dysuria, frequency, hematuria and urgency.  Musculoskeletal:  Negative for back pain, myalgias and neck pain.  Skin:  Negative for rash.  Allergic/Immunologic: Negative for environmental allergies.  Neurological:  Negative for dizziness and headaches.  Hematological:  Does not bruise/bleed easily.  Psychiatric/Behavioral:  Negative for suicidal ideas. The patient is not nervous/anxious.     Patient Active Problem List   Diagnosis Date Noted   Rectal bleeding 03/21/2023   Lower GI bleed 03/19/2023   Allergy, unspecified, initial encounter 11/18/2021   Anaphylactic shock, unspecified, initial encounter 11/18/2021   Abnormal ECG 08/19/2021   Hyperlipidemia, mixed 08/19/2021   Hypercalcemia 09/23/2020   Urinary tract infection with hematuria    Hydronephrosis with renal and ureteral calculus obstruction 02/24/2020   Breakdown of surgically created AV fistula, init (HCC) 10/23/2017   Decreased energy 10/23/2017   Mild protein-calorie malnutrition (HCC) 08/18/2017   Anemia in chronic kidney disease 07/29/2017   Coagulation defect, unspecified (HCC) 07/29/2017   Iron deficiency anemia, unspecified 07/29/2017   Secondary hyperparathyroidism of renal origin (HCC) 07/29/2017   ESRD on dialysis (HCC) 07/26/2017   SOB (shortness of breath) 07/25/2017   Recurrent nephrolithiasis 06/21/2017   Pneumonia 05/18/2017   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   COPD (chronic obstructive pulmonary disease) (HCC) 07/14/2016   History of renal cell cancer 07/12/2016   Olecranon bursitis 05/27/2016  Malignant tumor of prostate (HCC) 09/18/2015   Benign prostatic hyperplasia with lower urinary tract symptoms 08/05/2015   Pre-transplant evaluation for kidney transplant 08/05/2015   Acquired arteriovenous fistula (HCC) 04/30/2015   ESRD on hemodialysis (HCC) 02/04/2015   Hyperparathyroidism (HCC) 02/04/2015    Hyperphosphatemia 02/04/2015   Anemia of renal disease 02/04/2015   History of transfusion 12/13/2014   Elevated prostate specific antigen (PSA) 08/18/2012   Essential hypertension 08/18/2012   Gout 08/18/2012   Acquired cyst of kidney 04/13/2012    Allergies  Allergen Reactions   Cinacalcet Other (See Comments)    Other reaction(s): Unknown    Past Surgical History:  Procedure Laterality Date   COLONOSCOPY N/A 03/21/2023   Procedure: COLONOSCOPY;  Surgeon: Midge Minium, MD;  Location: [facility];  Service: Endoscopy;  Laterality: N/A;   CYSTOSCOPY/URETEROSCOPY/HOLMIUM LASER/STENT PLACEMENT N/A 02/25/2020   Procedure: CYSTOSCOPY/URETEROSCOPY/HOLMIUM LASER/STENT PLACEMENT;  Surgeon: Riki Altes, MD;  Location: [facility] ORS;  Service: Urology;  Laterality: N/A;   CYSTOSCOPY/URETEROSCOPY/HOLMIUM LASER/STENT PLACEMENT Right 03/09/2020   Procedure: CYSTOSCOPY/URETEROSCOPY/HOLMIUM LASER/STENT Exchange;  Surgeon: Riki Altes, MD;  Location: [facility] ORS;  Service: Urology;  Laterality: Right;   EXTRACORPOREAL SHOCK WAVE LITHOTRIPSY     LITHOTRIPSY     x 5   PROSTATE BIOPSY      Social History   Tobacco Use   Smoking status: Every Day    Current packs/day: 0.25    Average packs/day: 0.3 packs/day for 30.0 years (7.5 ttl pk-yrs)    Types: Cigarettes   Smokeless tobacco: Never  Vaping Use   Vaping status: Never Used  Substance Use Topics   Alcohol use: Not Currently    Comment: on holidays   Drug use: Never     Medication list has been reviewed and updated.  Current Meds  Medication Sig   AURYXIA 1 GM 210 MG(Fe) tablet Take 3 tablets (630 mg total) by mouth 3 (three) times daily.   B Complex-C-Zn-Folic Acid (DIALYVITE 800-ZINC 15) 0.8 MG TABS SMARTSIG:1 Tablet(s) By Mouth Every Evening   cholecalciferol (VITAMIN D3) 25 MCG (1000 UNIT) tablet Take 1,000 Units by mouth daily.   Methoxy PEG-Epoetin Beta (MIRCERA IJ) every 14 (fourteen) days.   nicotine (NICODERM CQ) 21  mg/24hr patch Place 1 patch (21 mg total) onto the skin daily.   rOPINIRole (REQUIP) 0.25 MG tablet Take 0.25 mg by mouth at bedtime.   tamsulosin (FLOMAX) 0.4 MG CAPS capsule Take 1 capsule (0.4 mg total) by mouth daily after supper.       04/11/2023    3:10 PM 02/02/2023    1:31 PM 09/12/2022    1:41 PM 03/09/2022    8:50 AM  GAD 7 : Generalized Anxiety Score  Nervous, Anxious, on Edge 0 0 0 0  Control/stop worrying 0 0 0 0  Worry too much - different things 0 0 0 0  Trouble relaxing 0 0 0 0  Restless  0 0 0  Easily annoyed or irritable 0 0 0 0  Afraid - awful might happen 0 0 0 0  Total GAD 7 Score  0 0 0  Anxiety Difficulty Not difficult at all Not difficult at all Not difficult at all Not difficult at all       04/11/2023    3:10 PM 02/02/2023    1:31 PM 09/12/2022    1:41 PM  Depression screen PHQ 2/9  Decreased Interest 0 0 0  Down, Depressed, Hopeless 0 0 0  PHQ - 2 Score 0 0  0  Altered sleeping  0 0  Tired, decreased energy  0 0  Change in appetite  0 0  Feeling bad or failure about yourself   0 0  Trouble concentrating  0 0  Moving slowly or fidgety/restless  0 0  Suicidal thoughts  0 0  PHQ-9 Score  0 0  Difficult doing work/chores  Not difficult at all Not difficult at all    BP Readings from Last 3 Encounters:  04/11/23 124/62  03/22/23 116/65  02/02/23 (!) 112/58    Physical Exam Vitals and nursing note reviewed.  Constitutional:      Appearance: He is well-developed.  HENT:     Head: Normocephalic and atraumatic.     Right Ear: Tympanic membrane, ear canal and external ear normal.     Left Ear: Tympanic membrane, ear canal and external ear normal.     Nose: Nose normal.     Mouth/Throat:     Mouth: Mucous membranes are moist.     Dentition: Normal dentition.  Eyes:     General: Lids are normal. No scleral icterus.    Conjunctiva/sclera: Conjunctivae normal.     Pupils: Pupils are equal, round, and reactive to light.  Neck:     Thyroid: No  thyromegaly.     Vascular: No carotid bruit, hepatojugular reflux or JVD.     Trachea: No tracheal deviation.  Cardiovascular:     Rate and Rhythm: Normal rate and regular rhythm.     Heart sounds: Normal heart sounds.  Pulmonary:     Effort: Pulmonary effort is normal.     Breath sounds: Normal breath sounds. No wheezing, rhonchi or rales.  Abdominal:     General: Bowel sounds are normal.     Palpations: Abdomen is soft. There is no hepatomegaly, splenomegaly or mass.     Tenderness: There is no abdominal tenderness.     Hernia: There is no hernia in the left inguinal area.  Musculoskeletal:        General: Normal range of motion.     Cervical back: Normal range of motion and neck supple.  Lymphadenopathy:     Cervical: No cervical adenopathy.  Skin:    General: Skin is warm and dry.     Findings: No rash.  Neurological:     Mental Status: He is alert and oriented to person, place, and time.     Sensory: No sensory deficit.     Deep Tendon Reflexes: Reflexes are normal and symmetric.  Psychiatric:        Mood and Affect: Mood is not anxious or depressed.     Wt Readings from Last 3 Encounters:  04/11/23 172 lb (78 kg)  03/21/23 169 lb 8.5 oz (76.9 kg)  02/02/23 173 lb (78.5 kg)    BP 124/62   Pulse 60   Ht 6' (1.829 m)   Wt 172 lb (78 kg)   BMI 23.33 kg/m   Assessment and Plan: 1. Hyperlipidemia, mixed (Primary) Chronic.  Uncontrolled.  Stable.  Patient states that he eats a good diet although what he states with sausage eggs and bacon is probably not the optimal for low-cholesterol low triglyceride guidelines.  We will check lipid panel to see what his current status is and we will treat accordingly.  In the meantime I have provided low-cholesterol low dietary guidelines. - Lipid Panel With LDL/HDL Ratio  2. Cigarette nicotine dependence without complication Patient has been advised of the health risks of smoking and  counseled concerning cessation of tobacco  products. I spent over 3 minutes for discussion and to answer questions.  This is to reduce his risk of stroke and heart attack I have asked patient to consider quitting smoking entirely instead of smoking his customary 5 cigarettes a day which would put him over 40 years at less than a 20-pack-year history and would not qualify him for screening.  3. Aortic atherosclerosis (HCC) Chronic.  Controlled.  Stable.  In addition to lowering blood pressure I have discussed with patient cessation of smoking as well as a low-cholesterol low triglyceride dietary intake.  4. Essential hypertension Chronic.  Controlled.  Stable.  Blood pressure today is 124/62.  Asymptomatic.  Tolerating treatment well and that he is just avoiding sodium and at this time I would concur that this is probably the best approach we can hope with Mr. Vowels.    Elizabeth Sauer, MD

## 2023-04-12 LAB — LIPID PANEL WITH LDL/HDL RATIO
Cholesterol, Total: 206 mg/dL — ABNORMAL HIGH (ref 100–199)
HDL: 51 mg/dL (ref >39)
LDL Chol Calc (NIH): 116 mg/dL — ABNORMAL HIGH (ref 0–99)
LDL/HDL Ratio: 2.3 {ratio} (ref 0.0–3.6)
Triglycerides: 226 mg/dL — ABNORMAL HIGH (ref 0–149)
VLDL Cholesterol Cal: 39 mg/dL (ref 5–40)

## 2023-04-15 DIAGNOSIS — Z992 Dependence on renal dialysis: Secondary | ICD-10-CM | POA: Diagnosis not present

## 2023-04-15 DIAGNOSIS — N186 End stage renal disease: Secondary | ICD-10-CM | POA: Diagnosis not present

## 2023-04-15 DIAGNOSIS — I129 Hypertensive chronic kidney disease with stage 1 through stage 4 chronic kidney disease, or unspecified chronic kidney disease: Secondary | ICD-10-CM | POA: Diagnosis not present

## 2023-06-28 ENCOUNTER — Encounter: Payer: Self-pay | Admitting: Podiatry

## 2023-06-28 ENCOUNTER — Ambulatory Visit (INDEPENDENT_AMBULATORY_CARE_PROVIDER_SITE_OTHER): Admitting: Podiatry

## 2023-06-28 DIAGNOSIS — B351 Tinea unguium: Secondary | ICD-10-CM

## 2023-06-28 DIAGNOSIS — M79674 Pain in right toe(s): Secondary | ICD-10-CM

## 2023-06-28 DIAGNOSIS — N186 End stage renal disease: Secondary | ICD-10-CM

## 2023-06-28 DIAGNOSIS — Z992 Dependence on renal dialysis: Secondary | ICD-10-CM | POA: Diagnosis not present

## 2023-06-28 DIAGNOSIS — D689 Coagulation defect, unspecified: Secondary | ICD-10-CM

## 2023-06-28 DIAGNOSIS — L84 Corns and callosities: Secondary | ICD-10-CM | POA: Diagnosis not present

## 2023-06-28 DIAGNOSIS — M79675 Pain in left toe(s): Secondary | ICD-10-CM | POA: Diagnosis not present

## 2023-06-28 DIAGNOSIS — Q828 Other specified congenital malformations of skin: Secondary | ICD-10-CM

## 2023-07-04 NOTE — Progress Notes (Signed)
  Subjective:  Patient ID: Dwayne Reed, male    DOB: [birth year],  MRN: [id number]  68 y.o. male presents at risk foot care. Patient has h/o ESRD on hemodialysis and corn(s)  right foot, porokeratotic lesion(s) of both feet and painful mycotic nails. Painful toenails interfere with ambulation. Aggravating factors include wearing enclosed shoe gear. Pain is relieved with periodic professional debridement. Painful corns and porokeratotic lesion(s) aggravated when weightbearing with and without shoegear. Pain is relieved with periodic professional debridement.  Chief Complaint  Patient presents with   Nail Problem    "I need everything trimmed; corns, toenails, and calluses."    New problem(s): None   PCP is Clarise Crooks, MD , and last visit was February 02, 2023.  Allergies  Allergen Reactions   Cinacalcet  Other (See Comments)    Other reaction(s): Unknown    Review of Systems: Negative except as noted in the HPI.   Objective:  SHANE BADEAUX is a pleasant 68 y.o. male WD, WN in NAD. AAO x 3.  Vascular Examination: CFT <3 seconds b/l LE. Palpable pedal pulses b/l LE. Pedal hair absent. No pain with calf compression b/l. Lower extremity skin temperature gradient within normal limits. No edema noted b/l LE. No cyanosis or clubbing noted b/l LE.  Neurological Examination: Sensation grossly intact b/l with 10 gram monofilament. Vibratory sensation intact b/l.  Dermatological Examination: Pedal skin is warm and supple b/l.  No open wounds b/l lower extremities. No interdigital macerations b/l lower extremities.   Toenails 1-5 b/l elongated, discolored, dystrophic, thickened, crumbly with subungual debris and tenderness to dorsal palpation.   Hyperkeratotic lesion(s) R 2nd toe, submet head 1 left foot.   Porokeratotic lesions submet head  2 b/l. No erythema, no edema, no drainage, no fluctuance.  Musculoskeletal Examination: Normal muscle strength 5/5 to all lower extremity  muscle groups bilaterally. Patient ambulates independent of any assistive aids. HAV with bunion deformity noted b/l LE. Hammertoe deformity noted 2-5 b/l.  Radiographs: None  Last A1c:       No data to display           Assessment:   1. Pain due to onychomycosis of toenails of both feet   2. Corns   3. Porokeratosis   4. Coagulation defect, unspecified (HCC)   5. ESRD on dialysis [facility])    Plan:  -Consent given for treatment as described below: -Examined patient. -Mycotic toenails 1-5 bilaterally were debrided in length and girth with sterile nail nippers and dremel without incident. -Corn(s) R 2nd toe pared utilizing sterile scalpel blade without complication or incident. Total number debrided=1. -Porokeratotic lesion(s) submet head 2 b/l pared and enucleated with sterile currette without incident. Total number of lesions debrided=2. -Patient/POA to call should there be question/concern in the interim.  Return in about 9 weeks (around 08/30/2023).  Luella Sager, DPM      [place] LOCATION: [address], [place] [address]                   Office [phone]   [facility] LOCATION: [address] [place], [place] [address] Office [phone]

## 2023-08-30 ENCOUNTER — Other Ambulatory Visit: Payer: Self-pay

## 2023-08-30 ENCOUNTER — Emergency Department
Admission: EM | Admit: 2023-08-30 | Discharge: 2023-08-30 | Disposition: A | Discharge status: Home / Self Care | Attending: Emergency Medicine | Admitting: Emergency Medicine

## 2023-08-30 DIAGNOSIS — J449 Chronic obstructive pulmonary disease, unspecified: Secondary | ICD-10-CM | POA: Insufficient documentation

## 2023-08-30 DIAGNOSIS — N186 End stage renal disease: Secondary | ICD-10-CM | POA: Diagnosis not present

## 2023-08-30 DIAGNOSIS — K625 Hemorrhage of anus and rectum: Secondary | ICD-10-CM

## 2023-08-30 DIAGNOSIS — D631 Anemia in chronic kidney disease: Secondary | ICD-10-CM | POA: Diagnosis not present

## 2023-08-30 DIAGNOSIS — K644 Residual hemorrhoidal skin tags: Secondary | ICD-10-CM | POA: Insufficient documentation

## 2023-08-30 LAB — CBC
HCT: 30.7 % — ABNORMAL LOW (ref 39.0–52.0)
Hemoglobin: 10.2 g/dL — ABNORMAL LOW (ref 13.0–17.0)
MCH: 32.5 pg (ref 26.0–34.0)
MCHC: 33.2 g/dL (ref 30.0–36.0)
MCV: 97.8 fL (ref 80.0–100.0)
Platelets: 132 10*3/uL — ABNORMAL LOW (ref 150–400)
RBC: 3.14 MIL/uL — ABNORMAL LOW (ref 4.22–5.81)
RDW: 15.9 % — ABNORMAL HIGH (ref 11.5–15.5)
WBC: 4.3 10*3/uL (ref 4.0–10.5)
nRBC: 0 % (ref 0.0–0.2)

## 2023-08-30 LAB — COMPREHENSIVE METABOLIC PANEL WITH GFR
ALT: 16 U/L (ref 0–44)
AST: 12 U/L — ABNORMAL LOW (ref 15–41)
Albumin: 3.5 g/dL (ref 3.5–5.0)
Alkaline Phosphatase: 68 U/L (ref 38–126)
Anion gap: 14 (ref 5–15)
BUN: 45 mg/dL — ABNORMAL HIGH (ref 8–23)
CO2: 27 mmol/L (ref 22–32)
Calcium: 9.3 mg/dL (ref 8.9–10.3)
Chloride: 96 mmol/L — ABNORMAL LOW (ref 98–111)
Creatinine, Ser: 10.26 mg/dL — ABNORMAL HIGH (ref 0.61–1.24)
GFR, Estimated: 5 mL/min — ABNORMAL LOW (ref >60)
Glucose, Bld: 88 mg/dL (ref 70–99)
Potassium: 3.9 mmol/L (ref 3.5–5.1)
Sodium: 137 mmol/L (ref 135–145)
Total Bilirubin: 1 mg/dL (ref 0.0–1.2)
Total Protein: 6.4 g/dL — ABNORMAL LOW (ref 6.5–8.1)

## 2023-08-30 LAB — TYPE AND SCREEN
ABO/RH(D): O POS
Antibody Screen: NEGATIVE

## 2023-08-30 NOTE — ED Triage Notes (Signed)
 Pt here with rectal bleeding that started today. Pt states the blood was light red, unsure of clots. Pt denies abd pain. Pt denies NVD.

## 2023-08-30 NOTE — ED Provider Notes (Signed)
 [facility] Provider Note    Event Date/Time   First MD Initiated Contact with Patient 08/30/23 1701     (approximate)   History   Rectal Bleeding   HPI  Dwayne Reed is a 68 y.o. male end-stage renal disease, COPD anemia  Patient was at work, he had a bowel movement and when he wiped afterwards noticed blood on the tissue.  He also reports he noticed a little bit of blood seeping to his pants.  He was told on his last colonoscopy if he had any further bleeding he should come back to the ER.  He reports he also had 1 other episode that occurred at the same nature towards the end of May but he did not seek medical care as he was attending his daughter's wedding.  There is no pain no lightheadedness no weakness.  He feels fine now and has not noticed any more bleeding    Known internal hemorrhoids, ademonas, and diverticulosis.      Physical Exam   Triage Vital Signs: ED Triage Vitals  Encounter Vitals Group     BP 08/30/23 1401 115/73     Girls Systolic BP Percentile --      Girls Diastolic BP Percentile --      Boys Systolic BP Percentile --      Boys Diastolic BP Percentile --      Pulse Rate 08/30/23 1400 77     Resp 08/30/23 1400 18     Temp 08/30/23 1400 98.2 F (36.8 C)     Temp Source 08/30/23 1400 Oral     SpO2 08/30/23 1400 98 %     Weight 08/30/23 1401 171 lb 15.3 oz (78 kg)     Height 08/30/23 1401 6' (1.829 m)     Head Circumference --      Peak Flow --      Pain Score 08/30/23 1401 0     Pain Loc --      Pain Education --      Exclude from Growth Chart --     Most recent vital signs: Vitals:   08/30/23 1401 08/30/23 1744  BP: 115/73 (!) 147/80  Pulse:  72  Resp:  16  Temp:    SpO2:  99%     General: Awake, no distress.  CV:  Good peripheral perfusion.  Resp:  Normal effort.  Abd:  No distention.  Other:  Escorted rectal exam by [employer] student Polly Brink.  Small external hemorrhoid no obvious bleeding.  Rectal  exam with brown stool, trace heme positivity Hemoccult.   ED Results / Procedures / Treatments   Labs (all labs ordered are listed, but only abnormal results are displayed) Labs Reviewed  COMPREHENSIVE METABOLIC PANEL WITH GFR - Abnormal; Notable for the following components:      Result Value   Chloride 96 (*)    BUN 45 (*)    Creatinine, Ser 10.26 (*)    Total Protein 6.4 (*)    AST 12 (*)    GFR, Estimated 5 (*)    All other components within normal limits  CBC - Abnormal; Notable for the following components:   RBC 3.14 (*)    Hemoglobin 10.2 (*)    HCT 30.7 (*)    RDW 15.9 (*)    Platelets 132 (*)    All other components within normal limits  POC OCCULT BLOOD, ED  TYPE AND SCREEN   Labs notable for chronic anemia  without acute departure  Chronic renal disease  EKG     RADIOLOGY     PROCEDURES:  Critical Care performed: No  Procedures   MEDICATIONS ORDERED IN ED: Medications - No data to display   IMPRESSION / MDM / ASSESSMENT AND PLAN / ED COURSE  I reviewed the triage vital signs and the nursing notes.                              Differential diagnosis includes, but is not limited to, painless rectal bleeding.  No evidence of major hemorrhage.  Symptoms seem to have resolved.  Slight heme positivity on rectal exam, but no evidence of active bleeding.  Small external hemorrhoid may be a potential source but also noted to have additional hemorrhoids on previous colonoscopy as well as other findings that could lead to rectal bleeding.  He is awake alert well-oriented pleasant ambulatory in no distress.  Hemoglobin stable and he has no features of ongoing bleeding at this time.  Discussed with the patient he will follow-up closely with primary care for which he reports that he is needing to transfer to a new doctor in the same clinic because his previous doctor retired.  Recommended he set up this close follow-up and also made referral to follow-up with  primary care.  Interim careful return precautions discussed with the patient who is in agreement  Patient's presentation is most consistent with acute complicated illness / injury requiring diagnostic workup.   Return precautions and treatment recommendations and follow-up discussed with the patient who is agreeable with the plan.        FINAL CLINICAL IMPRESSION(S) / ED DIAGNOSES   Final diagnoses:  Rectal bleeding  External hemorrhoid     Rx / DC Orders   ED Discharge Orders          Ordered    Ambulatory Referral to Primary Care (Establish Care)       Comments: Needs PCP. Rectal bleeding. Renal disease. Used to see Alayne Allis   08/30/23 1725             Note:  This document was prepared using Dragon voice recognition software and may include unintentional dictation errors.   Iver Marker, MD 08/30/23 2015

## 2023-09-04 ENCOUNTER — Encounter: Payer: Self-pay | Admitting: Podiatry

## 2023-09-04 ENCOUNTER — Ambulatory Visit (INDEPENDENT_AMBULATORY_CARE_PROVIDER_SITE_OTHER): Admitting: Podiatry

## 2023-09-04 DIAGNOSIS — D689 Coagulation defect, unspecified: Secondary | ICD-10-CM

## 2023-09-04 DIAGNOSIS — B351 Tinea unguium: Secondary | ICD-10-CM | POA: Diagnosis not present

## 2023-09-04 DIAGNOSIS — M79675 Pain in left toe(s): Secondary | ICD-10-CM

## 2023-09-04 DIAGNOSIS — L84 Corns and callosities: Secondary | ICD-10-CM

## 2023-09-04 DIAGNOSIS — M79674 Pain in right toe(s): Secondary | ICD-10-CM

## 2023-09-04 DIAGNOSIS — Q828 Other specified congenital malformations of skin: Secondary | ICD-10-CM

## 2023-09-09 ENCOUNTER — Encounter: Payer: Self-pay | Admitting: Podiatry

## 2023-09-09 NOTE — Progress Notes (Signed)
  Subjective:  Patient ID: Dwayne Reed, male    DOB: May 25, 1955,  MRN: [id number]  Dwayne Reed presents to clinic today for at risk foot care with h/o coagulation defect and callus(es) left foot, porokeratotic lesion(s) of both feet, and painful mycotic nails. Painful toenails interfere with ambulation. Aggravating factors include wearing enclosed shoe gear. Pain is relieved with periodic professional debridement. Painful callus(es) and porokeratotic lesion(s) are aggravated when weightbearing with and without shoegear. Pain is relieved with periodic professional debridement.  Chief Complaint  Patient presents with   RFC    Rm4/ RFC not diabetic/ Dr Joshua   New problem(s): None.   PCP is Joshua Cathryne BROCKS, MD (Inactive). LOV 04/11/2023.  Allergies  Allergen Reactions   Cinacalcet  Other (See Comments)    Other reaction(s): Unknown    Review of Systems: Negative except as noted in the HPI.  Objective: No changes noted in today's physical examination. There were no vitals filed for this visit. Dwayne Reed is a pleasant 68 y.o. male WD, WN in NAD. AAO x 3.   Vascular Examination: Capillary refill time immediate b/l. Palpable pedal pulses. Pedal hair present b/l. No pain with calf compression b/l. Skin temperature gradient WNL b/l. No cyanosis or clubbing b/l. No ischemia or gangrene noted b/l. No edema noted b/l LE.  Neurological Examination: Sensation grossly intact b/l with 10 gram monofilament. Vibratory sensation intact b/l.   Dermatological Examination: Pedal skin with normal turgor, texture and tone b/l.  No open wounds. No interdigital macerations.   Toenails 1-5 b/l thick, discolored, elongated with subungual debris and pain on dorsal palpation.   Hyperkeratotic lesion(s) submet head 1 left foot.  No erythema, no edema, no drainage, no fluctuance. Porokeratotic lesion(s) dorsal PIPJ of right second digit and submet head 3 right foot. No erythema, no edema, no drainage,  no fluctuance.  Musculoskeletal Examination: Muscle strength 5/5 to all lower extremity muscle groups bilaterally. HAV with bunion bilaterally and hammertoes 2-5 b/l.  Radiographs: None  Assessment/Plan: 1. Pain due to onychomycosis of toenails of both feet   2. Porokeratosis   3. Callus   4. Coagulation defect, unspecified (HCC)    Patient was evaluated and treated. All patient's and/or POA's questions/concerns addressed on today's visit. Toenails 1-5 debrided in length and girth without incident. Callus(es)/porokeratoses dorsal PIPJ of right second digit, submet head 1 left foot, and submet head 3 right foot pared with sharp debridement without incident. Continue soft, supportive shoe gear daily. Report any pedal injuries to medical professional. Call office if there are any questions/concerns. -Patient/POA to call should there be question/concern in the interim.   Return in about 9 weeks (around 11/06/2023).  Dwayne Reed, DPM      [place] LOCATION: [address] [place] [address]                   Office [phone]   [address] [facility] LOCATION: [address] [place], [place] [address] Office [phone]

## 2023-09-22 ENCOUNTER — Encounter: Payer: Self-pay | Admitting: Family Medicine

## 2023-11-06 ENCOUNTER — Encounter: Payer: Self-pay | Admitting: Podiatry

## 2023-11-06 ENCOUNTER — Ambulatory Visit (INDEPENDENT_AMBULATORY_CARE_PROVIDER_SITE_OTHER): Admitting: Podiatry

## 2023-11-06 DIAGNOSIS — M79675 Pain in left toe(s): Secondary | ICD-10-CM | POA: Diagnosis not present

## 2023-11-06 DIAGNOSIS — D689 Coagulation defect, unspecified: Secondary | ICD-10-CM

## 2023-11-06 DIAGNOSIS — M79674 Pain in right toe(s): Secondary | ICD-10-CM

## 2023-11-06 DIAGNOSIS — Q828 Other specified congenital malformations of skin: Secondary | ICD-10-CM

## 2023-11-06 DIAGNOSIS — B351 Tinea unguium: Secondary | ICD-10-CM | POA: Diagnosis not present

## 2023-11-06 DIAGNOSIS — L84 Corns and callosities: Secondary | ICD-10-CM

## 2023-11-06 NOTE — Progress Notes (Signed)
  Subjective:  Patient ID: Dwayne Reed, male    DOB: [birth year],  MRN: [id number]  Dwayne Reed presents to clinic today for at risk foot care with h/o coagulation defect and callus(es) b/l lower extremities and painful mycotic toenails that are difficult to trim. Painful toenails interfere with ambulation. Aggravating factors include wearing enclosed shoe gear. Pain is relieved with periodic professional debridement. Painful calluses are aggravated when weightbearing with and without shoegear. Pain is relieved with periodic professional debridement.  Chief Complaint  Patient presents with   RFC    Rm2 routine foot care/ Dr. Everitt   New problem(s): None.   PCP is Kotturi, Vinay K, MD.  Allergies  Allergen Reactions   Cinacalcet  Other (See Comments)    Other reaction(s): Unknown    Review of Systems: Negative except as noted in the HPI.  Objective: No changes noted in today's physical examination. There were no vitals filed for this visit. Dwayne Reed is a pleasant 68 y.o. male WD, WN in NAD. AAO x 3.  Vascular Examination: Capillary refill time immediate b/l. Palpable pedal pulses. Pedal hair present b/l. No pain with calf compression b/l. Skin temperature gradient WNL b/l. No cyanosis or clubbing b/l. No ischemia or gangrene noted b/l. No edema noted b/l LE.  Neurological Examination: Sensation grossly intact b/l with 10 gram monofilament. Vibratory sensation intact b/l.   Dermatological Examination: Pedal skin with normal turgor, texture and tone b/l.  No open wounds. No interdigital macerations.   Toenails 1-5 b/l thick, discolored, elongated with subungual debris and pain on dorsal palpation.   Hyperkeratotic lesion(s) submet head 1 left foot.  No erythema, no edema, no drainage, no fluctuance. Porokeratotic lesion(s) dorsal PIPJ of right second digit and submet head 3 right foot. No erythema, no edema, no drainage, no fluctuance.  Musculoskeletal Examination: Muscle  strength 5/5 to all lower extremity muscle groups bilaterally. HAV with bunion bilaterally and hammertoes 2-5 b/l.  Radiographs: None  Assessment/Plan: 1. Pain due to onychomycosis of toenails of both feet   2. Porokeratosis   3. Callus   4. Coagulation defect, unspecified (HCC)   Consent given for treatment. Patient examined. All patient's and/or POA's questions/concerns addressed on today's visit. Mycotic toenails 1-5 debrided in length and girth without incident. Callus(es)/porokeratotic lesion(s) dorsal PIPJ of R 2nd toe, submet head 1 left foot, and submet head 3 right foot pared with sharp debridement without incident.Continue soft, supportive shoe gear daily. Report any pedal injuries to medical professional. Call office if there are any quesitons/concerns.  Return in about 9 weeks (around 01/08/2024).  Dwayne Reed, DPM      [place] LOCATION: [address] [place] [address]                   Office [phone]   [facility] LOCATION: [address] [place], [place] [address] Office [phone]

## 2023-12-20 ENCOUNTER — Ambulatory Visit (INDEPENDENT_AMBULATORY_CARE_PROVIDER_SITE_OTHER)

## 2023-12-20 DIAGNOSIS — Z0000 Encounter for general adult medical examination without abnormal findings: Secondary | ICD-10-CM | POA: Diagnosis not present

## 2023-12-20 NOTE — Progress Notes (Signed)
 Subjective:   Dwayne Reed is a 68 y.o. who presents for a Medicare Wellness preventive visit.  As a reminder, Annual Wellness Visits don't include a physical exam, and some assessments may be limited, especially if this visit is performed virtually. We may recommend an in-person follow-up visit with your provider if needed.  Visit Complete: Virtual I connected with  Dwayne Reed on 12/20/23 by a audio enabled telemedicine application and verified that I am speaking with the correct person using two identifiers.  Patient Location: Home  Provider Location: Office/Clinic  I discussed the limitations of evaluation and management by telemedicine. The patient expressed understanding and agreed to proceed.  Vital Signs: Because this visit was a virtual/telehealth visit, some criteria may be missing or patient reported. Any vitals not documented were not able to be obtained and vitals that have been documented are patient reported.  VideoDeclined- This patient declined Librarian, academic. Therefore the visit was completed with audio only.  Persons Participating in Visit: Patient.  AWV Questionnaire: No: Patient Medicare AWV questionnaire was not completed prior to this visit.  Cardiac Risk Factors include: advanced age (>37men, >42 women);dyslipidemia;male gender;hypertension;sedentary lifestyle;smoking/ tobacco exposure     Objective:    There were no vitals filed for this visit. There is no height or weight on file to calculate BMI.     12/20/2023    4:00 PM 08/30/2023    2:02 PM 03/21/2023   11:24 AM 03/18/2023    9:33 PM 09/12/2022    2:32 PM 12/22/2021   12:40 PM 07/05/2021    9:19 AM  Advanced Directives  Does Patient Have a Medical Advance Directive? No No No No No No No  Would patient like information on creating a medical advance directive? No - Patient declined No - Patient declined No - Patient declined No - Patient declined No - Patient declined       Current Medications (verified) Outpatient Encounter Medications as of 12/20/2023  Medication Sig   AURYXIA  1 GM 210 MG(Fe) tablet Take 3 tablets (630 mg total) by mouth 3 (three) times daily.   B Complex-C-Zn-Folic Acid  (DIALYVITE 800-ZINC  15) 0.8 MG TABS SMARTSIG:1 Tablet(s) By Mouth Every Evening   cholecalciferol (VITAMIN D3) 25 MCG (1000 UNIT) tablet Take 1,000 Units by mouth daily.   Methoxy PEG-Epoetin  Beta (MIRCERA IJ) every 14 (fourteen) days. (Patient not taking: Reported on 12/20/2023)   nicotine  (NICODERM CQ ) 21 mg/24hr patch Place 1 patch (21 mg total) onto the skin daily. (Patient not taking: Reported on 12/20/2023)   rOPINIRole  (REQUIP ) 0.25 MG tablet Take 0.25 mg by mouth at bedtime. (Patient not taking: Reported on 12/20/2023)   tamsulosin  (FLOMAX ) 0.4 MG CAPS capsule Take 1 capsule (0.4 mg total) by mouth daily after supper. (Patient not taking: Reported on 12/20/2023)   No facility-administered encounter medications on file as of 12/20/2023.    Allergies (verified) Cinacalcet    History: Past Medical History:  Diagnosis Date   Anemia    low iron    CHF (congestive heart failure) (HCC)    pt denies   COPD (chronic obstructive pulmonary disease) (HCC)    Dialysis patient    History of kidney stones    Hypertension    Kidney stones    Pneumonia    Renal disorder    ESRD on HD (M,W,F)   Past Surgical History:  Procedure Laterality Date   COLONOSCOPY N/A 03/21/2023   Procedure: COLONOSCOPY;  Surgeon: Jinny Carmine, MD;  Location: [facility] ENDOSCOPY;  Service: Endoscopy;  Laterality: N/A;   CYSTOSCOPY/URETEROSCOPY/HOLMIUM LASER/STENT PLACEMENT N/A 02/25/2020   Procedure: CYSTOSCOPY/URETEROSCOPY/HOLMIUM LASER/STENT PLACEMENT;  Surgeon: Twylla Glendia BROCKS, MD;  Location: [facility] ORS;  Service: Urology;  Laterality: N/A;   CYSTOSCOPY/URETEROSCOPY/HOLMIUM LASER/STENT PLACEMENT Right 03/09/2020   Procedure: CYSTOSCOPY/URETEROSCOPY/HOLMIUM LASER/STENT Exchange;  Surgeon: Twylla Glendia BROCKS, MD;  Location: [facility] ORS;  Service: Urology;  Laterality: Right;   EXTRACORPOREAL SHOCK WAVE LITHOTRIPSY     LITHOTRIPSY     x 5   PROSTATE BIOPSY     Family History  Problem Relation Age of Onset   Hypertension Mother    Heart disease Mother    Sarcoidosis Sister    Cancer Maternal Grandmother        type unknown   Social History   Socioeconomic History   Marital status: Married    Spouse name: Not on file   Number of children: 1   Years of education: Not on file   Highest education level: Not on file  Occupational History   Occupation: admin support speacialist  Tobacco Use   Smoking status: Every Day    Current packs/day: 0.25    Average packs/day: 0.3 packs/day for 30.0 years (7.5 ttl pk-yrs)    Types: Cigarettes   Smokeless tobacco: Never  Vaping Use   Vaping status: Never Used  Substance and Sexual Activity   Alcohol use: Yes    Comment: on holidays   Drug use: Never   Sexual activity: Not Currently  Other Topics Concern   Not on file  Social History Narrative   Not on file   Social Drivers of Health   Financial Resource Strain: Low Risk  (12/20/2023)   Overall Financial Resource Strain (CARDIA)    Difficulty of Paying Living Expenses: Not hard at all  Food Insecurity: No Food Insecurity (12/20/2023)   Hunger Vital Sign    Worried About Running Out of Food in the Last Year: Never true    Ran Out of Food in the Last Year: Never true  Transportation Needs: No Transportation Needs (12/20/2023)   PRAPARE - Administrator, Civil Service (Medical): No    Lack of Transportation (Non-Medical): No  Physical Activity: Insufficiently Active (12/20/2023)   Exercise Vital Sign    Days of Exercise per Week: 3 days    Minutes of Exercise per Session: 30 min  Stress: No Stress Concern Present (12/20/2023)   [employer] of Occupational Health - Occupational Stress Questionnaire    Feeling of Stress: Not at all  Social Connections: Moderately  Integrated (12/20/2023)   Social Connection and Isolation Panel    Frequency of Communication with Friends and Family: Three times a week    Frequency of Social Gatherings with Friends and Family: Three times a week    Attends Religious Services: More than 4 times per year    Active Member of Clubs or Organizations: No    Attends Banker Meetings: Never    Marital Status: Married    Tobacco Counseling Ready to quit: Not Answered Counseling given: Not Answered    Clinical Intake:  Pre-visit preparation completed: Yes  Pain : No/denies pain     BMI - recorded: 23.3 Nutritional Status: BMI of 19-24  Normal Nutritional Risks: None Diabetes: No  No results found for: HGBA1C   How often do you need to have someone help you when you read instructions, pamphlets, or other written materials from your doctor or pharmacy?: 1 - Never  Interpreter Needed?: No  Information entered by :: JHONNIE DAS, LPN   Activities of Daily Living     12/20/2023    4:02 PM  In your present state of health, do you have any difficulty performing the following activities:  Hearing? 0  Vision? 0  Difficulty concentrating or making decisions? 0  Walking or climbing stairs? 0  Dressing or bathing? 0  Doing errands, shopping? 0  Preparing Food and eating ? N  Using the Toilet? N  In the past six months, have you accidently leaked urine? N  Do you have problems with loss of bowel control? N  Managing your Medications? N  Managing your Finances? N  Housekeeping or managing your Housekeeping? N    Patient Care Team: Kotturi, Vinay K, MD as PCP - General (Family Medicine)  I have updated your Care Teams any recent Medical Services you may have received from other providers in the past year.     Assessment:   This is a routine wellness examination for Dwayne Reed.  Hearing/Vision screen Hearing Screening - Comments:: NO AIDS Vision Screening - Comments:: WEARS GLASSES ALL DAY-     Goals Addressed             This Visit's Progress    DIET - EAT MORE FRUITS AND VEGETABLES         Depression Screen     12/20/2023    3:59 PM 04/11/2023    3:10 PM 02/02/2023    1:31 PM 09/12/2022    1:41 PM 03/09/2022    8:50 AM 01/24/2022    1:17 PM 07/20/2021    1:20 PM  PHQ 2/9 Scores  PHQ - 2 Score 0 0 0 0 0 0 0  PHQ- 9 Score 0  0 0 0 0 2    Fall Risk     12/20/2023    4:01 PM 04/11/2023    3:10 PM 02/02/2023    1:31 PM 09/12/2022    1:41 PM 03/09/2022    8:50 AM  Fall Risk   Falls in the past year? 0 0 0 0 0  Number falls in past yr: 0 0 0 0 0  Injury with Fall? 0 0 0 0 0  Risk for fall due to : No Fall Risks History of fall(s) No Fall Risks No Fall Risks No Fall Risks  Follow up Falls evaluation completed;Falls prevention discussed Falls evaluation completed Falls evaluation completed Falls evaluation completed Falls evaluation completed      Data saved with a previous flowsheet row definition    MEDICARE RISK AT HOME:  Medicare Risk at Home Any stairs in or around the home?: Yes If so, are there any without handrails?: No Home free of loose throw rugs in walkways, pet beds, electrical cords, etc?: Yes Adequate lighting in your home to reduce risk of falls?: Yes Life alert?: No Use of a cane, walker or w/c?: No Grab bars in the bathroom?: No Shower chair or bench in shower?: No Elevated toilet seat or a handicapped toilet?: No  TIMED UP AND GO:  Was the test performed?  No  Cognitive Function: 6CIT completed        12/20/2023    4:06 PM 09/12/2022    2:34 PM  6CIT Screen  What Year? 0 points 0 points  What month? 0 points 0 points  What time? 0 points 0 points  Count back from 20 0 points 0 points  Months in reverse 0 points 0 points  Repeat phrase 0 points  2 points  Total Score 0 points 2 points    Immunizations Immunization History  Administered Date(s) Administered   Hepatitis B, ADULT 09/11/2017, 11/13/2017, 03/13/2018, 04/13/2018,  06/11/2018, 07/04/2018, 08/08/2018, 12/05/2018   Hepb-cpg 12/14/2019, 12/24/2019, 01/14/2020, 01/16/2020, 02/13/2020, 04/14/2020, 04/18/2020   INFLUENZA, HIGH DOSE SEASONAL PF 12/17/2019, 12/31/2020   Influenza Split 12/13/2011, 12/26/2016   Influenza, Quadrivalent, Recombinant, Inj, Pf 12/14/2021, 01/01/2022, 12/13/2022, 01/05/2023   Influenza,inj,Quad PF,6+ Mos 12/26/2016, 11/13/2018   Influenza,inj,quad, With Preservative 01/15/2014   Influenza,trivalent, recombinat, inj, PF 01/05/2023   Influenza-Unspecified 12/13/2011, 01/15/2014, 12/22/2014, 12/26/2016   Moderna Sars-Covid-2 Vaccination 04/26/2019, 05/22/2019, 12/31/2019   Pneumococcal Conjugate-13 01/19/2018, 03/27/2019   Pneumococcal Polysaccharide-23 04/16/2014, 10/01/2014, 09/13/2019, 10/02/2019    Screening Tests Health Maintenance  Topic Date Due   Zoster Vaccines- Shingrix (1 of 2) Never done   Influenza Vaccine  10/13/2023   COVID-19 Vaccine (4 - 2025-26 season) 11/13/2023   Medicare Annual Wellness (AWV)  12/19/2024   Colonoscopy  03/20/2028   Pneumococcal Vaccine: 50+ Years  Completed   Hepatitis C Screening  Completed   Meningococcal B Vaccine  Aged Out   DTaP/Tdap/Td  Discontinued   Hepatitis B Vaccines 19-59 Average Risk  Discontinued    Health Maintenance Items Addressed: NEEDS FLU, SHINGRIX; UP TO DATE ON COLONOSCOPY  Additional Screening:  Vision Screening: Recommended annual ophthalmology exams for early detection of glaucoma and other disorders of the eye. Is the patient up to date with their annual eye exam?  No  Who is the provider or what is the name of the office in which the patient attends annual eye exams? NO ONE- WILL CALL [facility] TO CHECK ON INSURANCE  Dental Screening: Recommended annual dental exams for proper oral hygiene  Community Resource Referral / Chronic Care Management: CRR required this visit?  No   CCM required this visit?  No   Plan:    I have personally reviewed and  noted the following in the patient's chart:   Medical and social history Use of alcohol, tobacco or illicit drugs  Current medications and supplements including opioid prescriptions. Patient is not currently taking opioid prescriptions. Functional ability and status Nutritional status Physical activity Advanced directives List of other physicians Hospitalizations, surgeries, and ER visits in previous 12 months Vitals Screenings to include cognitive, depression, and falls Referrals and appointments  In addition, I have reviewed and discussed with patient certain preventive protocols, quality metrics, and best practice recommendations. A written personalized care plan for preventive services as well as general preventive health recommendations were provided to patient.   Jhonnie GORMAN Das, LPN   [id number]   After Visit Summary: (MyChart) Due to this being a telephonic visit, the after visit summary with patients personalized plan was offered to patient via MyChart   Notes: Nothing significant to report at this time.

## 2023-12-20 NOTE — Patient Instructions (Addendum)
 Dwayne Reed,  Thank you for taking the time for your Medicare Wellness Visit. I appreciate your continued commitment to your health goals. Please review the care plan we discussed, and feel free to reach out if I can assist you further.  Medicare recommends these wellness visits once per year to help you and your care team stay ahead of potential health issues. These visits are designed to focus on prevention, allowing your provider to concentrate on managing your acute and chronic conditions during your regular appointments.  Please note that Annual Wellness Visits do not include a physical exam. Some assessments may be limited, especially if the visit was conducted virtually. If needed, we may recommend a separate in-person follow-up with your provider.  Ongoing Care Seeing your primary care provider every 3 to 6 months helps us  monitor your health and provide consistent, personalized care.   Referrals If a referral was made during today's visit and you haven't received any updates within two weeks, please contact the referred provider directly to check on the status.  Recommended Screenings:  Health Maintenance  Topic Date Due   Zoster (Shingles) Vaccine (1 of 2) Never done   Flu Shot  10/13/2023   COVID-19 Vaccine (4 - 2025-26 season) 11/13/2023   Medicare Annual Wellness Visit  12/19/2024   Colon Cancer Screening  03/20/2028   Pneumococcal Vaccine for age over 42  Completed   Hepatitis C Screening  Completed   Meningitis B Vaccine  Aged Out   DTaP/Tdap/Td vaccine  Discontinued   Hepatitis B Vaccine  Discontinued     Advance Care Planning is important because it: Ensures you receive medical care that aligns with your values, goals, and preferences. Provides guidance to your family and loved ones, reducing the emotional burden of decision-making during critical moments.  Vision: Annual vision screenings are recommended for early detection of glaucoma, cataracts, and diabetic  retinopathy. These exams can also reveal signs of chronic conditions such as diabetes and high blood pressure.  Dental: Annual dental screenings help detect early signs of oral cancer, gum disease, and other conditions linked to overall health, including heart disease and diabetes.  Please see the attached documents for additional preventive care recommendations.   NEXT AWV 01/01/25 @ 2:00 PM BY PHONE

## 2023-12-25 ENCOUNTER — Encounter: Admitting: Family Medicine

## 2023-12-26 ENCOUNTER — Encounter: Admitting: Family Medicine

## 2024-01-03 ENCOUNTER — Encounter: Admitting: Family Medicine

## 2024-01-08 ENCOUNTER — Ambulatory Visit (INDEPENDENT_AMBULATORY_CARE_PROVIDER_SITE_OTHER): Admitting: Podiatry

## 2024-01-08 ENCOUNTER — Encounter: Payer: Self-pay | Admitting: Podiatry

## 2024-01-08 DIAGNOSIS — Z992 Dependence on renal dialysis: Secondary | ICD-10-CM

## 2024-01-08 DIAGNOSIS — L84 Corns and callosities: Secondary | ICD-10-CM

## 2024-01-08 DIAGNOSIS — M79675 Pain in left toe(s): Secondary | ICD-10-CM

## 2024-01-08 DIAGNOSIS — Q828 Other specified congenital malformations of skin: Secondary | ICD-10-CM

## 2024-01-08 DIAGNOSIS — B351 Tinea unguium: Secondary | ICD-10-CM | POA: Diagnosis not present

## 2024-01-08 DIAGNOSIS — M79674 Pain in right toe(s): Secondary | ICD-10-CM

## 2024-01-08 DIAGNOSIS — N186 End stage renal disease: Secondary | ICD-10-CM | POA: Diagnosis not present

## 2024-01-08 DIAGNOSIS — D689 Coagulation defect, unspecified: Secondary | ICD-10-CM | POA: Diagnosis not present

## 2024-01-12 ENCOUNTER — Other Ambulatory Visit: Payer: Self-pay

## 2024-01-12 ENCOUNTER — Encounter (HOSPITAL_COMMUNITY)
Admission: RE | Disposition: A | Discharge status: Home / Self Care | Payer: Self-pay | Source: Home / Self Care | Attending: Vascular Surgery

## 2024-01-12 ENCOUNTER — Ambulatory Visit (HOSPITAL_COMMUNITY)
Admission: RE | Admit: 2024-01-12 | Discharge: 2024-01-12 | Disposition: A | Discharge status: Home / Self Care | Attending: Vascular Surgery | Admitting: Vascular Surgery

## 2024-01-12 DIAGNOSIS — N186 End stage renal disease: Secondary | ICD-10-CM | POA: Insufficient documentation

## 2024-01-12 DIAGNOSIS — Y832 Surgical operation with anastomosis, bypass or graft as the cause of abnormal reaction of the patient, or of later complication, without mention of misadventure at the time of the procedure: Secondary | ICD-10-CM | POA: Insufficient documentation

## 2024-01-12 DIAGNOSIS — T82858A Stenosis of vascular prosthetic devices, implants and grafts, initial encounter: Secondary | ICD-10-CM | POA: Diagnosis present

## 2024-01-12 DIAGNOSIS — Z992 Dependence on renal dialysis: Secondary | ICD-10-CM | POA: Insufficient documentation

## 2024-01-12 DIAGNOSIS — F17210 Nicotine dependence, cigarettes, uncomplicated: Secondary | ICD-10-CM | POA: Insufficient documentation

## 2024-01-12 DIAGNOSIS — I120 Hypertensive chronic kidney disease with stage 5 chronic kidney disease or end stage renal disease: Secondary | ICD-10-CM | POA: Diagnosis not present

## 2024-01-12 HISTORY — PX: VENOUS ANGIOPLASTY: CATH118376

## 2024-01-12 HISTORY — PX: A/V FISTULAGRAM: CATH118298

## 2024-01-12 MED ORDER — LIDOCAINE HCL (PF) 1 % IJ SOLN
INTRAMUSCULAR | Status: AC
  Filled 2024-01-12: qty 30

## 2024-01-12 MED ORDER — HEPARIN (PORCINE) IN NACL 1000-0.9 UT/500ML-% IV SOLN
INTRAVENOUS | Status: DC | PRN
  Administered 2024-01-12: 500 mL

## 2024-01-12 MED ORDER — IODIXANOL 320 MG/ML IV SOLN
INTRAVENOUS | Status: DC | PRN
Start: 2024-01-12 — End: 2024-01-12
  Administered 2024-01-12: 25 mL via INTRAVENOUS

## 2024-01-12 MED ORDER — LIDOCAINE HCL (PF) 1 % IJ SOLN
INTRAMUSCULAR | Status: DC | PRN
  Administered 2024-01-12: 5 mL

## 2024-01-12 NOTE — Op Note (Signed)
    Patient name: Dwayne Reed MRN: [id number] DOB: [birth year] Sex: male  01/12/2024 Pre-operative Diagnosis: End-stage renal disease with fistula malfunction Post-operative diagnosis:  Same Surgeon:  Fonda FORBES Rim, MD Procedure Performed: 1.  Fistulogram 2.  7 x 60 mm peripheral venoplasty of the cephalic vein   Indications: Patient is a 68 year old male with history of right sided radiocephalic fistula.  He has appreciated low flows at dialysis.  After discussing the risks and benefits of fistulogram in an effort to improve flow, [name] elected to proceed.  Findings:  Widely patent arterial anastomosis Tandem 60% stenoses of the cephalic vein 4 cm from the anastomosis Widely patent outflow.  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   Procedure:  The patient was identified in the holding area and taken to room 8.  The patient was then placed supine on the table and prepped and draped in the usual sterile fashion.  A time out was called.    The case began with micropuncture access of the right radiocephalic fistula in retrograde fashion.  Fistulogram followed.  See results above.  I elected to intervene on the 2 areas of tandem stenoses.  A short 6 French sheath was placed.  A 7 x 60 mm balloon was brought onto the field and laid across both lesions and inflated for 2 minutes.  Follow-up angiography demonstrated excellent result with resolution of flow-limiting stenosis.  Fistula can continue to use for access. Fistula amenable to future interventions.     Fonda FORBES Rim MD [facility] Office: [phone]

## 2024-01-12 NOTE — H&P (Signed)
 Hospital Consult    Reason for Consult: Low-flow and right arm radiocephalic fistula Requesting Physician: Nephrology MRN #:  [id number]  History of Present Illness: This is a 68 y.o. male with end-stage renal disease and previous history of right arm radiocephalic fistula.  The fistula has had low flows at dialysis.  On exam, Amond was doing well.  He had no complaints.  No symptoms of steal syndrome.  Past Medical History:  Diagnosis Date   Anemia    low iron    CHF (congestive heart failure) (HCC)    pt denies   COPD (chronic obstructive pulmonary disease) (HCC)    Dialysis patient    History of kidney stones    Hypertension    Kidney stones    Pneumonia    Renal disorder    ESRD on HD (M,W,F)    Past Surgical History:  Procedure Laterality Date   COLONOSCOPY N/A 03/21/2023   Procedure: COLONOSCOPY;  Surgeon: Jinny Carmine, MD;  Location: [facility] ENDOSCOPY;  Service: Endoscopy;  Laterality: N/A;   CYSTOSCOPY/URETEROSCOPY/HOLMIUM LASER/STENT PLACEMENT N/A 02/25/2020   Procedure: CYSTOSCOPY/URETEROSCOPY/HOLMIUM LASER/STENT PLACEMENT;  Surgeon: Twylla Glendia BROCKS, MD;  Location: [facility] ORS;  Service: Urology;  Laterality: N/A;   CYSTOSCOPY/URETEROSCOPY/HOLMIUM LASER/STENT PLACEMENT Right 03/09/2020   Procedure: CYSTOSCOPY/URETEROSCOPY/HOLMIUM LASER/STENT Exchange;  Surgeon: Twylla Glendia BROCKS, MD;  Location: [facility] ORS;  Service: Urology;  Laterality: Right;   EXTRACORPOREAL SHOCK WAVE LITHOTRIPSY     LITHOTRIPSY     x 5   PROSTATE BIOPSY      Allergies  Allergen Reactions   Cinacalcet  Other (See Comments)    Other reaction(s): Unknown    Prior to Admission medications   Medication Sig Start Date End Date Taking? Authorizing Provider  AURYXIA  1 GM 210 MG(Fe) tablet Take 3 tablets (630 mg total) by mouth 3 (three) times daily. 03/19/19   Cleatus Arlyss RAMAN, MD  B Complex-C-Zn-Folic Acid  (DIALYVITE 800-ZINC  15) 0.8 MG TABS SMARTSIG:1 Tablet(s) By Mouth Every Evening    [provider]  cholecalciferol (VITAMIN D3) 25 MCG (1000 UNIT) tablet Take 1,000 Units by mouth daily.    [provider]  Methoxy PEG-Epoetin  Beta (MIRCERA IJ) every 14 (fourteen) days. Patient not taking: Reported on 12/20/2023 03/18/23 03/16/24  [provider]  nicotine  (NICODERM CQ ) 21 mg/24hr patch Place 1 patch (21 mg total) onto the skin daily. Patient not taking: Reported on 12/20/2023 02/02/23   Cormick Moss Cathryne BROCKS, MD  rOPINIRole  (REQUIP ) 0.25 MG tablet Take 0.25 mg by mouth at bedtime. Patient not taking: Reported on 12/20/2023 02/21/23   [provider]  tamsulosin  (FLOMAX ) 0.4 MG CAPS capsule Take 1 capsule (0.4 mg total) by mouth daily after supper. Patient not taking: Reported on 12/20/2023 03/22/23   Wouk, Devaughn Sayres, MD    Social History   Socioeconomic History   Marital status: Married    Spouse name: Not on file   Number of children: 1   Years of education: Not on file   Highest education level: Not on file  Occupational History   Occupation: admin support speacialist  Tobacco Use   Smoking status: Every Day    Current packs/day: 0.25    Average packs/day: 0.3 packs/day for 30.0 years (7.5 ttl pk-yrs)    Types: Cigarettes   Smokeless tobacco: Never  Vaping Use   Vaping status: Never Used  Substance and Sexual Activity   Alcohol use: Yes    Comment: on holidays   Drug use: Never   Sexual activity: Not Currently  Other Topics Concern   Not on file  Social History Narrative   Not on file   Social Drivers of Health   Financial Resource Strain: Low Risk  (12/20/2023)   Overall Financial Resource Strain (CARDIA)    Difficulty of Paying Living Expenses: Not hard at all  Food Insecurity: No Food Insecurity (12/20/2023)   Hunger Vital Sign    Worried About Running Out of Food in the Last Year: Never true    Ran Out of Food in the Last Year: Never true  Transportation Needs: No Transportation Needs (12/20/2023)   PRAPARE - Therapist, Art (Medical): No    Lack of Transportation (Non-Medical): No  Physical Activity: Insufficiently Active (12/20/2023)   Exercise Vital Sign    Days of Exercise per Week: 3 days    Minutes of Exercise per Session: 30 min  Stress: No Stress Concern Present (12/20/2023)   [employer] of Occupational Health - Occupational Stress Questionnaire    Feeling of Stress: Not at all  Social Connections: Moderately Integrated (12/20/2023)   Social Connection and Isolation Panel    Frequency of Communication with Friends and Family: Three times a week    Frequency of Social Gatherings with Friends and Family: Three times a week    Attends Religious Services: More than 4 times per year    Active Member of Clubs or Organizations: No    Attends Banker Meetings: Never    Marital Status: Married  Catering Manager Violence: Not At Risk (12/20/2023)   Humiliation, Afraid, Rape, and Kick questionnaire    Fear of Current or Ex-Partner: No    Emotionally Abused: No    Physically Abused: No    Sexually Abused: No   Family History  Problem Relation Age of Onset   Hypertension Mother    Heart disease Mother    Sarcoidosis Sister    Cancer Maternal Grandmother        type unknown    ROS: Otherwise negative unless mentioned in HPI  Physical Examination  Vitals:   01/12/24 0902 01/12/24 0914  BP: 106/71 122/74  Pulse: 74 71  Resp: 16 14  Temp:    SpO2: 100% 99%   Body mass index is 23.09 kg/m.  General:  WDWN in NAD Gait: Not observed HENT: WNL, normocephalic Pulmonary: normal non-labored breathing, without Rales, rhonchi,  wheezing Cardiac: regular Abdomen:  soft, NT/ND, no masses Skin: without rashes Vascular Exam/Pulses: 1+ radial, excellent thrill in right arm fistula Extremities: without ischemic changes, without Gangrene , without cellulitis; without open wounds;  Musculoskeletal: no muscle wasting or atrophy  Neurologic: A&O X 3;  No focal weakness  or paresthesias are detected; speech is fluent/normal Psychiatric:  The pt has Normal affect. Lymph:  Unremarkable  CBC    Component Value Date/Time   WBC 4.3 08/30/2023 1403   RBC 3.14 (L) 08/30/2023 1403   HGB 10.2 (L) 08/30/2023 1403   HGB 12.2 (L) 03/09/2022 0942   HCT 30.7 (L) 08/30/2023 1403   HCT 36.6 (L) 03/09/2022 0942   PLT 132 (L) 08/30/2023 1403   PLT 130 (L) 03/09/2022 0942   MCV 97.8 08/30/2023 1403   MCV 98 (H) 03/09/2022 0942   MCH 32.5 08/30/2023 1403   MCHC 33.2 08/30/2023 1403   RDW 15.9 (H) 08/30/2023 1403   RDW 13.6 03/09/2022 0942   LYMPHSABS 0.9 03/09/2022 0942   MONOABS 0.7 03/27/2019 1517   EOSABS 0.4 03/09/2022 0942   BASOSABS 0.0  03/09/2022 0942    BMET    Component Value Date/Time   NA 137 08/30/2023 1403   NA 146 (H) 03/09/2022 0942   K 3.9 08/30/2023 1403   CL 96 (L) 08/30/2023 1403   CO2 27 08/30/2023 1403   GLUCOSE 88 08/30/2023 1403   BUN 45 (H) 08/30/2023 1403   BUN 39 (H) 03/09/2022 0942   CREATININE 10.26 (H) 08/30/2023 1403   CREATININE 7.14 (H) 09/27/2019 1627   CALCIUM  9.3 08/30/2023 1403   GFRNONAA 5 (L) 08/30/2023 1403   GFRAA 5 (L) 07/28/2017 0633    COAGS: Lab Results  Component Value Date   INR 0.9 03/19/2023      ASSESSMENT/PLAN: This is a 69 y.o. male with right arm radiocephalic fistula.  He is having trouble at dialysis due to low flows.    On exam, there was an excellent thrill in the fistula.  I am surprised they are having low flow issues. I think it is reasonable to move forward with fistulogram made an effort to define possible inflow stenosis.  After discussing risks and benefits of fistulogram in an effort to define improve flow for continued dialysis, Ashten elected to proceed.   Fonda FORBES Rim MD MS [facility] [phone] 01/12/2024  11:00 AM

## 2024-01-12 NOTE — Consult Note (Signed)
 Hospital Consult    Reason for Consult: Low-flow and right arm radiocephalic fistula Requesting Physician: Nephrology MRN #:  [id number]  History of Present Illness: This is a 68 y.o. male with end-stage renal disease and previous history of right arm radiocephalic fistula.  The fistula has had low flows at dialysis.  On exam, Dwayne Reed was doing well.  He had no complaints.  No symptoms of steal syndrome.  Past Medical History:  Diagnosis Date   Anemia    low iron    CHF (congestive heart failure) (HCC)    pt denies   COPD (chronic obstructive pulmonary disease) (HCC)    Dialysis patient    History of kidney stones    Hypertension    Kidney stones    Pneumonia    Renal disorder    ESRD on HD (M,W,F)    Past Surgical History:  Procedure Laterality Date   COLONOSCOPY N/A 03/21/2023   Procedure: COLONOSCOPY;  Surgeon: Jinny Carmine, MD;  Location: [facility] ENDOSCOPY;  Service: Endoscopy;  Laterality: N/A;   CYSTOSCOPY/URETEROSCOPY/HOLMIUM LASER/STENT PLACEMENT N/A 02/25/2020   Procedure: CYSTOSCOPY/URETEROSCOPY/HOLMIUM LASER/STENT PLACEMENT;  Surgeon: Twylla Glendia BROCKS, MD;  Location: [facility] ORS;  Service: Urology;  Laterality: N/A;   CYSTOSCOPY/URETEROSCOPY/HOLMIUM LASER/STENT PLACEMENT Right 03/09/2020   Procedure: CYSTOSCOPY/URETEROSCOPY/HOLMIUM LASER/STENT Exchange;  Surgeon: Twylla Glendia BROCKS, MD;  Location: [facility] ORS;  Service: Urology;  Laterality: Right;   EXTRACORPOREAL SHOCK WAVE LITHOTRIPSY     LITHOTRIPSY     x 5   PROSTATE BIOPSY      Allergies  Allergen Reactions   Cinacalcet  Other (See Comments)    Other reaction(s): Unknown    Prior to Admission medications   Medication Sig Start Date End Date Taking? Authorizing Provider  AURYXIA  1 GM 210 MG(Fe) tablet Take 3 tablets (630 mg total) by mouth 3 (three) times daily. 03/19/19   Cleatus Arlyss RAMAN, MD  B Complex-C-Zn-Folic Acid  (DIALYVITE 800-ZINC  15) 0.8 MG TABS SMARTSIG:1 Tablet(s) By Mouth Every Evening    [provider]  cholecalciferol (VITAMIN D3) 25 MCG (1000 UNIT) tablet Take 1,000 Units by mouth daily.    [provider]  Methoxy PEG-Epoetin  Beta (MIRCERA IJ) every 14 (fourteen) days. Patient not taking: Reported on 12/20/2023 03/18/23 03/16/24  [provider]  nicotine  (NICODERM CQ ) 21 mg/24hr patch Place 1 patch (21 mg total) onto the skin daily. Patient not taking: Reported on 12/20/2023 02/02/23   Anquan Azzarello Cathryne BROCKS, MD  rOPINIRole  (REQUIP ) 0.25 MG tablet Take 0.25 mg by mouth at bedtime. Patient not taking: Reported on 12/20/2023 02/21/23   [provider]  tamsulosin  (FLOMAX ) 0.4 MG CAPS capsule Take 1 capsule (0.4 mg total) by mouth daily after supper. Patient not taking: Reported on 12/20/2023 03/22/23   Wouk, Devaughn Sayres, MD    Social History   Socioeconomic History   Marital status: Married    Spouse name: Not on file   Number of children: 1   Years of education: Not on file   Highest education level: Not on file  Occupational History   Occupation: admin support speacialist  Tobacco Use   Smoking status: Every Day    Current packs/day: 0.25    Average packs/day: 0.3 packs/day for 30.0 years (7.5 ttl pk-yrs)    Types: Cigarettes   Smokeless tobacco: Never  Vaping Use   Vaping status: Never Used  Substance and Sexual Activity   Alcohol use: Yes    Comment: on holidays   Drug use: Never   Sexual activity: Not Currently  Other Topics Concern   Not on file  Social History Narrative   Not on file   Social Drivers of Health   Financial Resource Strain: Low Risk  (12/20/2023)   Overall Financial Resource Strain (CARDIA)    Difficulty of Paying Living Expenses: Not hard at all  Food Insecurity: No Food Insecurity (12/20/2023)   Hunger Vital Sign    Worried About Running Out of Food in the Last Year: Never true    Ran Out of Food in the Last Year: Never true  Transportation Needs: No Transportation Needs (12/20/2023)   PRAPARE - Therapist, Art (Medical): No    Lack of Transportation (Non-Medical): No  Physical Activity: Insufficiently Active (12/20/2023)   Exercise Vital Sign    Days of Exercise per Week: 3 days    Minutes of Exercise per Session: 30 min  Stress: No Stress Concern Present (12/20/2023)   [employer] of Occupational Health - Occupational Stress Questionnaire    Feeling of Stress: Not at all  Social Connections: Moderately Integrated (12/20/2023)   Social Connection and Isolation Panel    Frequency of Communication with Friends and Family: Three times a week    Frequency of Social Gatherings with Friends and Family: Three times a week    Attends Religious Services: More than 4 times per year    Active Member of Clubs or Organizations: No    Attends Banker Meetings: Never    Marital Status: Married  Catering Manager Violence: Not At Risk (12/20/2023)   Humiliation, Afraid, Rape, and Kick questionnaire    Fear of Current or Ex-Partner: No    Emotionally Abused: No    Physically Abused: No    Sexually Abused: No   Family History  Problem Relation Age of Onset   Hypertension Mother    Heart disease Mother    Sarcoidosis Sister    Cancer Maternal Grandmother        type unknown    ROS: Otherwise negative unless mentioned in HPI  Physical Examination  Vitals:   01/12/24 0725  BP: 101/71  Pulse: 77  Resp: 12  Temp: 97.9 F (36.6 C)  SpO2: 99%   There is no height or weight on file to calculate BMI.  General:  WDWN in NAD Gait: Not observed HENT: WNL, normocephalic Pulmonary: normal non-labored breathing, without Rales, rhonchi,  wheezing Cardiac: regular Abdomen:  soft, NT/ND, no masses Skin: without rashes Vascular Exam/Pulses: 1+ radial, excellent thrill in right arm fistula Extremities: without ischemic changes, without Gangrene , without cellulitis; without open wounds;  Musculoskeletal: no muscle wasting or atrophy  Neurologic: A&O X 3;  No focal  weakness or paresthesias are detected; speech is fluent/normal Psychiatric:  The pt has Normal affect. Lymph:  Unremarkable  CBC    Component Value Date/Time   WBC 4.3 08/30/2023 1403   RBC 3.14 (L) 08/30/2023 1403   HGB 10.2 (L) 08/30/2023 1403   HGB 12.2 (L) 03/09/2022 0942   HCT 30.7 (L) 08/30/2023 1403   HCT 36.6 (L) 03/09/2022 0942   PLT 132 (L) 08/30/2023 1403   PLT 130 (L) 03/09/2022 0942   MCV 97.8 08/30/2023 1403   MCV 98 (H) 03/09/2022 0942   MCH 32.5 08/30/2023 1403   MCHC 33.2 08/30/2023 1403   RDW 15.9 (H) 08/30/2023 1403   RDW 13.6 03/09/2022 0942   LYMPHSABS 0.9 03/09/2022 0942   MONOABS 0.7 03/27/2019 1517   EOSABS 0.4 03/09/2022 0942   BASOSABS  0.0 03/09/2022 0942    BMET    Component Value Date/Time   NA 137 08/30/2023 1403   NA 146 (H) 03/09/2022 0942   K 3.9 08/30/2023 1403   CL 96 (L) 08/30/2023 1403   CO2 27 08/30/2023 1403   GLUCOSE 88 08/30/2023 1403   BUN 45 (H) 08/30/2023 1403   BUN 39 (H) 03/09/2022 0942   CREATININE 10.26 (H) 08/30/2023 1403   CREATININE 7.14 (H) 09/27/2019 1627   CALCIUM  9.3 08/30/2023 1403   GFRNONAA 5 (L) 08/30/2023 1403   GFRAA 5 (L) 07/28/2017 0633    COAGS: Lab Results  Component Value Date   INR 0.9 03/19/2023      ASSESSMENT/PLAN: This is a 68 y.o. male with right arm radiocephalic fistula.  He is having trouble at dialysis due to low flows.    On exam, there was an excellent thrill in the fistula.  I am surprised they are having low flow issues. I think it is reasonable to move forward with fistulogram made an effort to define possible inflow stenosis.  After discussing risks and benefits of fistulogram in an effort to define improve flow for continued dialysis, Locke elected to proceed.   Fonda FORBES Rim MD MS [facility] [phone] 01/12/2024  7:35 AM

## 2024-01-13 ENCOUNTER — Encounter: Payer: Self-pay | Admitting: Podiatry

## 2024-01-13 NOTE — Progress Notes (Signed)
  Subjective:  Patient ID: Dwayne Reed, male    DOB: 23-Nov-1955,  MRN: [id number]  Dwayne Reed presents to clinic today for at risk foot care. Patient has h/o coagulation defect and ESRD on hemodialysis. Patient is seen for callus(es) of both feet, porokeratotic lesion(s) right foot, and painful mycotic nails. Painful toenails interfere with ambulation. Aggravating factors include wearing enclosed shoe gear. Pain is relieved with periodic professional debridement. Painful callus(es) and porokeratotic lesion(s) are aggravated when weightbearing with and without shoegear. Pain is relieved with periodic professional debridement.  Chief Complaint  Patient presents with   Toe Pain    He has a new PCP and can't recall the name. He denies being diabetic   New problem(s): None.   PCP is No primary care provider on file..  Allergies  Allergen Reactions   Cinacalcet  Other (See Comments)    Other reaction(s): Unknown    Review of Systems: Negative except as noted in the HPI.  Objective: No changes noted in today's physical examination. There were no vitals filed for this visit. Dwayne Reed is a pleasant 68 y.o. male WD, WN in NAD. AAO x 3.  Vascular Examination: Capillary refill time immediate b/l. Palpable pedal pulses. Pedal hair present b/l. No pain with calf compression b/l. Skin temperature gradient WNL b/l. No cyanosis or clubbing b/l. No ischemia or gangrene noted b/l. No edema noted b/l LE.  Neurological Examination: Sensation grossly intact b/l with 10 gram monofilament. Vibratory sensation intact b/l.   Dermatological Examination: Pedal skin with normal turgor, texture and tone b/l.  No open wounds. No interdigital macerations.   Toenails 1-5 b/l thick, discolored, elongated with subungual debris and pain on dorsal palpation.   Hyperkeratotic lesion(s) submet head 1 left foot and submet head 5 right foot.  No erythema, no edema, no drainage, no fluctuance. Porokeratotic  lesion(s) submet head 2 right foot No erythema, no edema, no drainage, no fluctuance.  Musculoskeletal Examination: Muscle strength 5/5 to all lower extremity muscle groups bilaterally. HAV with bunion bilaterally and hammertoes 2-5 b/l.  Radiographs: None  Assessment/Plan: 1. Pain due to onychomycosis of toenails of both feet   2. Porokeratosis   3. Callus   4. Coagulation defect, unspecified   5. ESRD on dialysis [facility])   -Patient was evaluated today. All questions/concerns addressed on today's visit. -Patient to continue soft, supportive shoe gear daily. -Toenails 1-5 b/l were debrided in length and girth with sterile nail nippers and dremel without iatrogenic bleeding.  -Callus(es) submet head 1 left foot and submet head 5 right foot pared utilizing sterile scalpel blade without complication or incident. Total number debrided =2. -Porokeratotic lesion(s) submet head 2 right foot pared and enucleated with sterile currette without incident. Total number of lesions debrided=1. -Patient/POA to call should there be question/concern in the interim.   No follow-ups on file.  Dwayne Reed, DPM      [place] LOCATION: [address] [place] [address]                   Office [phone]   [facility] LOCATION: [address] [place], [place] [address] Office [phone]

## 2024-01-15 ENCOUNTER — Encounter (HOSPITAL_COMMUNITY): Payer: Self-pay | Admitting: Vascular Surgery

## 2024-01-17 ENCOUNTER — Ambulatory Visit: Payer: Self-pay

## 2024-01-17 NOTE — Telephone Encounter (Signed)
 Left voice mail

## 2024-01-17 NOTE — Telephone Encounter (Signed)
 FYI Only or Action Required?: FYI only for provider: appointment scheduled on 01/31/2024.  Patient was last seen in primary care on 04/11/2023 by Joshua Cathryne BROCKS, MD.  Called Nurse Triage reporting Hematuria.  Symptoms began today.  Triage Disposition: See Physician Within 24 Hours  Patient/caregiver understands and will follow disposition?: No, refuses disposition             Copied from CRM #[id number]. Topic: Clinical - Red Word Triage >> Jan 17, 2024 10:37 AM Sophia H wrote: Red Word that prompted transfer to Nurse Triage: Blood in urine - current dialysis patient & just noticed this morning. Reason for Disposition  Pain or burning with passing urine  Answer Assessment - Initial Assessment Questions Pt refused going to urgent care. PT has TOC appointment on 11/19 and no earlier appointments available. This RN educated pt on new-worsening symptoms, and when to call back/seek emergent care.   Dialysis pt Intermittent urge to urinate at times per pt Slight blood in urine this morning; pt states he had a kidney stone hat was unable to be removed   COLOR of URINE: Describe the color of the urine.  (e.g., tea-colored, pink, red, bloody) Do you have blood clots in your urine? (e.g., none, pea, grape, small coin)     Dull red  EPISODES: How many times has there been blood in the urine? or How many times today?     Once  PAIN with URINATION: Is there any pain with passing your urine? If Yes, ask: How bad is the pain?  (Scale 1-10; or mild, moderate, severe)     Yes when pulsates and has to go, not real bad  FEVER: Do you have a fever? If Yes, ask: What is your temperature, how was it measured, and when did it start?     Denies  OTHER SYMPTOMS: Do you have any other symptoms? (e.g., back/flank pain, abdomen pain)     Denies  Protocols used: Urine - Blood In-A-AH

## 2024-01-25 ENCOUNTER — Encounter: Payer: Self-pay | Admitting: Family Medicine

## 2024-01-25 ENCOUNTER — Ambulatory Visit: Admitting: Family Medicine

## 2024-01-25 VITALS — BP 114/70 | HR 96 | Ht 73.0 in | Wt 166.0 lb

## 2024-01-25 DIAGNOSIS — N281 Cyst of kidney, acquired: Secondary | ICD-10-CM

## 2024-01-25 DIAGNOSIS — J42 Unspecified chronic bronchitis: Secondary | ICD-10-CM

## 2024-01-25 DIAGNOSIS — R972 Elevated prostate specific antigen [PSA]: Secondary | ICD-10-CM

## 2024-01-25 DIAGNOSIS — N132 Hydronephrosis with renal and ureteral calculous obstruction: Secondary | ICD-10-CM

## 2024-01-25 DIAGNOSIS — E213 Hyperparathyroidism, unspecified: Secondary | ICD-10-CM

## 2024-01-25 DIAGNOSIS — N186 End stage renal disease: Secondary | ICD-10-CM

## 2024-01-25 DIAGNOSIS — N200 Calculus of kidney: Secondary | ICD-10-CM

## 2024-01-25 DIAGNOSIS — I10 Essential (primary) hypertension: Secondary | ICD-10-CM | POA: Diagnosis not present

## 2024-01-25 DIAGNOSIS — I770 Arteriovenous fistula, acquired: Secondary | ICD-10-CM | POA: Diagnosis not present

## 2024-01-25 DIAGNOSIS — D631 Anemia in chronic kidney disease: Secondary | ICD-10-CM

## 2024-01-25 DIAGNOSIS — R310 Gross hematuria: Secondary | ICD-10-CM

## 2024-01-25 NOTE — Progress Notes (Signed)
 Established Patient Office Visit  Patient ID: Dwayne Reed, male    DOB: 1955/11/16  Age: 68 y.o. MRN: [id number] PCP: Edmar Blankenburg K, MD  Chief Complaint  Patient presents with   Hematuria    Subjective:     HPI  Discussed the use of AI scribe software for clinical note transcription with the patient, who gave verbal consent to proceed.  History of Present Illness Dwayne Reed is a 68 year old male with end-stage renal disease on dialysis who presents with blood in the urine.  He has experienced hematuria for the past two to three days, describing it as lightly tinged with blood without clots. This occurs occasionally when he has a strong urge to urinate, but sometimes he is unable to produce urine. He associates these symptoms with the movement of a kidney stone, as he has a history of kidney stones that were left in place due to his position in the kidney. He experiences dysuria, particularly when he is able to urinate.  He is on dialysis three times a week (Tuesday, Thursday, and Saturday) and has been for several years due to end-stage renal disease. He is not currently taking any medications other than Orexia and a B complex vitamin. He was recently prescribed Ciprofloxacin  for a possible urinary tract infection, which he plans to start taking the following day.  He has a history of hypertension, which is currently well-controlled without medication. He also has a diagnosis of COPD and continues to smoke, despite previous attempts to quit using patches and chewables. No current breathing difficulties are reported.  He underwent a colonoscopy last year, which revealed polyps that were removed. He has a history of rectal bleeding attributed to hemorrhoids.  He mentions a past finding of a small mass in his kidney, which led to his removal from the kidney transplant list. He is unsure about the status of this mass and whether it is related to a diagnosis of renal cell cancer,  as he was not informed of any cancer diagnosis. He also mentions an elevated prostate level but is unaware of any prostate cancer diagnosis.  No fever, nausea, vomiting, burning sensation during urination, or increased frequency of urination. He reports variable urine output, sometimes producing a lot and other times only a few drops. He is hydrating with two glasses of water  a day.      Review of Systems  All other systems reviewed and are negative.     Objective:     BP 114/70   Pulse 96   Ht 6' 1 (1.854 m)   Wt 166 lb (75.3 kg)   SpO2 98%   BMI 21.90 kg/m     Physical Exam Vitals and nursing note reviewed.  Constitutional:      Appearance: Normal appearance.  HENT:     Head: Normocephalic.     Right Ear: External ear normal.     Left Ear: External ear normal.  Eyes:     Conjunctiva/sclera: Conjunctivae normal.  Cardiovascular:     Rate and Rhythm: Normal rate.  Pulmonary:     Effort: Pulmonary effort is normal. No respiratory distress.  Abdominal:     Palpations: Abdomen is soft.  Musculoskeletal:        General: Normal range of motion.  Skin:    General: Skin is warm.  Neurological:     Mental Status: He is alert and oriented to person, place, and time.  Psychiatric:  Mood and Affect: Mood normal.     Physical Exam     No results found for any visits on 01/25/24.     The 10-year ASCVD risk score (Arnett DK, et al., 2019) is: 15.7%    Assessment & Plan:   Problem List Items Addressed This Visit       Cardiovascular and Mediastinum   Essential hypertension   Acquired arteriovenous fistula     Respiratory   COPD (chronic obstructive pulmonary disease) (HCC)     Endocrine   Hyperparathyroidism     Genitourinary   ESRD on dialysis (HCC) - Primary   Acquired cyst of kidney   Hydronephrosis with renal and ureteral calculus obstruction   Anemia in chronic kidney disease   Other Visit Diagnoses       Elevated PSA       Relevant  Orders   Ambulatory referral to Urology     Gross hematuria       Relevant Orders   Ambulatory referral to Urology       Assessment and Plan Assessment & Plan Gross hematuria Intermittent gross hematuria likely due to recurrent nephrolithiasis. Differential includes nephrolithiasis and possible infection. - Sent urine for culture. - Referred to urology for further evaluation.  End stage renal disease on dialysis with recurrent nephrolithiasis and anemia End stage renal disease managed with dialysis. Recurrent nephrolithiasis. Anemia likely secondary to renal disease. - Continue dialysis as scheduled. - Monitor anemia status, likely managed with iron  supplementation during dialysis.  Arteriovenous fistula (dialysis access) Arteriovenous fistula in place for dialysis access.  Essential hypertension Hypertension well-controlled without medication.  Chronic obstructive pulmonary disease (COPD) COPD with ongoing smoking. Previous cessation attempts unsuccessful. Breathing stable.  Elevated prostate specific antigen (PSA) Elevated PSA with unclear diagnosis of prostate cancer. Previous urology consultation noted. - Referred to urology for evaluation of elevated PSA and potential prostate cancer.    No follow-ups on file.    Vinary K Almon Whitford, MD [facility] at [facility]

## 2024-01-31 ENCOUNTER — Encounter: Admitting: Family Medicine

## 2024-02-21 ENCOUNTER — Ambulatory Visit (INDEPENDENT_AMBULATORY_CARE_PROVIDER_SITE_OTHER): Admitting: Urology

## 2024-02-21 VITALS — BP 135/72 | HR 84 | Ht 72.0 in | Wt 168.1 lb

## 2024-02-21 DIAGNOSIS — C61 Malignant neoplasm of prostate: Secondary | ICD-10-CM

## 2024-02-21 DIAGNOSIS — R310 Gross hematuria: Secondary | ICD-10-CM

## 2024-02-21 NOTE — Progress Notes (Signed)
° °  02/21/2024 10:42 AM   Dwayne Reed [birth year] [id number]  Reason for visit: Follow up low risk prostate cancer on surveillance, new gross hematuria, history of stones  History: Previously followed by multiple urologist including Dr. Nicholaus at [facility] and Dr. Twylla for nephrolithiasis here at [facility] Low volume, low risk prostate cancer diagnosed in 2017 for PSA of 9.4, prostate volume was 66g at that time and opted for active surveillance.  Most recent PSA from 2024 stable at 9 Has ESRD on dialysis, makes minimal urine and voids only a few times per week History of nephrolithiasis requiring ureteroscopy by Dr. Twylla in 2021 Reports an episode of painless gross hematuria about 1 month ago that resolved spontaneously  Physical Exam: BP 135/72 (BP Location: Left Arm, Patient Position: Sitting, Cuff Size: Normal)   Pulse 84   Ht 6' (1.829 m)   Wt 168 lb 1.6 oz (76.2 kg)   SpO2 94%   BMI 22.80 kg/m   Imaging/labs: Reviewed in epic, most recent PSA from May 2024 stable at 9  Today: Reports 1 episode of painless gross hematuria about 1 month ago that resolved spontaneously, urine is back to being clear, denies any flank pain Would like to continue active surveillance for low risk low-volume prostate cancer that has been stable for almost 10 years  Plan:   Low risk prostate cancer: Continue active surveillance, PSA has been stable, can check yearly Gross hematuria: We discussed common possible etiologies of hematuria including BPH, malignancy, urolithiasis, medical renal disease, and idiopathic. Standard workup recommended by the AUA includes imaging with CT urogram to assess the upper tracts, and cystoscopy. Cytology is performed on patient's with gross hematuria to look for malignant cells in the urine.  He defers cystoscopy and would like to start with CT alone.   CT urogram for further evaluation of gross hematuria, call with results, patient defers cystoscopy at this time.   Continue PSA yearly for low risk prostate cancer stable since diagnosis in 2017  I spent 40 total minutes on the day of the encounter including pre-visit review of the medical record, face-to-face time with the patient, and post visit ordering of labs/imaging/tests.  Extensive review of prior urologic records and imaging, prior op notes, prior lab values.   Dwayne JAYSON Burnet, MD  [facility] [address], [address] [place], [place] [address] [phone]

## 2024-02-21 NOTE — Patient Instructions (Signed)
 Please call [phone] or [phone] to schedule your imaging prior to your appointment.

## 2024-02-27 ENCOUNTER — Ambulatory Visit

## 2024-02-27 ENCOUNTER — Ambulatory Visit
Admission: RE | Admit: 2024-02-27 | Discharge: 2024-02-27 | Disposition: A | Discharge status: Home / Self Care | Source: Ambulatory Visit | Attending: Urology | Admitting: Urology

## 2024-02-27 DIAGNOSIS — R310 Gross hematuria: Secondary | ICD-10-CM

## 2024-02-27 MED ORDER — IOHEXOL 300 MG/ML  SOLN
100.0000 mL | Freq: Once | INTRAMUSCULAR | Status: AC | PRN
  Administered 2024-02-27: 15:00:00 100 mL via INTRAVENOUS

## 2024-03-05 ENCOUNTER — Ambulatory Visit: Payer: Self-pay | Admitting: Urology

## 2024-03-05 NOTE — Telephone Encounter (Signed)
-----   Message from Redell Burnet, MD sent at 03/05/2024  7:32 AM EST ----- Prostate and bladder are slightly abnormal on CT, and I would strongly recommend cystoscopy to look in the bladder for completion of gross hematuria workup and rule out any bladder tumors.  There is  also a small lesion on the kidney that requires monitoring.   Please review cystoscopy procedure and schedule cystoscopy, will review CT in more detail at that visit  Redell Burnet, MD 03/05/2024

## 2024-03-05 NOTE — Telephone Encounter (Signed)
Called pt informed him of the information below. Pt voiced understanding. Appt scheduled.

## 2024-03-18 ENCOUNTER — Encounter: Payer: Self-pay | Admitting: Podiatry

## 2024-03-18 ENCOUNTER — Ambulatory Visit: Admitting: Podiatry

## 2024-03-18 DIAGNOSIS — D689 Coagulation defect, unspecified: Secondary | ICD-10-CM

## 2024-03-18 DIAGNOSIS — Q828 Other specified congenital malformations of skin: Secondary | ICD-10-CM

## 2024-03-18 DIAGNOSIS — N186 End stage renal disease: Secondary | ICD-10-CM

## 2024-03-18 DIAGNOSIS — L84 Corns and callosities: Secondary | ICD-10-CM

## 2024-03-18 DIAGNOSIS — D631 Anemia in chronic kidney disease: Secondary | ICD-10-CM

## 2024-03-18 DIAGNOSIS — B351 Tinea unguium: Secondary | ICD-10-CM

## 2024-03-24 ENCOUNTER — Encounter: Payer: Self-pay | Admitting: Podiatry

## 2024-03-24 NOTE — Progress Notes (Signed)
"  °  Subjective:  Patient ID: Dwayne Reed, male    DOB: [birth year],  MRN: [id number]  Dwayne Reed presents to clinic today for at risk foot care with h/o NIDDM with ESRD on hemodialysis and callus(es) b/l lower extremities, porokeratotic lesion(s) right lower extremity, and painful mycotic nails. Painful toenails interfere with ambulation. Aggravating factors include wearing enclosed shoe gear. Pain is relieved with periodic professional debridement. Painful callus(es) and porokeratotic lesion(s) are aggravated when weightbearing with and without shoegear. Pain is relieved with periodic professional debridement.  Chief Complaint  Patient presents with   Nail Problem    Thick painful toenails, 9 week follow up    New problem(s): None.   PCP is Kotturi, Vinay K, MD. Dwayne Reed 01/25/24.  Allergies[1]  Review of Systems: Negative except as noted in the HPI.  Objective: No changes noted in today's physical examination. There were no vitals filed for this visit. Dwayne Reed is a pleasant 69 y.o. male WD, WN in NAD. AAO x 3.  Vascular Examination: Capillary refill time immediate b/l. Palpable pedal pulses. Pedal hair present b/l. No pain with calf compression b/l. Skin temperature gradient WNL b/l. No cyanosis or clubbing b/l. No ischemia or gangrene noted b/l. No edema noted b/l LE.  Neurological Examination: Sensation grossly intact b/l with 10 gram monofilament. Vibratory sensation intact b/l.   Dermatological Examination: Pedal skin with normal turgor, texture and tone b/l.  No open wounds. No interdigital macerations.   Toenails 1-5 b/l thick, discolored, elongated with subungual debris and pain on dorsal palpation.   Hyperkeratotic lesion(s) submet head 1 left foot and submet head 5 right foot.  No erythema, no edema, no drainage, no fluctuance. Porokeratotic lesion(s) submet head 2 right foot No erythema, no edema, no drainage, no fluctuance.  Musculoskeletal Examination: Muscle  strength 5/5 to all lower extremity muscle groups bilaterally. HAV with bunion bilaterally and hammertoes 2-5 b/l.  Radiographs: None  Assessment/Plan: 1. Pain due to onychomycosis of toenails of both feet   2. Porokeratosis   3. Callus   4. ESRD on dialysis (HCC)   5. Anemia in ESRD (end-stage renal disease) (HCC)   6. Coagulation defect, unspecified   -Consent given for treatment as described below: -Examined patient. -Mycotic toenails 1-5 bilaterally were debrided in length and girth with sterile nail nippers and dremel without incident. -Callus(es) submet head 1 left foot and submet head 5 right foot pared utilizing sterile scalpel blade without complication or incident. Total number debrided =2. -Porokeratotic lesion(s) submet head 2 right foot pared and enucleated with sterile currette without incident. Total number of lesions debrided=1. -Patient/POA to call should there be question/concern in the interim.   Return in about 9 weeks (around 05/20/2024).  Dwayne Reed, DPM      [place] LOCATION: [address], [place] [address]                   Office [phone]   [place] LOCATION: [address] [place], [place] [address] Office [phone]     [1]  Allergies Allergen Reactions   Cinacalcet  Other (See Comments)    Other reaction(s): Unknown   "

## 2024-04-10 ENCOUNTER — Other Ambulatory Visit: Payer: Self-pay | Admitting: Urology

## 2024-04-10 ENCOUNTER — Ambulatory Visit (INDEPENDENT_AMBULATORY_CARE_PROVIDER_SITE_OTHER): Admitting: Urology

## 2024-04-10 VITALS — BP 132/76 | HR 78

## 2024-04-10 DIAGNOSIS — R310 Gross hematuria: Secondary | ICD-10-CM | POA: Diagnosis not present

## 2024-04-10 DIAGNOSIS — C61 Malignant neoplasm of prostate: Secondary | ICD-10-CM

## 2024-04-10 DIAGNOSIS — N2889 Other specified disorders of kidney and ureter: Secondary | ICD-10-CM

## 2024-04-10 MED ORDER — CEPHALEXIN 250 MG PO CAPS
500.0000 mg | ORAL_CAPSULE | Freq: Once | ORAL | Status: AC
  Administered 2024-04-10: 500 mg via ORAL

## 2024-04-10 MED ORDER — LIDOCAINE HCL URETHRAL/MUCOSAL 2 % EX GEL
1.0000 | Freq: Once | CUTANEOUS | Status: AC
  Administered 2024-04-10: 1 via URETHRAL

## 2024-04-10 NOTE — Progress Notes (Signed)
 Cystoscopy Procedure Note:  Indication: Gross hematuria  History of low-volume, low risk prostate cancer diagnosed in 2017 for PSA of 9.4, on active surveillance with stable PSA values.  Painless gross hematuria that resolved spontaneously.  ESRD on dialysis, makes minimal urine at baseline  Keflex  given for prophylaxis  After informed consent and discussion of the procedure and its risks, Dwayne Reed was positioned and prepped in the standard fashion. Cystoscopy was performed with a flexible cystoscope. The urethra, bladder neck and entire bladder was visualized in a standard fashion. The prostate was very large with obstructing lateral lobes and large median lobe, vision somewhat limited by bloody urine but no definite tumors or masses.  Cytology sent.  Imaging: CT with significantly enlarged prostate, decompressed bladder, no hydronephrosis.  2.5 cm indeterminate lesion right kidney with mild enhancement\  Findings: No definite tumors, BPH likely source of hematuria  --------------------------------------------------------------  Assessment and Plan: Discussed options including observation, finasteride, embolization.  We also reviewed his 2.5 cm indeterminate lesion right kidney and recommended repeat imaging with MRI in 6 months, but active surveillance very reasonable with his ESRD.  Risks and benefits were discussed.  Trial of finasteride for gross hematuria RTC 6 months MRI abdomen prior for active surveillance 2.5 cm right renal lesion  Dwayne Burnet, MD 04/10/2024

## 2024-04-10 NOTE — Patient Instructions (Signed)
 Please call [phone] or [phone] to schedule your imaging prior to your appointment.

## 2024-04-17 ENCOUNTER — Telehealth: Payer: Self-pay

## 2024-04-17 ENCOUNTER — Other Ambulatory Visit: Payer: Self-pay

## 2024-04-17 DIAGNOSIS — R310 Gross hematuria: Secondary | ICD-10-CM

## 2024-04-17 DIAGNOSIS — C61 Malignant neoplasm of prostate: Secondary | ICD-10-CM

## 2024-04-17 MED ORDER — FINASTERIDE 5 MG PO TABS: 5.0000 mg | ORAL_TABLET | Freq: Every day | ORAL | 3 refills | Status: AC

## 2024-04-17 NOTE — Telephone Encounter (Signed)
 Pt was informed we sent in the finasturide to help with his BPH and hopefully help decrease his hematuria. Pt states it has gotten better today.

## 2024-04-18 ENCOUNTER — Ambulatory Visit: Payer: Self-pay | Admitting: Urology

## 2024-04-18 LAB — "CYTOLOGY PLUS MONITORING PROFILE: PAP & FEULGEN "

## 2024-04-18 NOTE — Telephone Encounter (Signed)
Called pt informed him of the information below. Pt voiced understanding.  

## 2024-04-18 NOTE — Telephone Encounter (Signed)
-----   Message from Redell Burnet, MD sent at 04/18/2024 12:23 PM EST ----- Good news, no evidence of any bladder cancer on urine cytology, keep follow-up as scheduled  Redell Burnet, MD 04/18/2024

## 2024-04-25 ENCOUNTER — Ambulatory Visit: Admitting: Family Medicine

## 2024-04-30 ENCOUNTER — Ambulatory Visit

## 2024-05-23 ENCOUNTER — Ambulatory Visit: Admitting: Podiatry

## 2024-10-08 ENCOUNTER — Ambulatory Visit: Admitting: Urology

## 2025-01-01 ENCOUNTER — Ambulatory Visit

## 2025-01-02 ENCOUNTER — Ambulatory Visit
